# Patient Record
Sex: Male | Born: 1960 | Race: Black or African American | Hispanic: No | Marital: Single | Smoking: Current every day smoker
Health system: Southern US, Community
[De-identification: ages and names within clinical notes are randomized; demographics above are authoritative.]

## PROBLEM LIST (undated history)

## (undated) DIAGNOSIS — M1990 Unspecified osteoarthritis, unspecified site: Secondary | ICD-10-CM

## (undated) DIAGNOSIS — Z8673 Personal history of transient ischemic attack (TIA), and cerebral infarction without residual deficits: Secondary | ICD-10-CM

## (undated) DIAGNOSIS — G8929 Other chronic pain: Secondary | ICD-10-CM

## (undated) DIAGNOSIS — C61 Malignant neoplasm of prostate: Secondary | ICD-10-CM

## (undated) DIAGNOSIS — M4802 Spinal stenosis, cervical region: Secondary | ICD-10-CM

## (undated) DIAGNOSIS — Z91148 Patient's other noncompliance with medication regimen for other reason: Secondary | ICD-10-CM

## (undated) DIAGNOSIS — Z9114 Patient's other noncompliance with medication regimen: Secondary | ICD-10-CM

## (undated) DIAGNOSIS — R350 Frequency of micturition: Secondary | ICD-10-CM

## (undated) DIAGNOSIS — I639 Cerebral infarction, unspecified: Secondary | ICD-10-CM

## (undated) DIAGNOSIS — I10 Essential (primary) hypertension: Secondary | ICD-10-CM

## (undated) DIAGNOSIS — K029 Dental caries, unspecified: Secondary | ICD-10-CM

## (undated) DIAGNOSIS — R51 Headache: Secondary | ICD-10-CM

## (undated) DIAGNOSIS — R519 Headache, unspecified: Secondary | ICD-10-CM

## (undated) HISTORY — PX: PROSTATE BIOPSY: SHX241

## (undated) HISTORY — DX: Cerebral infarction, unspecified: I63.9

## (undated) HISTORY — PX: APPENDECTOMY: SHX54

## (undated) HISTORY — PX: ABDOMINAL EXPLORATION SURGERY: SHX538

---

## 1997-05-20 HISTORY — PX: OTHER SURGICAL HISTORY: SHX169

## 2000-11-06 ENCOUNTER — Emergency Department (HOSPITAL_COMMUNITY): Admission: EM | Admit: 2000-11-06 | Discharge: 2000-11-06 | Payer: Self-pay | Admitting: Emergency Medicine

## 2004-02-06 ENCOUNTER — Emergency Department (HOSPITAL_COMMUNITY): Admission: EM | Admit: 2004-02-06 | Discharge: 2004-02-06 | Payer: Self-pay | Admitting: *Deleted

## 2004-09-20 ENCOUNTER — Emergency Department (HOSPITAL_COMMUNITY): Admission: EM | Admit: 2004-09-20 | Discharge: 2004-09-20 | Payer: Self-pay | Admitting: Emergency Medicine

## 2005-01-13 ENCOUNTER — Emergency Department (HOSPITAL_COMMUNITY): Admission: EM | Admit: 2005-01-13 | Discharge: 2005-01-13 | Payer: Self-pay | Admitting: *Deleted

## 2005-01-17 ENCOUNTER — Emergency Department (HOSPITAL_COMMUNITY): Admission: EM | Admit: 2005-01-17 | Discharge: 2005-01-17 | Payer: Self-pay | Admitting: Emergency Medicine

## 2005-04-16 ENCOUNTER — Emergency Department (HOSPITAL_COMMUNITY): Admission: EM | Admit: 2005-04-16 | Discharge: 2005-04-16 | Payer: Self-pay | Admitting: Emergency Medicine

## 2005-06-11 ENCOUNTER — Emergency Department (HOSPITAL_COMMUNITY): Admission: EM | Admit: 2005-06-11 | Discharge: 2005-06-11 | Payer: Self-pay | Admitting: Family Medicine

## 2005-08-04 ENCOUNTER — Emergency Department (HOSPITAL_COMMUNITY): Admission: EM | Admit: 2005-08-04 | Discharge: 2005-08-04 | Payer: Self-pay | Admitting: Emergency Medicine

## 2009-12-05 ENCOUNTER — Emergency Department (HOSPITAL_COMMUNITY): Admission: EM | Admit: 2009-12-05 | Discharge: 2009-12-05 | Payer: Self-pay | Admitting: Emergency Medicine

## 2012-11-15 ENCOUNTER — Encounter (HOSPITAL_COMMUNITY): Payer: Self-pay | Admitting: Emergency Medicine

## 2012-11-15 ENCOUNTER — Emergency Department (HOSPITAL_COMMUNITY)
Admission: EM | Admit: 2012-11-15 | Discharge: 2012-11-15 | Disposition: A | Discharge status: Home / Self Care | Payer: Self-pay | Attending: Emergency Medicine | Admitting: Emergency Medicine

## 2012-11-15 DIAGNOSIS — L299 Pruritus, unspecified: Secondary | ICD-10-CM

## 2012-11-15 DIAGNOSIS — K921 Melena: Secondary | ICD-10-CM | POA: Insufficient documentation

## 2012-11-15 DIAGNOSIS — K625 Hemorrhage of anus and rectum: Secondary | ICD-10-CM | POA: Insufficient documentation

## 2012-11-15 DIAGNOSIS — M79609 Pain in unspecified limb: Secondary | ICD-10-CM | POA: Insufficient documentation

## 2012-11-15 DIAGNOSIS — F17200 Nicotine dependence, unspecified, uncomplicated: Secondary | ICD-10-CM | POA: Insufficient documentation

## 2012-11-15 LAB — OCCULT BLOOD X 1 CARD TO LAB, STOOL: Fecal Occult Bld: NEGATIVE

## 2012-11-15 MED ORDER — HYDROXYZINE HCL 50 MG PO TABS: 25.0000 mg | ORAL_TABLET | Freq: Four times a day (QID) | ORAL | Status: DC | PRN

## 2012-11-15 MED ORDER — HYDROXYZINE HCL 25 MG PO TABS
50.0000 mg | ORAL_TABLET | Freq: Once | ORAL | Status: AC
  Administered 2012-11-15: 50 mg via ORAL
  Filled 2012-11-15: qty 2

## 2012-11-15 NOTE — ED Notes (Addendum)
Patient states that he stepped on a nail a few days ago. Patient also states that he had blood in his stool x1 this am.

## 2012-11-15 NOTE — ED Provider Notes (Signed)
History    CSN: [id number] Arrival date & time 11/15/12  1820  First MD Initiated Contact with Patient 11/15/12 1953     Chief Complaint  Patient presents with  . Pruritis    entire body  . Foot Pain    stepped on nail with left foot  . Rectal Bleeding   (Consider location/radiation/quality/duration/timing/severity/associated sxs/prior Treatment) HPI Comments: Patient presents complaining of generalized body itching.  He, states he has had a whole body rash for the last 2, weeks, been taking Benadryl, without relief of his itching.  He does work in Aeronautical [employer].  Intermittently.  Painting intermittently.  He also states he stepped on a nail, with his left foot, about a week ago, and he pulled the tick out from under his left arm, he doesn't complain of any, myalgias, headache, dizziness, nausea, chest pain, shortness of breath.  Patient is a 52 y.o. male presenting with lower extremity pain and hematochezia. The history is provided by the patient.  Foot Pain This is a new problem. The current episode started 1 to 4 weeks ago. The problem occurs constantly. The problem has been unchanged. Associated symptoms include a rash. Pertinent negatives include no arthralgias, chest pain, coughing, fever, myalgias, nausea, sore throat or swollen glands. Nothing aggravates the symptoms. Treatments tried: Benadryl. The treatment provided no relief.  Rectal Bleeding Associated symptoms: no dizziness and no fever    History reviewed. No pertinent past medical history. History reviewed. No pertinent past surgical history. No family history on file. History  Substance Use Topics  . Smoking status: Current Every Day Smoker  . Smokeless tobacco: Not on file  . Alcohol Use: Yes    Review of Systems  Constitutional: Negative for fever.  HENT: Negative for sore throat, facial swelling and trouble swallowing.   Respiratory: Negative for cough.   Cardiovascular: Negative for chest pain and leg  swelling.  Gastrointestinal: Positive for hematochezia. Negative for nausea.  Musculoskeletal: Negative for myalgias and arthralgias.  Skin: Positive for rash. Negative for wound.  Neurological: Negative for dizziness.    Allergies  Review of patient's allergies indicates no known allergies.  Home Medications   Current Outpatient Rx  Name  Route  Sig  Dispense  Refill  . diphenhydrAMINE (BENADRYL) 25 mg capsule   Oral   Take 25 mg by mouth at bedtime as needed for sleep.         . hydrOXYzine (ATARAX/VISTARIL) 50 MG tablet   Oral   Take 0.5 tablets (25 mg total) by mouth every 6 (six) hours as needed for itching.   30 tablet   0    BP 158/83  Pulse 96  Temp(Src) 98.6 F (37 C) (Oral)  Resp 20  SpO2 95% Physical Exam  Vitals reviewed. Constitutional: He is oriented to person, place, and time. He appears well-developed and well-nourished.  HENT:  Head: Normocephalic and atraumatic.  Mouth/Throat: Oropharynx is clear and moist.  Eyes: Pupils are equal, round, and reactive to light.  Neck: Neck supple.  Cardiovascular: Normal rate and regular rhythm.   Pulmonary/Chest: Effort normal and breath sounds normal.  Abdominal: Soft. Bowel sounds are normal. He exhibits no distension. There is no tenderness.  Genitourinary: Rectum normal.  Soft stool within the rectal vault.  No obvious blood  Musculoskeletal: Normal range of motion.  There is no evidence of a puncture wound in the bottom at the left or right foot  Neurological: He is oriented to person, place, and time.  Skin: Skin  is warm and dry.  No discernible rash.  The patient has multiple superficial skin irritation from scratching    ED Course  Procedures (including critical care time) Labs Reviewed  OCCULT BLOOD X 1 CARD TO LAB, STOOL   No results found. 1. Itching with irritation     MDM  Will give Atarax for the puritis guaiac test is negative  Arman Filter, NP 11/15/12 2142

## 2012-11-15 NOTE — ED Provider Notes (Signed)
Medical screening examination/treatment/procedure(s) were performed by non-physician practitioner and as supervising physician I was immediately available for consultation/collaboration.   Neeka Urista, MD 11/15/12 2339 

## 2012-11-15 NOTE — ED Notes (Addendum)
Pt reports generalized body itching x 1 -2 weeks. Pt reports stepped on a nail with left foot about 1 week ago. Today pt noticed blood in stool about 1 hour ago. Pt sprayed a persons house for termites about 2 weeks ago. Pt not sure of last tetanus injection. A month ago pt pulled tick from under left arm.

## 2013-03-01 ENCOUNTER — Emergency Department (INDEPENDENT_AMBULATORY_CARE_PROVIDER_SITE_OTHER): Payer: Self-pay

## 2013-03-01 ENCOUNTER — Encounter (HOSPITAL_COMMUNITY): Payer: Self-pay | Admitting: Emergency Medicine

## 2013-03-01 ENCOUNTER — Emergency Department (INDEPENDENT_AMBULATORY_CARE_PROVIDER_SITE_OTHER)
Admission: EM | Admit: 2013-03-01 | Discharge: 2013-03-01 | Disposition: A | Discharge status: Nursing Home (Includes ICF) | Payer: Self-pay | Source: Home / Self Care

## 2013-03-01 ENCOUNTER — Emergency Department (HOSPITAL_COMMUNITY)
Admission: EM | Admit: 2013-03-01 | Discharge: 2013-03-01 | Disposition: A | Discharge status: Home / Self Care | Payer: Self-pay | Attending: Emergency Medicine | Admitting: Emergency Medicine

## 2013-03-01 DIAGNOSIS — R42 Dizziness and giddiness: Secondary | ICD-10-CM | POA: Insufficient documentation

## 2013-03-01 DIAGNOSIS — R0789 Other chest pain: Secondary | ICD-10-CM | POA: Insufficient documentation

## 2013-03-01 DIAGNOSIS — R55 Syncope and collapse: Secondary | ICD-10-CM

## 2013-03-01 DIAGNOSIS — R5381 Other malaise: Secondary | ICD-10-CM | POA: Insufficient documentation

## 2013-03-01 DIAGNOSIS — R079 Chest pain, unspecified: Secondary | ICD-10-CM

## 2013-03-01 DIAGNOSIS — F17200 Nicotine dependence, unspecified, uncomplicated: Secondary | ICD-10-CM | POA: Insufficient documentation

## 2013-03-01 DIAGNOSIS — I498 Other specified cardiac arrhythmias: Secondary | ICD-10-CM | POA: Insufficient documentation

## 2013-03-01 DIAGNOSIS — R51 Headache: Secondary | ICD-10-CM | POA: Insufficient documentation

## 2013-03-01 DIAGNOSIS — R519 Headache, unspecified: Secondary | ICD-10-CM

## 2013-03-01 DIAGNOSIS — Z79899 Other long term (current) drug therapy: Secondary | ICD-10-CM | POA: Insufficient documentation

## 2013-03-01 DIAGNOSIS — I10 Essential (primary) hypertension: Secondary | ICD-10-CM | POA: Insufficient documentation

## 2013-03-01 HISTORY — DX: Essential (primary) hypertension: I10

## 2013-03-01 LAB — MAGNESIUM: Magnesium: 2.2 mg/dL (ref 1.5–2.5)

## 2013-03-01 LAB — POCT I-STAT TROPONIN I: Troponin i, poc: 0 ng/mL (ref 0.00–0.08)

## 2013-03-01 LAB — POCT I-STAT, CHEM 8
BUN: 9 mg/dL (ref 6–23)
Chloride: 102 mEq/L (ref 96–112)
Creatinine, Ser: 1.3 mg/dL (ref 0.50–1.35)
HCT: 50 % (ref 39.0–52.0)
Hemoglobin: 17 g/dL (ref 13.0–17.0)
TCO2: 28 mmol/L (ref 0–100)

## 2013-03-01 LAB — BASIC METABOLIC PANEL
BUN: 9 mg/dL (ref 6–23)
CO2: 30 mEq/L (ref 19–32)
Calcium: 9 mg/dL (ref 8.4–10.5)
Chloride: 99 mEq/L (ref 96–112)
Creatinine, Ser: 1.1 mg/dL (ref 0.50–1.35)
GFR calc Af Amer: 86 mL/min — ABNORMAL LOW (ref >90)
GFR calc non Af Amer: 74 mL/min — ABNORMAL LOW (ref >90)
Glucose, Bld: 109 mg/dL — ABNORMAL HIGH (ref 70–99)
Potassium: 3.8 mEq/L (ref 3.5–5.1)
Sodium: 137 mEq/L (ref 135–145)

## 2013-03-01 LAB — CBC
HCT: 44.4 % (ref 39.0–52.0)
Hemoglobin: 15.9 g/dL (ref 13.0–17.0)
MCH: 33.1 pg (ref 26.0–34.0)
MCHC: 35.8 g/dL (ref 30.0–36.0)
MCV: 92.5 fL (ref 78.0–100.0)
Platelets: 231 10*3/uL (ref 150–400)
RBC: 4.8 MIL/uL (ref 4.22–5.81)
RDW: 13.7 % (ref 11.5–15.5)
WBC: 5.5 10*3/uL (ref 4.0–10.5)

## 2013-03-01 MED ORDER — SODIUM CHLORIDE 0.9 % IV SOLN
Freq: Once | INTRAVENOUS | Status: AC
  Administered 2013-03-01: 11:00:00 via INTRAVENOUS

## 2013-03-01 MED ORDER — SODIUM CHLORIDE 0.9 % IV BOLUS (SEPSIS)
1000.0000 mL | Freq: Once | INTRAVENOUS | Status: AC
  Administered 2013-03-01: 1000 mL via INTRAVENOUS

## 2013-03-01 MED ORDER — DIPHENHYDRAMINE HCL 50 MG/ML IJ SOLN
12.5000 mg | Freq: Once | INTRAMUSCULAR | Status: AC
  Administered 2013-03-01: 12.5 mg via INTRAVENOUS
  Filled 2013-03-01: qty 1

## 2013-03-01 MED ORDER — KETOROLAC TROMETHAMINE 15 MG/ML IJ SOLN
15.0000 mg | Freq: Once | INTRAMUSCULAR | Status: AC
  Administered 2013-03-01: 15 mg via INTRAVENOUS
  Filled 2013-03-01: qty 1

## 2013-03-01 MED ORDER — METOCLOPRAMIDE HCL 5 MG/ML IJ SOLN
10.0000 mg | Freq: Once | INTRAMUSCULAR | Status: AC
  Administered 2013-03-01: 10 mg via INTRAVENOUS
  Filled 2013-03-01: qty 2

## 2013-03-01 NOTE — ED Notes (Signed)
Pt from [facility] for eval of HA, dizziness, bradycardia. Only change is HCTZ started 2 weeks ago. 12 lead unremarkable. 2L O2.  Onset of CP now 4/10.

## 2013-03-01 NOTE — ED Notes (Signed)
Pt triaged and assessed by Provider.  Provider in room before nurse.

## 2013-03-01 NOTE — ED Notes (Signed)
[facility] called , will send truck ASAP

## 2013-03-01 NOTE — ED Provider Notes (Signed)
CSN: [id number]     Arrival date & time 03/01/13  0827 History   None    Chief Complaint  Patient presents with  . Headache   (Consider location/radiation/quality/duration/timing/severity/associated sxs/prior Treatment) HPI Comments: 52 year old male states this morning he awoke and his eyes beginning of father he developed sudden acute headache associated with dizziness and a feeling of impending near syncope. He was started on some new blood pressure medicines 2 weeks ago and he has had similar but less and tense episodes in the morning upon awakening. The medicine he was started on by his PCP was hydrochlorothiazide 25 mg daily. During today's incident he had a 30 to 62nd episode of left anterior chest tightness without radiation. No diaphoresis. He recovered from his symptoms within 5 minutes he states he feels a little weak now.   Past Medical History  Diagnosis Date  . Hypertension    History reviewed. No pertinent past surgical history. History reviewed. No pertinent family history. History  Substance Use Topics  . Smoking status: Current Every Day Smoker -- 0.50 packs/day    Types: Cigarettes  . Smokeless tobacco: Not on file  . Alcohol Use: Yes    Review of Systems  Constitutional: Positive for activity change. Negative for fever and diaphoresis.  Eyes: Positive for visual disturbance.  Respiratory: Positive for chest tightness. Negative for cough, shortness of breath and wheezing.   Cardiovascular: Positive for chest pain. Negative for palpitations and leg swelling.  Gastrointestinal: Negative.   Genitourinary: Negative.   Musculoskeletal:       Chronic left arm pain due to old injury and subsequent surgery.  Skin: Negative.   Neurological: Positive for weakness, light-headedness and headaches. Negative for seizures, syncope and speech difficulty.    Allergies  Review of patient's allergies indicates no known allergies.  Home Medications   Current Outpatient Rx   Name  Route  Sig  Dispense  Refill  . diphenhydrAMINE (BENADRYL) 25 mg capsule   Oral   Take 25 mg by mouth at bedtime as needed for sleep.         . hydrOXYzine (ATARAX/VISTARIL) 50 MG tablet   Oral   Take 0.5 tablets (25 mg total) by mouth every 6 (six) hours as needed for itching.   30 tablet   0    BP 134/85  Pulse 56  Temp(Src) 98.5 F (36.9 C) (Oral)  SpO2 100% Physical Exam  Nursing note and vitals reviewed. Constitutional: He is oriented to person, place, and time. He appears well-developed and well-nourished. No distress.  HENT:  Mouth/Throat: Oropharynx is clear and moist. No oropharyngeal exudate.  Eyes: Conjunctivae and EOM are normal.  Neck: Normal range of motion. Neck supple.  Cardiovascular: Normal rate, regular rhythm and normal heart sounds.   Pulmonary/Chest: Effort normal. He has wheezes.  Scattered wheezes and rhonchi. Patient states he is not having shortness of breath and is in no respiratory distress. He is a smoker not suspect he is at baseline.  Musculoskeletal: He exhibits no edema and no tenderness.  Lymphadenopathy:    He has no cervical adenopathy.  Neurological: He is alert and oriented to person, place, and time. He exhibits normal muscle tone.  Skin: Skin is warm and dry.  Psychiatric: He has a normal mood and affect.    ED Course  Procedures (including critical care time) Labs Review Labs Reviewed  POCT I-STAT, CHEM 8 - Abnormal; Notable for the following:    Glucose, Bld 105 (*)    All  other components within normal limits   Imaging Review Dg Chest 2 View  03/01/2013   CLINICAL DATA:  Chest pain, hypertension, smoker.  EXAM: CHEST  2 VIEW  COMPARISON:  06/11/2005  FINDINGS: The heart size and mediastinal contours are within normal limits. Both lungs are clear. The visualized skeletal structures are unremarkable.  IMPRESSION: No active cardiopulmonary disease.   Electronically Signed   By: Charlett Nose M.D.   On: 03/01/2013 09:17      EKG: Sinus bradycardia at 50 beats per minute. ST elevation in V2, V3, and V5 and V6 most likely due to a strain pattern. MDM   1. Near syncope   2. Chest pain   3. Dizziness      Patient is being transferred to the emergency department for frequent episodes of near syncope where his eyes father of her, develops a headache, dizziness, diaphoresis and chest pain particularly in the morning. He is not found to be orthostatic and is electrolytes are within normal limits. EKG without acute ischemic findings. Etiology of this syndrome is unknown at this time.  Hayden Rasmussen, NP 03/01/13 1026

## 2013-03-01 NOTE — ED Provider Notes (Signed)
Medical screening examination/treatment/procedure(s) were performed by non-physician practitioner and as supervising physician I was immediately available for consultation/collaboration.  Leslee Home, M.D.  Reuben Likes, MD 03/01/13 [phone]

## 2013-03-07 NOTE — ED Provider Notes (Signed)
CSN: [id number]     Arrival date & time 03/01/13  1104 History   First MD Initiated Contact with Patient 03/01/13 1137     Chief Complaint  Patient presents with  . Headache  . Dizziness  . Bradycardia   (Consider location/radiation/quality/duration/timing/severity/associated sxs/prior Treatment) HPI  52yM transfer from urgent care for further evaluation. Pt woke up this morning with a headache. He is currently living in house he is renovating. He initially thought it was from sleeping on hard floor. He went to stand up he began to feel dizziness as if he may pass out and was having some CP. Deep ache in center of chest which last less than a minute then subsided. Did not radiate. No repeat episode. No sob, palpitations or diaphoresis. Since then though he has felt generally tired. Was in usual state of health yesterday. No fever, chills or cough. No n/v. Hx of HTN recently started on HCTZ.   Past Medical History  Diagnosis Date  . Hypertension    No past surgical history on file. No family history on file. History  Substance Use Topics  . Smoking status: Current Every Day Smoker -- 0.50 packs/day    Types: Cigarettes  . Smokeless tobacco: Not on file  . Alcohol Use: Yes    Review of Systems  All systems reviewed and negative, other than as noted in HPI.   Allergies  Review of patient's allergies indicates no known allergies.  Home Medications   Current Outpatient Rx  Name  Route  Sig  Dispense  Refill  . hydrochlorothiazide (HYDRODIURIL) 25 MG tablet   Oral   Take 25 mg by mouth daily.          BP 145/92  Pulse 49  Temp(Src) 97.9 F (36.6 C) (Oral)  Resp 19  SpO2 99% Physical Exam  Nursing note and vitals reviewed. Constitutional: He appears well-developed and well-nourished. No distress.  Laying in bed. NAD.   HENT:  Head: Normocephalic and atraumatic.  Eyes: Conjunctivae are normal. Right eye exhibits no discharge. Left eye exhibits no discharge.  Neck:  Neck supple.  Cardiovascular: Regular rhythm and normal heart sounds.  Exam reveals no gallop and no friction rub.   No murmur heard. bradycardic  Pulmonary/Chest: Effort normal and breath sounds normal. No respiratory distress. He exhibits no tenderness.  Abdominal: Soft. He exhibits no distension. There is no tenderness.  Musculoskeletal: He exhibits no edema and no tenderness.  Lower extremities symmetric as compared to each other. No calf tenderness. Negative Homan's. No palpable cords.   Neurological: He is alert.  Skin: Skin is warm and dry. He is not diaphoretic.  Psychiatric: He has a normal mood and affect. His behavior is normal. Thought content normal.    ED Course  Procedures (including critical care time) Labs Review Labs Reviewed  BASIC METABOLIC PANEL - Abnormal; Notable for the following:    Glucose, Bld 109 (*)    GFR calc non Af Amer 74 (*)    GFR calc Af Amer 86 (*)    All other components within normal limits  CBC  MAGNESIUM  POCT I-STAT TROPONIN I   Imaging Review No results found.  EKG Interpretation   None      EKG:  Rhythm: sinus bradycardia Vent. rate 50 BPM PR interval 136 ms QRS duration 106 ms QT/QTc 440/401 ms P-R-T axes 73 75 62 ST segments: ns st changes   MDM   1. Headache   2. Dizziness  52yM with HA. Suspect primary HA. Consider emergent secondary causes such as bleed, infectious or mass but doubt. There is no history of trauma. Pt has a nonfocal neurological exam. Afebrile and neck supple. No use of blood thinning medication. Consider ocular etiology such as acute angle closure glaucoma but doubt. Pt denies acute change in visual acuity and eye exam unremarkable. Doubt temporal arteritis. No temporal tenderness and temporal artery pulsations palpable. Doubt CO poisoning. No contacts with similar symptoms. Doubt venous thrombosis. Doubt carotid or vertebral arteries dissection. Symptoms improved with meds. Chest pain atypical for  ACS. Laboratory workup was unremarkable. EKG shows bradycardia  without overt ischemic changes. Patient is not sure of what is to call her previous. He is feeling better prior to discharge. He was ambulated were all prior to discharge without any new complaints. I feel that can be safely discharged, but strict return precautions discussed. Outpt fu.     Raeford Razor, MD 03/07/13 [phone]

## 2013-04-30 ENCOUNTER — Encounter (HOSPITAL_COMMUNITY): Payer: Self-pay | Admitting: Emergency Medicine

## 2013-04-30 ENCOUNTER — Emergency Department (HOSPITAL_COMMUNITY): Payer: Self-pay

## 2013-04-30 ENCOUNTER — Emergency Department (HOSPITAL_COMMUNITY)
Admission: EM | Admit: 2013-04-30 | Discharge: 2013-04-30 | Disposition: A | Discharge status: Home / Self Care | Payer: Self-pay | Attending: Emergency Medicine | Admitting: Emergency Medicine

## 2013-04-30 DIAGNOSIS — H81399 Other peripheral vertigo, unspecified ear: Secondary | ICD-10-CM | POA: Insufficient documentation

## 2013-04-30 DIAGNOSIS — Z8739 Personal history of other diseases of the musculoskeletal system and connective tissue: Secondary | ICD-10-CM | POA: Insufficient documentation

## 2013-04-30 DIAGNOSIS — I10 Essential (primary) hypertension: Secondary | ICD-10-CM | POA: Insufficient documentation

## 2013-04-30 DIAGNOSIS — F17200 Nicotine dependence, unspecified, uncomplicated: Secondary | ICD-10-CM | POA: Insufficient documentation

## 2013-04-30 DIAGNOSIS — Z9119 Patient's noncompliance with other medical treatment and regimen: Secondary | ICD-10-CM | POA: Insufficient documentation

## 2013-04-30 DIAGNOSIS — Z79899 Other long term (current) drug therapy: Secondary | ICD-10-CM | POA: Insufficient documentation

## 2013-04-30 DIAGNOSIS — R51 Headache: Secondary | ICD-10-CM | POA: Insufficient documentation

## 2013-04-30 DIAGNOSIS — Z91199 Patient's noncompliance with other medical treatment and regimen due to unspecified reason: Secondary | ICD-10-CM | POA: Insufficient documentation

## 2013-04-30 HISTORY — DX: Unspecified osteoarthritis, unspecified site: M19.90

## 2013-04-30 HISTORY — DX: Patient's other noncompliance with medication regimen for other reason: Z91.148

## 2013-04-30 HISTORY — DX: Patient's other noncompliance with medication regimen: Z91.14

## 2013-04-30 LAB — URINALYSIS W MICROSCOPIC + REFLEX CULTURE
Glucose, UA: NEGATIVE mg/dL
Ketones, ur: NEGATIVE mg/dL
Leukocytes, UA: NEGATIVE
Nitrite: NEGATIVE
Specific Gravity, Urine: 1.018 (ref 1.005–1.030)
pH: 7.5 (ref 5.0–8.0)

## 2013-04-30 LAB — CBC WITH DIFFERENTIAL/PLATELET
Basophils Relative: 1 % (ref 0–1)
Eosinophils Relative: 1 % (ref 0–5)
HCT: 43.9 % (ref 39.0–52.0)
Lymphocytes Relative: 43 % (ref 12–46)
Lymphs Abs: 1.9 10*3/uL (ref 0.7–4.0)
MCV: 93.4 fL (ref 78.0–100.0)
Neutro Abs: 2.2 10*3/uL (ref 1.7–7.7)
Platelets: 233 10*3/uL (ref 150–400)
RBC: 4.7 MIL/uL (ref 4.22–5.81)
RDW: 14.9 % (ref 11.5–15.5)
WBC: 4.5 10*3/uL (ref 4.0–10.5)

## 2013-04-30 LAB — BASIC METABOLIC PANEL
BUN: 11 mg/dL (ref 6–23)
CO2: 25 mEq/L (ref 19–32)
Chloride: 107 mEq/L (ref 96–112)
Creatinine, Ser: 1.04 mg/dL (ref 0.50–1.35)
GFR calc Af Amer: >90 mL/min (ref >90)
GFR calc non Af Amer: 80 mL/min — ABNORMAL LOW (ref >90)
Sodium: 141 mEq/L (ref 135–145)

## 2013-04-30 LAB — TROPONIN I: Troponin I: <0.3 ng/mL (ref <0.30)

## 2013-04-30 MED ORDER — MECLIZINE HCL 25 MG PO TABS: 25.0000 mg | ORAL_TABLET | Freq: Three times a day (TID) | ORAL | Status: DC | PRN

## 2013-04-30 MED ORDER — DIAZEPAM 5 MG PO TABS
5.0000 mg | ORAL_TABLET | Freq: Once | ORAL | Status: AC
  Administered 2013-04-30: 5 mg via ORAL
  Filled 2013-04-30: qty 1

## 2013-04-30 MED ORDER — HYDROCHLOROTHIAZIDE 25 MG PO TABS: 25.0000 mg | ORAL_TABLET | Freq: Every day | ORAL | Status: DC

## 2013-04-30 MED ORDER — MECLIZINE HCL 25 MG PO TABS
50.0000 mg | ORAL_TABLET | Freq: Once | ORAL | Status: AC
  Administered 2013-04-30: 50 mg via ORAL
  Filled 2013-04-30: qty 2

## 2013-04-30 NOTE — ED Notes (Signed)
Pt made aware of need of urine specimen; urinal placed at pt's convienence

## 2013-04-30 NOTE — ED Notes (Signed)
Patient states he got up this am with c/o dizziness and headache , states he was seen one month ago for same and told his b/p was up.

## 2013-04-30 NOTE — ED Notes (Signed)
Pt returned from CT an PBT at bedside.

## 2013-04-30 NOTE — ED Notes (Signed)
Pt undressed, in gown, on continuous pulse oximetry and blood pressure cuff 

## 2013-04-30 NOTE — ED Provider Notes (Signed)
CSN: [id number]     Arrival date & time 04/30/13  1478 History   First MD Initiated Contact with Patient 04/30/13 [phone]     Chief Complaint  Patient presents with  . Headache  . Dizziness    HPI Pt was seen at 0850. Per pt, c/o sudden onset and persistence of constant "dizziness" that began this morning when he woke up. Describes the dizziness as "everything is spinning." Symptoms worsen with moving his head side to side, improve with laying still.  Pt also states he has not been taking his HTN meds in the past 1 month because he "doesn't like the way they make me feel." Cannot further describe what he means. Pt states he was evaluated in the ED 2 months ago for similar symptoms, and was told to take his BP meds. Denies visual changes, no focal motor weakness, no tingling/numbness in extremities, no ataxia, no slurred speech, no facial droop, no CP/palpitations, no SOB/cough, no abd pain, no N/V/D, no fevers, no rash.     Past Medical History  Diagnosis Date  . Hypertension   . Arthritis   . Headache   . Noncompliance with medication regimen    Past Surgical History  Procedure Laterality Date  . Arm surgery Left     History  Substance Use Topics  . Smoking status: Current Every Day Smoker -- 0.50 packs/day    Types: Cigarettes  . Smokeless tobacco: Not on file  . Alcohol Use: Yes    Review of Systems ROS: Statement: All systems negative except as marked or noted in the HPI; Constitutional: Negative for fever and chills. ; ; Eyes: Negative for eye pain, redness and discharge. ; ; ENMT: Negative for ear pain, hoarseness, nasal congestion, sinus pressure and sore throat. ; ; Cardiovascular: Negative for chest pain, palpitations, diaphoresis, dyspnea and peripheral edema. ; ; Respiratory: Negative for cough, wheezing and stridor. ; ; Gastrointestinal: Negative for nausea, vomiting, diarrhea, abdominal pain, blood in stool, hematemesis, jaundice and rectal bleeding. . ; ; Genitourinary:  Negative for dysuria, flank pain and hematuria. ; ; Musculoskeletal: Negative for back pain and neck pain. Negative for swelling and trauma.; ; Skin: Negative for pruritus, rash, abrasions, blisters, bruising and skin lesion.; ; Neuro: +"dizziness." Negative for headache, lightheadedness and neck stiffness. Negative for weakness, altered level of consciousness , altered mental status, extremity weakness, paresthesias, involuntary movement, seizure and syncope.       Allergies  Review of patient's allergies indicates no known allergies.  Home Medications   Current Outpatient Rx  Name  Route  Sig  Dispense  Refill  . hydrochlorothiazide (HYDRODIURIL) 25 MG tablet   Oral   Take 25 mg by mouth daily.          BP 165/88  Pulse 55  Temp(Src) 97.4 F (36.3 C) (Oral)  Resp 17  SpO2 98% Physical Exam 0855: Physical examination:  Nursing notes reviewed; Vital signs and O2 SAT reviewed;  Constitutional: Well developed, Well nourished, Well hydrated, In no acute distress; Head:  Normocephalic, atraumatic; Eyes: EOMI, PERRL, No scleral icterus; ENMT: TM's clear bilat. +edemetous nasal turbinates bilat with clear rhinorrhea. Mouth and pharynx normal, Mucous membranes moist; Neck: Supple, Full range of motion, No lymphadenopathy; Cardiovascular: Regular rate and rhythm, No murmur, rub, or gallop; Respiratory: Breath sounds clear & equal bilaterally, No rales, rhonchi, wheezes.  Speaking full sentences with ease, Normal respiratory effort/excursion; Chest: Nontender, Movement normal; Abdomen: Soft, Nontender, Nondistended, Normal bowel sounds; Genitourinary: No CVA tenderness; Extremities:  Pulses normal, No tenderness, No edema, No calf edema or asymmetry.; Neuro: AA&Ox3, Major CN grossly intact.Speech clear.  No facial droop.  +right horizontal end gaze fatigable nystagmus which reproduces pt's symptoms. Grips equal. Strength 5/5 equal bilat UE's and LE's.  DTR 2/4 equal bilat UE's and LE's.  No gross  sensory deficits.  Normal cerebellar testing bilat UE's (finger-nose) and LE's (heel-shin)..; Skin: Color normal, Warm, Dry.   ED Course  Procedures   EKG Interpretation    Date/Time:  Friday April 30 2013 09:43:31 EST Ventricular Rate:  46 PR Interval:  150 QRS Duration: 94 QT Interval:  442 QTC Calculation: 387 R Axis:   64 Text Interpretation:  Sinus bradycardia Consider left ventricular hypertrophy ST elev, probable normal early repol pattern Since previous tracing 03/01/2013, No significant change was found Confirmed by [facility]  MD, Nicholos Johns [id number]) on 04/30/2013 10:13:52 AM            MDM  MDM Reviewed: previous chart, nursing note and vitals Reviewed previous: labs and ECG Interpretation: labs, ECG, CT scan and x-ray     Results for orders placed during the hospital encounter of 04/30/13  BASIC METABOLIC PANEL      Result Value Range   Sodium 141  135 - 145 mEq/L   Potassium 4.2  3.5 - 5.1 mEq/L   Chloride 107  96 - 112 mEq/L   CO2 25  19 - 32 mEq/L   Glucose, Bld 126 (*) 70 - 99 mg/dL   BUN 11  6 - 23 mg/dL   Creatinine, Ser 9.60  0.50 - 1.35 mg/dL   Calcium 8.6  8.4 - 45.4 mg/dL   GFR calc non Af Amer 80 (*) >90 mL/min   GFR calc Af Amer >90  >90 mL/min  CBC WITH DIFFERENTIAL      Result Value Range   WBC 4.5  4.0 - 10.5 K/uL   RBC 4.70  4.22 - 5.81 MIL/uL   Hemoglobin 15.4  13.0 - 17.0 g/dL   HCT 09.8  11.9 - 14.7 %   MCV 93.4  78.0 - 100.0 fL   MCH 32.8  26.0 - 34.0 pg   MCHC 35.1  30.0 - 36.0 g/dL   RDW 82.9  56.2 - 13.0 %   Platelets 233  150 - 400 K/uL   Neutrophils Relative % 48  43 - 77 %   Neutro Abs 2.2  1.7 - 7.7 K/uL   Lymphocytes Relative 43  12 - 46 %   Lymphs Abs 1.9  0.7 - 4.0 K/uL   Monocytes Relative 7  3 - 12 %   Monocytes Absolute 0.3  0.1 - 1.0 K/uL   Eosinophils Relative 1  0 - 5 %   Eosinophils Absolute 0.1  0.0 - 0.7 K/uL   Basophils Relative 1  0 - 1 %   Basophils Absolute 0.0  0.0 - 0.1 K/uL  TROPONIN I       Result Value Range   Troponin I <0.30  <0.30 ng/mL   Dg Chest 2 View 04/30/2013   CLINICAL DATA:  Chest pain  EXAM: CHEST  2 VIEW  COMPARISON:  03/01/2013  FINDINGS: The heart size and mediastinal contours are within normal limits. Both lungs are clear. The visualized skeletal structures are unremarkable.  IMPRESSION: No active cardiopulmonary disease.   Electronically Signed   By: Salome Holmes M.D.   On: 04/30/2013 09:39   Ct Head Wo Contrast 04/30/2013   CLINICAL DATA:  Dizziness  and headache.  , history of hypertension.  EXAM: CT HEAD WITHOUT CONTRAST  TECHNIQUE: Contiguous axial images were obtained from the base of the skull through the vertex without intravenous contrast.  COMPARISON:  None.  FINDINGS: The ventricles are normal in size and position. There is no intracranial hemorrhage nor intracranial mass effect. There is no evidence of an acute or old ischemic event. There are no abnormal intracranial calcifications. The cerebellum and brainstem are normal in appearance.  At bone window settings the observed portions of the paranasal sinuses and mastoid air cells are clear. There is no acute skull fracture.  IMPRESSION: There is no evidence of an acute intracranial hemorrhage nor of an acute ischemic event. No other acute intracranial abnormality is demonstrated either.   Electronically Signed   By: David  [place]   On: 04/30/2013 09:27    1140:  Pt not orthostatic. Workup reassuring. Neuro exam continues intact. Antivert given without effect. States he continues to feel "dizzy" when attempts to walk with ED staff. Will medicate with valium and order MRI brain r/o CVA.   Mr Brain Wo Contrast 04/30/2013   CLINICAL DATA:  Headache and dizziness  EXAM: MRI HEAD WITHOUT CONTRAST  TECHNIQUE: Multiplanar, multiecho pulse sequences of the brain and surrounding structures were obtained without intravenous contrast.  COMPARISON:  CT 04/30/2013  FINDINGS: Ventricle size is normal. Craniocervical  junction is normal. Pituitary is normal in size.  Negative for acute infarct. No significant chronic ischemic change. Negative for demyelinating disease, cerebral white matter is normal. Brainstem and cerebellum are normal.  Negative for hemorrhage or fluid collection.  No mass or edema.  Paranasal sinuses are clear.  Chronic appearing fracture left medial orbit.  IMPRESSION: Normal MRI of the brain.  Chronic left medial orbital fracture.   Electronically Signed   By: Marlan Palau M.D.   On: 04/30/2013 14:33    1500:  Pt states he feels better after meds and wants to go home now. Pt has now ambulated multiple times around the ED with steady, upright gait, easy resps, NAD. Pt strongly encouraged to take his BP meds. Dx and testing d/w pt.  Questions answered.  Verb understanding, agreeable to d/c home with outpt f/u.   Laray Anger, DO 05/03/13 [phone]

## 2013-04-30 NOTE — ED Notes (Signed)
Pt ambulated down the hall and to the restroom with no difficulty.

## 2013-04-30 NOTE — ED Notes (Signed)
Pt states that when he takes his normal blood pressure pill he feels nauseated. Pt is neurologically intact.

## 2013-04-30 NOTE — ED Notes (Signed)
Pt ambulated from bed to doorway and then began to feel dizzy, pt ambulated back to bed.

## 2013-04-30 NOTE — ED Notes (Signed)
States he used to take b/p medication however it was making him sick went to see his MD was told to take 1/2 pill daily however  Doesn't take it because it makes him sick. No medication in 2 weeks.

## 2013-04-30 NOTE — ED Notes (Signed)
Discharge and follow up instructions reviewed with pt. Pt verbalized understanding.  

## 2013-06-21 ENCOUNTER — Other Ambulatory Visit: Payer: Self-pay | Admitting: Internal Medicine

## 2013-06-21 ENCOUNTER — Ambulatory Visit: Payer: No Typology Code available for payment source | Attending: Internal Medicine

## 2013-06-21 ENCOUNTER — Ambulatory Visit (HOSPITAL_BASED_OUTPATIENT_CLINIC_OR_DEPARTMENT_OTHER): Payer: No Typology Code available for payment source | Admitting: Internal Medicine

## 2013-06-21 ENCOUNTER — Encounter: Payer: Self-pay | Admitting: Internal Medicine

## 2013-06-21 VITALS — BP 171/111 | HR 58 | Temp 97.8°F | Resp 14 | Ht 70.0 in | Wt 224.2 lb

## 2013-06-21 DIAGNOSIS — M25519 Pain in unspecified shoulder: Secondary | ICD-10-CM | POA: Insufficient documentation

## 2013-06-21 DIAGNOSIS — I10 Essential (primary) hypertension: Secondary | ICD-10-CM

## 2013-06-21 LAB — POCT GLYCOSYLATED HEMOGLOBIN (HGB A1C): HEMOGLOBIN A1C: 5.6

## 2013-06-21 MED ORDER — LISINOPRIL 20 MG PO TABS: 20.0000 mg | ORAL_TABLET | Freq: Every day | ORAL | Status: DC

## 2013-06-21 MED ORDER — MELOXICAM 15 MG PO TABS: 15.0000 mg | ORAL_TABLET | Freq: Every day | ORAL | Status: DC

## 2013-06-21 MED ORDER — CYCLOBENZAPRINE HCL 10 MG PO TABS: 10.0000 mg | ORAL_TABLET | Freq: Three times a day (TID) | ORAL | Status: DC | PRN

## 2013-06-21 MED ORDER — HYDROCHLOROTHIAZIDE 25 MG PO TABS: 25.0000 mg | ORAL_TABLET | Freq: Every day | ORAL | Status: DC

## 2013-06-21 NOTE — Progress Notes (Signed)
Pt is here to establish care. Complains of pain in Lt arm, worsens with movement x5 years. No nausea, no vomiting, chronic headaches.

## 2013-06-21 NOTE — Progress Notes (Signed)
Patient ID: Frank Fischer, male   DOB: 03-May-1961, 53 y.o.   MRN: [id number]   CC:  HPI: 53 year old male with a history of hypertension on hydrochlorothiazide, uncontrolled and multiple ED visits for dizziness and headache, had an MRI of the brain on 12/12 that was negative. The patient also complains of left shoulder and left arm pain. He has a history of laceration to the left forearm. Blood pressures in the 258 systolic today, denies chest pain shortness of breath, admits to using marijuana History, smokes a pack a day, drinks alcohol occasionally, smokes marijuana Family history, patient states that his mother has been cured of stage IV lung cancer   No Known Allergies Past Medical History  Diagnosis Date  . Hypertension   . Arthritis   . Headache   . Noncompliance with medication regimen    Current Outpatient Prescriptions on File Prior to Visit  Medication Sig Dispense Refill  . hydrochlorothiazide (HYDRODIURIL) 25 MG tablet Take 25 mg by mouth daily.      . meclizine (ANTIVERT) 25 MG tablet Take 1 tablet (25 mg total) by mouth 3 (three) times daily as needed for dizziness.  15 tablet  0   No current facility-administered medications on file prior to visit.   History reviewed. No pertinent family history. History   Social History  . Marital Status: Single    Spouse Name: N/A    Number of Children: N/A  . Years of Education: N/A   Occupational History  . Not on file.   Social History Main Topics  . Smoking status: Current Every Day Smoker -- 0.50 packs/day    Types: Cigarettes  . Smokeless tobacco: Not on file  . Alcohol Use: Yes  . Drug Use: No  . Sexual Activity: Yes   Other Topics Concern  . Not on file   Social History Narrative  . No narrative on file    Review of Systems  Constitutional: Negative for fever, chills, diaphoresis, activity change, appetite change and fatigue.  HENT: Negative for ear pain, nosebleeds, congestion, facial swelling,  rhinorrhea, neck pain, neck stiffness and ear discharge.   Eyes: Negative for pain, discharge, redness, itching and visual disturbance.  Respiratory: Negative for cough, choking, chest tightness, shortness of breath, wheezing and stridor.   Cardiovascular: Negative for chest pain, palpitations and leg swelling.  Gastrointestinal: Negative for abdominal distention.  Genitourinary: Negative for dysuria, urgency, frequency, hematuria, flank pain, decreased urine volume, difficulty urinating and dyspareunia.  Musculoskeletal: Negative for back pain, joint swelling, arthralgias and gait problem.  Neurological: Negative for dizziness, tremors, seizures, syncope, facial asymmetry, speech difficulty, weakness, light-headedness, numbness and headaches.  Hematological: Negative for adenopathy. Does not bruise/bleed easily.  Psychiatric/Behavioral: Negative for hallucinations, behavioral problems, confusion, dysphoric mood, decreased concentration and agitation.    Objective:   Filed Vitals:   06/21/13 1526  BP: 171/111  Pulse: 58  Temp: 97.8 F (36.6 C)  Resp: 14    Physical Exam  Constitutional: Appears well-developed and well-nourished. No distress.  HENT: Normocephalic. External right and left ear normal. Oropharynx is clear and moist.  Eyes: Conjunctivae and EOM are normal. PERRLA, no scleral icterus.  Neck: Normal ROM. Neck supple. No JVD. No tracheal deviation. No thyromegaly.  CVS: RRR, S1/S2 +, no murmurs, no gallops, no carotid bruit.  Pulmonary: Effort and breath sounds normal, no stridor, rhonchi, wheezes, rales.  Abdominal: Soft. BS +,  no distension, tenderness, rebound or guarding.  Musculoskeletal: Normal range of motion. No edema and no  tenderness.  Lymphadenopathy: No lymphadenopathy noted, cervical, inguinal. Neuro: Alert. Normal reflexes, muscle tone coordination. No cranial nerve deficit. Skin: Skin is warm and dry. No rash noted. Not diaphoretic. No erythema. No pallor.   Psychiatric: Normal mood and affect. Behavior, judgment, thought content normal.   Lab Results  Component Value Date   WBC 4.5 04/30/2013   HGB 15.4 04/30/2013   HCT 43.9 04/30/2013   MCV 93.4 04/30/2013   PLT 233 04/30/2013   Lab Results  Component Value Date   CREATININE 1.04 04/30/2013   BUN 11 04/30/2013   NA 141 04/30/2013   K 4.2 04/30/2013   CL 107 04/30/2013   CO2 25 04/30/2013    No results found for this basename: HGBA1C   Lipid Panel  No results found for this basename: chol, trig, hdl, cholhdl, vldl, ldlcalc       Assessment and plan:   There are no active problems to display for this patient.     Hypertension Uncontrolled Add lisinopril to HCTZ Renal panel  Left shoulder pain Obtain MRI of the left shoulder the neck Sports medicine referral Pain clinic referral Flexeril and meloxicam for pain   Establish care Obtain baseline labs Follow up in 3 months Gastroenterology referral for a colonoscopy     The patient was given clear instructions to go to ER or return to medical center if symptoms don't improve, worsen or new problems develop. The patient verbalized understanding. The patient was told to call to get any lab results if not heard anything in the next week.

## 2013-06-22 ENCOUNTER — Telehealth: Payer: Self-pay | Admitting: *Deleted

## 2013-06-22 LAB — DRUG SCREEN, URINE, NO CONFIRMATION
Amphetamine Screen, Ur: NEGATIVE
Barbiturate Quant, Ur: NEGATIVE
Benzodiazepines.: NEGATIVE
CREATININE, U: 197.4 mg/dL
Cocaine Metabolites: NEGATIVE
METHADONE: NEGATIVE
Marijuana Metabolite: POSITIVE — AB
Opiate Screen, Urine: NEGATIVE
PHENCYCLIDINE (PCP): NEGATIVE
PROPOXYPHENE: NEGATIVE

## 2013-06-22 LAB — CBC WITH DIFFERENTIAL/PLATELET
Basophils Absolute: 0 10*3/uL (ref 0.0–0.1)
Basophils Relative: 0 % (ref 0–1)
Eosinophils Absolute: 0.1 10*3/uL (ref 0.0–0.7)
Eosinophils Relative: 1 % (ref 0–5)
HEMATOCRIT: 44 % (ref 39.0–52.0)
HEMOGLOBIN: 15.2 g/dL (ref 13.0–17.0)
LYMPHS ABS: 2.9 10*3/uL (ref 0.7–4.0)
LYMPHS PCT: 55 % — AB (ref 12–46)
MCH: 31.9 pg (ref 26.0–34.0)
MCHC: 34.5 g/dL (ref 30.0–36.0)
MCV: 92.2 fL (ref 78.0–100.0)
MONO ABS: 0.4 10*3/uL (ref 0.1–1.0)
MONOS PCT: 7 % (ref 3–12)
NEUTROS ABS: 2 10*3/uL (ref 1.7–7.7)
Neutrophils Relative %: 37 % — ABNORMAL LOW (ref 43–77)
Platelets: 276 10*3/uL (ref 150–400)
RBC: 4.77 MIL/uL (ref 4.22–5.81)
RDW: 15 % (ref 11.5–15.5)
WBC: 5.3 10*3/uL (ref 4.0–10.5)

## 2013-06-22 LAB — COMPLETE METABOLIC PANEL WITH GFR
ALBUMIN: 4.9 g/dL (ref 3.5–5.2)
ALT: 18 U/L (ref 0–53)
AST: 15 U/L (ref 0–37)
Alkaline Phosphatase: 77 U/L (ref 39–117)
BUN: 13 mg/dL (ref 6–23)
CO2: 27 mEq/L (ref 19–32)
Calcium: 9.5 mg/dL (ref 8.4–10.5)
Chloride: 106 mEq/L (ref 96–112)
Creat: 0.97 mg/dL (ref 0.50–1.35)
GFR, EST NON AFRICAN AMERICAN: 88 mL/min
GFR, Est African American: >89 mL/min
GLUCOSE: 91 mg/dL (ref 70–99)
POTASSIUM: 4.5 meq/L (ref 3.5–5.3)
Sodium: 140 mEq/L (ref 135–145)
Total Bilirubin: 0.3 mg/dL (ref 0.2–1.2)
Total Protein: 7.1 g/dL (ref 6.0–8.3)

## 2013-06-22 LAB — TSH: TSH: 7.309 u[IU]/mL — ABNORMAL HIGH (ref 0.350–4.500)

## 2013-06-22 NOTE — Telephone Encounter (Signed)
Tried contacting pt. Voicemail is not set up. Unable to leave a voicemail for pt.

## 2013-06-22 NOTE — Telephone Encounter (Signed)
Message copied by Avrie Kedzierski, [place] R on Tue Jun 22, 2013 10:00 AM ------      Message from: Allyson Sabal MD, Ascenc[facility] Dike      Created: Tue Jun 22, 2013  9:10 AM       Notify patient that labs are normal except TSH which is mildly elevated, recommend free T4, free T3 labs, please schedule patient for these labs ASAP ------

## 2013-06-22 NOTE — Telephone Encounter (Signed)
Notify patient that labs are normal except TSH which is mildly elevated, recommend free T4, free T3 labs, please schedule patient for these labs ASAP. Made an appointment for pt to get labs repeated 06/23/13 at 3pm.

## 2013-06-23 ENCOUNTER — Other Ambulatory Visit: Payer: No Typology Code available for payment source

## 2013-06-23 LAB — T4, FREE: Free T4: 0.94 ng/dL (ref 0.80–1.80)

## 2013-06-23 LAB — T3, FREE: T3, Free: 2.7 pg/mL (ref 2.3–4.2)

## 2013-06-28 ENCOUNTER — Telehealth: Payer: Self-pay | Admitting: *Deleted

## 2013-06-28 NOTE — Telephone Encounter (Signed)
Message copied by Kylinn Shropshire, [place] R on Mon Jun 28, 2013 10:27 AM ------      Message from: Allyson Sabal MD, Ascencion Dike      Created: Fri Jun 25, 2013 10:37 AM       Notify patient of the TSH was mildly elevated but the free T4 and T3 is normal. No further recommendations at this time ------

## 2013-07-02 ENCOUNTER — Ambulatory Visit (HOSPITAL_COMMUNITY)
Admission: RE | Admit: 2013-07-02 | Discharge: 2013-07-02 | Disposition: A | Discharge status: Home / Self Care | Payer: No Typology Code available for payment source | Source: Ambulatory Visit | Attending: Internal Medicine | Admitting: Internal Medicine

## 2013-07-02 DIAGNOSIS — I10 Essential (primary) hypertension: Secondary | ICD-10-CM

## 2013-07-02 DIAGNOSIS — M5020 Other cervical disc displacement, unspecified cervical region: Secondary | ICD-10-CM | POA: Insufficient documentation

## 2013-07-02 DIAGNOSIS — IMO0002 Reserved for concepts with insufficient information to code with codable children: Secondary | ICD-10-CM | POA: Insufficient documentation

## 2013-07-02 DIAGNOSIS — M79609 Pain in unspecified limb: Secondary | ICD-10-CM | POA: Insufficient documentation

## 2013-07-02 DIAGNOSIS — M25519 Pain in unspecified shoulder: Secondary | ICD-10-CM | POA: Insufficient documentation

## 2013-07-02 DIAGNOSIS — M4802 Spinal stenosis, cervical region: Secondary | ICD-10-CM | POA: Insufficient documentation

## 2013-07-02 DIAGNOSIS — M19019 Primary osteoarthritis, unspecified shoulder: Secondary | ICD-10-CM | POA: Insufficient documentation

## 2013-07-02 DIAGNOSIS — M47812 Spondylosis without myelopathy or radiculopathy, cervical region: Secondary | ICD-10-CM | POA: Insufficient documentation

## 2013-07-02 DIAGNOSIS — M5030 Other cervical disc degeneration, unspecified cervical region: Secondary | ICD-10-CM | POA: Insufficient documentation

## 2013-07-02 DIAGNOSIS — M542 Cervicalgia: Secondary | ICD-10-CM | POA: Insufficient documentation

## 2013-07-02 DIAGNOSIS — R209 Unspecified disturbances of skin sensation: Secondary | ICD-10-CM | POA: Insufficient documentation

## 2013-07-05 ENCOUNTER — Other Ambulatory Visit: Payer: No Typology Code available for payment source

## 2013-07-07 ENCOUNTER — Ambulatory Visit (INDEPENDENT_AMBULATORY_CARE_PROVIDER_SITE_OTHER): Payer: No Typology Code available for payment source | Admitting: Emergency Medicine

## 2013-07-07 ENCOUNTER — Encounter: Payer: Self-pay | Admitting: Emergency Medicine

## 2013-07-07 VITALS — BP 162/93 | Ht 70.0 in | Wt 224.0 lb

## 2013-07-07 DIAGNOSIS — M719 Bursopathy, unspecified: Secondary | ICD-10-CM

## 2013-07-07 DIAGNOSIS — M67919 Unspecified disorder of synovium and tendon, unspecified shoulder: Secondary | ICD-10-CM

## 2013-07-07 DIAGNOSIS — M4802 Spinal stenosis, cervical region: Secondary | ICD-10-CM | POA: Insufficient documentation

## 2013-07-07 DIAGNOSIS — M7582 Other shoulder lesions, left shoulder: Secondary | ICD-10-CM | POA: Insufficient documentation

## 2013-07-07 MED ORDER — METHYLPREDNISOLONE ACETATE 40 MG/ML IJ SUSP
40.0000 mg | Freq: Once | INTRAMUSCULAR | Status: AC
  Administered 2013-07-07: 40 mg via INTRA_ARTICULAR

## 2013-07-07 NOTE — Addendum Note (Signed)
Addended by: Arnette Schaumann C on: 07/07/2013 03:02 PM   Modules accepted: Orders

## 2013-07-07 NOTE — Assessment & Plan Note (Signed)
I suspect some of his upper extremity pain secondary to spinal stenosis. I feel that he should discuss with his primary care doctor referral to a neurosurgeon for further evaluation.

## 2013-07-07 NOTE — Assessment & Plan Note (Signed)
Corticosteroid injection was given in the office today. In addition he was given a therapy and home exercise program. We'll see him back as needed in 4-6 weeks.

## 2013-07-07 NOTE — Progress Notes (Signed)
Patient ID: Frank Fischer, male   DOB: Apr 18, 1961, 53 y.o.   MRN: [id number] 53 year old male comes in with a complaint of left shoulder pain. Pain radiates down his arm. He also has neck pain. Occasionally has pain in both arms secondary to his neck pain presumably. He has had an MRI of his neck as well as the shoulder. Was referred to see [place] from internal medicine for evaluation of left shoulder pain. He denies any specific injury. He was recently started on Flexeril and Mobic. Mild relief. Pain in her shoulders worse with range of motion.  Pertinent past medical history:  Hypertension, negative for diabetes  Social history: Positive cigarette smoker, positive alcohol  Family history: Hypertension  Examination: BP 162/93  Ht 5\' 10"  (1.778 m)  Wt 224 lb (101.606 kg)  BMI 32.14 kg/m2 Well-developed well-nourished 53 year old [place] American male awake alert oriented no acute distress  Neck: Negative spurling's Full neck range of motion Grip strength and sensation normal in bilateral hands Strength good C4 to T1 distribution No sensory change to C4 to T1 Reflexes normal   Left Shoulder: Inspection reveals no abnormalities, atrophy or asymmetry. Palpation is normal with no tenderness over AC joint or bicipital groove. ROM is limited abduction to 110 degress. Rotator cuff strength limited secondary to pain. Positive Neer and Hawkin's tests, empty can. Speeds and Yergason's tests normal. No labral pathology noted with negative Obrien's, negative clunk and good stability. Normal scapular function observed. Positive painful arc. No apprehension sign  Procedure:  Injection of left subacromial space Consent obtained and verified. Time-out conducted. Noted no overlying erythema, induration, or other signs of local infection. Skin prepped in a sterile fashion. Topical analgesic spray: Ethyl chloride. Completed without difficulty. Meds: 1:3 depomedrol:xylocaine Pain immediately  improved suggesting accurate placement of the medication. Advised to call if fevers/chills, erythema, induration, drainage, or persistent bleeding.  Results of the MRIs were reviewed in the office today. MRI of the shoulders consistent with Rotator cuff tendinitis.  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

## 2013-07-08 ENCOUNTER — Ambulatory Visit: Payer: No Typology Code available for payment source | Attending: Internal Medicine | Admitting: Internal Medicine

## 2013-07-08 ENCOUNTER — Encounter: Payer: Self-pay | Admitting: Internal Medicine

## 2013-07-08 VITALS — BP 145/99 | HR 60 | Temp 98.7°F | Resp 16 | Ht 68.0 in | Wt 222.0 lb

## 2013-07-08 DIAGNOSIS — M5010 Cervical disc disorder with radiculopathy, unspecified cervical region: Secondary | ICD-10-CM

## 2013-07-08 DIAGNOSIS — M5412 Radiculopathy, cervical region: Secondary | ICD-10-CM | POA: Insufficient documentation

## 2013-07-08 DIAGNOSIS — K029 Dental caries, unspecified: Secondary | ICD-10-CM | POA: Insufficient documentation

## 2013-07-08 DIAGNOSIS — M25519 Pain in unspecified shoulder: Secondary | ICD-10-CM | POA: Insufficient documentation

## 2013-07-08 DIAGNOSIS — F17200 Nicotine dependence, unspecified, uncomplicated: Secondary | ICD-10-CM | POA: Insufficient documentation

## 2013-07-08 DIAGNOSIS — I10 Essential (primary) hypertension: Secondary | ICD-10-CM | POA: Insufficient documentation

## 2013-07-08 MED ORDER — LISINOPRIL 40 MG PO TABS: 40.0000 mg | ORAL_TABLET | Freq: Every day | ORAL | Status: DC

## 2013-07-08 MED ORDER — ACETAMINOPHEN-CODEINE #3 300-30 MG PO TABS: 1.0000 | ORAL_TABLET | ORAL | Status: DC | PRN

## 2013-07-08 NOTE — Progress Notes (Signed)
Pt is here today following up on his HTN and chronic left arm pain. Pt reports having frequent headaches.

## 2013-07-08 NOTE — Progress Notes (Signed)
Patient ID: Frank Fischer, male   DOB: Jul 17, 1960, 53 y.o.   MRN: [id number]   Frank Fischer, is a 53 y.o. male  WFU:[id number]  [id number]  DOB - 1960-11-16  Chief Complaint  Patient presents with  . Follow-up        Subjective:   Frank Fischer is a 53 y.o. male here today for a follow up visit. He comes in with a complaint of left shoulder pain. Pain radiates down his arm. He also has neck pain. Occasionally has pain in both arms secondary to his neck pain presumably. He has had an MRI of his neck as well as the shoulder, cervical spine showed some spinal stenosis. He was recently started on Flexeril and Mobic with Mild relief. Pain in his shoulders worse with range of motion. No new complaints, the pain today is the usual pain he has, he thinks he may need something stronger for pain than MOBIC. He smokes half a pack of cigarettes per day and drinks alcohol occasionally. He was seen by the sports medicine recently and was recommended to her for referral to neurosurgery. Patient has No headache, No chest pain, No abdominal pain - No Nausea, No new weakness tingling or numbness, No Cough - SOB.  Problem  Essential Hypertension, Benign  Pain in Joint, Shoulder Region  Cervical Disc Disorder With Radiculopathy of Cervical Region  Dental Caries    ALLERGIES: No Known Allergies  PAST MEDICAL HISTORY: Past Medical History  Diagnosis Date  . Hypertension   . Arthritis   . Headache   . Noncompliance with medication regimen     MEDICATIONS AT HOME: Prior to Admission medications   Medication Sig Start Date End Date Taking? Authorizing Provider  cyclobenzaprine (FLEXERIL) 10 MG tablet Take 1 tablet (10 mg total) by mouth 3 (three) times daily as needed for muscle spasms. 06/21/13  Yes Reyne Dumas, MD  hydrochlorothiazide (HYDRODIURIL) 25 MG tablet Take 1 tablet (25 mg total) by mouth daily. 06/21/13  Yes Reyne Dumas, MD  lisinopril (PRINIVIL,ZESTRIL) 40 MG tablet Take 1 tablet (40  mg total) by mouth daily. 07/08/13  Yes Angelica Chessman, MD  meclizine (ANTIVERT) 25 MG tablet Take 1 tablet (25 mg total) by mouth 3 (three) times daily as needed for dizziness. 04/30/13  Yes Alfonzo Feller, DO  meloxicam (MOBIC) 15 MG tablet Take 1 tablet (15 mg total) by mouth daily. 06/21/13  Yes Reyne Dumas, MD  acetaminophen-codeine (TYLENOL #3) 300-30 MG per tablet Take 1 tablet by mouth every 4 (four) hours as needed. 07/08/13   Angelica Chessman, MD     Objective:   Filed Vitals:   07/08/13 1145 07/08/13 1149  BP: 162/98 145/99  Pulse: 60   Temp: 98.7 F (37.1 C)   TempSrc: Oral   Resp: 16   Height: 5\' 8"  (1.727 m)   Weight: 222 lb (100.699 kg)   SpO2: 96%     Exam General appearance : Awake, alert, not in any distress. Speech Clear. Not toxic looking HEENT: Atraumatic and Normocephalic, pupils equally reactive to light and accomodation Neck: supple, no JVD. No cervical lymphadenopathy.  Chest:Good air entry bilaterally, no added sounds  CVS: S1 S2 regular, no murmurs.  Abdomen: Bowel sounds present, Non tender and not distended with no gaurding, rigidity or rebound. Extremities: Reduced left shoulder range of motion especially abduction. B/L Lower Ext shows no edema, both legs are warm to touch Neurology: Awake alert, and oriented X 3, CN II-XII intact, Non focal Skin:No Rash  Wounds:N/A  Data Review  Assessment & Plan   1. Essential hypertension, benign  - lisinopril (PRINIVIL,ZESTRIL) 40 MG tablet; Take 1 tablet (40 mg total) by mouth daily.  Dispense: 90 tablet; Refill: 3  2. Pain in joint, shoulder region Most likely from tendinitis - acetaminophen-codeine (TYLENOL #3) 300-30 MG per tablet; Take 1 tablet by mouth every 4 (four) hours as needed.  Dispense: 60 tablet; Refill: 0  3. Cervical disc disorder with radiculopathy of cervical region  - Ambulatory referral to Neurosurgery  4. Dental caries  - Ambulatory referral to Dentistry  Patient was  extensively counseled on nutrition and exercise Patient was extensively counseled on smoking cessation  Return in about 3 months (around 10/05/2013), or if symptoms worsen or fail to improve.  The patient was given clear instructions to go to ER or return to medical center if symptoms don't improve, worsen or new problems develop. The patient verbalized understanding. The patient was told to call to get lab results if they haven't heard anything in the next week.    Angelica Chessman, MD, [place], [place], [place] [facility] [place], [place]   07/08/2013, 12:31 PM

## 2013-07-08 NOTE — Patient Instructions (Signed)

## 2013-08-18 ENCOUNTER — Ambulatory Visit (INDEPENDENT_AMBULATORY_CARE_PROVIDER_SITE_OTHER): Payer: No Typology Code available for payment source | Admitting: Emergency Medicine

## 2013-08-18 ENCOUNTER — Telehealth: Payer: Self-pay | Admitting: Internal Medicine

## 2013-08-18 ENCOUNTER — Encounter: Payer: Self-pay | Admitting: Emergency Medicine

## 2013-08-18 VITALS — BP 144/95 | Ht 68.0 in | Wt 222.0 lb

## 2013-08-18 DIAGNOSIS — M5412 Radiculopathy, cervical region: Secondary | ICD-10-CM

## 2013-08-18 DIAGNOSIS — M67919 Unspecified disorder of synovium and tendon, unspecified shoulder: Secondary | ICD-10-CM

## 2013-08-18 DIAGNOSIS — M4802 Spinal stenosis, cervical region: Secondary | ICD-10-CM

## 2013-08-18 DIAGNOSIS — M6790 Unspecified disorder of synovium and tendon, unspecified site: Secondary | ICD-10-CM

## 2013-08-18 DIAGNOSIS — M7582 Other shoulder lesions, left shoulder: Secondary | ICD-10-CM

## 2013-08-18 DIAGNOSIS — M5010 Cervical disc disorder with radiculopathy, unspecified cervical region: Secondary | ICD-10-CM

## 2013-08-18 DIAGNOSIS — M719 Bursopathy, unspecified: Secondary | ICD-10-CM

## 2013-08-18 DIAGNOSIS — M67912 Unspecified disorder of synovium and tendon, left shoulder: Secondary | ICD-10-CM

## 2013-08-18 MED ORDER — PREDNISONE 10 MG PO TABS: ORAL_TABLET | ORAL | Status: DC

## 2013-08-18 NOTE — Assessment & Plan Note (Addendum)
Patient was unable to see a neurosurgeon secondary to financial reasons.  Started on a prednisone Dosepak today. Also given a physical therapy prescription.

## 2013-08-18 NOTE — Telephone Encounter (Signed)
PT. Came in to see doctor, pt. was informed we don't have any appt. available for today. He asked if he could get a refill  (tylenol #3) for his headaches..Pt. also stated he was unable to go to the Neurosurgeon because he cannot afford the $200 copay.

## 2013-08-18 NOTE — Progress Notes (Signed)
   Subjective:    Patient ID: Frank Fischer, male    DOB: [birth year], 53 y.o.   MRN: [id number]  HPI 53 year old male presents for followup of left arm pain secondary to cervical spinal stenosis as well as left shoulder pain secondary to rotator cuff tendinitis both documented by MRI. Today he complains of pain in both regions continue. Corticosteroid injection in the left shoulder does last visit seem to help a little bit but did not incomplete relief of symptoms. He continues to have radicular pain going down into his forearm. He saw his PCP and obtain a referral to a neurosurgeon however, was unable to afford the co-pay the time of the visit and therefore was unable to see the surgeon.  Interval past medical family and social history reviewed and unchanged from prior visit   Review of Systems As per history of present illness otherwise all systems negative    Objective:   Physical Exam  well-developed well-nourished 53 year old [place] male awake alert and oriented no acute distress  Left Shoulder: Inspection reveals no abnormalities, atrophy or asymmetry. Palpation is normal with no tenderness over AC joint or bicipital groove. ROM is slightly limited abduction and forward flexion of the left shoulder Rotator cuff strength normal throughout. Positive signs of impingement with positive Hawkins and Neer Speeds and Yergason's tests normal. No labral pathology noted with negative Obrien's, negative clunk and good stability. Normal scapular function observed. Positive painful ARC No apprehension sign  Cervical spine with no tenderness to palpation.  Positive paraspinal cervical muscle tenderness.      Assessment & Plan:  Patient with continued radicular symptoms secondary to cervical spinal stenosis as well as continued shoulder pain which I suspect some is referred from the spine and some is a component of the rotator cuff tendinopathy. I started him on a prednisone Dosepak  today he notes that this will help temporize/alleviate his symptoms somewhat in addition is given a prescription for physical therapy to help addition to his home exercise routine as well.

## 2013-08-18 NOTE — Assessment & Plan Note (Signed)
Continue home exercise program. Formal physical therapy visit ordered. Continue taking meloxicam. It is too early for repeat injection at this time.

## 2013-08-31 ENCOUNTER — Ambulatory Visit: Payer: No Typology Code available for payment source | Attending: Emergency Medicine | Admitting: Physical Therapy

## 2013-08-31 DIAGNOSIS — M542 Cervicalgia: Secondary | ICD-10-CM | POA: Insufficient documentation

## 2013-08-31 DIAGNOSIS — M25519 Pain in unspecified shoulder: Secondary | ICD-10-CM | POA: Insufficient documentation

## 2013-08-31 DIAGNOSIS — IMO0001 Reserved for inherently not codable concepts without codable children: Secondary | ICD-10-CM | POA: Insufficient documentation

## 2013-09-07 ENCOUNTER — Encounter: Payer: No Typology Code available for payment source | Admitting: Physical Therapy

## 2013-09-08 ENCOUNTER — Ambulatory Visit: Payer: No Typology Code available for payment source | Admitting: Physical Therapy

## 2013-09-09 ENCOUNTER — Encounter: Payer: No Typology Code available for payment source | Admitting: Physical Therapy

## 2013-09-13 ENCOUNTER — Ambulatory Visit: Payer: No Typology Code available for payment source | Admitting: Physical Therapy

## 2013-09-14 ENCOUNTER — Telehealth: Payer: Self-pay | Admitting: *Deleted

## 2013-09-14 NOTE — Telephone Encounter (Signed)
I spoke to the pt and I told him that I would have to call him back this afternoon so I could speak to the doctor about his request.

## 2013-09-15 ENCOUNTER — Ambulatory Visit: Payer: No Typology Code available for payment source | Admitting: Physical Therapy

## 2013-09-15 ENCOUNTER — Encounter: Payer: No Typology Code available for payment source | Admitting: Physical Therapy

## 2013-10-15 ENCOUNTER — Telehealth: Payer: Self-pay | Admitting: Emergency Medicine

## 2013-10-19 ENCOUNTER — Ambulatory Visit: Payer: No Typology Code available for payment source | Attending: Internal Medicine | Admitting: Internal Medicine

## 2013-10-19 ENCOUNTER — Encounter: Payer: Self-pay | Admitting: Internal Medicine

## 2013-10-19 VITALS — BP 150/90 | HR 76 | Temp 98.0°F | Resp 17 | Wt 218.8 lb

## 2013-10-19 DIAGNOSIS — F17200 Nicotine dependence, unspecified, uncomplicated: Secondary | ICD-10-CM

## 2013-10-19 DIAGNOSIS — H579 Unspecified disorder of eye and adnexa: Secondary | ICD-10-CM | POA: Insufficient documentation

## 2013-10-19 DIAGNOSIS — R51 Headache: Secondary | ICD-10-CM

## 2013-10-19 DIAGNOSIS — R519 Headache, unspecified: Secondary | ICD-10-CM | POA: Insufficient documentation

## 2013-10-19 DIAGNOSIS — I10 Essential (primary) hypertension: Secondary | ICD-10-CM

## 2013-10-19 DIAGNOSIS — R030 Elevated blood-pressure reading, without diagnosis of hypertension: Secondary | ICD-10-CM

## 2013-10-19 DIAGNOSIS — L299 Pruritus, unspecified: Secondary | ICD-10-CM

## 2013-10-19 DIAGNOSIS — IMO0001 Reserved for inherently not codable concepts without codable children: Secondary | ICD-10-CM

## 2013-10-19 MED ORDER — CLONIDINE HCL 0.1 MG PO TABS
0.1000 mg | ORAL_TABLET | Freq: Once | ORAL | Status: AC
  Administered 2013-10-19: 0.1 mg via ORAL

## 2013-10-19 MED ORDER — BUTALBITAL-APAP-CAFFEINE 50-325-40 MG PO TABS: 1.0000 | ORAL_TABLET | Freq: Four times a day (QID) | ORAL | Status: DC | PRN

## 2013-10-19 MED ORDER — FEXOFENADINE HCL 180 MG PO TABS: 180.0000 mg | ORAL_TABLET | Freq: Every day | ORAL | Status: DC

## 2013-10-19 MED ORDER — LISINOPRIL 40 MG PO TABS: 40.0000 mg | ORAL_TABLET | Freq: Every day | ORAL | Status: DC

## 2013-10-19 MED ORDER — HYDROCHLOROTHIAZIDE 25 MG PO TABS: 25.0000 mg | ORAL_TABLET | Freq: Every day | ORAL | Status: DC

## 2013-10-19 NOTE — Progress Notes (Signed)
MRN: [id number] Name: Frank Fischer  Sex: male Age: 53 y.o. DOB: 30-Jan-1961  Allergies: Review of patient's allergies indicates no known allergies.  Chief Complaint  Patient presents with  . Follow-up    HPI: Patient is 53 y.o. male who has history of comes today for followup her as per patient he has been taking lisinopril only, EMR reviewed he was prescribed hydrochlorothiazide as well which patient has not been taking his blood pressure was elevated and was given clonidine, patient also has chronic headache had a CAT scan done in December which was negative, patient denies any numbness weakness, patient also reported to have allergy symptoms sometimes itchy eyes as well denies any rash fever chills. Patient also smokes cigarettes, I have advised patient to quit smoking, he's not ready yet. Past Medical History  Diagnosis Date  . Hypertension   . Arthritis   . Headache   . Noncompliance with medication regimen     Past Surgical History  Procedure Laterality Date  . Arm surgery Left       Medication List       This list is accurate as of: 10/19/13 12:53 PM.  Always use your most recent med list.               acetaminophen-codeine 300-30 MG per tablet  Commonly known as:  TYLENOL #3  Take 1 tablet by mouth every 4 (four) hours as needed.     butalbital-acetaminophen-caffeine 50-325-40 MG per tablet  Commonly known as:  ESGIC  Take 1 tablet by mouth every 6 (six) hours as needed for headache.     cyclobenzaprine 10 MG tablet  Commonly known as:  FLEXERIL  Take 1 tablet (10 mg total) by mouth 3 (three) times daily as needed for muscle spasms.     fexofenadine 180 MG tablet  Commonly known as:  ALLEGRA ALLERGY  Take 1 tablet (180 mg total) by mouth daily.     hydrochlorothiazide 25 MG tablet  Commonly known as:  HYDRODIURIL  Take 1 tablet (25 mg total) by mouth daily.     lisinopril 40 MG tablet  Commonly known as:  PRINIVIL,ZESTRIL  Take 1 tablet (40 mg  total) by mouth daily.     meclizine 25 MG tablet  Commonly known as:  ANTIVERT  Take 1 tablet (25 mg total) by mouth 3 (three) times daily as needed for dizziness.     meloxicam 15 MG tablet  Commonly known as:  MOBIC  Take 1 tablet (15 mg total) by mouth daily.     predniSONE 10 MG tablet  Commonly known as:  DELTASONE  Take as directed        Meds ordered this encounter  Medications  . cloNIDine (CATAPRES) tablet 0.1 mg    Sig:   . fexofenadine (ALLEGRA ALLERGY) 180 MG tablet    Sig: Take 1 tablet (180 mg total) by mouth daily.    Dispense:  30 tablet    Refill:  3  . butalbital-acetaminophen-caffeine (ESGIC) 50-325-40 MG per tablet    Sig: Take 1 tablet by mouth every 6 (six) hours as needed for headache.    Dispense:  30 tablet    Refill:  0  . hydrochlorothiazide (HYDRODIURIL) 25 MG tablet    Sig: Take 1 tablet (25 mg total) by mouth daily.    Dispense:  30 tablet    Refill:  6  . lisinopril (PRINIVIL,ZESTRIL) 40 MG tablet    Sig: Take 1 tablet (40  mg total) by mouth daily.    Dispense:  90 tablet    Refill:  3     There is no immunization history on file for this patient.  History reviewed. No pertinent family history.  History  Substance Use Topics  . Smoking status: Current Every Day Smoker -- 0.50 packs/day for 40 years    Types: Cigarettes  . Smokeless tobacco: Not on file  . Alcohol Use: Yes    Review of Systems   As noted in HPI  Filed Vitals:   10/19/13 1246  BP: 150/90  Pulse:   Temp:   Resp:     Physical Exam  Physical Exam  Constitutional: No distress.  Eyes: EOM are normal. Pupils are equal, round, and reactive to light.  Cardiovascular: Normal rate and regular rhythm.   Pulmonary/Chest: Breath sounds normal. No respiratory distress. He has no wheezes. He has no rales.  Musculoskeletal: He exhibits no edema.    CBC    Component Value Date/Time   WBC 5.3 06/21/2013 1559   RBC 4.77 06/21/2013 1559   HGB 15.2 06/21/2013 1559    HCT 44.0 06/21/2013 1559   PLT 276 06/21/2013 1559   MCV 92.2 06/21/2013 1559   LYMPHSABS 2.9 06/21/2013 1559   MONOABS 0.4 06/21/2013 1559   EOSABS 0.1 06/21/2013 1559   BASOSABS 0.0 06/21/2013 1559    CMP     Component Value Date/Time   NA 140 06/21/2013 1559   K 4.5 06/21/2013 1559   CL 106 06/21/2013 1559   CO2 27 06/21/2013 1559   GLUCOSE 91 06/21/2013 1559   BUN 13 06/21/2013 1559   CREATININE 0.97 06/21/2013 1559   CREATININE 1.04 04/30/2013 0938   CALCIUM 9.5 06/21/2013 1559   PROT 7.1 06/21/2013 1559   ALBUMIN 4.9 06/21/2013 1559   AST 15 06/21/2013 1559   ALT 18 06/21/2013 1559   ALKPHOS 77 06/21/2013 1559   BILITOT 0.3 06/21/2013 1559   GFRNONAA 88 06/21/2013 1559   GFRNONAA 80* 04/30/2013 0938   GFRAA >89 06/21/2013 1559   GFRAA >90 04/30/2013 0938    No results found for this basename: chol,  tri,  ldl    No components found with this basename: hga1c    Lab Results  Component Value Date/Time   AST 15 06/21/2013  3:59 PM    Assessment and Plan  Elevated blood pressure - Plan: cloNIDine (CATAPRES) tablet 0.1 mg  Essential hypertension, benign - Plan: I have advised patient for DASH diet continue with her lisinopril, resume back on hydrochlorthiazide hydrochlorothiazide (HYDRODIURIL) 25 MG tablet, lisinopril (PRINIVIL,ZESTRIL) 40 MG tablet  Itching - Plan: Trial of fexofenadine (ALLEGRA ALLERGY) 180 MG tablet  Headache(784.0) - Plan: butalbital-acetaminophen-caffeine (ESGIC) 50-325-40 MG per tablet when necessary  Smoker Have advised patient to quit smoking.     we'll do fasting blood work on the next visit.   Return in about 3 months (around 01/19/2014) for hypertension, BP check in 2 weeks/Nurse Visit.  Lorayne Marek, MD

## 2013-10-19 NOTE — Progress Notes (Signed)
Patient here for follow up Complains of chronic headache that he can not get rid of Itching throughout his whole body Cramping to his hands as well

## 2013-10-19 NOTE — Patient Instructions (Signed)
DASH Diet  The DASH diet stands for "Dietary Approaches to Stop Hypertension." It is a healthy eating plan that has been shown to reduce high blood pressure (hypertension) in as little as 14 days, while also possibly providing other significant health benefits. These other health benefits include reducing the risk of breast cancer after menopause and reducing the risk of type 2 diabetes, heart disease, colon cancer, and stroke. Health benefits also include weight loss and slowing kidney failure in patients with chronic kidney disease.   DIET GUIDELINES  · Limit salt (sodium). Your diet should contain less than 1500 mg of sodium daily.  · Limit refined or processed carbohydrates. Your diet should include mostly whole grains. Desserts and added sugars should be used sparingly.  · Include small amounts of heart-healthy fats. These types of fats include nuts, oils, and tub margarine. Limit saturated and trans fats. These fats have been shown to be harmful in the body.  CHOOSING FOODS   The following food groups are based on a 2000 calorie diet. See your Registered Dietitian for individual calorie needs.  Grains and Grain Products (6 to 8 servings daily)  · Eat More Often: Whole-wheat bread, brown rice, whole-grain or wheat pasta, quinoa, popcorn without added fat or salt (air popped).  · Eat Less Often: White bread, white pasta, white rice, cornbread.  Vegetables (4 to 5 servings daily)  · Eat More Often: Fresh, frozen, and canned vegetables. Vegetables may be raw, steamed, roasted, or grilled with a minimal amount of fat.  · Eat Less Often/Avoid: Creamed or fried vegetables. Vegetables in a cheese sauce.  Fruit (4 to 5 servings daily)  · Eat More Often: All fresh, canned (in natural juice), or frozen fruits. Dried fruits without added sugar. One hundred percent fruit juice (½ cup [237 mL] daily).  · Eat Less Often: Dried fruits with added sugar. Canned fruit in light or heavy syrup.  Lean Meats, Fish, and Poultry (2  servings or less daily. One serving is 3 to 4 oz [85-114 g]).  · Eat More Often: Ninety percent or leaner ground beef, tenderloin, sirloin. Round cuts of beef, chicken breast, turkey breast. All fish. Grill, bake, or broil your meat. Nothing should be fried.  · Eat Less Often/Avoid: Fatty cuts of meat, turkey, or chicken leg, thigh, or wing. Fried cuts of meat or fish.  Dairy (2 to 3 servings)  · Eat More Often: Low-fat or fat-free milk, low-fat plain or light yogurt, reduced-fat or part-skim cheese.  · Eat Less Often/Avoid: Milk (whole, 2%). Whole milk yogurt. Full-fat cheeses.  Nuts, Seeds, and Legumes (4 to 5 servings per week)  · Eat More Often: All without added salt.  · Eat Less Often/Avoid: Salted nuts and seeds, canned beans with added salt.  Fats and Sweets (limited)  · Eat More Often: Vegetable oils, tub margarines without trans fats, sugar-free gelatin. Mayonnaise and salad dressings.  · Eat Less Often/Avoid: Coconut oils, palm oils, butter, stick margarine, cream, half and half, cookies, candy, pie.  FOR MORE INFORMATION  The Dash Diet Eating Plan: www.dashdiet.org  Document Released: 04/25/2011 Document Revised: 07/29/2011 Document Reviewed: 04/25/2011  ExitCare® Patient Information ©2014 [employer].

## 2013-11-17 DEATH — deceased

## 2014-01-19 ENCOUNTER — Ambulatory Visit: Payer: Self-pay | Admitting: Internal Medicine

## 2015-07-20 ENCOUNTER — Encounter (HOSPITAL_COMMUNITY): Payer: Self-pay

## 2015-07-20 ENCOUNTER — Emergency Department (HOSPITAL_COMMUNITY)
Admission: EM | Admit: 2015-07-20 | Discharge: 2015-07-20 | Disposition: A | Discharge status: Home / Self Care | Payer: Medicaid Other | Attending: Emergency Medicine | Admitting: Emergency Medicine

## 2015-07-20 DIAGNOSIS — F17210 Nicotine dependence, cigarettes, uncomplicated: Secondary | ICD-10-CM | POA: Insufficient documentation

## 2015-07-20 DIAGNOSIS — R51 Headache: Secondary | ICD-10-CM | POA: Diagnosis present

## 2015-07-20 DIAGNOSIS — I10 Essential (primary) hypertension: Secondary | ICD-10-CM | POA: Diagnosis not present

## 2015-07-20 NOTE — ED Notes (Signed)
Patient stated he was going to [facility] to be seen

## 2015-07-20 NOTE — ED Notes (Addendum)
Patient here with complaint of general headache that is worse than normal, reports headache daily and has not had BP meds today but often doesn't take because he states makes worse. Alert and oriented, no associated symptoms

## 2016-07-30 ENCOUNTER — Encounter (HOSPITAL_COMMUNITY): Payer: Self-pay | Admitting: Emergency Medicine

## 2016-07-30 ENCOUNTER — Emergency Department (HOSPITAL_COMMUNITY)
Admission: EM | Admit: 2016-07-30 | Discharge: 2016-07-30 | Disposition: A | Discharge status: Home / Self Care | Payer: Medicaid Other | Attending: Emergency Medicine | Admitting: Emergency Medicine

## 2016-07-30 ENCOUNTER — Emergency Department (HOSPITAL_COMMUNITY): Payer: Medicaid Other

## 2016-07-30 DIAGNOSIS — I10 Essential (primary) hypertension: Secondary | ICD-10-CM | POA: Insufficient documentation

## 2016-07-30 DIAGNOSIS — R51 Headache: Secondary | ICD-10-CM | POA: Insufficient documentation

## 2016-07-30 DIAGNOSIS — F17210 Nicotine dependence, cigarettes, uncomplicated: Secondary | ICD-10-CM | POA: Diagnosis not present

## 2016-07-30 DIAGNOSIS — R519 Headache, unspecified: Secondary | ICD-10-CM

## 2016-07-30 MED ORDER — PROMETHAZINE HCL 25 MG/ML IJ SOLN
25.0000 mg | Freq: Once | INTRAMUSCULAR | Status: AC
  Administered 2016-07-30: 25 mg via INTRAMUSCULAR
  Filled 2016-07-30: qty 1

## 2016-07-30 MED ORDER — KETOROLAC TROMETHAMINE 60 MG/2ML IM SOLN
60.0000 mg | Freq: Once | INTRAMUSCULAR | Status: AC
  Administered 2016-07-30: 60 mg via INTRAMUSCULAR
  Filled 2016-07-30: qty 2

## 2016-07-30 NOTE — ED Provider Notes (Signed)
[facility] DEPT Provider Note   CSN: [id number] Arrival date & time: 07/30/16  1733     History   Chief Complaint Chief Complaint  Patient presents with  . Headache    HPI BRENNEN CAMPER is a 56 y.o. male.  Patient is a 56 year old male with history of hypertension. He presents for evaluation of headache. This apparently been ongoing for the past 5 years. He reports not having a headache free day during this period of time. He denies any injury or trauma. Denies any visual disturbances, nausea, fever, or stiff neck.   The history is provided by the patient.  Headache   This is a chronic problem. Episode onset: 5 years ago. The problem occurs constantly. The problem has not changed since onset.The headache is associated with nothing. The pain is located in the left unilateral region. The quality of the pain is described as throbbing. The pain is severe. The pain does not radiate. He has tried nothing for the symptoms. The treatment provided no relief.    Past Medical History:  Diagnosis Date  . Arthritis   . Headache   . Hypertension   . Noncompliance with medication regimen     Patient Active Problem List   Diagnosis Date Noted  . Headache(784.0) 10/19/2013  . Itching 10/19/2013  . Smoker 10/19/2013  . Essential hypertension, benign 07/08/2013  . Pain in joint, shoulder region 07/08/2013  . Cervical disc disorder with radiculopathy of cervical region 07/08/2013  . Dental caries 07/08/2013  . Spinal stenosis in cervical region 07/07/2013  . Tendinitis of left rotator cuff 07/07/2013    Past Surgical History:  Procedure Laterality Date  . arm surgery Left        Home Medications    Prior to Admission medications   Medication Sig Start Date End Date Taking? Authorizing Provider  acetaminophen-codeine (TYLENOL #3) 300-30 MG per tablet Take 1 tablet by mouth every 4 (four) hours as needed. 07/08/13   Tresa Garter, MD  butalbital-acetaminophen-caffeine  (ESGIC) 50-325-40 MG per tablet Take 1 tablet by mouth every 6 (six) hours as needed for headache. 10/19/13   Lorayne Marek, MD  cyclobenzaprine (FLEXERIL) 10 MG tablet Take 1 tablet (10 mg total) by mouth 3 (three) times daily as needed for muscle spasms. 06/21/13   Reyne Dumas, MD  fexofenadine (ALLEGRA ALLERGY) 180 MG tablet Take 1 tablet (180 mg total) by mouth daily. 10/19/13   Lorayne Marek, MD  hydrochlorothiazide (HYDRODIURIL) 25 MG tablet Take 1 tablet (25 mg total) by mouth daily. 10/19/13   Lorayne Marek, MD  lisinopril (PRINIVIL,ZESTRIL) 40 MG tablet Take 1 tablet (40 mg total) by mouth daily. 10/19/13   Lorayne Marek, MD  meclizine (ANTIVERT) 25 MG tablet Take 1 tablet (25 mg total) by mouth 3 (three) times daily as needed for dizziness. 04/30/13   Francine Graven, DO  meloxicam (MOBIC) 15 MG tablet Take 1 tablet (15 mg total) by mouth daily. 06/21/13   Reyne Dumas, MD  predniSONE (DELTASONE) 10 MG tablet Take as directed 08/18/13   Thurman Coyer, DO    Family History No family history on file.  Social History Social History  Substance Use Topics  . Smoking status: Current Every Day Smoker    Packs/day: 0.50    Years: 40.00    Types: Cigarettes  . Smokeless tobacco: Not on file  . Alcohol use Yes     Allergies   Patient has no known allergies.   Review of Systems Review of  Systems  Neurological: Positive for headaches.  All other systems reviewed and are negative.    Physical Exam Updated Vital Signs BP 170/93   Pulse 87   Temp 97.8 F (36.6 C) (Oral)   Resp 16   SpO2 98%   Physical Exam  Constitutional: He is oriented to person, place, and time. He appears well-developed and well-nourished. No distress.  HENT:  Head: Normocephalic and atraumatic.  Mouth/Throat: Oropharynx is clear and moist.  Eyes: EOM are normal. Pupils are equal, round, and reactive to light.  Neck: Normal range of motion. Neck supple.  Cardiovascular: Normal rate and regular rhythm.  Exam  reveals no friction rub.   No murmur heard. Pulmonary/Chest: Effort normal and breath sounds normal. No respiratory distress. He has no wheezes. He has no rales.  Abdominal: Soft. Bowel sounds are normal. He exhibits no distension. There is no tenderness.  Musculoskeletal: Normal range of motion. He exhibits no edema.  Neurological: He is alert and oriented to person, place, and time. No cranial nerve deficit. He exhibits normal muscle tone. Coordination normal.  Skin: Skin is warm and dry. He is not diaphoretic.  Nursing note and vitals reviewed.    ED Treatments / Results  Labs (all labs ordered are listed, but only abnormal results are displayed) Labs Reviewed - No data to display  EKG  EKG Interpretation None       Radiology No results found.  Procedures Procedures (including critical care time)  Medications Ordered in ED Medications  ketorolac (TORADOL) injection 60 mg (not administered)  promethazine (PHENERGAN) injection 25 mg (not administered)     Initial Impression / Assessment and Plan / ED Course  I have reviewed the triage vital signs and the nursing notes.  Pertinent labs & imaging results that were available during my care of the patient were reviewed by me and considered in my medical decision making (see chart for details).  Patient presents with complaints of headache for the past five years. He is neurologically intact and head ct is negative. Nothing here appears acute. He will be discharged with Tylenol/Motrin, and follow-up information for neurology.  Final Clinical Impressions(s) / ED Diagnoses   Final diagnoses:  None    New Prescriptions New Prescriptions   No medications on file     Veryl Speak, MD 07/30/16 2254

## 2016-07-30 NOTE — ED Triage Notes (Signed)
Pt reports chronic headache for years due a pinched nerve in his neck, pt reports he just could not take the pain anymore. Denies any associated symptoms, alert/orientedx4, resp e/u, speech clear, ambulatory to triage.

## 2016-07-30 NOTE — Discharge Instructions (Signed)
Tylenol 1000 mg rotated with ibuprofen 600 mg every 4 hours as needed for pain.  Follow-up with neurology if your headache is not improving.

## 2016-10-17 ENCOUNTER — Ambulatory Visit (INDEPENDENT_AMBULATORY_CARE_PROVIDER_SITE_OTHER): Payer: Medicaid Other | Admitting: Neurology

## 2016-10-17 ENCOUNTER — Encounter (INDEPENDENT_AMBULATORY_CARE_PROVIDER_SITE_OTHER): Payer: Self-pay

## 2016-10-17 ENCOUNTER — Encounter: Payer: Self-pay | Admitting: Neurology

## 2016-10-17 VITALS — BP 133/78 | HR 74 | Ht 70.0 in | Wt 194.4 lb

## 2016-10-17 DIAGNOSIS — R51 Headache with orthostatic component, not elsewhere classified: Secondary | ICD-10-CM

## 2016-10-17 DIAGNOSIS — H539 Unspecified visual disturbance: Secondary | ICD-10-CM | POA: Diagnosis not present

## 2016-10-17 DIAGNOSIS — H0520 Unspecified exophthalmos: Secondary | ICD-10-CM | POA: Diagnosis not present

## 2016-10-17 DIAGNOSIS — G44221 Chronic tension-type headache, intractable: Secondary | ICD-10-CM | POA: Diagnosis not present

## 2016-10-17 DIAGNOSIS — R519 Headache, unspecified: Secondary | ICD-10-CM

## 2016-10-17 NOTE — Progress Notes (Signed)
[facility]    Provider:  Dr Jaynee Eagles Referring Provider: Rogers Blocker, MD Primary Care Physician:  Rogers Blocker, MD  CC:  Headache  HPI:  Frank Fischer is a 56 y.o. male here as a referral from Dr. Marlou Sa for neck pain with headaches. Past medical history of erectile dysfunction, hypertension, left arm numbness, neck pain. He was started on Topamax but stopped it after 2 weeks on his own because it didn't help. He is non-compliant with his medications. He drinks daily. He has had chronic daily headache for 4-5 years. No inciting events or head trauma. Some days its in the front, some days in the back, mostly its on the left side of the head. Daily continuous. Some days it is throbbing. Some days it is more than others. Constant ringing in the ears. No light sensitivity, no nausea, no smell sensitivity. No aura. On average is 8/10 in pain. No daily tylenol or ibuprofen. Nothing helps. He "smokes a lot of marijuana" and drinks daily at least 2 beers. He also smokes. No snoring. Some days he is very tired. No vision changes. He can't afford to see an optometrist but he can buy daily beer and daily marijuana. He has not taken any medications because he hates taking meds and is concerned about side effects.   Tried: Neurontin, Topiramate, flexeril, lisinopril.   Reviewed notes, labs and imaging from outside physicians, which showed:    CT 07/30/2016 showed No acute intracranial abnormalities including mass lesion or mass effect, hydrocephalus, extra-axial fluid collection, midline shift, hemorrhage, or acute infarction, large ischemic events (personally reviewed images)   Review primary care notes. Patient complains of headaches and ringing in his ears. He has not been admitted to the hospital. The pain headway 2 hours a did not see to be seen.  To prevent or relieve headaches, try the following: Cool Compress. Lie down and place a cool compress on your head.  Avoid headache  triggers. If certain foods or odors seem to have triggered your migraines in the past, avoid them. A headache diary might help you identify triggers.  Include physical activity in your daily routine. Try a daily walk or other moderate aerobic exercise.  Manage stress. Find healthy ways to cope with the stressors, such as delegating tasks on your to-do list.  Practice relaxation techniques. Try deep breathing, yoga, massage and visualization.  Eat regularly. Eating regularly scheduled meals and maintaining a healthy diet might help prevent headaches. Also, drink plenty of fluids.  Follow a regular sleep schedule. Sleep deprivation might contribute to headaches Consider biofeedback. With this mind-body technique, you learn to control certain bodily functions - such as muscle tension, heart rate and blood pressure - to prevent headaches or reduce headache pain.    Proceed to emergency room if you experience new or worsening symptoms or symptoms do not resolve, if you have new neurologic symptoms or if headache is severe, or for any concerning symptom.   Provided education and documentation from American headache Society toolbox including articles on: chronic migraine medication overuse headache, chronic migraines, prevention of migraines, behavioral and other nonpharmacologic treatments for headache.   Review of Systems: Patient complains of symptoms per HPI as well as the following symptoms: no CP, no SOB. Pertinent negatives and positives per HPI. All others negative.   Social History   Social History  . Marital status: Single    Spouse name: N/A  . Number of children: 2  . Years of education:  12   Occupational History  . N/A    Social History Main Topics  . Smoking status: Current Every Day Smoker    Packs/day: 0.50    Years: 40.00    Types: Cigarettes  . Smokeless tobacco: Never Used  . Alcohol use 4.2 - 8.4 oz/week    7 - 14 Cans of beer per week     Comment: 1-2 beers every  evening  . Drug use: Yes    Types: Marijuana  . Sexual activity: Yes   Other Topics Concern  . Not on file   Social History Narrative   Lives at home alone   Right-handed   Caffeine: tea throughout the day    Family History  Problem Relation Age of Onset  . Cancer Mother   . Diabetes Paternal Grandfather     Past Medical History:  Diagnosis Date  . Arthritis   . Headache   . Hypertension   . Noncompliance with medication regimen     Past Surgical History:  Procedure Laterality Date  . ABDOMINAL EXPLORATION SURGERY    . arm surgery Left     Current Outpatient Prescriptions  Medication Sig Dispense Refill  . hydrochlorothiazide (HYDRODIURIL) 25 MG tablet Take 1 tablet (25 mg total) by mouth daily. 30 tablet 6  . lisinopril (PRINIVIL,ZESTRIL) 40 MG tablet Take 1 tablet (40 mg total) by mouth daily. 90 tablet 3  . PROVENTIL HFA 108 (90 Base) MCG/ACT inhaler INHALE 1 TO 2 PUFFS Q 6 H PRN  1  . acetaminophen-codeine (TYLENOL #3) 300-30 MG per tablet Take 1 tablet by mouth every 4 (four) hours as needed. (Patient not taking: Reported on 10/17/2016) 60 tablet 0  . butalbital-acetaminophen-caffeine (ESGIC) 50-325-40 MG per tablet Take 1 tablet by mouth every 6 (six) hours as needed for headache. (Patient not taking: Reported on 10/17/2016) 30 tablet 0  . clonazePAM (KLONOPIN) 1 MG tablet TK 1 T PO  TID AS DIRECTED  5  . cyclobenzaprine (FLEXERIL) 10 MG tablet Take 1 tablet (10 mg total) by mouth 3 (three) times daily as needed for muscle spasms. (Patient not taking: Reported on 10/17/2016) 30 tablet 0  . meloxicam (MOBIC) 15 MG tablet Take 1 tablet (15 mg total) by mouth daily. (Patient not taking: Reported on 10/17/2016) 30 tablet 3  . oxyCODONE-acetaminophen (PERCOCET/ROXICET) 5-325 MG tablet TK 1 T PO TID  0  . topiramate (TOPAMAX) 100 MG tablet TK 1 T PO BID  2  . traMADol (ULTRAM) 50 MG tablet TK 1 T PO  Q 6 H PRN  0   No current facility-administered medications for this  visit.     Allergies as of 10/17/2016  . (No Known Allergies)    Vitals: BP 133/78   Pulse 74   Ht 5\' 10"  (1.778 m)   Wt 194 lb 6.4 oz (88.2 kg)   BMI 27.89 kg/m  Last Weight:  Wt Readings from Last 1 Encounters:  10/17/16 194 lb 6.4 oz (88.2 kg)   Last Height:   Ht Readings from Last 1 Encounters:  10/17/16 5\' 10"  (1.778 m)   Physical exam: Exam: Gen: NAD, conversant, well nourised, obese, well groomed                     CV: RRR, no MRG. No Carotid Bruits. No peripheral edema, warm, nontender Eyes: Conjunctivae clear without exudates or hemorrhage  Neuro: Detailed Neurologic Exam  Speech:    Speech is normal; fluent and spontaneous with  normal comprehension.  Cognition:    The patient is oriented to person, place, and time;     recent and remote memory intact;     language fluent;     normal attention, concentration,     fund of knowledge Cranial Nerves:    The pupils are equal, round, and reactive to light. Attempted fundoscopic exam could not visualize due to small pupils.. Visual fields are full to finger confrontation. Extraocular movements are intact. Trigeminal sensation is intact and the muscles of mastication are normal. Right > left proptosis The palate elevates in the midline. Hearing intact. Voice is normal. Shoulder shrug is normal. The tongue has normal motion without fasciculations.   Coordination:    Normal finger to nose and heel to shin. Normal rapid alternating movements.   Gait:    Heel-toe and tandem gait are normal.   Motor Observation:    No asymmetry, no atrophy, and no involuntary movements noted. Tone:    Normal muscle tone.    Posture:    Posture is normal. normal erect    Strength:    Strength is V/V in the upper and lower limbs.      Sensation: intact to LT     Reflex Exam:  DTR's:    1+ uppers. Trace patellars. Absent AJs Toes:    The toes are equiv bilaterally.   Clonus:    Clonus is absent.     Assessment/Plan:    56 y.o. male here as a referral from Dr. Marlou Sa for He has had chronic daily tension-type headache for 4-5 years. Past medical history of erectile dysfunction, hypertension, left arm numbness, neck pain, medication non-compliance, polysubstance abuse. He was started on Topamax but stopped it after 2 weeks on his own because he says it didn't help and he does not like taking pills. He is non-compliant with his medications. He drinks beers daily. He smokes cigarrettes daily. He smoke marijuana daily.  No migrainous features. Chronic daily tension-type headache likely a big contribution from lifestyle of daily alcohol, marijuana and tobacco, no exercise, poor sleep. Will order MRI brain due to new onset headache after the age of 49 to look for lesions or masses but patient needs lifestyle management. He is non compliant with medications and does not want anymore medications. He will be followed up in 8 weeks with carolyn Hassell Done who can disccss lifestyle changes and non-pharmacological treatment of headaches. Also suggest discussing signs or symptoms of sleep apnea to see if patient is a candidate for sleep study. Recommend an eye exam but patient declines due to can't afford it because Medicaid will not pay for an eye exam. Discussed smoking cessation. Needs follow up with pcp to discuss an alcohol cessation plan.   To prevent or relieve headaches, try the following: Cool Compress. Lie down and place a cool compress on your head.  Avoid headache triggers. If certain foods or odors seem to have triggered your migraines in the past, avoid them. A headache diary might help you identify triggers.  Include physical activity in your daily routine. Try a daily walk or other moderate aerobic exercise.  Manage stress. Find healthy ways to cope with the stressors, such as delegating tasks on your to-do list.  Practice relaxation techniques. Try deep breathing, yoga, massage and visualization.  Eat regularly. Eating  regularly scheduled meals and maintaining a healthy diet might help prevent headaches. Also, drink plenty of fluids.  Follow a regular sleep schedule. Sleep deprivation might contribute to headaches Consider  biofeedback. With this mind-body technique, you learn to control certain bodily functions - such as muscle tension, heart rate and blood pressure - to prevent headaches or reduce headache pain.    Proceed to emergency room if you experience new or worsening symptoms or symptoms do not resolve, if you have new neurologic symptoms or if headache is severe, or for any concerning symptom.   Provided education and documentation from American headache Society toolbox including articles on: chronic migraine medication overuse headache, chronic migraines, prevention of migraines, behavioral and other nonpharmacologic treatments for headache.   Cc: Rogers Blocker, MD  Orders Placed This Encounter  Procedures  . MR BRAIN W WO CONTRAST  . Comprehensive metabolic panel  . CBC     Sarina Ill, MD  [facility] [address] [place], [place] [address]  Phone [phone] Fax [phone]

## 2016-10-17 NOTE — Patient Instructions (Signed)
Remember to drink plenty of fluid, eat healthy meals and do not skip any meals. Try to eat protein with a every meal and eat a healthy snack such as fruit or nuts in between meals. Try to keep a regular sleep-wake schedule and try to exercise daily, particularly in the form of walking, 20-30 minutes a day, if you can.   As far as diagnostic testing: MRI brain and labs  I would like to see you back in 8 weeks, sooner if we need to. Please call us with any interim questions, concerns, problems, updates or refill requests.   Our phone number is [phone]. We also have an after hours call service for urgent matters and there is a physician on-call for urgent questions. For any emergencies you know to call 911 or go to the nearest emergency room  To prevent or relieve headaches, try the following: Cool Compress. Lie down and place a cool compress on your head.  Avoid headache triggers. If certain foods or odors seem to have triggered your migraines in the past, avoid them. A headache diary might help you identify triggers.  Include physical activity in your daily routine. Try a daily walk or other moderate aerobic exercise.  Manage stress. Find healthy ways to cope with the stressors, such as delegating tasks on your to-do list.  Practice relaxation techniques. Try deep breathing, yoga, massage and visualization.  Eat regularly. Eating regularly scheduled meals and maintaining a healthy diet might help prevent headaches. Also, drink plenty of fluids.  Follow a regular sleep schedule. Sleep deprivation might contribute to headaches Consider biofeedback. With this mind-body technique, you learn to control certain bodily functions - such as muscle tension, heart rate and blood pressure - to prevent headaches or reduce headache pain.    Proceed to emergency room if you experience new or worsening symptoms or symptoms do not resolve, if you have new neurologic symptoms or if headache is severe, or for  any concerning symptom.   Provided education and documentation from American headache Society toolbox including articles on: chronic migraine medication overuse headache, chronic migraines, prevention of migraines, behavioral and other nonpharmacologic treatments for headache.   Tension Headache A tension headache is pain, pressure, or aching that is felt over the front and sides of your head. These headaches can last from 30 minutes to several days. Follow these instructions at home: Managing pain  Take over-the-counter and prescription medicines only as told by your doctor.  Lie down in a dark, quiet room when you have a headache.  If directed, apply ice to your head and neck area: ? Put ice in a plastic bag. ? Place a towel between your skin and the bag. ? Leave the ice on for 20 minutes, 2-3 times per day.  Use a heating pad or a hot shower to apply heat to your head and neck area as told by your doctor. Eating and drinking  Eat meals on a regular schedule.  Do not drink a lot of alcohol.  Do not use a lot of caffeine, or stop using caffeine. General instructions  Keep all follow-up visits as told by your doctor. This is important.  Keep a journal to find out if certain things bring on headaches. For example, write down: ? What you eat and drink. ? How much sleep you get. ? Any change to your diet or medicines.  Try getting a massage, or doing other things that help you to relax.  Lessen stress.  Sit up  straight. Do not tighten (tense) your muscles.  Do not use tobacco products. This includes cigarettes, chewing tobacco, or e-cigarettes. If you need help quitting, ask your doctor.  Exercise regularly as told by your doctor.  Get enough sleep. This may mean 7-9 hours of sleep. Contact a doctor if:  Your symptoms are not helped by medicine.  You have a headache that feels different from your usual headache.  You feel sick to your stomach (nauseous) or you throw  up (vomit).  You have a fever. Get help right away if:  Your headache becomes very bad.  You keep throwing up.  You have a stiff neck.  You have trouble seeing.  You have trouble speaking.  You have pain in your eye or ear.  Your muscles are weak or you lose muscle control.  You lose your balance or you have trouble walking.  You feel like you will pass out (faint) or you pass out.  You have confusion. This information is not intended to replace advice given to you by your health care provider. Make sure you discuss any questions you have with your health care provider. Document Released: 07/31/2009 Document Revised: 01/04/2016 Document Reviewed: 08/29/2014 Elsevier Interactive Patient Education  [employer].

## 2016-10-18 ENCOUNTER — Telehealth: Payer: Self-pay | Admitting: *Deleted

## 2016-10-18 LAB — CBC
HEMOGLOBIN: 15.7 g/dL (ref 13.0–17.7)
Hematocrit: 46.9 % (ref 37.5–51.0)
MCH: 31.8 pg (ref 26.6–33.0)
MCHC: 33.5 g/dL (ref 31.5–35.7)
MCV: 95 fL (ref 79–97)
Platelets: 241 10*3/uL (ref 150–379)
RBC: 4.93 x10E6/uL (ref 4.14–5.80)
RDW: 15.6 % — ABNORMAL HIGH (ref 12.3–15.4)
WBC: 7.6 10*3/uL (ref 3.4–10.8)

## 2016-10-18 LAB — COMPREHENSIVE METABOLIC PANEL
ALT: 16 IU/L (ref 0–44)
AST: 16 IU/L (ref 0–40)
Albumin/Globulin Ratio: 1.9 (ref 1.2–2.2)
Albumin: 4.4 g/dL (ref 3.5–5.5)
Alkaline Phosphatase: 107 IU/L (ref 39–117)
BUN/Creatinine Ratio: 11 (ref 9–20)
BUN: 14 mg/dL (ref 6–24)
Bilirubin Total: 0.2 mg/dL (ref 0.0–1.2)
CALCIUM: 9.7 mg/dL (ref 8.7–10.2)
CO2: 24 mmol/L (ref 18–29)
CREATININE: 1.31 mg/dL — AB (ref 0.76–1.27)
Chloride: 103 mmol/L (ref 96–106)
GFR calc Af Amer: 69 mL/min/{1.73_m2} (ref >59)
GFR, EST NON AFRICAN AMERICAN: 60 mL/min/{1.73_m2} (ref >59)
GLOBULIN, TOTAL: 2.3 g/dL (ref 1.5–4.5)
Glucose: 79 mg/dL (ref 65–99)
Potassium: 4.9 mmol/L (ref 3.5–5.2)
SODIUM: 144 mmol/L (ref 134–144)
Total Protein: 6.7 g/dL (ref 6.0–8.5)

## 2016-10-18 NOTE — Telephone Encounter (Signed)
-----   Message from Melvenia Beam, MD sent at 10/18/2016  8:16 AM EDT ----- Creatinine is elevated, he needs to follow up with pcp and recheck in a few months. thanks

## 2016-10-18 NOTE — Telephone Encounter (Signed)
Called and spoke with pt about lab results per AA,MD note. Pt verbalized understanding and will f/u with PCP about elevated creatinine.

## 2016-10-20 ENCOUNTER — Encounter: Payer: Self-pay | Admitting: Neurology

## 2016-10-20 DIAGNOSIS — G44229 Chronic tension-type headache, not intractable: Secondary | ICD-10-CM | POA: Insufficient documentation

## 2016-10-31 ENCOUNTER — Ambulatory Visit
Admission: RE | Admit: 2016-10-31 | Discharge: 2016-10-31 | Disposition: A | Discharge status: Home / Self Care | Payer: Medicaid Other | Source: Ambulatory Visit | Attending: Neurology | Admitting: Neurology

## 2016-10-31 DIAGNOSIS — H539 Unspecified visual disturbance: Secondary | ICD-10-CM

## 2016-10-31 DIAGNOSIS — R51 Headache with orthostatic component, not elsewhere classified: Secondary | ICD-10-CM

## 2016-10-31 DIAGNOSIS — H0520 Unspecified exophthalmos: Secondary | ICD-10-CM

## 2016-10-31 DIAGNOSIS — R519 Headache, unspecified: Secondary | ICD-10-CM

## 2016-10-31 MED ORDER — GADOBENATE DIMEGLUMINE 529 MG/ML IV SOLN
15.0000 mL | Freq: Once | INTRAVENOUS | Status: AC | PRN
  Administered 2016-10-31: 15 mL via INTRAVENOUS

## 2016-12-12 ENCOUNTER — Ambulatory Visit: Payer: Medicaid Other | Admitting: Nurse Practitioner

## 2016-12-12 NOTE — Progress Notes (Deleted)
[facility]  PATIENT: Frank Fischer DOB: Sep 25, 1960   REASON FOR VISIT: *** HISTORY FROM:    HISTORY OF PRESENT ILLNESS:Frank Fischer is a 56 y.o. male here as a referral from Dr. Marlou Sa for neck pain with headaches. Past medical history of erectile dysfunction, hypertension, left arm numbness, neck pain. He was started on Topamax but stopped it after 2 weeks on his own because it didn't help. He is non-compliant with his medications. He drinks daily. He has had chronic daily headache for 4-5 years. No inciting events or head trauma. Some days its in the front, some days in the back, mostly its on the left side of the head. Daily continuous. Some days it is throbbing. Some days it is more than others. Constant ringing in the ears. No light sensitivity, no nausea, no smell sensitivity. No aura. On average is 8/10 in pain. No daily tylenol or ibuprofen. Nothing helps. He "smokes a lot of marijuana" and drinks daily at least 2 beers. He also smokes. No snoring. Some days he is very tired. No vision changes. He can't afford to see an optometrist but he can buy daily beer and daily marijuana. He has not taken any medications because he hates taking meds and is concerned about side effects.   Tried: Neurontin, Topiramate, flexeril, lisinopril.   ***  REVIEW OF SYSTEMS: Full 14 system review of systems performed and notable only for those listed, all others are neg:  Constitutional: neg  Cardiovascular: neg Ear/Nose/Throat: neg  Skin: neg Eyes: neg Respiratory: neg Gastroitestinal: neg  Hematology/Lymphatic: neg  Endocrine: neg Musculoskeletal:neg Allergy/Immunology: neg Neurological: neg Psychiatric: neg Sleep : neg   ALLERGIES: No Known Allergies  HOME MEDICATIONS: Outpatient Medications Prior to Visit  Medication Sig Dispense Refill  . acetaminophen-codeine (TYLENOL #3) 300-30 MG per tablet Take 1 tablet by mouth every 4 (four) hours as needed. (Patient  not taking: Reported on 10/17/2016) 60 tablet 0  . butalbital-acetaminophen-caffeine (ESGIC) 50-325-40 MG per tablet Take 1 tablet by mouth every 6 (six) hours as needed for headache. (Patient not taking: Reported on 10/17/2016) 30 tablet 0  . clonazePAM (KLONOPIN) 1 MG tablet TK 1 T PO  TID AS DIRECTED  5  . cyclobenzaprine (FLEXERIL) 10 MG tablet Take 1 tablet (10 mg total) by mouth 3 (three) times daily as needed for muscle spasms. (Patient not taking: Reported on 10/17/2016) 30 tablet 0  . hydrochlorothiazide (HYDRODIURIL) 25 MG tablet Take 1 tablet (25 mg total) by mouth daily. 30 tablet 6  . lisinopril (PRINIVIL,ZESTRIL) 40 MG tablet Take 1 tablet (40 mg total) by mouth daily. 90 tablet 3  . meloxicam (MOBIC) 15 MG tablet Take 1 tablet (15 mg total) by mouth daily. (Patient not taking: Reported on 10/17/2016) 30 tablet 3  . oxyCODONE-acetaminophen (PERCOCET/ROXICET) 5-325 MG tablet TK 1 T PO TID  0  . PROVENTIL HFA 108 (90 Base) MCG/ACT inhaler INHALE 1 TO 2 PUFFS Q 6 H PRN  1  . topiramate (TOPAMAX) 100 MG tablet TK 1 T PO BID  2  . traMADol (ULTRAM) 50 MG tablet TK 1 T PO  Q 6 H PRN  0   No facility-administered medications prior to visit.     PAST MEDICAL HISTORY: Past Medical History:  Diagnosis Date  . Arthritis   . Headache   . Hypertension   . Noncompliance with medication regimen     PAST SURGICAL HISTORY: Past Surgical History:  Procedure Laterality Date  . ABDOMINAL EXPLORATION  SURGERY    . arm surgery Left     FAMILY HISTORY: Family History  Problem Relation Age of Onset  . Cancer Mother   . Diabetes Paternal Grandfather     SOCIAL HISTORY: Social History   Social History  . Marital status: Single    Spouse name: N/A  . Number of children: 2  . Years of education: 12   Occupational History  . N/A    Social History Main Topics  . Smoking status: Current Every Day Smoker    Packs/day: 0.50    Years: 40.00    Types: Cigarettes  . Smokeless tobacco:  Never Used  . Alcohol use 4.2 - 8.4 oz/week    7 - 14 Cans of beer per week     Comment: 1-2 beers every evening  . Drug use: Yes    Types: Marijuana  . Sexual activity: Yes   Other Topics Concern  . Not on file   Social History Narrative   Lives at home alone   Right-handed   Caffeine: tea throughout the day     PHYSICAL EXAM  There were no vitals filed for this visit. There is no height or weight on file to calculate BMI.  Generalized: Well developed, in no acute distress  Head: normocephalic and atraumatic,. Oropharynx benign  Neck: Supple, no carotid bruits  Cardiac: Regular rate rhythm, no murmur  Musculoskeletal: No deformity   Neurological examination   Mentation: Alert oriented to time, place, history taking. Attention span and concentration appropriate. Recent and remote memory intact.  Follows all commands speech and language fluent.   Cranial nerve II-XII: Fundoscopic exam reveals sharp disc margins.Pupils were equal round reactive to light extraocular movements were full, visual field were full on confrontational test. Facial sensation and strength were normal. hearing was intact to finger rubbing bilaterally. Uvula tongue midline. head turning and shoulder shrug were normal and symmetric.Tongue protrusion into cheek strength was normal. Motor: normal bulk and tone, full strength in the BUE, BLE, fine finger movements normal, no pronator drift. No focal weakness Sensory: normal and symmetric to light touch, pinprick, and  Vibration, proprioception  Coordination: finger-nose-finger, heel-to-shin bilaterally, no dysmetria Reflexes: Brachioradialis 2/2, biceps 2/2, triceps 2/2, patellar 2/2, Achilles 2/2, plantar responses were flexor bilaterally. Gait and Station: Rising up from seated position without assistance, normal stance,  moderate stride, good arm swing, smooth turning, able to perform tiptoe, and heel walking without difficulty. Tandem gait is  steady  DIAGNOSTIC DATA (LABS, IMAGING, TESTING) - I reviewed patient records, labs, notes, testing and imaging myself where available.  Lab Results  Component Value Date   WBC 7.6 10/17/2016   HGB 15.7 10/17/2016   HCT 46.9 10/17/2016   MCV 95 10/17/2016   PLT 241 10/17/2016      Component Value Date/Time   NA 144 10/17/2016 1618   K 4.9 10/17/2016 1618   CL 103 10/17/2016 1618   CO2 24 10/17/2016 1618   GLUCOSE 79 10/17/2016 1618   GLUCOSE 91 06/21/2013 1559   BUN 14 10/17/2016 1618   CREATININE 1.31 (H) 10/17/2016 1618   CREATININE 0.97 06/21/2013 1559   CALCIUM 9.7 10/17/2016 1618   PROT 6.7 10/17/2016 1618   ALBUMIN 4.4 10/17/2016 1618   AST 16 10/17/2016 1618   ALT 16 10/17/2016 1618   ALKPHOS 107 10/17/2016 1618   BILITOT 0.2 10/17/2016 1618   GFRNONAA 60 10/17/2016 1618   GFRNONAA 88 06/21/2013 1559   GFRAA 69 10/17/2016 1618  GFRAA >89 06/21/2013 1559   No results found for: CHOL, HDL, LDLCALC, LDLDIRECT, TRIG, CHOLHDL Lab Results  Component Value Date   HGBA1C 5.6 06/21/2013   No results found for: [id number] Lab Results  Component Value Date   TSH 7.309 (H) 06/21/2013    ***  ASSESSMENT AND PLAN  56 y.o. year old male  has a past medical history of Arthritis; Headache; Hypertension; and Noncompliance with medication regimen. here with *** 56 y.o. male here as a referral from Dr. Marlou Sa for He has had chronic daily tension-type headache for 4-5 years. Past medical history of erectile dysfunction, hypertension, left arm numbness, neck pain, medication non-compliance, polysubstance abuse. He was started on Topamax but stopped it after 2 weeks on his own because he says it didn't help and he does not like taking pills. He is non-compliant with his medications. He drinks beers daily. He smokes cigarrettes daily. He smoke marijuana daily.  No migrainous features. Chronic daily tension-type headache likely a big contribution from lifestyle of daily alcohol,  marijuana and tobacco, no exercise, poor sleep. Will order MRI brain due to new onset headache after the age of 73 to look for lesions or masses but patient needs lifestyle management. He is non compliant with medications and does not want anymore medications. He will be followed up in 8 weeks with carolyn Hassell Done who can disccss lifestyle changes and non-pharmacological treatment of headaches. Also suggest discussing signs or symptoms of sleep apnea to see if patient is a candidate for sleep study. Recommend an eye exam but patient declines due to can't afford it because Medicaid will not pay for an eye exam. Discussed smoking cessation. Needs follow up with pcp to discuss an alcohol cessation plan.     Dennie Bible, [facility], [facility], APRN  [facility] [address] [place], [place] [address] [phone]

## 2016-12-17 NOTE — Progress Notes (Signed)
[facility]  PATIENT: Frank Fischer DOB: 11-Mar-1961   REASON FOR VISIT: Follow-up for headaches  HISTORY FROM: Patient    HISTORY OF PRESENT ILLNESS:UPDATE 12/18/2016 Frank Fischer, 56 year old male returns for follow-up with history of neck pain and headaches. He has been noncompliant with his medications he has a history of chronic headaches for at least 5 years he has never had any head trauma. Sometimes his headache is in the front of the head and sometimes in the back. He has a history of vertigo. Patient is  a poor historian. He also complains of shoulder pain. Imaging done of his shoulder in 2015 showed moderate degenerative disease. MRI of the brain done 11/01/2016 shows 2 small chronic lacunar infarctions that were not present on MRI dated 04/30/2013 there are no acute findings. Patient does not know his father's medical history. His mother died of cancer. His aunt recently  had a stroke. Blood pressure in the office today 130/80 and patient does say he takes his Lisinopril. Recent CBC and comprehensive metabolic profile with elevated creatinine level. Patient was told he needed to follow-up with his primary care regarding this .he continues to smoke a pack of cigarettes a day past 3-4 beers and smokes marijuana on a daily basis. He does not have a normal sleep-wake cycle. He returns for reevaluation   HISTORY 10/17/16 Frank Fischer is a 56 y.o. male here as a referral from Dr. Marlou Sa for neck pain with headaches. Past medical history of erectile dysfunction, hypertension, left arm numbness, neck pain. He was started on Topamax but stopped it after 2 weeks on his own because it didn't help. He is non-compliant with his medications. He drinks daily. He has had chronic daily headache for 4-5 years. No inciting events or head trauma. Some days its in the front, some days in the back, mostly its on the left side of the head. Daily continuous. Some days it is throbbing. Some  days it is more than others. Constant ringing in the ears. No light sensitivity, no nausea, no smell sensitivity. No aura. On average is 8/10 in pain. No daily tylenol or ibuprofen. Nothing helps. He "smokes a lot of marijuana" and drinks daily at least 2 beers. He also smokes. No snoring. Some days he is very tired. No vision changes. He can't afford to see an optometrist but he can buy daily beer and daily marijuana. He has not taken any medications because he hates taking meds and is concerned about side effects.     REVIEW OF SYSTEMS: Full 14 system review of systems performed and notable only for those listed, all others are neg:  Constitutional: neg  Cardiovascular: neg Ear/Nose/Throat: neg  Skin: neg Eyes: neg Respiratory: neg Gastroitestinal: neg  Hematology/Lymphatic: neg  Endocrine: neg Musculoskeletal:neg Allergy/Immunology: neg Neurological: neg Psychiatric: neg Sleep : neg   ALLERGIES: No Known Allergies  HOME MEDICATIONS: Outpatient Medications Prior to Visit  Medication Sig Dispense Refill  . lisinopril (PRINIVIL,ZESTRIL) 40 MG tablet Take 1 tablet (40 mg total) by mouth daily. 90 tablet 3  . butalbital-acetaminophen-caffeine (ESGIC) 50-325-40 MG per tablet Take 1 tablet by mouth every 6 (six) hours as needed for headache. (Patient not taking: Reported on 10/17/2016) 30 tablet 0  . clonazePAM (KLONOPIN) 1 MG tablet TK 1 T PO  TID AS DIRECTED  5  . cyclobenzaprine (FLEXERIL) 10 MG tablet Take 1 tablet (10 mg total) by mouth 3 (three) times daily as needed for muscle spasms. (Patient not taking:  Reported on 10/17/2016) 30 tablet 0  . hydrochlorothiazide (HYDRODIURIL) 25 MG tablet Take 1 tablet (25 mg total) by mouth daily. (Patient not taking: Reported on 12/18/2016) 30 tablet 6  . meloxicam (MOBIC) 15 MG tablet Take 1 tablet (15 mg total) by mouth daily. (Patient not taking: Reported on 10/17/2016) 30 tablet 3  . oxyCODONE-acetaminophen (PERCOCET/ROXICET) 5-325 MG tablet TK  1 T PO TID  0  . PROVENTIL HFA 108 (90 Base) MCG/ACT inhaler INHALE 1 TO 2 PUFFS Q 6 H PRN  1  . topiramate (TOPAMAX) 100 MG tablet TK 1 T PO BID  2  . traMADol (ULTRAM) 50 MG tablet TK 1 T PO  Q 6 H PRN  0  . acetaminophen-codeine (TYLENOL #3) 300-30 MG per tablet Take 1 tablet by mouth every 4 (four) hours as needed. (Patient not taking: Reported on 10/17/2016) 60 tablet 0   No facility-administered medications prior to visit.     PAST MEDICAL HISTORY: Past Medical History:  Diagnosis Date  . Arthritis   . Headache   . Hypertension   . Noncompliance with medication regimen     PAST SURGICAL HISTORY: Past Surgical History:  Procedure Laterality Date  . ABDOMINAL EXPLORATION SURGERY    . arm surgery Left     FAMILY HISTORY: Family History  Problem Relation Age of Onset  . Cancer Mother   . Diabetes Paternal Grandfather     SOCIAL HISTORY: Social History   Social History  . Marital status: Single    Spouse name: N/A  . Number of children: 2  . Years of education: 12   Occupational History  . N/A    Social History Main Topics  . Smoking status: Current Every Day Smoker    Packs/day: 0.50    Years: 40.00    Types: Cigarettes  . Smokeless tobacco: Never Used  . Alcohol use 4.2 - 8.4 oz/week    7 - 14 Cans of beer per week     Comment: 1-2 beers every evening  . Drug use: Yes    Types: Marijuana  . Sexual activity: Yes   Other Topics Concern  . Not on file   Social History Narrative   Lives at home alone   Right-handed   Caffeine: tea throughout the day     PHYSICAL EXAM  Vitals:   12/18/16 1301  BP: 130/80  Pulse: 64  Weight: 195 lb 3.2 oz (88.5 kg)   Body mass index is 28.01 kg/m.  Generalized: Well developed, in no acute distress  Head: normocephalic and atraumatic,. Oropharynx benign  Neck: Supple, no carotid bruits  Cardiac: Regular rate rhythm, no murmur  Musculoskeletal: No deformity   Neurological examination   Mentation: Alert  oriented to time, place, history taking. Attention span and concentration appropriate. Recent and remote memory intact.  Follows all commands speech and language fluent.   Cranial nerve II-XII: Pupils were equal round reactive to light extraocular movements were full, visual field were full on confrontational test. Facial sensation and strength were normal. hearing was intact to finger rubbing bilaterally. Uvula tongue midline. head turning and shoulder shrug were normal and symmetric.Tongue protrusion into cheek strength was normal. Motor: normal bulk and tone, full strength in the BUE, BLE, fine finger movements normal, no pronator drift. No focal weakness Sensory: normal and symmetric to light touch, pinprick, and  Vibration, In the upper and lower extremities Coordination: finger-nose-finger, heel-to-shin bilaterally, no dysmetria, no tremor Reflexes: 1+ in the upper extremities and trace  patellar absent ankle jerks plantar responses were flexor bilaterally. Gait and Station: Rising up from seated position without assistance, normal stance,  moderate stride, good arm swing, smooth turning, able to perform tiptoe, and heel walking without difficulty. Tandem gait is steady. Romberg negative   DIAGNOSTIC DATA (LABS, IMAGING, TESTING) - I reviewed patient records, labs, notes, testing and imaging myself where available.  Lab Results  Component Value Date   WBC 7.6 10/17/2016   HGB 15.7 10/17/2016   HCT 46.9 10/17/2016   MCV 95 10/17/2016   PLT 241 10/17/2016      Component Value Date/Time   NA 144 10/17/2016 1618   K 4.9 10/17/2016 1618   CL 103 10/17/2016 1618   CO2 24 10/17/2016 1618   GLUCOSE 79 10/17/2016 1618   GLUCOSE 91 06/21/2013 1559   BUN 14 10/17/2016 1618   CREATININE 1.31 (H) 10/17/2016 1618   CREATININE 0.97 06/21/2013 1559   CALCIUM 9.7 10/17/2016 1618   PROT 6.7 10/17/2016 1618   ALBUMIN 4.4 10/17/2016 1618   AST 16 10/17/2016 1618   ALT 16 10/17/2016 1618    ALKPHOS 107 10/17/2016 1618   BILITOT 0.2 10/17/2016 1618   GFRNONAA 60 10/17/2016 1618   GFRNONAA 88 06/21/2013 1559   GFRAA 69 10/17/2016 1618   GFRAA >89 06/21/2013 1559   No results found for: CHOL, HDL, LDLCALC, LDLDIRECT, TRIG, CHOLHDL Lab Results  Component Value Date   HGBA1C 5.6 06/21/2013   No results found for: VITAMINB12 Lab Results  Component Value Date   TSH 7.309 (H) 06/21/2013      ASSESSMENT AND PLAN 56 y.o. male here for  chronic daily tension-type headache for 4-5 years. Past medical history of erectile dysfunction, hypertension, left arm numbness, neck pain, medication non-compliance, polysubstance abuse. He was started on Topamax but stopped it after 2 weeks on his own because he says it didn't help and he does not like taking pills. He is non-compliant with his medications. He drinks beers daily. He smokes cigarrettes daily. He smoke marijuana daily.He is not interested in lifestyle change.No migrainous features with chronic headaches therefore likely a combination of alcohol or tobacco no exercise and poor sleep habits. MRI of the brain done 11/01/2016 shows 2 small chronic lacunar infarctions that were not present on MRI dated 04/30/2013 there are no acute findings. CMP shows elevated creatinine     Nonpharmacologic approaches to headache management  To prevent or relieve headaches, try the following:  Cool Compress. Lie down and place a cool compress on your head.   Avoid headache triggers. If certain foods or odors seem to have triggered your migraines in the past, avoid them. A headache diary might help you identify triggers.   Include physical activity in your daily routine. Try a daily walk or other moderate aerobic exercise.   Manage stress. Find healthy ways to cope with the stressors, such as delegating tasks on your to-do list.   Practice relaxation techniques. Try deep breathing, yoga, massage and visualization.   Eat regularly. Eating  regularly scheduled meals and maintaining a healthy diet might help prevent headaches. Also, drink plenty of fluids.   Follow a regular sleep schedule. Sleep deprivation might contribute to headaches  Consider biofeedback. With this mind-body technique, you learn to control certain bodily functions - such as muscle tension, heart rate and blood pressure - to prevent headaches or reduce headache pain.   Patient was given a list of foods that commonly cause  headaches   patient was  made aware of his MRI results that he should follow up with his primary care for management of risk factors of stroke suggest a baby aspirin daily Follow-up when necessary only as I have no other suggestions for his chronic headaches if he is not willing to at least try medications Dennie Bible, [facility], [facility], APRN  [facility] [address] [place], [place] [address] [phone]

## 2016-12-18 ENCOUNTER — Encounter (INDEPENDENT_AMBULATORY_CARE_PROVIDER_SITE_OTHER): Payer: Self-pay

## 2016-12-18 ENCOUNTER — Encounter: Payer: Self-pay | Admitting: Nurse Practitioner

## 2016-12-18 ENCOUNTER — Ambulatory Visit (INDEPENDENT_AMBULATORY_CARE_PROVIDER_SITE_OTHER): Payer: Medicaid Other | Admitting: Nurse Practitioner

## 2016-12-18 VITALS — BP 130/80 | HR 64 | Wt 195.2 lb

## 2016-12-18 DIAGNOSIS — G44221 Chronic tension-type headache, intractable: Secondary | ICD-10-CM

## 2016-12-18 DIAGNOSIS — Z9114 Patient's other noncompliance with medication regimen: Secondary | ICD-10-CM | POA: Diagnosis not present

## 2016-12-18 DIAGNOSIS — M4802 Spinal stenosis, cervical region: Secondary | ICD-10-CM

## 2016-12-18 NOTE — Patient Instructions (Signed)
To prevent or relieve headaches, try the following:  Cool Compress. Lie down and place a cool compress on your head.   Avoid headache triggers. If certain foods or odors seem to have triggered your migraines in the past, avoid them. A headache diary might help you identify triggers.   Include physical activity in your daily routine. Try a daily walk or other moderate aerobic exercise.   Manage stress. Find healthy ways to cope with the stressors, such as delegating tasks on your to-do list.   Practice relaxation techniques. Try deep breathing, yoga, massage and visualization.   Eat regularly. Eating regularly scheduled meals and maintaining a healthy diet might help prevent headaches. Also, drink plenty of fluids.   Follow a regular sleep schedule. Sleep deprivation might contribute to headaches  Consider biofeedback. With this mind-body technique, you learn to control certain bodily functions - such as muscle tension, heart rate and blood pressure - to prevent headaches or reduce headache pain.   Given list of foods that cause headaches F/U prn

## 2017-01-25 NOTE — Progress Notes (Signed)
Personally  participated in, made any corrections needed, and agree with history, physical, neuro exam,assessment and plan as stated.     Maricella Filyaw, MD [facility]     

## 2017-04-21 ENCOUNTER — Ambulatory Visit: Payer: Medicaid Other | Admitting: Nurse Practitioner

## 2017-05-16 ENCOUNTER — Emergency Department (HOSPITAL_COMMUNITY)
Admission: EM | Admit: 2017-05-16 | Discharge: 2017-05-16 | Disposition: A | Discharge status: Home / Self Care | Payer: Medicaid Other | Attending: Emergency Medicine | Admitting: Emergency Medicine

## 2017-05-16 ENCOUNTER — Encounter (HOSPITAL_COMMUNITY): Payer: Self-pay | Admitting: Emergency Medicine

## 2017-05-16 ENCOUNTER — Other Ambulatory Visit: Payer: Self-pay

## 2017-05-16 DIAGNOSIS — F17210 Nicotine dependence, cigarettes, uncomplicated: Secondary | ICD-10-CM | POA: Diagnosis not present

## 2017-05-16 DIAGNOSIS — M79632 Pain in left forearm: Secondary | ICD-10-CM | POA: Insufficient documentation

## 2017-05-16 DIAGNOSIS — S0990XA Unspecified injury of head, initial encounter: Secondary | ICD-10-CM

## 2017-05-16 DIAGNOSIS — I10 Essential (primary) hypertension: Secondary | ICD-10-CM | POA: Insufficient documentation

## 2017-05-16 DIAGNOSIS — Y990 Civilian activity done for income or pay: Secondary | ICD-10-CM | POA: Insufficient documentation

## 2017-05-16 DIAGNOSIS — W208XXA Other cause of strike by thrown, projected or falling object, initial encounter: Secondary | ICD-10-CM | POA: Insufficient documentation

## 2017-05-16 DIAGNOSIS — Z79899 Other long term (current) drug therapy: Secondary | ICD-10-CM | POA: Diagnosis not present

## 2017-05-16 DIAGNOSIS — M79602 Pain in left arm: Secondary | ICD-10-CM | POA: Diagnosis present

## 2017-05-16 DIAGNOSIS — Y9259 Other trade areas as the place of occurrence of the external cause: Secondary | ICD-10-CM | POA: Insufficient documentation

## 2017-05-16 DIAGNOSIS — G44229 Chronic tension-type headache, not intractable: Secondary | ICD-10-CM | POA: Insufficient documentation

## 2017-05-16 DIAGNOSIS — Y9389 Activity, other specified: Secondary | ICD-10-CM | POA: Diagnosis not present

## 2017-05-16 LAB — I-STAT TROPONIN, ED: Troponin i, poc: 0 ng/mL (ref 0.00–0.08)

## 2017-05-16 MED ORDER — KETOROLAC TROMETHAMINE 30 MG/ML IJ SOLN
30.0000 mg | Freq: Once | INTRAMUSCULAR | Status: AC
  Administered 2017-05-16: 30 mg via INTRAVENOUS
  Filled 2017-05-16: qty 1

## 2017-05-16 MED ORDER — SODIUM CHLORIDE 0.9 % IV BOLUS (SEPSIS)
1000.0000 mL | Freq: Once | INTRAVENOUS | Status: AC
  Administered 2017-05-16: 1000 mL via INTRAVENOUS

## 2017-05-16 MED ORDER — PROCHLORPERAZINE EDISYLATE 5 MG/ML IJ SOLN
10.0000 mg | Freq: Once | INTRAMUSCULAR | Status: AC
  Administered 2017-05-16: 10 mg via INTRAVENOUS
  Filled 2017-05-16: qty 2

## 2017-05-16 MED ORDER — DIPHENHYDRAMINE HCL 50 MG/ML IJ SOLN
25.0000 mg | Freq: Once | INTRAMUSCULAR | Status: AC
Start: 2017-05-16 — End: 2017-05-16
  Administered 2017-05-16: 25 mg via INTRAVENOUS
  Filled 2017-05-16: qty 1

## 2017-05-16 NOTE — ED Provider Notes (Signed)
[facility] DEPT Provider Note   CSN: [id number] Arrival date & time: 05/16/17  0606     History   Chief Complaint Chief Complaint  Patient presents with  . Arm Pain  . Head Injury    HPI Frank Fischer is a 56 y.o. male with history of hypertension, arthritis, chronic tension type headache, cervical radiculopathy, and joint pain as well as history  of noncompliance with medication regimen presents today with chief complaint acute onset, constant left arm pain. Pain in the left arm radiates from the elbow to the fingertips.  States this feels like similar pain he had in the past.  Endorses intermittent numbness and tingling of his fingertips which is been ongoing for several months but denies at this time.  Denies chest pain but states "occasionally my heart skips a beat".  States this has been occurring for several years and he with his primary care physician which has been negative.  Denies shortness of breath.  Blood pressure is elevated today, but patient states he has missed several doses of his hypertension medicine for the past several days.  He states that yesterday while at work a 2 x 4 piece of wood fell on his head from a short distance.  He notes a "bump" to the top of his head.  He denies loss of consciousness, nausea, vomiting, altered mental status, vision changes, or amnesia to th he denies any pain to the region.  He states he has a chronice  headache which is generalized and radiates to the neck intermittently.  He states this headache is unchanged from its usual quality.  Has not tried anything for either his arm pain or his headache.  He is not on blood thinners.   The history is provided by the patient.    Past Medical History:  Diagnosis Date  . Arthritis   . Headache   . Hypertension   . Noncompliance with medication regimen     Patient Active Problem List   Diagnosis Date Noted  . Noncompliance with medication regimen  12/18/2016  . Chronic tension type headache 10/20/2016  . Headache(784.0) 10/19/2013  . Itching 10/19/2013  . Smoker 10/19/2013  . Essential hypertension, benign 07/08/2013  . Pain in joint, shoulder region 07/08/2013  . Cervical disc disorder with radiculopathy of cervical region 07/08/2013  . Dental caries 07/08/2013  . Spinal stenosis in cervical region 07/07/2013  . Tendinitis of left rotator cuff 07/07/2013    Past Surgical History:  Procedure Laterality Date  . ABDOMINAL EXPLORATION SURGERY    . arm surgery Left        Home Medications    Prior to Admission medications   Medication Sig Start Date End Date Taking? Authorizing Provider  lisinopril (PRINIVIL,ZESTRIL) 40 MG tablet Take 1 tablet (40 mg total) by mouth daily. 10/19/13  Yes Advani, Vernon Prey, MD  PROVENTIL HFA 108 (90 Base) MCG/ACT inhaler INHALE 1 TO 2 PUFFS Q 6 H PRN 08/22/16  Yes [provider]  tiZANidine (ZANAFLEX) 4 MG tablet Take 4 mg by mouth every 8 (eight) hours as needed for pain. 03/25/17  Yes [provider]  hydrochlorothiazide (HYDRODIURIL) 25 MG tablet Take 1 tablet (25 mg total) by mouth daily. Patient not taking: Reported on 12/18/2016 10/19/13   Lorayne Marek, MD    Family History Family History  Problem Relation Age of Onset  . Cancer Mother   . Diabetes Paternal Grandfather     Social History Social History  Tobacco Use  . Smoking status: Current Every Day Smoker    Packs/day: 0.50    Years: 40.00    Pack years: 20.00    Types: Cigarettes  . Smokeless tobacco: Never Used  Substance Use Topics  . Alcohol use: Yes    Alcohol/week: 4.2 - 8.4 oz    Types: 7 - 14 Cans of beer per week    Comment: 1-2 beers every evening  . Drug use: Yes    Types: Marijuana     Allergies   Patient has no known allergies.   Review of Systems Review of Systems  Constitutional: Negative for chills and fever.  Eyes: Negative for visual disturbance.  Respiratory: Negative for  shortness of breath.   Cardiovascular: Negative for chest pain.  Gastrointestinal: Negative for abdominal pain, nausea and vomiting.  Neurological: Positive for headaches (chronic, unchanged).     Physical Exam Updated Vital Signs BP (!) 151/94   Pulse 82   Temp 98 F (36.7 C) (Oral)   Resp 13   Ht 5\' 10"  (1.778 m)   Wt 89.8 kg (198 lb)   SpO2 94%   BMI 28.41 kg/m   Physical Exam  Constitutional: He is oriented to person, place, and time. He appears well-developed and well-nourished. No distress.  HENT:  Head: Normocephalic and atraumatic.  Right Ear: External ear normal.  Left Ear: External ear normal.  No Battle's signs, no raccoon's eyes, no rhinorrhea. No hemotympanum. 2cm area of soft tissue swelling to the crown of the head with no ecchymosis or underlying crepitus or deformity. Mild tenderness to palpation. Mild tenderness to palpation of the posterior aspect of the head which patient states is chronic and unchanged with his chronic headaches.   Eyes: Conjunctivae and EOM are normal. Pupils are equal, round, and reactive to light. Right eye exhibits no discharge. Left eye exhibits no discharge.  No pain with EOMs  Neck: Normal range of motion. Neck supple. No JVD present. No tracheal deviation present.  No midline spine tenderness, bilateral trapezius muscle tenderness, pain worsens with range of motion of the cervical spine  Cardiovascular: Normal rate, regular rhythm, normal heart sounds and intact distal pulses.  2+ radial and DP/PT pulses bl, negative Homan's bl, no lower extremity edema  Pulmonary/Chest: Effort normal and breath sounds normal. No stridor. He has no wheezes. He has no rales. He exhibits no tenderness.  Abdominal: Soft. Bowel sounds are normal. He exhibits no distension. There is no tenderness.  Musculoskeletal: Normal range of motion. He exhibits no edema or tenderness.  No deformity, crepitus, swelling, or tenderness noted to the extremities. No  midline spine TTP, bilateral trapezius muscle spasm but otherwise no paraspinal muscle tenderness, no deformity, crepitus, or step-off noted.  5/5 strength of BUE and BLE major muscle groups.   Neurological: He is alert and oriented to person, place, and time. A sensory deficit is present. No cranial nerve deficit. He exhibits normal muscle tone. Coordination normal.  Slightly altered sensation of the left forearm extending to the  first 3 digits. This is further elicited with tapping of the olecranon process.  Fluent speech, no facial droop, cranial nerves II through XII tested and intact.  Otherwise sensation is intact to soft touch of extremities.  No pronator drift, negative Romberg.  Normal gait, patient able to Heel Walk and Toe Walk without difficulty.  Normal finger to nose of the bilateral upper extremities  Skin: Skin is warm and dry. No erythema.  Psychiatric: He has a  normal mood and affect. His behavior is normal.  Nursing note and vitals reviewed.    ED Treatments / Results  Labs (all labs ordered are listed, but only abnormal results are displayed) Labs Reviewed  I-STAT TROPONIN, ED    EKG  EKG Interpretation None       Radiology No results found.  Procedures Procedures (including critical care time)  Medications Ordered in ED Medications  sodium chloride 0.9 % bolus 1,000 mL (0 mLs Intravenous Stopped 05/16/17 0843)  ketorolac (TORADOL) 30 MG/ML injection 30 mg (30 mg Intravenous Given 05/16/17 0742)  prochlorperazine (COMPAZINE) injection 10 mg (10 mg Intravenous Given 05/16/17 0740)  diphenhydrAMINE (BENADRYL) injection 25 mg (25 mg Intravenous Given 05/16/17 0739)     Initial Impression / Assessment and Plan / ED Course  I have reviewed the triage vital signs and the nursing notes.  Pertinent labs & imaging results that were available during my care of the patient were reviewed by me and considered in my medical decision making (see chart for details).       Patient presents with chronic headache and left arm pain for 3 days.  Afebrile, somewhat hypertensive with improvement while in the ED.  States he has not been taking his blood pressure medication as prescribed.  Pain in the arm manifests more so has numbness in a radicular path.  Patient has known radiculopathy due to cervical spine stenosis.  He states this feels like his usual arm flares.  His fiance requested chest workup given the left arm pain.  Patient has no chest pain.  EKG shows no evidence of ST segment abnormality or arrhythmia.  Troponin is negative.  I doubt ACS or MI contributing to his symptoms.  He states his headache is chronic and unchanged from his usual headache.  He did sustain an injury to the crown of the head from a piece of wood that fell on his head from a short distance approximately 12 inches.  No loss of consciousness or amnesia.  No focal neurological deficits on examination.  He is nontoxic in appearance.  He has an area of soft tissue swelling to the top of the head which is mildly tender to palpation but given the low risk mechanism I doubt skull fracture, ICH, SAH, or other acute intracranial abnormality.  Discussed this with patient and fianc, and they feel it is reasonable to forego CT scan at this time which I agree is reasonable.  Patient was given a migraine cocktail and on reevaluation he states his pain in his head and arm is 0/10 in severity.  He states he would like to go home.  He will follow-up with his primary care physician for reevaluation of his headaches and arm pain.  Discussed indications for return to the ED.  Patient and patient's fianc verbalized understanding of and agreement with plan and patient is stable for discharge home at this time. Discussed case with Dr. Rex Kras who agrees with assessment and plan at this time.   Final Clinical Impressions(s) / ED Diagnoses   Final diagnoses:  Injury of head, initial encounter  Chronic tension-type  headache, not intractable  Left forearm pain    ED Discharge Orders    None       Debroah Baller 05/16/17 [place], Wenda Overland, MD 05/19/17 [phone]

## 2017-05-16 NOTE — Discharge Instructions (Signed)
Continue taking your home medications as prescribed.  Take your blood pressure medications daily as prescribed.  Follow-up with your primary care physician for reevaluation of your symptoms.  Return to the ED immediately if any concerning signs or vision changes, slurred speech, altered mental status, or losing consciousness.

## 2017-05-16 NOTE — ED Triage Notes (Signed)
Pt reports getting hit in head with a 2X4 at appx 1100 on 05/15/17. Pt also reporting pain in left arm from elbow down for the last 3 days. Pt denies any LOC from head injury.

## 2017-08-08 ENCOUNTER — Other Ambulatory Visit (HOSPITAL_COMMUNITY): Payer: Self-pay | Admitting: Urology

## 2017-08-08 ENCOUNTER — Encounter: Payer: Self-pay | Admitting: Radiation Oncology

## 2017-08-08 DIAGNOSIS — C61 Malignant neoplasm of prostate: Secondary | ICD-10-CM

## 2017-09-01 ENCOUNTER — Encounter (HOSPITAL_COMMUNITY): Payer: Self-pay

## 2017-09-01 ENCOUNTER — Encounter (HOSPITAL_COMMUNITY): Payer: Medicaid Other

## 2017-09-03 ENCOUNTER — Encounter: Payer: Self-pay | Admitting: Radiation Oncology

## 2017-09-05 ENCOUNTER — Ambulatory Visit (HOSPITAL_COMMUNITY)
Admission: RE | Admit: 2017-09-05 | Discharge: 2017-09-05 | Disposition: A | Discharge status: Home / Self Care | Payer: Medicaid Other | Source: Ambulatory Visit | Attending: Urology | Admitting: Urology

## 2017-09-05 DIAGNOSIS — C61 Malignant neoplasm of prostate: Secondary | ICD-10-CM | POA: Diagnosis not present

## 2017-09-05 MED ORDER — TECHNETIUM TC 99M MEDRONATE IV KIT
20.7000 | PACK | Freq: Once | INTRAVENOUS | Status: AC
  Administered 2017-09-05: 20.7 via INTRAVENOUS

## 2017-09-08 ENCOUNTER — Ambulatory Visit
Admission: RE | Admit: 2017-09-08 | Discharge: 2017-09-08 | Disposition: A | Discharge status: Home / Self Care | Payer: Medicaid Other | Source: Ambulatory Visit | Attending: Radiation Oncology | Admitting: Radiation Oncology

## 2017-09-09 ENCOUNTER — Encounter: Payer: Self-pay | Admitting: Radiation Oncology

## 2017-10-02 ENCOUNTER — Encounter: Payer: Self-pay | Admitting: Radiation Oncology

## 2017-10-02 ENCOUNTER — Ambulatory Visit
Admission: RE | Admit: 2017-10-02 | Discharge: 2017-10-02 | Disposition: A | Discharge status: Home / Self Care | Payer: Medicaid Other | Source: Ambulatory Visit | Attending: Radiation Oncology | Admitting: Radiation Oncology

## 2017-10-02 ENCOUNTER — Other Ambulatory Visit: Payer: Self-pay

## 2017-10-02 ENCOUNTER — Encounter: Payer: Self-pay | Admitting: Medical Oncology

## 2017-10-02 VITALS — BP 156/96 | HR 84 | Temp 98.2°F | Resp 18 | Wt 208.8 lb

## 2017-10-02 DIAGNOSIS — C61 Malignant neoplasm of prostate: Secondary | ICD-10-CM | POA: Insufficient documentation

## 2017-10-02 DIAGNOSIS — Z79899 Other long term (current) drug therapy: Secondary | ICD-10-CM | POA: Insufficient documentation

## 2017-10-02 DIAGNOSIS — M129 Arthropathy, unspecified: Secondary | ICD-10-CM | POA: Diagnosis not present

## 2017-10-02 DIAGNOSIS — Z9119 Patient's noncompliance with other medical treatment and regimen: Secondary | ICD-10-CM | POA: Insufficient documentation

## 2017-10-02 DIAGNOSIS — I10 Essential (primary) hypertension: Secondary | ICD-10-CM | POA: Insufficient documentation

## 2017-10-02 DIAGNOSIS — F17210 Nicotine dependence, cigarettes, uncomplicated: Secondary | ICD-10-CM | POA: Diagnosis not present

## 2017-10-02 HISTORY — DX: Malignant neoplasm of prostate: C61

## 2017-10-02 NOTE — Progress Notes (Signed)
Introduced myself to Frank Fischer and his wife as the prostate nurse navigator and my role. He is interested in brachytherapy as treatment for his prostate cancer. I gave them my business card and asked them to call me with questions or concerns. They voiced understanding.

## 2017-10-02 NOTE — Progress Notes (Signed)
See progress note under physician encounter. 

## 2017-10-02 NOTE — Progress Notes (Signed)
[facility] Location of Tumor / Histology: Prostate adenocarcinoma  If Prostate Cancer, Gleason Score is (4 + 3) and PSA is (13.4)  Frank Fischer was told six months ago by Dr. Owens Shark his PSA was elevated. Patient transferred his care to [name]. [facility] told him his PSA was elevated and referred him to Dr. [name]. Patient reports he put off seeing Dr. [name] for sometime.  Biopsies revealed:    Past/Anticipated interventions by urology, if any:  07/21/2017: TRANSRECTAL ULTRASOUND OF THE PROSTATE 07/21/2017: PROSTATE BIOPSY  Past/Anticipated interventions by medical oncology, if any: no  Weight changes, if any: no  Bowel/Bladder complaints, if any: IPSS 7. Denies dysuria or hematuria. Reports occasional leakage.   Nausea/Vomiting, if any: no  Pain issues, if any:  Chronic aching in his arms and neck  SAFETY ISSUES:  Prior radiation? no  Pacemaker/ICD? no  Possible current pregnancy? no  Is the patient on methotrexate? no  Current Complaints / other details:  57 year old male with one son and one daughter. Accompanied by significant other. Resides in [place]. Patient does not work.

## 2017-10-02 NOTE — Progress Notes (Signed)
Radiation Oncology         [phone]) [phone] ________________________________  Initial Outpatient Consultation  Name: Frank Fischer MRN: [id number]  Date: 10/02/2017  DOB: 08-14-1960  DX:AJOINOM, No Pcp Per  Davis Gourd*   REFERRING PHYSICIAN: Davis Gourd*  DIAGNOSIS: 57 y.o. male with Stage T1c adenocarcinoma of the prostate with Gleason Score of 4+3, and PSA of 13.40    ICD-10-CM   1. Malignant neoplasm of prostate ([place]) C61     HISTORY OF PRESENT ILLNESS: Frank Fischer is a 57 y.o. male with a diagnosis of prostate cancer. He was noted to have an elevated PSA of 9.9 in 10/2016 by his primary care physician, Dr. Vista Lawman and was recommended to be evaluated with a urologist at that time.  The patient admits that he postponed the referral until 05/2017 for personal reasons.  He was evaluated in urology by Dr. [name] on 06/12/17, where a digital rectal examination was performed at that time revealing symmetrical prostate lobes without discrete nodularity.  A PSA was repeated at that time and returned further elevated at 13.40.  The patient proceeded to transrectal ultrasound with 12 biopsies of the prostate on 07/21/17.  The prostate volume measured 49.7 cc.  Out of 12 core biopsies, 4 were positive.  The maximum Gleason score was 4+3, and this was seen in the left apex lateral.  Additionally, there was Gleason 3+4 disease in the left base and right apex as well as Gleason 3+3 disease in the right base lateral.  He underwent disease staging imaging with a CT of the abdomen and pelvis and bone scan.  CT A/P on 09/03/17 did not reveal any pathologically enlarged lymph nodes or evidence of metastatic disease in the abdomen or pelvis.  Likewise, the bone scan performed on 09/05/2017 was negative for any evidence of osseous metastatic disease.  The patient reviewed the biopsy results with his urologist and he has kindly been referred today for discussion of potential radiation  treatment options. He is accompanied by his significant other/fiance.   PREVIOUS RADIATION THERAPY: No  PAST MEDICAL HISTORY:  Past Medical History:  Diagnosis Date  . Arthritis   . Headache   . Hypertension   . Noncompliance with medication regimen   . Prostate cancer ([place])       PAST SURGICAL HISTORY: Past Surgical History:  Procedure Laterality Date  . ABDOMINAL EXPLORATION SURGERY    . arm surgery Left   . PROSTATE BIOPSY  07/21/2017  . TRANSRECTAL ULTRASOUND  07/21/2017   of the prostate    FAMILY HISTORY:  Family History  Problem Relation Age of Onset  . Cancer Mother   . Diabetes Paternal Grandfather     SOCIAL HISTORY:  Social History   Socioeconomic History  . Marital status: Single    Spouse name: Not on file  . Number of children: 2  . Years of education: 63  . Highest education level: Not on file  Occupational History  . Occupation: N/A  Social Needs  . Financial resource strain: Not on file  . Food insecurity:    Worry: Not on file    Inability: Not on file  . Transportation needs:    Medical: Not on file    Non-medical: Not on file  Tobacco Use  . Smoking status: Current Every Day Smoker    Packs/day: 0.50    Years: 40.00    Pack years: 20.00    Types: Cigarettes  . Smokeless tobacco: Never Used  Substance and Sexual Activity  . Alcohol use: Yes    Alcohol/week: 4.2 - 8.4 oz    Types: 7 - 14 Cans of beer per week    Comment: 1-2 beers every evening  . Drug use: Yes    Types: Marijuana  . Sexual activity: Yes  Lifestyle  . Physical activity:    Days per week: Not on file    Minutes per session: Not on file  . Stress: Not on file  Relationships  . Social connections:    Talks on phone: Not on file    Gets together: Not on file    Attends religious service: Not on file    Active member of club or organization: Not on file    Attends meetings of clubs or organizations: Not on file    Relationship status: Not on file  . Intimate  partner violence:    Fear of current or ex partner: Not on file    Emotionally abused: Not on file    Physically abused: Not on file    Forced sexual activity: Not on file  Other Topics Concern  . Not on file  Social History Narrative   Lives at home alone   Right-handed   Caffeine: tea throughout the day    ALLERGIES: Patient has no known allergies.  MEDICATIONS:  Current Outpatient Medications  Medication Sig Dispense Refill  . lisinopril (PRINIVIL,ZESTRIL) 40 MG tablet Take 1 tablet (40 mg total) by mouth daily. 90 tablet 3  . meloxicam (MOBIC) 15 MG tablet TK 1 T PO QD  2  . PROVENTIL HFA 108 (90 Base) MCG/ACT inhaler INHALE 1 TO 2 PUFFS Q 6 H PRN  1   No current facility-administered medications for this encounter.     REVIEW OF SYSTEMS:  On review of systems, the patient reports that he is doing well overall. He denies any chest pain, shortness of breath, cough, fevers, chills, night sweats, or unintended weight changes. He denies any bowel disturbances, and denies abdominal pain, nausea or vomiting. He reports chronic aching in his neck and bilateral upper extremities. His IPSS was 7, indicating mild urinary symptoms of frequency, nocturia x4, and occasional leakage. He denies any dysuria or hematuria. He is able to complete sexual activity with most attempts. A complete review of systems is obtained and is otherwise negative.    PHYSICAL EXAM:  Wt Readings from Last 3 Encounters:  10/02/17 208 lb 12.8 oz (94.7 kg)  05/16/17 198 lb (89.8 kg)  12/18/16 195 lb 3.2 oz (88.5 kg)   Temp Readings from Last 3 Encounters:  10/02/17 98.2 F (36.8 C) (Oral)  05/16/17 98 F (36.7 C) (Oral)  07/30/16 97.8 F (36.6 C) (Oral)   BP Readings from Last 3 Encounters:  10/02/17 (!) 156/96  05/16/17 (!) 157/91  12/18/16 130/80   Pulse Readings from Last 3 Encounters:  10/02/17 84  05/16/17 82  12/18/16 64   Pain Assessment Pain Score: 0-No pain/10  In general this is a well  appearing African-American male in no acute distress. He is alert and oriented x4 and appropriate throughout the examination. HEENT reveals that the patient is normocephalic, atraumatic. EOMs are intact. PERRLA. Skin is intact without any evidence of gross lesions. Cardiovascular exam reveals a regular rate and rhythm, no clicks rubs or murmurs are auscultated. Chest reveals wheezing bilaterally. Lymphatic assessment is performed and does not reveal any adenopathy in the cervical, supraclavicular, axillary, or inguinal chains. There is 3 cm mass in  the right anterior cervical region, non-tender, no erythema, no fluctuance. Patient reports this has been present for years and has been evaluated by his PCP. Patient reports being told it is of no concern.  Abdomen has active bowel sounds in all quadrants and is intact. The abdomen is soft, non tender, non distended. Lower extremities are negative for pretibial pitting edema, deep calf tenderness, cyanosis or clubbing.  KPS = 100  100 - Normal; no complaints; no evidence of disease. 90   - Able to carry on normal activity; minor signs or symptoms of disease. 80   - Normal activity with effort; some signs or symptoms of disease. 76   - Cares for self; unable to carry on normal activity or to do active work. 60   - Requires occasional assistance, but is able to care for most of his personal needs. 50   - Requires considerable assistance and frequent medical care. 8   - Disabled; requires special care and assistance. 83   - Severely disabled; hospital admission is indicated although death not imminent. 57   - Very sick; hospital admission necessary; active supportive treatment necessary. 10   - Moribund; fatal processes progressing rapidly. 0     - Dead  Karnofsky DA, Abelmann [place], Craver LS and [place] [facility] [phone]) The use of the nitrogen mustards in the palliative treatment of carcinoma: with particular reference to bronchogenic carcinoma Cancer 1  634-56  LABORATORY DATA:  Lab Results  Component Value Date   WBC 7.6 10/17/2016   HGB 15.7 10/17/2016   HCT 46.9 10/17/2016   MCV 95 10/17/2016   PLT 241 10/17/2016   Lab Results  Component Value Date   NA 144 10/17/2016   K 4.9 10/17/2016   CL 103 10/17/2016   CO2 24 10/17/2016   Lab Results  Component Value Date   ALT 16 10/17/2016   AST 16 10/17/2016   ALKPHOS 107 10/17/2016   BILITOT 0.2 10/17/2016     RADIOGRAPHY: Nm Bone Scan Whole Body  Result Date: 09/05/2017 CLINICAL DATA:  History of prostate cancer.  PSA 13.4 on 06/12/2017. EXAM: NUCLEAR MEDICINE WHOLE BODY BONE SCAN TECHNIQUE: Whole body anterior and posterior images were obtained approximately 3 hours after intravenous injection of radiopharmaceutical. RADIOPHARMACEUTICALS:  20.7 mCi Technetium-68mMDP IV COMPARISON:  CT of the abdomen and pelvis on 09/03/2017 FINDINGS: No focal areas of increased osseous remodeling. Expected renal activity and bladder activity are noted. IMPRESSION: No evidence for osseous metastatic disease. Electronically Signed   By: ENolon NationsM.D.   On: 09/05/2017 16:28      IMPRESSION/PLAN: 1. 57y.o. gentleman with Stage T1c adenocarcinoma of the prostate with Gleason Score of 4+3, and PSA of 13.40. We discussed the patient's workup and outlined the nature of prostate cancer in this setting. The patient's T stage, Gleason's score, and PSA put him into the unfavorable intermediate risk group. Accordingly, he is eligible for a variety of potential treatment options including brachytherapy or external beam radiation.  We discussed and outlined the risks, benefits, short and long-term effects associated with radiotherapy and compared and contrasted these with prostatectomy. We also discussed the role of SpaceOAR in reducing the rectal toxicity associated with radiotherapy.   At the conclusion of our conversation, the patient is interested in moving forward with brachytherapy and use of  SpaceOAR to reduce rectal toxicity from radiotherapy.  We will share our discussion with Dr. WLovena Neighboursand move forward with [facility]  near future.  The patient met briefly with Romie Jumper in our office who will be working closely with him to coordinate OR scheduling and pre and post procedure appointments.  We will contact the pharmaceutical rep to ensure that [place] is available at the time of procedure.  He will have a prostate MRI following his post-seed CT SIM to confirm appropriate distribution of the [place].  We spent more than 50% of today's time face to face with the patient in counseling and/or coordination of care.    Nicholos Johns, PA-C    Tyler Pita, MD  [facility] Direct Dial: [phone]  Fax: [phone] [email or web address]  Skype  LinkedIn    Page Me   This document serves as a record of services personally performed by Tyler Pita, MD and Freeman Caldron, PA-C. It was created on their behalf by Rae Lips, a trained medical scribe. The creation of this record is based on the scribe's personal observations and the providers' statements to them. This document has been checked and approved by the attending providers.

## 2017-10-06 ENCOUNTER — Telehealth: Payer: Self-pay | Admitting: *Deleted

## 2017-10-06 NOTE — Telephone Encounter (Signed)
CALLED PATIENT TO INFORM OF PRE-SEED PLANNING CT FOR 10-22-17, SPOKE WITH PATIENT AND HE IS AWARE OF THIS APPT.

## 2017-10-21 ENCOUNTER — Telehealth: Payer: Self-pay | Admitting: *Deleted

## 2017-10-21 NOTE — Telephone Encounter (Signed)
CALLED PATIENT TO REMIND OF PRE-SEED APPTS. FOR 10-22-17, LVM FOR A RETURN CALL

## 2017-10-22 ENCOUNTER — Ambulatory Visit
Admission: RE | Admit: 2017-10-22 | Discharge: 2017-10-22 | Disposition: A | Discharge status: Home / Self Care | Payer: Medicaid Other | Source: Ambulatory Visit | Attending: Radiation Oncology | Admitting: Radiation Oncology

## 2017-10-22 DIAGNOSIS — C61 Malignant neoplasm of prostate: Secondary | ICD-10-CM

## 2017-10-22 NOTE — Progress Notes (Signed)
  Radiation Oncology         (336) [phone] ________________________________  Name: JAKIE DEBOW MRN: [id number]  Date: 10/22/2017  DOB: 30-Mar-1961  SIMULATION AND TREATMENT PLANNING NOTE PUBIC ARCH STUDY  VZ:CHYIFOY, No Pcp Per  Davis Gourd*  DIAGNOSIS: 57 y.o. male with Stage T1c adenocarcinoma of the prostate with Gleason Score of 4+3, and PSA of 13.40     ICD-10-CM   1. Malignant neoplasm of prostate ([place]) C61     COMPLEX SIMULATION:  The patient presented today for evaluation for possible prostate seed implant. He was brought to the radiation planning suite and placed supine on the CT couch. A 3-dimensional image study set was obtained in upload to the planning computer. There, on each axial slice, I contoured the prostate gland. Then, using three-dimensional radiation planning tools I reconstructed the prostate in view of the structures from the transperineal needle pathway to assess for possible pubic arch interference. In doing so, I did not appreciate any pubic arch interference. Also, the patient's prostate volume was estimated based on the drawn structure. The volume was 50.3 cc.  Given the pubic arch appearance and prostate volume, patient remains a good candidate to proceed with prostate seed implant. Today, he freely provided informed written consent to proceed.    PLAN: The patient will undergo prostate seed implant 145 Gy   ________________________________  Sheral Apley. Tammi Klippel, M.D.

## 2017-10-29 ENCOUNTER — Telehealth: Payer: Self-pay | Admitting: *Deleted

## 2017-10-29 ENCOUNTER — Other Ambulatory Visit: Payer: Self-pay | Admitting: Urology

## 2017-10-29 NOTE — Telephone Encounter (Signed)
Called patient to inform of appt. For chest x-ray and EKG on 11-04-17 @ 10 am @ [facility] Admitting, spoke with patient and he is aware of this appt.

## 2017-10-29 NOTE — Telephone Encounter (Signed)
Called patient to inform of implant date, spoke with patient and he is aware of this implant

## 2017-11-04 ENCOUNTER — Inpatient Hospital Stay (HOSPITAL_COMMUNITY)
Admission: RE | Admit: 2017-11-04 | Discharge: 2017-11-04 | Disposition: A | Discharge status: Home / Self Care | Payer: Medicaid Other | Source: Ambulatory Visit

## 2017-11-04 NOTE — Progress Notes (Signed)
Left 2 messages for Frank Fischer about his appointment he missed 11/04/2017 at [facility].

## 2017-11-05 ENCOUNTER — Telehealth: Payer: Self-pay | Admitting: *Deleted

## 2017-11-05 NOTE — Telephone Encounter (Signed)
CALLED PATIENT TO ASK ABOUT GETTING HIS CHEST X-RAY AND EKG, PT. SAID THAT HE COULD GET THAT CHEST X-RAY AND EKG ON 11-06-17 @ 9 AM, PATIENT AGREED TO COME IN ON 11-06-17 @ 9 AM

## 2017-11-06 ENCOUNTER — Ambulatory Visit (HOSPITAL_COMMUNITY)
Admission: RE | Admit: 2017-11-06 | Discharge: 2017-11-06 | Disposition: A | Discharge status: Home / Self Care | Payer: Medicaid Other | Source: Ambulatory Visit | Attending: Urology | Admitting: Urology

## 2017-11-06 ENCOUNTER — Encounter (HOSPITAL_COMMUNITY)
Admission: RE | Admit: 2017-11-06 | Discharge: 2017-11-06 | Disposition: A | Discharge status: Home / Self Care | Payer: Medicaid Other | Source: Ambulatory Visit | Attending: Urology | Admitting: Urology

## 2017-11-06 ENCOUNTER — Other Ambulatory Visit: Payer: Self-pay | Admitting: Urology

## 2017-11-06 DIAGNOSIS — Z01810 Encounter for preprocedural cardiovascular examination: Secondary | ICD-10-CM | POA: Insufficient documentation

## 2017-11-06 DIAGNOSIS — Z01818 Encounter for other preprocedural examination: Secondary | ICD-10-CM | POA: Insufficient documentation

## 2017-11-06 DIAGNOSIS — C61 Malignant neoplasm of prostate: Secondary | ICD-10-CM

## 2017-11-21 ENCOUNTER — Telehealth: Payer: Self-pay | Admitting: *Deleted

## 2017-11-21 NOTE — Telephone Encounter (Signed)
Called patient to inform of lab appt. For 11-26-17 - arrival time - 12:45 pm @ [facility] Admitting, lvm for a return call

## 2017-11-25 ENCOUNTER — Other Ambulatory Visit: Payer: Self-pay | Admitting: Urology

## 2017-11-26 ENCOUNTER — Encounter (HOSPITAL_COMMUNITY)
Admission: RE | Admit: 2017-11-26 | Discharge: 2017-11-26 | Disposition: A | Discharge status: Home / Self Care | Payer: Medicaid Other | Source: Ambulatory Visit | Attending: Urology | Admitting: Urology

## 2017-11-26 DIAGNOSIS — Z01818 Encounter for other preprocedural examination: Secondary | ICD-10-CM | POA: Diagnosis present

## 2017-11-26 LAB — CBC
HEMATOCRIT: 44.9 % (ref 39.0–52.0)
Hemoglobin: 15.4 g/dL (ref 13.0–17.0)
MCH: 32.7 pg (ref 26.0–34.0)
MCHC: 34.3 g/dL (ref 30.0–36.0)
MCV: 95.3 fL (ref 78.0–100.0)
Platelets: 254 10*3/uL (ref 150–400)
RBC: 4.71 MIL/uL (ref 4.22–5.81)
RDW: 14.3 % (ref 11.5–15.5)
WBC: 6 10*3/uL (ref 4.0–10.5)

## 2017-11-26 LAB — COMPREHENSIVE METABOLIC PANEL
ALK PHOS: 76 U/L (ref 38–126)
ALT: 24 U/L (ref 0–44)
ANION GAP: 8 (ref 5–15)
AST: 25 U/L (ref 15–41)
Albumin: 3.9 g/dL (ref 3.5–5.0)
BUN: 14 mg/dL (ref 6–20)
CALCIUM: 9.1 mg/dL (ref 8.9–10.3)
CO2: 25 mmol/L (ref 22–32)
Chloride: 109 mmol/L (ref 98–111)
Creatinine, Ser: 1.37 mg/dL — ABNORMAL HIGH (ref 0.61–1.24)
GFR calc non Af Amer: 55 mL/min — ABNORMAL LOW (ref >60)
Glucose, Bld: 139 mg/dL — ABNORMAL HIGH (ref 70–99)
POTASSIUM: 3.9 mmol/L (ref 3.5–5.1)
SODIUM: 142 mmol/L (ref 135–145)
Total Bilirubin: 0.8 mg/dL (ref 0.3–1.2)
Total Protein: 7 g/dL (ref 6.5–8.1)

## 2017-11-26 LAB — APTT: aPTT: 25 seconds (ref 24–36)

## 2017-11-26 LAB — PROTIME-INR
INR: 1.03
Prothrombin Time: 13.4 seconds (ref 11.4–15.2)

## 2017-11-28 ENCOUNTER — Encounter (HOSPITAL_BASED_OUTPATIENT_CLINIC_OR_DEPARTMENT_OTHER): Payer: Self-pay | Admitting: *Deleted

## 2017-11-28 ENCOUNTER — Other Ambulatory Visit: Payer: Self-pay

## 2017-11-28 NOTE — Progress Notes (Signed)
Spoke w/ pt via phone for pre-op interview.  Npo after mn.  Arrive at [facility].  Current lab results , ekg, and cxr in chart and epic.  Will do fleet enema am dos.

## 2017-12-02 ENCOUNTER — Telehealth: Payer: Self-pay | Admitting: *Deleted

## 2017-12-02 NOTE — Telephone Encounter (Signed)
CALLED PATIENT TO REMIND OF PROCEDURE FOR 12-03-17, SPOKE WITH PATIENT AND HE IS AWARE OF THIS PROCEDURE.

## 2017-12-03 ENCOUNTER — Ambulatory Visit (HOSPITAL_COMMUNITY): Payer: Medicaid Other

## 2017-12-03 ENCOUNTER — Encounter (HOSPITAL_BASED_OUTPATIENT_CLINIC_OR_DEPARTMENT_OTHER): Payer: Self-pay | Admitting: *Deleted

## 2017-12-03 ENCOUNTER — Encounter (HOSPITAL_BASED_OUTPATIENT_CLINIC_OR_DEPARTMENT_OTHER)
Admission: RE | Disposition: A | Discharge status: Home / Self Care | Payer: Self-pay | Source: Ambulatory Visit | Attending: Urology

## 2017-12-03 ENCOUNTER — Ambulatory Visit (HOSPITAL_BASED_OUTPATIENT_CLINIC_OR_DEPARTMENT_OTHER): Payer: Medicaid Other | Admitting: Anesthesiology

## 2017-12-03 ENCOUNTER — Ambulatory Visit (HOSPITAL_BASED_OUTPATIENT_CLINIC_OR_DEPARTMENT_OTHER)
Admission: RE | Admit: 2017-12-03 | Discharge: 2017-12-03 | Disposition: A | Discharge status: Home / Self Care | Payer: Medicaid Other | Source: Ambulatory Visit | Attending: Urology | Admitting: Urology

## 2017-12-03 DIAGNOSIS — Z79899 Other long term (current) drug therapy: Secondary | ICD-10-CM | POA: Diagnosis not present

## 2017-12-03 DIAGNOSIS — I10 Essential (primary) hypertension: Secondary | ICD-10-CM | POA: Diagnosis not present

## 2017-12-03 DIAGNOSIS — C61 Malignant neoplasm of prostate: Secondary | ICD-10-CM | POA: Diagnosis present

## 2017-12-03 DIAGNOSIS — M1990 Unspecified osteoarthritis, unspecified site: Secondary | ICD-10-CM | POA: Insufficient documentation

## 2017-12-03 DIAGNOSIS — F17210 Nicotine dependence, cigarettes, uncomplicated: Secondary | ICD-10-CM | POA: Diagnosis not present

## 2017-12-03 DIAGNOSIS — Z01818 Encounter for other preprocedural examination: Secondary | ICD-10-CM

## 2017-12-03 HISTORY — DX: Other chronic pain: G89.29

## 2017-12-03 HISTORY — DX: Frequency of micturition: R35.0

## 2017-12-03 HISTORY — DX: Headache, unspecified: R51.9

## 2017-12-03 HISTORY — DX: Spinal stenosis, cervical region: M48.02

## 2017-12-03 HISTORY — DX: Headache: R51

## 2017-12-03 HISTORY — PX: SPACE OAR INSTILLATION: SHX6769

## 2017-12-03 HISTORY — DX: Personal history of transient ischemic attack (TIA), and cerebral infarction without residual deficits: Z86.73

## 2017-12-03 HISTORY — PX: RADIOACTIVE SEED IMPLANT: SHX5150

## 2017-12-03 SURGERY — INSERTION, RADIATION SOURCE, PROSTATE
Anesthesia: General

## 2017-12-03 MED ORDER — PROPOFOL 10 MG/ML IV BOLUS
INTRAVENOUS | Status: AC
  Filled 2017-12-03: qty 40

## 2017-12-03 MED ORDER — ONDANSETRON HCL 4 MG/2ML IJ SOLN
INTRAMUSCULAR | Status: DC | PRN
  Administered 2017-12-03: 4 mg via INTRAVENOUS

## 2017-12-03 MED ORDER — FENTANYL CITRATE (PF) 100 MCG/2ML IJ SOLN
INTRAMUSCULAR | Status: DC | PRN
  Administered 2017-12-03 (×2): 25 ug via INTRAVENOUS

## 2017-12-03 MED ORDER — SODIUM CHLORIDE 0.9 % IJ SOLN
INTRAMUSCULAR | Status: DC | PRN
  Administered 2017-12-03: 10 mL

## 2017-12-03 MED ORDER — ONDANSETRON HCL 4 MG/2ML IJ SOLN
4.0000 mg | Freq: Once | INTRAMUSCULAR | Status: DC | PRN
  Filled 2017-12-03: qty 2

## 2017-12-03 MED ORDER — CIPROFLOXACIN IN D5W 400 MG/200ML IV SOLN
INTRAVENOUS | Status: AC
  Filled 2017-12-03: qty 200

## 2017-12-03 MED ORDER — DEXAMETHASONE SODIUM PHOSPHATE 10 MG/ML IJ SOLN
INTRAMUSCULAR | Status: DC | PRN
  Administered 2017-12-03: 10 mg via INTRAVENOUS

## 2017-12-03 MED ORDER — CIPROFLOXACIN IN D5W 400 MG/200ML IV SOLN
400.0000 mg | INTRAVENOUS | Status: AC
  Administered 2017-12-03: 400 mg via INTRAVENOUS
  Filled 2017-12-03: qty 200

## 2017-12-03 MED ORDER — IOHEXOL 300 MG/ML  SOLN
INTRAMUSCULAR | Status: DC | PRN
  Administered 2017-12-03: 7 mL

## 2017-12-03 MED ORDER — MIDAZOLAM HCL 2 MG/2ML IJ SOLN
INTRAMUSCULAR | Status: DC | PRN
  Administered 2017-12-03: 2 mg via INTRAVENOUS

## 2017-12-03 MED ORDER — MIDAZOLAM HCL 2 MG/2ML IJ SOLN
INTRAMUSCULAR | Status: AC
  Filled 2017-12-03: qty 2

## 2017-12-03 MED ORDER — PROPOFOL 10 MG/ML IV BOLUS
INTRAVENOUS | Status: DC | PRN
  Administered 2017-12-03: 50 mg via INTRAVENOUS
  Administered 2017-12-03: 150 mg via INTRAVENOUS

## 2017-12-03 MED ORDER — ONDANSETRON HCL 4 MG/2ML IJ SOLN
INTRAMUSCULAR | Status: AC
  Filled 2017-12-03: qty 2

## 2017-12-03 MED ORDER — MEPERIDINE HCL 25 MG/ML IJ SOLN
6.2500 mg | INTRAMUSCULAR | Status: DC | PRN
  Filled 2017-12-03: qty 1

## 2017-12-03 MED ORDER — TRAMADOL HCL 50 MG PO TABS: 50.0000 mg | ORAL_TABLET | Freq: Four times a day (QID) | ORAL | 0 refills | Status: AC | PRN

## 2017-12-03 MED ORDER — FENTANYL CITRATE (PF) 100 MCG/2ML IJ SOLN
INTRAMUSCULAR | Status: AC
  Filled 2017-12-03: qty 2

## 2017-12-03 MED ORDER — KETOROLAC TROMETHAMINE 30 MG/ML IJ SOLN
INTRAMUSCULAR | Status: AC
  Filled 2017-12-03: qty 1

## 2017-12-03 MED ORDER — DEXAMETHASONE SODIUM PHOSPHATE 10 MG/ML IJ SOLN
INTRAMUSCULAR | Status: AC
  Filled 2017-12-03: qty 1

## 2017-12-03 MED ORDER — HYDROMORPHONE HCL 1 MG/ML IJ SOLN
0.2500 mg | INTRAMUSCULAR | Status: DC | PRN
  Filled 2017-12-03: qty 0.5

## 2017-12-03 MED ORDER — PHENAZOPYRIDINE HCL 200 MG PO TABS: 200.0000 mg | ORAL_TABLET | Freq: Three times a day (TID) | ORAL | 0 refills | Status: DC | PRN

## 2017-12-03 MED ORDER — LIDOCAINE 2% (20 MG/ML) 5 ML SYRINGE
INTRAMUSCULAR | Status: DC | PRN
  Administered 2017-12-03: 60 mg via INTRAVENOUS

## 2017-12-03 MED ORDER — LIDOCAINE 2% (20 MG/ML) 5 ML SYRINGE
INTRAMUSCULAR | Status: AC
  Filled 2017-12-03: qty 5

## 2017-12-03 MED ORDER — KETOROLAC TROMETHAMINE 30 MG/ML IJ SOLN
INTRAMUSCULAR | Status: DC | PRN
  Administered 2017-12-03: 30 mg via INTRAVENOUS

## 2017-12-03 MED ORDER — STERILE WATER FOR IRRIGATION IR SOLN
Status: DC | PRN
  Administered 2017-12-03: 1000 mL

## 2017-12-03 MED ORDER — FLEET ENEMA 7-19 GM/118ML RE ENEM
1.0000 | ENEMA | Freq: Once | RECTAL | Status: DC
  Filled 2017-12-03: qty 1

## 2017-12-03 MED ORDER — LACTATED RINGERS IV SOLN
INTRAVENOUS | Status: DC
  Administered 2017-12-03: 07:00:00 via INTRAVENOUS
  Filled 2017-12-03: qty 1000

## 2017-12-03 SURGICAL SUPPLY — 38 items
BAG URINE DRAINAGE (UROLOGICAL SUPPLIES) ×3 IMPLANT
BLADE CLIPPER SURG (BLADE) ×3 IMPLANT
CATH COUDE FOLEY 2W 5CC 16FR (CATHETERS) ×3 IMPLANT
CATH FOLEY 2WAY SLVR  5CC 16FR (CATHETERS) ×2
CATH FOLEY 2WAY SLVR 5CC 16FR (CATHETERS) ×1 IMPLANT
CATH ROBINSON RED A/P 16FR (CATHETERS) IMPLANT
CATH ROBINSON RED A/P 20FR (CATHETERS) ×3 IMPLANT
CLOTH BEACON ORANGE TIMEOUT ST (SAFETY) ×3 IMPLANT
CONT SPECI 4OZ STER CLIK (MISCELLANEOUS) ×6 IMPLANT
COVER BACK TABLE 60X90IN (DRAPES) ×3 IMPLANT
COVER MAYO STAND STRL (DRAPES) ×3 IMPLANT
DRSG TEGADERM 4X4.75 (GAUZE/BANDAGES/DRESSINGS) ×3 IMPLANT
DRSG TEGADERM 8X12 (GAUZE/BANDAGES/DRESSINGS) ×6 IMPLANT
GAUZE SPONGE 4X4 12PLY STRL (GAUZE/BANDAGES/DRESSINGS) ×3 IMPLANT
GLOVE BIO SURGEON STRL SZ 6 (GLOVE) IMPLANT
GLOVE BIO SURGEON STRL SZ7 (GLOVE) IMPLANT
GLOVE BIO SURGEON STRL SZ7.5 (GLOVE) ×3 IMPLANT
GLOVE BIO SURGEON STRL SZ8 (GLOVE) IMPLANT
GLOVE BIOGEL PI IND STRL 6 (GLOVE) IMPLANT
GLOVE BIOGEL PI IND STRL 8 (GLOVE) IMPLANT
GLOVE BIOGEL PI INDICATOR 6 (GLOVE)
GLOVE BIOGEL PI INDICATOR 8 (GLOVE)
GLOVE ECLIPSE 8.0 STRL XLNG CF (GLOVE) ×3 IMPLANT
GLOVE INDICATOR 7.0 STRL GRN (GLOVE) IMPLANT
GOWN STRL REUS W/TWL LRG LVL3 (GOWN DISPOSABLE) ×3 IMPLANT
GOWN STRL REUS W/TWL XL LVL3 (GOWN DISPOSABLE) ×3 IMPLANT
HOLDER FOLEY CATH W/STRAP (MISCELLANEOUS) IMPLANT
I SEED AGX100 ×3 IMPLANT
IMPL SPACEOAR SYSTEM 10ML (MISCELLANEOUS) ×1 IMPLANT
IMPLANT SPACEOAR SYSTEM 10ML (MISCELLANEOUS) ×3
IV SOD CHL 0.9% 1000ML (IV SOLUTION) ×3 IMPLANT
KIT TURNOVER CYSTO (KITS) ×3 IMPLANT
MARKER SKIN DUAL TIP RULER LAB (MISCELLANEOUS) ×3 IMPLANT
PACK CYSTO (CUSTOM PROCEDURE TRAY) ×3 IMPLANT
SURGILUBE 2OZ TUBE FLIPTOP (MISCELLANEOUS) ×3 IMPLANT
SYR 10ML LL (SYRINGE) ×3 IMPLANT
UNDERPAD 30X30 (UNDERPADS AND DIAPERS) ×6 IMPLANT
WATER STERILE IRR 500ML POUR (IV SOLUTION) ×3 IMPLANT

## 2017-12-03 NOTE — Discharge Instructions (Signed)

## 2017-12-03 NOTE — Transfer of Care (Signed)
Immediate Anesthesia Transfer of Care Note  Patient: Frank Fischer  Procedure(s) Performed: RADIOACTIVE SEED IMPLANT/BRACHYTHERAPY IMPLANT (N/A ) SPACE OAR INSTILLATION (N/A )  Patient Location: PACU  Anesthesia Type:General  Level of Consciousness: awake, alert , oriented and patient cooperative  Airway & Oxygen Therapy: Patient Spontanous Breathing and Patient connected to nasal cannula oxygen  Post-op Assessment: Report given to RN and Post -op Vital signs reviewed and stable  Post vital signs: Reviewed and stable  Last Vitals:  Vitals Value Taken Time  BP 134/87 12/03/2017 10:15 AM  Temp    Pulse 78 12/03/2017 10:17 AM  Resp 14 12/03/2017 10:17 AM  SpO2 96 % 12/03/2017 10:17 AM  Vitals shown include unvalidated device data.  Last Pain:  Vitals:   12/03/17 0631  TempSrc: Oral         Complications: No apparent anesthesia complications

## 2017-12-03 NOTE — Anesthesia Postprocedure Evaluation (Signed)
Anesthesia Post Note  Patient: Frank Fischer  Procedure(s) Performed: RADIOACTIVE SEED IMPLANT/BRACHYTHERAPY IMPLANT (N/A ) SPACE OAR INSTILLATION (N/A )     Patient location during evaluation: PACU Anesthesia Type: General Level of consciousness: awake and alert Pain management: pain level controlled Vital Signs Assessment: post-procedure vital signs reviewed and stable Respiratory status: spontaneous breathing, nonlabored ventilation, respiratory function stable and patient connected to nasal cannula oxygen Cardiovascular status: blood pressure returned to baseline and stable Postop Assessment: no apparent nausea or vomiting Anesthetic complications: no    Last Vitals:  Vitals:   12/03/17 1100 12/03/17 1135  BP: (!) 155/89 (!) 156/89  Pulse: 61 (!) 55  Resp: (!) 21 16  Temp:  36.7 C  SpO2: 99% 96%    Last Pain:  Vitals:   12/03/17 1130  TempSrc:   PainSc: 0-No pain                 Frank Fischer DAVID

## 2017-12-03 NOTE — Anesthesia Preprocedure Evaluation (Addendum)
Anesthesia Evaluation  Patient identified by MRN, date of birth, ID band Patient awake    Reviewed: Allergy & Precautions, NPO status , Patient's Chart, lab work & pertinent test results  Airway Mallampati: I  TM Distance: >3 FB Neck ROM: Full    Dental  (+) Poor Dentition, Dental Advisory Given,    Pulmonary Current Smoker,    Pulmonary exam normal        Cardiovascular hypertension, Pt. on medications Normal cardiovascular exam     Neuro/Psych    GI/Hepatic negative GI ROS, Neg liver ROS,   Endo/Other  negative endocrine ROS  Renal/GU    Prostate cancer    Musculoskeletal  (+) Arthritis , Osteoarthritis,    Abdominal   Peds  Hematology negative hematology ROS (+)   Anesthesia Other Findings   Reproductive/Obstetrics                          Anesthesia Physical Anesthesia Plan  ASA: III  Anesthesia Plan: General   Post-op Pain Management:    Induction: Intravenous  PONV Risk Score and Plan: 1 and Ondansetron and Treatment may vary due to age or medical condition  Airway Management Planned: Oral ETT  Additional Equipment:   Intra-op Plan:   Post-operative Plan: Extubation in OR  Informed Consent: I have reviewed the patients History and Physical, chart, labs and discussed the procedure including the risks, benefits and alternatives for the proposed anesthesia with the patient or authorized representative who has indicated his/her understanding and acceptance.     Plan Discussed with: CRNA and Surgeon  Anesthesia Plan Comments:        Anesthesia Quick Evaluation

## 2017-12-03 NOTE — Anesthesia Procedure Notes (Addendum)
Procedure Name: LMA Insertion Date/Time: 12/03/2017 8:45 AM Performed by: Wanita Chamberlain, CRNA Pre-anesthesia Checklist: Patient identified, Timeout performed, Emergency Drugs available, Suction available and Patient being monitored Patient Re-evaluated:Patient Re-evaluated prior to induction Oxygen Delivery Method: Circle system utilized Preoxygenation: Pre-oxygenation with 100% oxygen Induction Type: IV induction Ventilation: Mask ventilation without difficulty LMA: LMA inserted LMA Size: 5.0 Number of attempts: 1 Placement Confirmation: breath sounds checked- equal and bilateral,  CO2 detector and positive ETCO2 Tube secured with: Tape Dental Injury: Teeth and Oropharynx as per pre-operative assessment

## 2017-12-03 NOTE — Progress Notes (Signed)
  Radiation Oncology         [phone] ________________________________  Name: Frank Fischer MRN: [id number]  Date: 12/03/2017  DOB: 26-Feb-1961       Prostate Seed Implant  DIAGNOSIS: 57 y.o. male with Stage T1c adenocarcinoma of the prostate with Gleason Score of 4+3, and PSA of 13.40    ICD-10-CM   1. Pre-op testing Z01.818 DG Chest 2 View    DG Chest 2 View    PROCEDURE: Insertion of radioactive I-125 seeds into the prostate gland.  RADIATION DOSE: 145 Gy, definitive therapy.  TECHNIQUE: YOSMAR RYKER was brought to the operating room with the urologist. He was placed in the dorsolithotomy position. He was catheterized and a rectal tube was inserted. The perineum was shaved, prepped and draped. The ultrasound probe was then introduced into the rectum to see the prostate gland.  TREATMENT DEVICE: A needle grid was attached to the ultrasound probe stand and anchor needles were placed.  3D PLANNING: The prostate was imaged in 3D using a sagittal sweep of the prostate probe. These images were transferred to the planning computer. There, the prostate, urethra and rectum were defined on each axial reconstructed image. Then, the software created an optimized 3D plan and a few seed positions were adjusted. The quality of the plan was reviewed using [facility] information for the target and the following two organs at risk:  Urethra and Rectum.  Then the accepted plan was printed and handed off to the radiation therapist.  Under my supervision, the custom loading of the seeds and spacers was carried out and loaded into sealed vicryl sleeves.  These pre-loaded needles were then placed into the needle holder.Marland Kitchen  PROSTATE VOLUME STUDY:  Using transrectal ultrasound the volume of the prostate was verified to be 54.1 cc.  SPECIAL TREATMENT PROCEDURE/SUPERVISION AND HANDLING: The pre-loaded needles were then delivered under sagittal guidance. A total of 23 needles were used to deposit 81 seeds in the  prostate gland. The individual seed activity was 0.495 mCi.  SpaceOAR:  Yes  COMPLEX SIMULATION: At the end of the procedure, an anterior radiograph of the pelvis was obtained to document seed positioning and count. Cystoscopy was performed to check the urethra and bladder.  MICRODOSIMETRY: At the end of the procedure, the patient was emitting 0.123 mR/hr at 1 meter. Accordingly, he was considered safe for hospital discharge.  PLAN: The patient will return to the radiation oncology clinic for post implant CT dosimetry in three weeks.   ________________________________  Sheral Apley Tammi Klippel, M.D.

## 2017-12-03 NOTE — Op Note (Signed)
PATIENT:  Frank Fischer  PRE-OPERATIVE DIAGNOSIS:  Adenocarcinoma of the prostate  POST-OPERATIVE DIAGNOSIS:  Same  PROCEDURE:  1. I-125 radioactive seed implantation 2. Cystoscopy  3. Placement of SpaceOAR  SURGEON:  Ellison Hughs, MD  Radiation oncologist: Tyler Pita, MD  ANESTHESIA:  General  EBL:  Minimal  DRAINS: None  INDICATION: Frank Fischer  Description of procedure: After informed consent the patient was brought to the major OR, placed on the table and administered general anesthesia. He was then moved to the modified lithotomy position with his perineum perpendicular to the floor. His perineum and genitalia were then sterilely prepped. An official timeout was then performed. A 16 French Foley catheter was then placed in the bladder and filled with dilute contrast, a rectal tube was placed in the rectum and the transrectal ultrasound probe was placed in the rectum and affixed to the stand. He was then sterilely draped.  Real time ultrasonography was used along with the seed planning software. This was used to develop the seed plan including the number of needles as well as number of seeds required for complete and adequate coverage. Real-time ultrasonography was then used along with the previously developed plan and the Nucletron device to implant a total of 81 seeds using 21 needles. This proceeded without difficulty or complication.   I then proceeded with placement of SpaceOAR by introducing a needle with the bevel angled inferiorly approximately 2 cm superior to the anus. This was angled downward and under direct ultrasound was placed within the space between the prostatic capsule and rectum. This was confirmed with a small amount of sterile saline injected and this was performed under direct ultrasound. I then attached the SpaceOAR to the needle and injected this in the space between the prostate and rectum with good placement noted.  A Foley catheter was then  removed as well as the transrectal ultrasound probe and rectal probe. Flexible cystoscopy was then performed using the 17 French flexible scope which revealed a normal urethra throughout its length down to the sphincter which appeared intact. The prostatic urethra revealed bilobar hypertrophy but no evidence of obstruction, seeds, spacers or lesions. The bladder was then entered and fully and systematically inspected. The ureteral orifices were noted to be of normal configuration and position. The mucosa revealed no evidence of tumors. There were also no stones identified within the bladder. I noted no seeds or spacers on the floor of the bladder and retroflexion of the scope revealed no seeds protruding from the base of the prostate.  The cystoscope was then removed and the patient was awakened and taken to recovery room in stable and satisfactory condition. He tolerated procedure well and there were no intraoperative complications.

## 2017-12-03 NOTE — H&P (Signed)
Urology Preoperative H&P   Chief Complaint: Prostate cancer  History of Present Illness: Frank Fischer is a 57 y.o. male s/p PNBx on 07/21/17. His pathology returned multi-focal Gleason 4+3 adenocarcinoma of the prostate.   From a urinary standpoint, he reports a good FOS and feels like he is emptying his bladder well. He has occasional urgency/frequency, but is not bothered by it. Nocturia x 1. Denies interval UTIs, dysuria or hematuria.   After meeting with Dr. Tammi Klippel, he has elected to proceed with brachytherapy for the treatment of his prostate cancer.   Last PSA-13.4 (05/2017)   BONE SCAN (09/03/17)--negative for osseous mets   CT ABD/PEL (09/03/17)  IMPRESSION:  No evidence of metastatic disease or other acute findings.   Small right inguinal hernia containing only fat.      Past Medical History:  Diagnosis Date  . Arthritis   . Cervical stenosis of spine   . Chronic headaches   . Frequency of urination   . History of cerebral infarction 11-28-2017  per pt never had symptoms   per MRI imaging 10-31-2016 2 small chronic lacunar infartions in the right frontal periventricular white matter , and chronic left medial orbital fracture (as seen 2014 MRI)  . Hypertension   . Noncompliance with medication regimen   . Prostate cancer [facility]) urologist-  dr Kaileb Monsanto/  oncologist-  dr Tammi Klippel   dx 07-21-2017--- Stage T1c, Gleason 4+3,  PSA 13.40,  vol 49.7cc--  scheduled for radioactive seed implants 12-03-2017    Past Surgical History:  Procedure Laterality Date  . ABDOMINAL EXPLORATION SURGERY  age 23   per pt had Small Bowel Resection and Appendectomy  . ARM SURGERY Left 1999   REPAIR INJURY-- "ARM CUT ABOUT OFF"  . PROSTATE BIOPSY  07/21/2017  dr Rollen Selders office    Allergies: No Known Allergies  Family History  Problem Relation Age of Onset  . Cancer Mother   . Diabetes Paternal Grandfather     Social History:  reports that he has been smoking cigarettes.  He has a 20.00  pack-year smoking history. He has never used smokeless tobacco. He reports that he drinks about 12.6 oz of alcohol per week. He reports that he has current or past drug history. Drug: Marijuana.  ROS: A complete review of systems was performed.  All systems are negative except for pertinent findings as noted.  Physical Exam:  Vital signs in last 24 hours: Temp:  [98.2 F (36.8 C)] 98.2 F (36.8 C) (07/17 0631) Pulse Rate:  [82] 82 (07/17 0631) Resp:  [16] 16 (07/17 0631) BP: (165)/(97) 165/97 (07/17 0631) SpO2:  [100 %] 100 % (07/17 0631) Weight:  [93.6 kg (206 lb 6.4 oz)] 93.6 kg (206 lb 6.4 oz) (07/17 0631) Constitutional:  Alert and oriented, No acute distress Cardiovascular: Regular rate and rhythm, No JVD Respiratory: Normal respiratory effort, Lungs clear bilaterally GI: Abdomen is soft, nontender, nondistended, no abdominal masses GU: No CVA tenderness Lymphatic: No lymphadenopathy Neurologic: Grossly intact, no focal deficits Psychiatric: Normal mood and affect  Laboratory Data:  No results for input(s): WBC, HGB, HCT, PLT in the last 72 hours.  No results for input(s): NA, K, CL, GLUCOSE, BUN, CALCIUM, CREATININE in the last 72 hours.  Invalid input(s): CO3   No results found for this or any previous visit (from the past 24 hour(s)). No results found for this or any previous visit (from the past 240 hour(s)).  Renal Function: Recent Labs    11/26/17 1421  CREATININE  1.37*   Estimated Creatinine Clearance: 67.5 mL/min (A) (by C-G formula based on SCr of 1.37 mg/dL (H)).  Radiologic Imaging: No results found.  I independently reviewed the above imaging studies.  Assessment and Plan Frank WACH is a 57 y.o. male with Gleason 4+3 prostate cancer  -The risks, benefits and alternatives of brachii therapy seed placement with cystoscopy and SpaceOAR was discussed with the patient.  Risks include, but are not limited to, cancer recurrence, bleeding, urinary tract  infection, blood in the stool/urine/ejaculate for several months after the procedure, MI, CVA, DVT, PE and high risk of general anesthesia.  He voices understanding and wishes to proceed.   Ellison Hughs, MD 12/03/2017, 8:03 AM  [facility] Pager: [phone]

## 2017-12-04 ENCOUNTER — Encounter (HOSPITAL_BASED_OUTPATIENT_CLINIC_OR_DEPARTMENT_OTHER): Payer: Self-pay | Admitting: Urology

## 2017-12-11 ENCOUNTER — Telehealth: Payer: Self-pay | Admitting: *Deleted

## 2017-12-11 NOTE — Telephone Encounter (Signed)
CALLED PATIENT TO REMIND OF POST SEED APPTS. FOR 12-12-17, SPOKE WITH PATIENT AND HE IS AWARE OF THESE APPTS.

## 2017-12-12 ENCOUNTER — Ambulatory Visit
Admission: RE | Admit: 2017-12-12 | Discharge: 2017-12-12 | Disposition: A | Discharge status: Home / Self Care | Payer: Medicaid Other | Source: Ambulatory Visit | Attending: Radiation Oncology | Admitting: Radiation Oncology

## 2017-12-12 ENCOUNTER — Encounter: Payer: Self-pay | Admitting: Radiation Oncology

## 2017-12-12 ENCOUNTER — Other Ambulatory Visit: Payer: Self-pay

## 2017-12-12 ENCOUNTER — Encounter: Payer: Self-pay | Admitting: Medical Oncology

## 2017-12-12 ENCOUNTER — Telehealth: Payer: Self-pay | Admitting: Radiation Oncology

## 2017-12-12 VITALS — BP 134/90 | HR 61 | Temp 98.3°F | Resp 17 | Wt 207.4 lb

## 2017-12-12 DIAGNOSIS — R350 Frequency of micturition: Secondary | ICD-10-CM | POA: Insufficient documentation

## 2017-12-12 DIAGNOSIS — C61 Malignant neoplasm of prostate: Secondary | ICD-10-CM | POA: Insufficient documentation

## 2017-12-12 DIAGNOSIS — Z923 Personal history of irradiation: Secondary | ICD-10-CM | POA: Diagnosis not present

## 2017-12-12 DIAGNOSIS — R3911 Hesitancy of micturition: Secondary | ICD-10-CM | POA: Insufficient documentation

## 2017-12-12 DIAGNOSIS — Y842 Radiological procedure and radiotherapy as the cause of abnormal reaction of the patient, or of later complication, without mention of misadventure at the time of the procedure: Secondary | ICD-10-CM | POA: Diagnosis not present

## 2017-12-12 NOTE — Progress Notes (Addendum)
Weight and vitals stable. Denies pain. Pre seed IPSS 7. Post seed IPSS 13. Reports dysuria. Denies hematuria. Reports episodes of incontinence this week. Denies diarrhea. Scheduled to follow up with Dr. [name] in two weeks. Patient reports he has not filled pyridium yet. Patient had Spaceoar placed but insurance would not approve MRI to confirm placement.  BP 134/90   Pulse 61   Temp 98.3 F (36.8 C) (Oral)   Resp 17   Wt 207 lb 6.4 oz (94.1 kg)   SpO2 96%   BMI 29.76 kg/m  Wt Readings from Last 3 Encounters:  12/12/17 207 lb 6.4 oz (94.1 kg)  12/03/17 206 lb 6.4 oz (93.6 kg)  10/02/17 208 lb 12.8 oz (94.7 kg)

## 2017-12-12 NOTE — Telephone Encounter (Signed)
Patient's significant other, [name], called requesting another Tramadol script. She reports her husband lost the script. Provided her with Dr. Jackson Latino numbers and encouraged her to call him for a new script since he is the original prescriber. She verbalized understanding and expressed appreciation for the call back.

## 2017-12-12 NOTE — Progress Notes (Signed)
Frank Fischer states he is doing well post brachytherapy. He reports dysuria but states it continues to improve. We discussed how his urinary symptoms will continue to improve. He will follow up with Dr. [name] 01/15/18 for PSA.

## 2017-12-12 NOTE — Progress Notes (Signed)
Radiation Oncology         [phone] ________________________________  Name: AADI BORDNER MRN: [id number]  Date: 12/12/2017  DOB: 12/14/1960  Post-Seed Follow-Up Visit Note  CC: Patient, No Pcp Per  Davis Gourd*  Diagnosis:   57 y.o. male with Stage T1c adenocarcinoma of the prostate with Gleason Score of 4+3, and PSA of 13.40    ICD-10-CM   1. Malignant neoplasm of prostate ([place]) C61     Interval Since Last Radiation:  1 week, 2 days  12/03/17:  Insertion of radioactive I-125 seeds into the prostate gland; 145 Gy, definitive therapy with placement of SpaceOAR gel.  Narrative:  The patient returns today for routine follow-up.  He is complaining of increased urinary frequency and urinary hesitation symptoms. He filled out a questionnaire regarding urinary function today providing and overall IPSS score of 13 characterizing his symptoms as moderate.  His pre-implant score was 7. He reports dysuria at the beginning of his stream, weak stream, frequency, urgency and straining to void but feels that he is able to empty his bladder well on voiding.  He has nocturia x2 which is about back to his baseline.  He denies gross hematuria.  He has had a couple of episodes of urge incontinence when trying to postpone urination but otherwise has good urinary and bowel control.  He denies abdominal pain, suprapubic discomfort/distention, constipation or diarrhea.  He reports a healthy appetite and is maintaining his weight.  He has mild associated fatigue but overall is pleased with his progress to date.  ALLERGIES:  has No Known Allergies.  Meds: Current Outpatient Medications  Medication Sig Dispense Refill  . lisinopril (PRINIVIL,ZESTRIL) 40 MG tablet Take 1 tablet (40 mg total) by mouth daily. (Patient taking differently: Take 40 mg by mouth every morning. ) 90 tablet 3  . meloxicam (MOBIC) 15 MG tablet TK 1 T PO QD-- takes as needed  2  . PROVENTIL HFA 108 (90 Base) MCG/ACT inhaler  INHALE 1 TO 2 PUFFS Q 6 H PRN  1  . phenazopyridine (PYRIDIUM) 200 MG tablet Take 1 tablet (200 mg total) by mouth 3 (three) times daily as needed (for pain with urination). (Patient not taking: Reported on 12/12/2017) 30 tablet 0   No current facility-administered medications for this encounter.     Physical Findings: In general this is a well appearing African-American male in no acute distress.  He's alert and oriented x4 and appropriate throughout the examination. Cardiopulmonary assessment is negative for acute distress and he exhibits normal effort.   Lab Findings: Lab Results  Component Value Date   WBC 6.0 11/26/2017   HGB 15.4 11/26/2017   HCT 44.9 11/26/2017   MCV 95.3 11/26/2017   PLT 254 11/26/2017    Radiographic Findings:  Patient underwent CT imaging in our clinic for post implant dosimetry. The CT was reviewed by Dr. Tammi Klippel and appears to demonstrate an adequate distribution of radioactive seeds throughout the prostate gland. There are no seeds in or near the rectum.  Unfortunately, his insurance would not authorize an MRI of the prostate for verification of SpaceOAR placement but we suspect the final radiation plan and dosimetry will show appropriate coverage of the prostate gland.   Impression/Plan: 57 y.o. male with Stage T1c adenocarcinoma of the prostate with Gleason Score of 4+3, and PSA of 13.40.  The patient is recovering from the effects of radiation. His urinary symptoms should gradually improve over the next 4-6 months. We talked about this  today. He is encouraged by his improvement already and is otherwise pleased with his outcome. We also talked about long-term follow-up for prostate cancer following seed implant. He understands that ongoing PSA determinations and digital rectal exams will help perform surveillance to rule out disease recurrence. He has a follow up appointment scheduled with [facility] on 01/15/18. He understands what to expect with his PSA measures.  Patient was also educated today about some of the long-term effects from radiation including a small risk for rectal bleeding and possibly erectile dysfunction. We talked about some of the general management approaches to these potential complications. However, I did encourage the patient to contact our office or return at any point if he has questions or concerns related to his previous radiation and prostate cancer.    Nicholos Johns, PA-C

## 2017-12-12 NOTE — Progress Notes (Signed)
  Radiation Oncology         [phone] ________________________________  Name: Frank Fischer MRN: [id number]  Date: 12/12/2017  DOB: 08/26/1960  COMPLEX SIMULATION NOTE  NARRATIVE:  The patient was brought to the [facility] today following prostate seed implantation approximately one month ago.  Identity was confirmed.  All relevant records and images related to the planned course of therapy were reviewed.  Then, the patient was set-up supine.  CT images were obtained.  The CT images were loaded into the planning software.  Then the prostate and rectum were contoured.  Treatment planning then occurred.  The implanted iodine 125 seeds were identified by the physics staff for projection of radiation distribution.  I have requested : 3D Simulation  I have requested a DVH of the following structures: Prostate and rectum.    ________________________________  Sheral Apley. Tammi Klippel, M.D.

## 2017-12-16 ENCOUNTER — Telehealth: Payer: Self-pay | Admitting: *Deleted

## 2017-12-16 NOTE — Telephone Encounter (Signed)
CALLED PATIENT TO INFORM OF MRI FOR 12-23-17 - ARRIVAL TIME - 8:30 AM @ [facility], NO RESTRICTIONS TO TEST,  SPOKE WITH PATIENT AND HE IS AWARE OF THIS TEST

## 2017-12-20 ENCOUNTER — Ambulatory Visit (HOSPITAL_COMMUNITY): Payer: Medicaid Other

## 2017-12-23 ENCOUNTER — Ambulatory Visit (HOSPITAL_COMMUNITY): Admission: RE | Admit: 2017-12-23 | Payer: Medicaid Other | Source: Ambulatory Visit

## 2017-12-29 ENCOUNTER — Ambulatory Visit (HOSPITAL_COMMUNITY)
Admission: RE | Admit: 2017-12-29 | Discharge: 2017-12-29 | Disposition: A | Discharge status: Home / Self Care | Payer: Medicaid Other | Source: Ambulatory Visit | Attending: Urology | Admitting: Urology

## 2017-12-29 DIAGNOSIS — C61 Malignant neoplasm of prostate: Secondary | ICD-10-CM | POA: Insufficient documentation

## 2018-01-14 ENCOUNTER — Ambulatory Visit
Admission: RE | Admit: 2018-01-14 | Discharge: 2018-01-14 | Disposition: A | Discharge status: Home / Self Care | Payer: Medicaid Other | Source: Ambulatory Visit | Attending: Radiation Oncology | Admitting: Radiation Oncology

## 2018-01-14 DIAGNOSIS — C61 Malignant neoplasm of prostate: Secondary | ICD-10-CM | POA: Diagnosis not present

## 2018-01-19 ENCOUNTER — Encounter: Payer: Self-pay | Admitting: Radiation Oncology

## 2018-01-19 NOTE — Progress Notes (Signed)
  Radiation Oncology         [phone] ________________________________  Name: ELENA COTHERN MRN: [id number]  Date: 01/14/18  DOB: 12-22-1960  3D Planning Note   Prostate Brachytherapy Post-Implant Dosimetry  Diagnosis: 57 y.o. male with Stage T1c adenocarcinoma of the prostate with Gleason Score of 4+3, and PSA of 13.40  Narrative: On a previous date, SAVON BORDONARO returned following prostate seed implantation for post implant planning. He underwent CT scan complex simulation to delineate the three-dimensional structures of the pelvis and demonstrate the radiation distribution.  Since that time, the seed localization, and complex isodose planning with dose volume histograms have now been completed.  Results:   Prostate Coverage - The dose of radiation delivered to the 90% or more of the prostate gland (D90) was 101.08% of the prescription dose. This exceeds our goal of greater than 90%. Rectal Sparing - The volume of rectal tissue receiving the prescription dose or higher was 0.0 cc. This falls under our thresholds tolerance of 1.0 cc.  Impression: The prostate seed implant appears to show adequate target coverage and appropriate rectal sparing.  Plan:  The patient will continue to follow with urology for ongoing PSA determinations. I would anticipate a high likelihood for local tumor control with minimal risk for rectal morbidity.  ________________________________  Sheral Apley Tammi Klippel, M.D.

## 2018-09-18 ENCOUNTER — Encounter (HOSPITAL_COMMUNITY): Payer: Self-pay | Admitting: *Deleted

## 2018-09-18 ENCOUNTER — Other Ambulatory Visit: Payer: Self-pay

## 2018-09-18 NOTE — Progress Notes (Signed)
Pt denies SOB, chest pain, and being under the care of a cardiologist. Pt denies having a stress test, echo and cardiac cath. Pt denies having recent labs. Pt made aware to stop taking Aspirin (unless otherwise advised by surgeon), vitamins, fish oil and herbal medications. Do not take any NSAIDs ie: Ibuprofen, Advil, Naproxen (Aleve), Motrin, BC and Goody Powder.  Pt denies that he and members of his family tested positive for COVID-19.  Coronavirus Screening  Pt denies that he and members of his family experienced the following symptoms:  Cough yes/no: No Fever (>100.53F)  yes/no: No Runny nose yes/no: No Sore throat yes/no: No Difficulty breathing/shortness of breath  yes/no: No  Have you or a family member traveled in the last 14 days and where? yes/no: No  Pt was reminded that hospital visitation restrictions are in effect and the importance of the restrictions.  Pt verbalized understanding of all pre-op instructions.

## 2018-09-20 NOTE — Anesthesia Preprocedure Evaluation (Addendum)
Anesthesia Evaluation  Patient identified by MRN, date of birth, ID band Patient awake    Reviewed: Allergy & Precautions, NPO status , Patient's Chart, lab work & pertinent test results  History of Anesthesia Complications Negative for: history of anesthetic complications  Airway Mallampati: II  TM Distance: >3 FB Neck ROM: Full    Dental  (+) Poor Dentition, Missing, Chipped, Loose, Dental Advisory Given   Pulmonary Current Smoker,    breath sounds clear to auscultation       Cardiovascular hypertension, Pt. on medications (-) angina Rhythm:Regular Rate:Normal     Neuro/Psych CVA, No Residual Symptoms    GI/Hepatic negative GI ROS, (+)     substance abuse  alcohol use and marijuana use,   Endo/Other  obese  Renal/GU negative Renal ROS   Prostate cancer    Musculoskeletal   Abdominal (+) + obese,   Peds  Hematology negative hematology ROS (+)   Anesthesia Other Findings   Reproductive/Obstetrics                            Anesthesia Physical Anesthesia Plan  ASA: III  Anesthesia Plan: General   Post-op Pain Management:    Induction: Intravenous  PONV Risk Score and Plan: 1 and Ondansetron and Dexamethasone  Airway Management Planned: Nasal ETT  Additional Equipment:   Intra-op Plan:   Post-operative Plan: Extubation in OR  Informed Consent: I have reviewed the patients History and Physical, chart, labs and discussed the procedure including the risks, benefits and alternatives for the proposed anesthesia with the patient or authorized representative who has indicated his/her understanding and acceptance.     Dental advisory given  Plan Discussed with: CRNA and Surgeon  Anesthesia Plan Comments:        Anesthesia Quick Evaluation

## 2018-09-21 ENCOUNTER — Ambulatory Visit (HOSPITAL_COMMUNITY)
Admission: RE | Admit: 2018-09-21 | Discharge: 2018-09-21 | Disposition: A | Discharge status: Home / Self Care | Payer: Medicaid Other | Attending: Oral Surgery | Admitting: Oral Surgery

## 2018-09-21 ENCOUNTER — Ambulatory Visit (HOSPITAL_COMMUNITY): Payer: Medicaid Other | Admitting: Anesthesiology

## 2018-09-21 ENCOUNTER — Encounter (HOSPITAL_COMMUNITY): Payer: Self-pay

## 2018-09-21 ENCOUNTER — Other Ambulatory Visit: Payer: Self-pay

## 2018-09-21 ENCOUNTER — Encounter (HOSPITAL_COMMUNITY)
Admission: RE | Disposition: A | Discharge status: Home / Self Care | Payer: Self-pay | Source: Home / Self Care | Attending: Oral Surgery

## 2018-09-21 DIAGNOSIS — Z8546 Personal history of malignant neoplasm of prostate: Secondary | ICD-10-CM | POA: Diagnosis not present

## 2018-09-21 DIAGNOSIS — K047 Periapical abscess without sinus: Secondary | ICD-10-CM | POA: Insufficient documentation

## 2018-09-21 DIAGNOSIS — I10 Essential (primary) hypertension: Secondary | ICD-10-CM | POA: Insufficient documentation

## 2018-09-21 DIAGNOSIS — K029 Dental caries, unspecified: Secondary | ICD-10-CM | POA: Insufficient documentation

## 2018-09-21 DIAGNOSIS — K0889 Other specified disorders of teeth and supporting structures: Secondary | ICD-10-CM | POA: Insufficient documentation

## 2018-09-21 DIAGNOSIS — Z6831 Body mass index (BMI) 31.0-31.9, adult: Secondary | ICD-10-CM | POA: Diagnosis not present

## 2018-09-21 DIAGNOSIS — M1990 Unspecified osteoarthritis, unspecified site: Secondary | ICD-10-CM | POA: Diagnosis not present

## 2018-09-21 DIAGNOSIS — F17200 Nicotine dependence, unspecified, uncomplicated: Secondary | ICD-10-CM | POA: Diagnosis not present

## 2018-09-21 DIAGNOSIS — Z9114 Patient's other noncompliance with medication regimen: Secondary | ICD-10-CM | POA: Diagnosis not present

## 2018-09-21 DIAGNOSIS — K0530 Chronic periodontitis, unspecified: Secondary | ICD-10-CM | POA: Insufficient documentation

## 2018-09-21 DIAGNOSIS — E669 Obesity, unspecified: Secondary | ICD-10-CM | POA: Diagnosis not present

## 2018-09-21 DIAGNOSIS — Z8673 Personal history of transient ischemic attack (TIA), and cerebral infarction without residual deficits: Secondary | ICD-10-CM | POA: Diagnosis not present

## 2018-09-21 HISTORY — DX: Dental caries, unspecified: K02.9

## 2018-09-21 HISTORY — PX: TOOTH EXTRACTION: SHX859

## 2018-09-21 LAB — CBC WITH DIFFERENTIAL/PLATELET
Abs Immature Granulocytes: 0.03 10*3/uL (ref 0.00–0.07)
Basophils Absolute: 0 10*3/uL (ref 0.0–0.1)
Basophils Relative: 1 %
Eosinophils Absolute: 0.1 10*3/uL (ref 0.0–0.5)
Eosinophils Relative: 1 %
HCT: 43.6 % (ref 39.0–52.0)
Hemoglobin: 14.6 g/dL (ref 13.0–17.0)
Immature Granulocytes: 1 %
Lymphocytes Relative: 38 %
Lymphs Abs: 2.1 10*3/uL (ref 0.7–4.0)
MCH: 32.8 pg (ref 26.0–34.0)
MCHC: 33.5 g/dL (ref 30.0–36.0)
MCV: 98 fL (ref 80.0–100.0)
Monocytes Absolute: 0.6 10*3/uL (ref 0.1–1.0)
Monocytes Relative: 10 %
Neutro Abs: 2.9 10*3/uL (ref 1.7–7.7)
Neutrophils Relative %: 49 %
Platelets: 273 10*3/uL (ref 150–400)
RBC: 4.45 MIL/uL (ref 4.22–5.81)
RDW: 15 % (ref 11.5–15.5)
WBC: 5.6 10*3/uL (ref 4.0–10.5)
nRBC: 0 % (ref 0.0–0.2)

## 2018-09-21 LAB — BASIC METABOLIC PANEL
Anion gap: 8 (ref 5–15)
BUN: 14 mg/dL (ref 6–20)
CO2: 23 mmol/L (ref 22–32)
Calcium: 8.8 mg/dL — ABNORMAL LOW (ref 8.9–10.3)
Chloride: 109 mmol/L (ref 98–111)
Creatinine, Ser: 1.17 mg/dL (ref 0.61–1.24)
GFR calc Af Amer: >60 mL/min (ref >60)
GFR calc non Af Amer: >60 mL/min (ref >60)
Glucose, Bld: 109 mg/dL — ABNORMAL HIGH (ref 70–99)
Potassium: 3.9 mmol/L (ref 3.5–5.1)
Sodium: 140 mmol/L (ref 135–145)

## 2018-09-21 SURGERY — DENTAL RESTORATION/EXTRACTIONS
Anesthesia: General | Site: Mouth

## 2018-09-21 MED ORDER — MIDAZOLAM HCL 2 MG/2ML IJ SOLN
INTRAMUSCULAR | Status: AC
  Filled 2018-09-21: qty 2

## 2018-09-21 MED ORDER — OXYCODONE-ACETAMINOPHEN 5-325 MG PO TABS: 1.0000 | ORAL_TABLET | Freq: Four times a day (QID) | ORAL | 0 refills | Status: DC | PRN

## 2018-09-21 MED ORDER — LABETALOL HCL 5 MG/ML IV SOLN
5.0000 mg | INTRAVENOUS | Status: AC | PRN
  Administered 2018-09-21 (×4): 5 mg via INTRAVENOUS

## 2018-09-21 MED ORDER — ONDANSETRON HCL 4 MG/2ML IJ SOLN
INTRAMUSCULAR | Status: AC
  Filled 2018-09-21: qty 2

## 2018-09-21 MED ORDER — ONDANSETRON HCL 4 MG/2ML IJ SOLN
INTRAMUSCULAR | Status: DC | PRN
  Administered 2018-09-21: 4 mg via INTRAVENOUS

## 2018-09-21 MED ORDER — SUGAMMADEX SODIUM 200 MG/2ML IV SOLN
INTRAVENOUS | Status: DC | PRN
  Administered 2018-09-21: 200 mg via INTRAVENOUS

## 2018-09-21 MED ORDER — FENTANYL CITRATE (PF) 100 MCG/2ML IJ SOLN
25.0000 ug | INTRAMUSCULAR | Status: DC | PRN
  Administered 2018-09-21: 25 ug via INTRAVENOUS

## 2018-09-21 MED ORDER — LACTATED RINGERS IV SOLN
INTRAVENOUS | Status: DC | PRN
  Administered 2018-09-21: 07:00:00 via INTRAVENOUS

## 2018-09-21 MED ORDER — LIDOCAINE 2% (20 MG/ML) 5 ML SYRINGE: INTRAMUSCULAR | Status: DC | PRN

## 2018-09-21 MED ORDER — LIDOCAINE-EPINEPHRINE 2 %-1:100000 IJ SOLN
INTRAMUSCULAR | Status: AC
  Filled 2018-09-21: qty 1

## 2018-09-21 MED ORDER — SODIUM CHLORIDE 0.9 % IV SOLN: INTRAVENOUS | Status: DC | PRN

## 2018-09-21 MED ORDER — SUCCINYLCHOLINE CHLORIDE 20 MG/ML IJ SOLN
INTRAMUSCULAR | Status: DC | PRN
  Administered 2018-09-21: 120 mg via INTRAVENOUS

## 2018-09-21 MED ORDER — OXYMETAZOLINE HCL 0.05 % NA SOLN
NASAL | Status: AC
  Filled 2018-09-21: qty 30

## 2018-09-21 MED ORDER — 0.9 % SODIUM CHLORIDE (POUR BTL) OPTIME
TOPICAL | Status: DC | PRN
  Administered 2018-09-21: 1000 mL

## 2018-09-21 MED ORDER — LIDOCAINE 2% (20 MG/ML) 5 ML SYRINGE
INTRAMUSCULAR | Status: AC
  Filled 2018-09-21: qty 5

## 2018-09-21 MED ORDER — LIDOCAINE-EPINEPHRINE 2 %-1:100000 IJ SOLN
INTRAMUSCULAR | Status: DC | PRN
  Administered 2018-09-21: 20 mL via INTRADERMAL

## 2018-09-21 MED ORDER — CEFAZOLIN SODIUM-DEXTROSE 2-4 GM/100ML-% IV SOLN
2.0000 g | INTRAVENOUS | Status: AC
  Administered 2018-09-21: 08:00:00 2 g via INTRAVENOUS
  Filled 2018-09-21: qty 100

## 2018-09-21 MED ORDER — LABETALOL HCL 5 MG/ML IV SOLN
INTRAVENOUS | Status: AC
  Filled 2018-09-21: qty 4

## 2018-09-21 MED ORDER — ROCURONIUM BROMIDE 50 MG/5ML IV SOSY
PREFILLED_SYRINGE | INTRAVENOUS | Status: DC | PRN
  Administered 2018-09-21: 40 mg via INTRAVENOUS

## 2018-09-21 MED ORDER — ROCURONIUM BROMIDE 10 MG/ML (PF) SYRINGE
PREFILLED_SYRINGE | INTRAVENOUS | Status: AC
  Filled 2018-09-21: qty 10

## 2018-09-21 MED ORDER — DEXAMETHASONE SODIUM PHOSPHATE 10 MG/ML IJ SOLN
INTRAMUSCULAR | Status: AC
  Filled 2018-09-21: qty 1

## 2018-09-21 MED ORDER — MIDAZOLAM HCL 5 MG/5ML IJ SOLN
INTRAMUSCULAR | Status: DC | PRN
  Administered 2018-09-21: 2 mg via INTRAVENOUS

## 2018-09-21 MED ORDER — DEXAMETHASONE SODIUM PHOSPHATE 10 MG/ML IJ SOLN
INTRAMUSCULAR | Status: DC | PRN
  Administered 2018-09-21: 10 mg via INTRAVENOUS

## 2018-09-21 MED ORDER — PROMETHAZINE HCL 25 MG/ML IJ SOLN: 6.2500 mg | INTRAMUSCULAR | Status: DC | PRN

## 2018-09-21 MED ORDER — PROPOFOL 10 MG/ML IV BOLUS
INTRAVENOUS | Status: AC
  Filled 2018-09-21: qty 40

## 2018-09-21 MED ORDER — MEPERIDINE HCL 25 MG/ML IJ SOLN: 6.2500 mg | INTRAMUSCULAR | Status: DC | PRN

## 2018-09-21 MED ORDER — FENTANYL CITRATE (PF) 100 MCG/2ML IJ SOLN
INTRAMUSCULAR | Status: AC
  Filled 2018-09-21: qty 2

## 2018-09-21 MED ORDER — SODIUM CHLORIDE 0.9 % IR SOLN
Status: DC | PRN
  Administered 2018-09-21: 1000 mL

## 2018-09-21 MED ORDER — SUCCINYLCHOLINE CHLORIDE 200 MG/10ML IV SOSY
PREFILLED_SYRINGE | INTRAVENOUS | Status: AC
  Filled 2018-09-21: qty 10

## 2018-09-21 MED ORDER — PROPOFOL 10 MG/ML IV BOLUS
INTRAVENOUS | Status: DC | PRN
  Administered 2018-09-21: 100 mg via INTRAVENOUS
  Administered 2018-09-21: 200 mg via INTRAVENOUS
  Administered 2018-09-21 (×2): 50 mg via INTRAVENOUS

## 2018-09-21 MED ORDER — FENTANYL CITRATE (PF) 250 MCG/5ML IJ SOLN
INTRAMUSCULAR | Status: AC
  Filled 2018-09-21: qty 5

## 2018-09-21 MED ORDER — MIDAZOLAM HCL 2 MG/2ML IJ SOLN: 0.5000 mg | Freq: Once | INTRAMUSCULAR | Status: DC | PRN

## 2018-09-21 MED ORDER — LIDOCAINE 2% (20 MG/ML) 5 ML SYRINGE
INTRAMUSCULAR | Status: DC | PRN
  Administered 2018-09-21: 75 mg via INTRAVENOUS

## 2018-09-21 MED ORDER — FENTANYL CITRATE (PF) 100 MCG/2ML IJ SOLN
INTRAMUSCULAR | Status: DC | PRN
  Administered 2018-09-21 (×2): 50 ug via INTRAVENOUS
  Administered 2018-09-21: 100 ug via INTRAVENOUS

## 2018-09-21 MED ORDER — AMOXICILLIN 500 MG PO CAPS: 500.0000 mg | ORAL_CAPSULE | Freq: Three times a day (TID) | ORAL | 0 refills | Status: DC

## 2018-09-21 SURGICAL SUPPLY — 39 items
BLADE SURG 15 STRL LF DISP TIS (BLADE) ×1 IMPLANT
BLADE SURG 15 STRL SS (BLADE) ×2
BUR CROSS CUT FISSURE 1.6 (BURR) ×2 IMPLANT
BUR CROSS CUT FISSURE 1.6MM (BURR) ×1
BUR EGG ELITE 4.0 (BURR) ×2 IMPLANT
BUR EGG ELITE 4.0MM (BURR) ×1
CANISTER SUCT 3000ML PPV (MISCELLANEOUS) ×3 IMPLANT
COVER SURGICAL LIGHT HANDLE (MISCELLANEOUS) ×3 IMPLANT
COVER WAND RF STERILE (DRAPES) IMPLANT
DECANTER SPIKE VIAL GLASS SM (MISCELLANEOUS) IMPLANT
DRAPE U-SHAPE 76X120 STRL (DRAPES) ×3 IMPLANT
GAUZE PACKING FOLDED 2  STR (GAUZE/BANDAGES/DRESSINGS) ×2
GAUZE PACKING FOLDED 2 STR (GAUZE/BANDAGES/DRESSINGS) ×1 IMPLANT
GLOVE BIO SURGEON STRL SZ 6.5 (GLOVE) IMPLANT
GLOVE BIO SURGEON STRL SZ7 (GLOVE) IMPLANT
GLOVE BIO SURGEON STRL SZ7.5 (GLOVE) ×3 IMPLANT
GLOVE BIO SURGEONS STRL SZ 6.5 (GLOVE)
GLOVE BIOGEL PI IND STRL 6.5 (GLOVE) IMPLANT
GLOVE BIOGEL PI IND STRL 7.0 (GLOVE) IMPLANT
GLOVE BIOGEL PI INDICATOR 6.5 (GLOVE)
GLOVE BIOGEL PI INDICATOR 7.0 (GLOVE)
GOWN STRL REUS W/ TWL LRG LVL3 (GOWN DISPOSABLE) ×1 IMPLANT
GOWN STRL REUS W/ TWL XL LVL3 (GOWN DISPOSABLE) ×1 IMPLANT
GOWN STRL REUS W/TWL LRG LVL3 (GOWN DISPOSABLE) ×2
GOWN STRL REUS W/TWL XL LVL3 (GOWN DISPOSABLE) ×2
IV NS 1000ML (IV SOLUTION) ×2
IV NS 1000ML BAXH (IV SOLUTION) ×1 IMPLANT
KIT BASIN OR (CUSTOM PROCEDURE TRAY) ×3 IMPLANT
KIT TURNOVER KIT B (KITS) ×3 IMPLANT
NEEDLE 22X1 1/2 (OR ONLY) (NEEDLE) ×6 IMPLANT
NS IRRIG 1000ML POUR BTL (IV SOLUTION) ×3 IMPLANT
PAD ARMBOARD 7.5X6 YLW CONV (MISCELLANEOUS) ×3 IMPLANT
SLEEVE IRRIGATION ELITE 7 (MISCELLANEOUS) ×3 IMPLANT
SPONGE SURGIFOAM ABS GEL 12-7 (HEMOSTASIS) IMPLANT
SUT CHROMIC 3 0 PS 2 (SUTURE) ×3 IMPLANT
SYR CONTROL 10ML LL (SYRINGE) ×3 IMPLANT
TRAY ENT MC OR (CUSTOM PROCEDURE TRAY) ×3 IMPLANT
TUBING IRRIGATION (MISCELLANEOUS) ×3 IMPLANT
YANKAUER SUCT BULB TIP NO VENT (SUCTIONS) ×3 IMPLANT

## 2018-09-21 NOTE — Anesthesia Procedure Notes (Addendum)
Procedure Name: Intubation Date/Time: 09/21/2018 7:47 AM Performed by: Scheryl Darter, CRNA Pre-anesthesia Checklist: Patient identified, Emergency Drugs available, Suction available and Patient being monitored Patient Re-evaluated:Patient Re-evaluated prior to induction Oxygen Delivery Method: Circle System Utilized Preoxygenation: Pre-oxygenation with 100% oxygen Induction Type: IV induction Ventilation: Mask ventilation without difficulty Laryngoscope Size: Marsch and 2 Grade View: Grade I Nasal Tubes: Nasal Rae, Nasal prep performed, Right and Magill forceps - small, utilized Tube size: 8.0 mm Number of attempts: 3 Placement Confirmation: ETT inserted through vocal cords under direct vision,  positive ETCO2 and breath sounds checked- equal and bilateral Tube secured with: Tape Dental Injury: Teeth and Oropharynx as per pre-operative assessment  Comments: NTT placed after prep with nose spray/dilated with NPA 7.5 and /8.0 NPA. 7.5 placed with grade 1 view/cuff leak noted/exchanged over blue tube exchanger to #8.0NTT with ease/Dr Singer/good fit/no leak

## 2018-09-21 NOTE — Anesthesia Postprocedure Evaluation (Signed)
Anesthesia Post Note  Patient: Frank Fischer  Procedure(s) Performed: DENTAL EXTRACTIONS WITH ALVEOLIPLASTY (N/A Mouth)     Patient location during evaluation: PACU Anesthesia Type: General Level of consciousness: awake and alert, patient cooperative and oriented Pain management: pain level controlled Vital Signs Assessment: post-procedure vital signs reviewed and stable Respiratory status: spontaneous breathing, nonlabored ventilation and respiratory function stable Cardiovascular status: blood pressure returned to baseline and stable Postop Assessment: no apparent nausea or vomiting Anesthetic complications: no    Last Vitals:  Vitals:   09/21/18 0920 09/21/18 0923  BP: (!) 175/103 (!) 171/93  Pulse:  70  Resp:  19  Temp:    SpO2:  95%    Last Pain:  Vitals:   09/21/18 0923  TempSrc:   PainSc: Asleep                 Mccabe Gloria,E. Kikuye Korenek

## 2018-09-21 NOTE — Op Note (Signed)
09/21/2018  8:31 AM  PATIENT:  Frank Fischer  58 y.o. male  PRE-OPERATIVE DIAGNOSIS:  MULTIPLE DENTAL ABSCESSES, Non-restorable teeth # 1, 6, 11, 16, 21, 22, 23, 24, 25, 26, 27, 28, 32  POST-OPERATIVE DIAGNOSIS:  SAME  PROCEDURE:  Procedure(s): DENTAL EXTRACTIONS TEETH #  teeth # 1, 6, 11, 16, 21, 22, 23, 24, 25, 26, 27, 28, 32 ; ALVEOLOPLASTY RIGHT AND LEFT MANDIBLE  SURGEON:  Surgeon(s): Diona Browner, DDS  ANESTHESIA:   local and general  EBL:  minimal  DRAINS: none   SPECIMEN:  No Specimen  COUNTS:  YES  PLAN OF CARE: Discharge to home after PACU  PATIENT DISPOSITION:  PACU - hemodynamically stable.   PROCEDURE DETAILS: Dictation # [id number]  Gae Bon, DMD 09/21/2018 8:31 AM

## 2018-09-21 NOTE — Addendum Note (Signed)
Addendum  created 09/21/18 1440 by Scheryl Darter, CRNA   Intraprocedure Event edited

## 2018-09-21 NOTE — Progress Notes (Signed)
Dr Glennon Mac aware of bp 171/100 - no further orders, can d/c and pt to take lisinopril on arrival home

## 2018-09-21 NOTE — Transfer of Care (Signed)
Immediate Anesthesia Transfer of Care Note  Patient: Frank Fischer  Procedure(s) Performed: DENTAL EXTRACTIONS WITH ALVEOLIPLASTY (N/A Mouth)  Patient Location: PACU  Anesthesia Type:General  Level of Consciousness: awake, alert , oriented and sedated  Airway & Oxygen Therapy: Patient Spontanous Breathing and Patient connected to nasal cannula oxygen  Post-op Assessment: Report given to RN, Post -op Vital signs reviewed and stable and Patient moving all extremities  Post vital signs: Reviewed and stable  Last Vitals:  Vitals Value Taken Time  BP 209/112 09/21/2018  8:43 AM  Temp    Pulse 75 09/21/2018  8:46 AM  Resp 15 09/21/2018  8:46 AM  SpO2 97 % 09/21/2018  8:46 AM  Vitals shown include unvalidated device data.  Last Pain:  Vitals:   09/21/18 0602  TempSrc:   PainSc: 0-No pain      Patients Stated Pain Goal: 1 (93/79/02 4097)  Complications: No apparent anesthesia complications

## 2018-09-21 NOTE — H&P (Signed)
HISTORY AND PHYSICAL  Frank Fischer is a 58 y.o. male patient with CC: dental pain  No diagnosis found.  Past Medical History:  Diagnosis Date  . Arthritis   . Cervical stenosis of spine   . Chronic headaches   . Dental caries    periodontal disease  . Frequency of urination   . History of cerebral infarction 11-28-2017  per pt never had symptoms   per MRI imaging 10-31-2016 2 small chronic lacunar infartions in the right frontal periventricular white matter , and chronic left medial orbital fracture (as seen 2014 MRI)  . Hypertension   . Noncompliance with medication regimen   . Prostate cancer [facility]) urologist-  dr winter/  oncologist-  dr Tammi Klippel   dx 07-21-2017--- Stage T1c, Gleason 4+3,  PSA 13.40,  vol 49.7cc--  scheduled for radioactive seed implants 12-03-2017    Current Facility-Administered Medications  Medication Dose Route Frequency Provider Last Rate Last Dose  . 0.9 %  sodium chloride infusion    Continuous PRN Diona Browner, DDS   1,000 mL at 09/21/18 0713  . ceFAZolin (ANCEF) IVPB 2g/100 mL premix  2 g Intravenous On Call to Spencer, Florence, DDS       No Known Allergies Active Problems:   * No active hospital problems. *  Vitals: Blood pressure (!) 175/103, pulse 64, temperature 98.2 F (36.8 C), temperature source Oral, resp. rate 18, height 5\' 10"  (1.778 m), weight 98.4 kg, SpO2 100 %. Lab results: Results for orders placed or performed during the hospital encounter of 09/21/18 (from the past 24 hour(s))  CBC WITH DIFFERENTIAL     Status: None   Collection Time: 09/21/18  5:48 AM  Result Value Ref Range   WBC 5.6 4.0 - 10.5 K/uL   RBC 4.45 4.22 - 5.81 MIL/uL   Hemoglobin 14.6 13.0 - 17.0 g/dL   HCT 43.6 39.0 - 52.0 %   MCV 98.0 80.0 - 100.0 fL   MCH 32.8 26.0 - 34.0 pg   MCHC 33.5 30.0 - 36.0 g/dL   RDW 15.0 11.5 - 15.5 %   Platelets 273 150 - 400 K/uL   nRBC 0.0 0.0 - 0.2 %   Neutrophils Relative % 49 %   Neutro Abs 2.9 1.7 - 7.7 K/uL   Lymphocytes Relative 38 %   Lymphs Abs 2.1 0.7 - 4.0 K/uL   Monocytes Relative 10 %   Monocytes Absolute 0.6 0.1 - 1.0 K/uL   Eosinophils Relative 1 %   Eosinophils Absolute 0.1 0.0 - 0.5 K/uL   Basophils Relative 1 %   Basophils Absolute 0.0 0.0 - 0.1 K/uL   Immature Granulocytes 1 %   Abs Immature Granulocytes 0.03 0.00 - 0.07 K/uL  Basic metabolic panel     Status: Abnormal   Collection Time: 09/21/18  5:48 AM  Result Value Ref Range   Sodium 140 135 - 145 mmol/L   Potassium 3.9 3.5 - 5.1 mmol/L   Chloride 109 98 - 111 mmol/L   CO2 23 22 - 32 mmol/L   Glucose, Bld 109 (H) 70 - 99 mg/dL   BUN 14 6 - 20 mg/dL   Creatinine, Ser 1.17 0.61 - 1.24 mg/dL   Calcium 8.8 (L) 8.9 - 10.3 mg/dL   GFR calc non Af Amer >60 >60 mL/min   GFR calc Af Amer >60 >60 mL/min   Anion gap 8 5 - 15   Radiology Results: No results found. General appearance: alert, cooperative, no distress and  moderately obese Head: Normocephalic, without obvious abnormality, atraumatic Eyes: negative Nose: Nares normal. Septum midline. Mucosa normal. No drainage or sinus tenderness. Throat: mjultiple carious teeth, no purulence, edema, trismus. Pharynx clear Neck: no adenopathy, supple, symmetrical, trachea midline and thyroid not enlarged, symmetric, no tenderness/mass/nodules Resp: clear to auscultation bilaterally Cardio: regular rate and rhythm, S1, S2 normal, no murmur, click, rub or gallop  Assessment: Multiple nonrestorable teeth secondary to dental caries, periodontitis.  Plan: Full mouth dental extractions with alveoloplasty. GA. DAy surgery.    Diona Browner 09/21/2018

## 2018-09-21 NOTE — Op Note (Signed)
NAMEKHAYDEN, HERZBERG MEDICAL RECORD PZ:[id number] ACCOUNT [id number] DATE OF BIRTH:January 12, 1961 FACILITY: [facility] LOCATION: [facility] PH[facility]ICIAN:Cendy Oconnor M. Kayda Allers, DDS  OPERATIVE REPORT  DATE OF PROCEDURE:  09/21/2018  PREOPERATIVE DIAGNOSIS:  Multiple dental abscesses nonrestorable teeth numbers 1, 6, 11, 16, 21, 22, 23, 24, 25, 26, 27, 28, 32.  POSTOPERATIVE DIAGNOSIS:  Multiple dental abscesses nonrestorable teeth numbers 1, 6, 11, 16, 21, 22, 23, 24, 25, 26, 27, 28, 32.  PROCEDURE:  Extraction of teeth numbers 1, 6, 11, 16, 21, 22, 23, 24, 25, 26, 27, 28, 32 alveoplasty right and left mandible.  SURGEON:  Diona Browner, DDS  ANESTHESIA:  General nasal intubation, Tobias Alexander attending.  INDICATIONS:  The patient is a 58 year old male with past medical history for cerebral infarction, hypertension, noncompliance with home medications, prostate cancer.  He was seen in the office for complaint of severe dental pain.  He was examined and  found to have all teeth to be nonrestorable secondary to dental abscess and caries, it was recommended that he undergo the procedure under general anesthesia because of his health concerns and because of the difficulty of the surgery.  DESCRIPTION OF PROCEDURE:  The patient was placed on the table in supine position.  General anesthesia was administered intravenously and a nasal endotracheal tube was placed and secured.  The eyes were protected and the patient was draped for surgery.   A timeout was performed.  The posterior pharynx was suctioned and a throat pack was placed, 2% lidocaine 1:100,000 epinephrine was infiltrated in an inferior alveolar block on the right and left side and in buccal and palatal infiltration around the  maxillary teeth and in the anterior mandible buccal region.  A total of 20 mL was utilized.  A bite block was placed on the right side of the mouth and a sweetheart retractor was used to retract the tongue.  A #15 blade was used to make an  incision  beginning at tooth 21 and was carried in the gingival sulcus all the way across the midline to tooth 28.  This incision was done in the buccal and lingual sulcus.  The periosteum was reflected from these teeth and then the teeth were elevated with a 301  elevator and removed from the mouth with Ash forceps.  All teeth were removed in simple fashion.  The sockets were curetted.  The incisions were extended approximately 1 cm.  The periosteum was reflected to expose the alveolar bone, which was irregular  in contour owing to the periodontal disease which was present.  Then, an egg-shaped bur followed by a bone file was used to perform an alveoplasty and then the area was irrigated and closed with 3-0 chromic.  In the maxilla, #15 blade was used to make an  incision around teeth 11 and 16.  The periosteum was reflected from around these teeth and then the teeth were elevated with a 301 elevator and removed from the mouth with the dental forceps.  The sockets were curetted and irrigated.  There was no need  for suture.  The bite block was repositioned to the other side of the mouth and teeth 1 and 6 were removed using the dental forceps.  The sockets were curetted, irrigated, and no closure was needed.  The oral cavity was irrigated and suctioned and the  throat pack was removed.  The patient was left in care of anesthesia for plans for extubation and transport to recovery room, discharged home through day surgery.  ESTIMATED BLOOD LOSS:  Minimal.  COMPLICATIONS:  None.  SPECIMENS:  None.  TN/NUANCE  D:09/21/2018 T:09/21/2018 JOB:[id number]

## 2018-09-22 ENCOUNTER — Encounter (HOSPITAL_COMMUNITY): Payer: Self-pay | Admitting: Oral Surgery

## 2018-10-28 ENCOUNTER — Ambulatory Visit (HOSPITAL_COMMUNITY)
Admission: EM | Admit: 2018-10-28 | Discharge: 2018-10-28 | Disposition: A | Discharge status: Home / Self Care | Payer: Medicaid Other | Attending: Family Medicine | Admitting: Family Medicine

## 2018-10-28 ENCOUNTER — Encounter (HOSPITAL_COMMUNITY): Payer: Self-pay | Admitting: Emergency Medicine

## 2018-10-28 ENCOUNTER — Other Ambulatory Visit: Payer: Self-pay

## 2018-10-28 DIAGNOSIS — S161XXA Strain of muscle, fascia and tendon at neck level, initial encounter: Secondary | ICD-10-CM

## 2018-10-28 DIAGNOSIS — I10 Essential (primary) hypertension: Secondary | ICD-10-CM

## 2018-10-28 DIAGNOSIS — M79645 Pain in left finger(s): Secondary | ICD-10-CM | POA: Diagnosis not present

## 2018-10-28 MED ORDER — METHOCARBAMOL 500 MG PO TABS: 500.0000 mg | ORAL_TABLET | Freq: Two times a day (BID) | ORAL | 0 refills | Status: DC

## 2018-10-28 MED ORDER — ACETAMINOPHEN 500 MG PO TABS: 500.0000 mg | ORAL_TABLET | Freq: Four times a day (QID) | ORAL | 0 refills | Status: DC | PRN

## 2018-10-28 NOTE — ED Triage Notes (Signed)
Left thumb pain since Sunday.  Thumb is swollen.  ? Insect bite to pad of thumb.  Patient took benadryl yesterday  Neck pain started yesterday.

## 2018-10-28 NOTE — Discharge Instructions (Addendum)
Take the medication as prescribed For now we will just watch the finger.  You can put some ice on it to see if this helps.  If it becomes more swollen, painful or red then you need to follow-up. Follow up as needed for continued or worsening symptoms

## 2018-10-28 NOTE — ED Provider Notes (Signed)
[facility]    CSN: [id number] Arrival date & time: 10/28/18  1048     History   Chief Complaint Chief Complaint  Patient presents with   Neck Pain    HPI Frank Fischer is a 58 y.o. male.   Pt is a 58 year old male that presents with multiple complaints. He has been having left thumb pain  X 4 days. The thumb is slightly swollen. No terrible painful. Not sure if something bit him. No injury. No redness or drainage. No fevers.  Pt also have neck pain since yesterday. He woke up with this pain. Symptoms have been constant. Trouble rotating the neck. No injury. He has not taken anything for the pain. No numbness, tingling, weakness.   ROS per HPI      Past Medical History:  Diagnosis Date   Arthritis    Cervical stenosis of spine    Chronic headaches    Dental caries    periodontal disease   Frequency of urination    History of cerebral infarction 11-28-2017  per pt never had symptoms   per MRI imaging 10-31-2016 2 small chronic lacunar infartions in the right frontal periventricular white matter , and chronic left medial orbital fracture (as seen 2014 MRI)   Hypertension    Noncompliance with medication regimen    Prostate cancer [facility]) urologist-  dr winter/  oncologist-  dr Tammi Klippel   dx 07-21-2017--- Stage T1c, Gleason 4+3,  PSA 13.40,  vol 49.7cc--  scheduled for radioactive seed implants 12-03-2017    Patient Active Problem List   Diagnosis Date Noted   Malignant neoplasm of prostate ([place]) 10/02/2017   Noncompliance with medication regimen 12/18/2016   Chronic tension type headache 10/20/2016   Headache(784.0) 10/19/2013   Itching 10/19/2013   Smoker 10/19/2013   Essential hypertension, benign 07/08/2013   Pain in joint, shoulder region 07/08/2013   Cervical disc disorder with radiculopathy of cervical region 07/08/2013   Dental caries 07/08/2013   Spinal stenosis in cervical region 07/07/2013   Tendinitis of left rotator  cuff 07/07/2013    Past Surgical History:  Procedure Laterality Date   ABDOMINAL EXPLORATION SURGERY  age 89   per pt had Small Bowel Resection and Appendectomy   APPENDECTOMY     ARM SURGERY Left 1999   REPAIR INJURY-- "ARM CUT ABOUT OFF"   PROSTATE BIOPSY  07/21/2017  dr winter office   RADIOACTIVE SEED IMPLANT N/A 12/03/2017   Procedure: RADIOACTIVE SEED IMPLANT/BRACHYTHERAPY IMPLANT;  Surgeon: Ceasar Mons, MD;  Location: [facility];  Service: Urology;  Laterality: N/A;  ONLY NEEDS 120 MIN   SPACE OAR INSTILLATION N/A 12/03/2017   Procedure: SPACE OAR INSTILLATION;  Surgeon: Ceasar Mons, MD;  Location: [facility];  Service: Urology;  Laterality: N/A;   TOOTH EXTRACTION N/A 09/21/2018   Procedure: DENTAL EXTRACTIONS WITH ALVEOLIPLASTY;  Surgeon: Diona Browner, DDS;  Location: [place];  Service: Oral Surgery;  Laterality: N/A;       Home Medications    Prior to Admission medications   Medication Sig Start Date End Date Taking? Authorizing Provider  lisinopril (PRINIVIL,ZESTRIL) 40 MG tablet Take 1 tablet (40 mg total) by mouth daily. Patient taking differently: Take 40 mg by mouth every morning.  10/19/13  Yes Advani, Vernon Prey, MD  acetaminophen (TYLENOL) 500 MG tablet Take 1 tablet (500 mg total) by mouth every 6 (six) hours as needed. 10/28/18   Orvan July, NP  ibuprofen (ADVIL) 800  MG tablet Take 800 mg by mouth every 6 (six) hours as needed (pain).    [provider]  methocarbamol (ROBAXIN) 500 MG tablet Take 1 tablet (500 mg total) by mouth 2 (two) times daily. 10/28/18   Orvan July, NP    Family History Family History  Problem Relation Age of Onset   Cancer Mother    Diabetes Paternal Grandfather     Social History Social History   Tobacco Use   Smoking status: Current Every Day Smoker    Packs/day: 0.50    Years: 40.00    Pack years: 20.00    Types: Cigarettes   Smokeless tobacco: Never  Used  Substance Use Topics   Alcohol use: Yes    Alcohol/week: 21.0 standard drinks    Types: 21 Cans of beer per week    Comment: 1-2 12oz  beers every evening   Drug use: Yes    Types: Marijuana    Comment:  per pt uses once week     Allergies   Penicillins   Review of Systems Review of Systems   Physical Exam Triage Vital Signs ED Triage Vitals  Enc Vitals Group     BP 10/28/18 1108 (!) 134/92     Pulse Rate 10/28/18 1108 85     Resp 10/28/18 1108 18     Temp 10/28/18 1108 97.8 F (36.6 C)     Temp Source 10/28/18 1108 Oral     SpO2 10/28/18 1108 95 %     Weight --      Height --      Head Circumference --      Peak Flow --      Pain Score 10/28/18 1106 8     Pain Loc --      Pain Edu? --      Excl. in [place]? --    No data found.  Updated Vital Signs BP (!) 134/92 (BP Location: Left Arm)    Pulse 85    Temp 97.8 F (36.6 C) (Oral)    Resp 18    SpO2 95%   Visual Acuity Right Eye Distance:   Left Eye Distance:   Bilateral Distance:    Right Eye Near:   Left Eye Near:    Bilateral Near:     Physical Exam Vitals signs and nursing note reviewed.  Constitutional:      General: He is not in acute distress.    Appearance: Normal appearance. He is not ill-appearing, toxic-appearing or diaphoretic.  HENT:     Head: Normocephalic and atraumatic.     Nose: Nose normal.  Eyes:     Conjunctiva/sclera: Conjunctivae normal.  Neck:     Musculoskeletal: Neck supple. Pain with movement and muscular tenderness present.     Comments: Tenderness to cervical paravertebral musculature.  No bony tenderness.  Limited rotation of the neck.  Good flexion and extension.  Pulmonary:     Effort: Pulmonary effort is normal.  Musculoskeletal:     Cervical back: He exhibits tenderness. He exhibits no bony tenderness, no swelling, no edema, no deformity, no laceration, no pain and no spasm.     Comments: Left thumb slightly swollen.  Nontender to palpation of the thumb.  Able  to flex and extend the thumb.  No redness, bruising or deformity.  No obvious bite marks or drainage.  Lymphadenopathy:     Cervical: No cervical adenopathy.  Neurological:     Mental Status: He is alert.  UC Treatments / Results  Labs (all labs ordered are listed, but only abnormal results are displayed) Labs Reviewed - No data to display  EKG None  Radiology No results found.  Procedures Procedures (including critical care time)  Medications Ordered in UC Medications - No data to display  Initial Impression / Assessment and Plan / UC Course  I have reviewed the triage vital signs and the nursing notes.  Pertinent labs & imaging results that were available during my care of the patient were reviewed by me and considered in my medical decision making (see chart for details).     Cervical strain- treating with tylenol and robaxin Gentle stretching, heat, massage.   Thumb swelling- monitor and try ice to thumb Follow up as needed for continued or worsening symptoms   Final Clinical Impressions(s) / UC Diagnoses   Final diagnoses:  Acute strain of neck muscle, initial encounter  Finger pain, left     Discharge Instructions     Take the medication as prescribed For now we will just watch the finger.  You can put some ice on it to see if this helps.  If it becomes more swollen, painful or red then you need to follow-up. Follow up as needed for continued or worsening symptoms     ED Prescriptions    Medication Sig Dispense Auth. Provider   acetaminophen (TYLENOL) 500 MG tablet Take 1 tablet (500 mg total) by mouth every 6 (six) hours as needed. 30 tablet Polly Barner A, NP   methocarbamol (ROBAXIN) 500 MG tablet Take 1 tablet (500 mg total) by mouth 2 (two) times daily. 20 tablet Loura Halt A, NP     Controlled Substance Prescriptions [place] Controlled Substance Registry consulted? Not Applicable   Orvan July, NP 10/28/18 1157

## 2018-10-29 ENCOUNTER — Emergency Department (HOSPITAL_COMMUNITY)
Admission: EM | Admit: 2018-10-29 | Discharge: 2018-10-29 | Disposition: A | Discharge status: Home / Self Care | Payer: Medicaid Other | Attending: Emergency Medicine | Admitting: Emergency Medicine

## 2018-10-29 ENCOUNTER — Emergency Department (HOSPITAL_COMMUNITY): Payer: Medicaid Other

## 2018-10-29 ENCOUNTER — Encounter (HOSPITAL_COMMUNITY): Payer: Self-pay | Admitting: Emergency Medicine

## 2018-10-29 ENCOUNTER — Other Ambulatory Visit: Payer: Self-pay

## 2018-10-29 DIAGNOSIS — M62838 Other muscle spasm: Secondary | ICD-10-CM | POA: Diagnosis not present

## 2018-10-29 DIAGNOSIS — F17210 Nicotine dependence, cigarettes, uncomplicated: Secondary | ICD-10-CM | POA: Diagnosis not present

## 2018-10-29 DIAGNOSIS — S161XXA Strain of muscle, fascia and tendon at neck level, initial encounter: Secondary | ICD-10-CM | POA: Diagnosis not present

## 2018-10-29 DIAGNOSIS — Y999 Unspecified external cause status: Secondary | ICD-10-CM | POA: Insufficient documentation

## 2018-10-29 DIAGNOSIS — X58XXXA Exposure to other specified factors, initial encounter: Secondary | ICD-10-CM | POA: Diagnosis not present

## 2018-10-29 DIAGNOSIS — I10 Essential (primary) hypertension: Secondary | ICD-10-CM | POA: Insufficient documentation

## 2018-10-29 DIAGNOSIS — Z79899 Other long term (current) drug therapy: Secondary | ICD-10-CM | POA: Diagnosis not present

## 2018-10-29 DIAGNOSIS — Y939 Activity, unspecified: Secondary | ICD-10-CM | POA: Diagnosis not present

## 2018-10-29 DIAGNOSIS — Y929 Unspecified place or not applicable: Secondary | ICD-10-CM | POA: Diagnosis not present

## 2018-10-29 DIAGNOSIS — M542 Cervicalgia: Secondary | ICD-10-CM | POA: Diagnosis present

## 2018-10-29 MED ORDER — TRAMADOL HCL 50 MG PO TABS: 50.0000 mg | ORAL_TABLET | Freq: Four times a day (QID) | ORAL | 0 refills | Status: DC | PRN

## 2018-10-29 MED ORDER — KETOROLAC TROMETHAMINE 60 MG/2ML IM SOLN
60.0000 mg | Freq: Once | INTRAMUSCULAR | Status: AC
  Administered 2018-10-29: 60 mg via INTRAMUSCULAR
  Filled 2018-10-29: qty 2

## 2018-10-29 MED ORDER — PREDNISONE 50 MG PO TABS: 50.0000 mg | ORAL_TABLET | Freq: Every day | ORAL | 0 refills | Status: DC

## 2018-10-29 NOTE — ED Provider Notes (Signed)
[place] DEPT Provider Note   CSN: [id number] Arrival date & time: 10/29/18  7989     History   Chief Complaint Chief Complaint  Patient presents with  . Neck Pain    HPI Frank Fischer is a 58 y.o. male.     HPI Patient presents to the emergency department with bilateral neck pain that started 2 days ago.  The patient states he was seen for this yesterday.  Patient states he thinks something bit him on the left as well.  Patient states that he did not take any medications prior to arrival for his symptoms.  Patient states that movement and palpation makes the pain worse.  The patient denies chest pain, shortness of breath, headache,blurred vision,fever, cough, weakness, numbness, dizziness, anorexia, edema, abdominal pain, nausea, vomiting, diarrhea, rash, back pain, dysuria, hematemesis, bloody stool, near syncope, or syncope. Past Medical History:  Diagnosis Date  . Arthritis   . Cervical stenosis of spine   . Chronic headaches   . Dental caries    periodontal disease  . Frequency of urination   . History of cerebral infarction 11-28-2017  per pt never had symptoms   per MRI imaging 10-31-2016 2 small chronic lacunar infartions in the right frontal periventricular white matter , and chronic left medial orbital fracture (as seen 2014 MRI)  . Hypertension   . Noncompliance with medication regimen   . Prostate cancer [facility]) urologist-  dr winter/  oncologist-  dr Tammi Klippel   dx 07-21-2017--- Stage T1c, Gleason 4+3,  PSA 13.40,  vol 49.7cc--  scheduled for radioactive seed implants 12-03-2017    Patient Active Problem List   Diagnosis Date Noted  . Malignant neoplasm of prostate ([place]) 10/02/2017  . Noncompliance with medication regimen 12/18/2016  . Chronic tension type headache 10/20/2016  . Headache(784.0) 10/19/2013  . Itching 10/19/2013  . Smoker 10/19/2013  . Essential hypertension, benign 07/08/2013  . Pain in joint, shoulder region  07/08/2013  . Cervical disc disorder with radiculopathy of cervical region 07/08/2013  . Dental caries 07/08/2013  . Spinal stenosis in cervical region 07/07/2013  . Tendinitis of left rotator cuff 07/07/2013    Past Surgical History:  Procedure Laterality Date  . ABDOMINAL EXPLORATION SURGERY  age 25   per pt had Small Bowel Resection and Appendectomy  . APPENDECTOMY    . ARM SURGERY Left 1999   REPAIR INJURY-- "ARM CUT ABOUT OFF"  . PROSTATE BIOPSY  07/21/2017  dr winter office  . RADIOACTIVE SEED IMPLANT N/A 12/03/2017   Procedure: RADIOACTIVE SEED IMPLANT/BRACHYTHERAPY IMPLANT;  Surgeon: Ceasar Mons, MD;  Location: [facility];  Service: Urology;  Laterality: N/A;  ONLY NEEDS 120 MIN  . SPACE OAR INSTILLATION N/A 12/03/2017   Procedure: SPACE OAR INSTILLATION;  Surgeon: Ceasar Mons, MD;  Location[facility];  Service: Urology;  Laterality: N/A;  . TOOTH EXTRACTION N/A 09/21/2018   Procedure: DENTAL EXTRACTIONS WITH ALVEOLIPLASTY;  Surgeon: Diona Browner, DDS;  Location: [place];  Service: Oral Surgery;  Laterality: N/A;        Home Medications    Prior to Admission medications   Medication Sig Start Date End Date Taking? Authorizing Provider  albuterol (VENTOLIN HFA) 108 (90 Base) MCG/ACT inhaler Inhale 2 puffs into the lungs 4 (four) times daily. 10/22/18  Yes [provider]  lisinopril (PRINIVIL,ZESTRIL) 40 MG tablet Take 1 tablet (40 mg total) by mouth daily. Patient taking differently: Take 40 mg by mouth every  morning.  10/19/13  Yes Advani, Vernon Prey, MD  methocarbamol (ROBAXIN) 500 MG tablet Take 1 tablet (500 mg total) by mouth 2 (two) times daily. 10/28/18  Yes Bast, Traci A, NP  acetaminophen (TYLENOL) 500 MG tablet Take 1 tablet (500 mg total) by mouth every 6 (six) hours as needed. Patient not taking: Reported on 10/29/2018 10/28/18   Orvan July, NP    Family History Family History  Problem Relation Age  of Onset  . Cancer Mother   . Diabetes Paternal Grandfather     Social History Social History   Tobacco Use  . Smoking status: Current Every Day Smoker    Packs/day: 0.50    Years: 40.00    Pack years: 20.00    Types: Cigarettes  . Smokeless tobacco: Never Used  Substance Use Topics  . Alcohol use: Yes    Alcohol/week: 21.0 standard drinks    Types: 21 Cans of beer per week    Comment: 1-2 12oz  beers every evening  . Drug use: Yes    Types: Marijuana    Comment:  per pt uses once week     Allergies   Penicillins   Review of Systems Review of Systems All other systems negative except as documented in the HPI. All pertinent positives and negatives as reviewed in the HPI. Physical Exam Updated Vital Signs BP (!) 162/95   Pulse (!) 57   Temp 99 F (37.2 C) (Oral)   Resp 18   Ht 5\' 10"  (1.778 m)   Wt 96.2 kg   SpO2 96%   BMI 30.42 kg/m   Physical Exam Vitals signs and nursing note reviewed.  Constitutional:      General: He is not in acute distress.    Appearance: He is well-developed.  HENT:     Head: Normocephalic and atraumatic.  Eyes:     Pupils: Pupils are equal, round, and reactive to light.  Neck:     Musculoskeletal: Neck supple. Normal range of motion. Pain with movement and muscular tenderness present. No neck rigidity, crepitus, injury, torticollis or spinous process tenderness.  Cardiovascular:     Rate and Rhythm: Normal rate and regular rhythm.     Heart sounds: Normal heart sounds. No murmur. No friction rub. No gallop.   Pulmonary:     Effort: Pulmonary effort is normal. No respiratory distress.     Breath sounds: Normal breath sounds. No wheezing.  Abdominal:     General: Bowel sounds are normal. There is no distension.     Palpations: Abdomen is soft.     Tenderness: There is no abdominal tenderness.  Musculoskeletal:     Cervical back: He exhibits decreased range of motion, tenderness, pain and spasm. He exhibits no bony tenderness,  no swelling, no edema, no deformity, no laceration and normal pulse.  Skin:    General: Skin is warm and dry.     Capillary Refill: Capillary refill takes less than 2 seconds.     Findings: No erythema or rash.  Neurological:     Mental Status: He is alert and oriented to person, place, and time.     Motor: No abnormal muscle tone.     Coordination: Coordination normal.  Psychiatric:        Behavior: Behavior normal.      ED Treatments / Results  Labs (all labs ordered are listed, but only abnormal results are displayed) Labs Reviewed - No data to display  EKG    Radiology Ct  Cervical Spine Wo Contrast  Result Date: 10/29/2018 CLINICAL DATA:  58 year old male complaining of pain in the neck since waking up. EXAM: CT CERVICAL SPINE WITHOUT CONTRAST TECHNIQUE: Multidetector CT imaging of the cervical spine was performed without intravenous contrast. Multiplanar CT image reconstructions were also generated. COMPARISON:  No priors. FINDINGS: Alignment: Mild reversal of cervical lordosis centered at the level of C5, presumably positional. Alignment is otherwise anatomic. Skull base and vertebrae: No acute fracture. No primary bone lesion or focal pathologic process. Soft tissues and spinal canal: No prevertebral fluid or swelling. No visible canal hematoma. Disc levels: Multilevel degenerative disc disease, most severe at C5-C6 and C6-C7. Mild multilevel facet arthropathy. Upper chest: Negative. Other: None. IMPRESSION: 1. No acute abnormality of the cervical spine. 2. Mild multilevel degenerative disc disease and cervical spondylosis, as above. Electronically Signed   By: Vinnie Langton M.D.   On: 10/29/2018 10:15    Procedures Procedures (including critical care time)  Medications Ordered in ED Medications  ketorolac (TORADOL) injection 60 mg (60 mg Intramuscular Given 10/29/18 1011)     Initial Impression / Assessment and Plan / ED Course  I have reviewed the triage vital signs  and the nursing notes.  Pertinent labs & imaging results that were available during my care of the patient were reviewed by me and considered in my medical decision making (see chart for details).        Patient be treated for spasm and strain of the neck.  The patient does not have any nuchal rigidity and does have full range of motion but pain with range of motion.  Patient will be advised to return here as needed.  Told to use ice and heat on his neck.  Patient has normal strength and range of motion in his extremities.  He has normal sensation as well.  Final Clinical Impressions(s) / ED Diagnoses   Final diagnoses:  None    ED Discharge Orders    None       Dalia Heading, PA-C 10/29/18 1109    Drenda Freeze, MD 10/29/18 1504

## 2018-10-29 NOTE — Discharge Instructions (Addendum)
Return here as needed.  Use ice and heat on your neck.  Your CT scan did not show any significant abnormalities at this time.

## 2018-10-29 NOTE — ED Triage Notes (Signed)
Patient complaining of waking up with neck pain. Patient states he did not hurt himself.

## 2018-11-11 ENCOUNTER — Other Ambulatory Visit: Payer: Self-pay

## 2018-11-11 ENCOUNTER — Encounter (HOSPITAL_COMMUNITY): Payer: Self-pay

## 2018-11-11 ENCOUNTER — Ambulatory Visit (HOSPITAL_COMMUNITY)
Admission: EM | Admit: 2018-11-11 | Discharge: 2018-11-11 | Disposition: A | Discharge status: Home / Self Care | Payer: Medicaid Other | Attending: Family Medicine | Admitting: Family Medicine

## 2018-11-11 DIAGNOSIS — M79645 Pain in left finger(s): Secondary | ICD-10-CM

## 2018-11-11 MED ORDER — PREDNISONE 10 MG (21) PO TBPK: ORAL_TABLET | Freq: Every day | ORAL | 0 refills | Status: DC

## 2018-11-11 NOTE — ED Triage Notes (Signed)
Pt states he has swelling in his left hand thumb.  Pt states he has been here for this before.

## 2018-11-12 NOTE — ED Provider Notes (Signed)
[name]   [id number] 11/11/18 Arrival Time: 6546  ASSESSMENT & PLAN:  1. Thumb pain, left    Likely overuse/strain. No trauma. No indication for imaging at this time.  Meds ordered this encounter  Medications  . predniSONE (STERAPRED UNI-PAK 21 TAB) 10 MG (21) TBPK tablet    Sig: Take by mouth daily. Take as directed.    Dispense:  21 tablet    Refill:  0   OTC ibuprofen with food.  Follow-up Information    [place].   Specialty: Urgent Care Why: As needed. Contact information: [place] [phone]         Reviewed expectations re: course of current medical issues. Questions answered. Outlined signs and symptoms indicating need for more acute intervention. Patient verbalized understanding. After Visit Summary given.  SUBJECTIVE: History from: patient. Frank Fischer is a 58 y.o. male who reports fairly persistent mild to moderate pain of his left thumb/hand; described as aching without radiation. Onset: gradual, over the past couple of weeks. Injury/trama: no. Symptoms have progressed to a point and plateaued since beginning. Aggravating factors: movement/gripping. Alleviating factors: rest. Associated symptoms: none reported. Extremity sensation changes or weakness: none. Self treatment: tried OTCs without relief of pain; 'maybe help a little' History of similar: yes.  Past Surgical History:  Procedure Laterality Date  . ABDOMINAL EXPLORATION SURGERY  age 57   per pt had Small Bowel Resection and Appendectomy  . APPENDECTOMY    . ARM SURGERY Left 1999   REPAIR INJURY-- "ARM CUT ABOUT OFF"  . PROSTATE BIOPSY  07/21/2017  dr winter office  . RADIOACTIVE SEED IMPLANT N/A 12/03/2017   Procedure: RADIOACTIVE SEED IMPLANT/BRACHYTHERAPY IMPLANT;  Surgeon: Ceasar Mons, MD;  Location: [facility];  Service: Urology;  Laterality: N/A;  ONLY  NEEDS 120 MIN  . SPACE OAR INSTILLATION N/A 12/03/2017   Procedure: SPACE OAR INSTILLATION;  Surgeon: Ceasar Mons, MD;  [facility];  Service: Urology;  Laterality: N/A;  . TOOTH EXTRACTION N/A 09/21/2018   Procedure: DENTAL EXTRACTIONS WITH ALVEOLIPLASTY;  Surgeon: Diona Browner, DDS;  Location: [place];  Service: Oral Surgery;  Laterality: N/A;     ROS: As per HPI. All other systems negative.    OBJECTIVE:  Vitals:   11/11/18 1439 11/11/18 1441  BP:  (!) 145/90  Pulse:  88  Resp:  18  Temp:  98.4 F (36.9 C)  SpO2:  98%  Weight: 96.2 kg     General appearance: alert; no distress HEENT: [place]; AT Neck: supple with FROM Resp: unlabored respirations Extremities: . LUE: warm and well perfused; fairly well localized mild to moderate tenderness over left proximal thumb and thenar musculature extending to wrist; without gross deformities; with no swelling; with no bruising; ROM: normal with reported discomfort CV: brisk extremity capillary refill of LUE; 2+ radial pulse of LUE. Skin: warm and dry; no visible rashes Neurologic: gait normal; normal reflexes of RUE and LUE; normal sensation of RUE and LUE; normal strength of RUE and LUE Psychological: alert and cooperative; normal mood and affect   Allergies  Allergen Reactions  . Penicillins Itching    Did it involve swelling of the face/tongue/throat, SOB, or low BP? No Did it involve sudden or severe rash/hives, skin peeling, or any reaction on the inside of your mouth or nose? Yes Did you need to seek medical attention at a hospital or doctor's office?  No When did it last happen?09/2018 If all above answers are "NO", may proceed with cephalosporin use.    Past Medical History:  Diagnosis Date  . Arthritis   . Cervical stenosis of spine   . Chronic headaches   . Dental caries    periodontal disease  . Frequency of urination   . History of cerebral infarction 11-28-2017  per pt never  had symptoms   per MRI imaging 10-31-2016 2 small chronic lacunar infartions in the right frontal periventricular white matter , and chronic left medial orbital fracture (as seen 2014 MRI)  . Hypertension   . Noncompliance with medication regimen   . Prostate cancer [facility]) urologist-  dr winter/  oncologist-  dr Tammi Klippel   dx 07-21-2017--- Stage T1c, Gleason 4+3,  PSA 13.40,  vol 49.7cc--  scheduled for radioactive seed implants 12-03-2017   Social History   Socioeconomic History  . Marital status: Single    Spouse name: Not on file  . Number of children: 2  . Years of education: 38  . Highest education level: Not on file  Occupational History  . Occupation: N/A  Social Needs  . Financial resource strain: Not on file  . Food insecurity    Worry: Not on file    Inability: Not on file  . Transportation needs    Medical: Not on file    Non-medical: Not on file  Tobacco Use  . Smoking status: Current Every Day Smoker    Packs/day: 0.50    Years: 40.00    Pack years: 20.00    Types: Cigarettes  . Smokeless tobacco: Never Used  Substance and Sexual Activity  . Alcohol use: Yes    Alcohol/week: 21.0 standard drinks    Types: 21 Cans of beer per week    Comment: 1-2 12oz  beers every evening  . Drug use: Yes    Types: Marijuana    Comment:  per pt uses once week  . Sexual activity: Yes  Lifestyle  . Physical activity    Days per week: Not on file    Minutes per session: Not on file  . Stress: Not on file  Relationships  . Social Herbalist on phone: Not on file    Gets together: Not on file    Attends religious service: Not on file    Active member of club or organization: Not on file    Attends meetings of clubs or organizations: Not on file    Relationship status: Not on file  Other Topics Concern  . Not on file  Social History Narrative   Lives at home alone   Right-handed   Caffeine: tea throughout the day   Family History  Problem Relation Age of  Onset  . Cancer Mother   . Diabetes Paternal Grandfather    Past Surgical History:  Procedure Laterality Date  . ABDOMINAL EXPLORATION SURGERY  age 84   per pt had Small Bowel Resection and Appendectomy  . APPENDECTOMY    . ARM SURGERY Left 1999   REPAIR INJURY-- "ARM CUT ABOUT OFF"  . PROSTATE BIOPSY  07/21/2017  dr winter office  . RADIOACTIVE SEED IMPLANT N/A 12/03/2017   Procedure: RADIOACTIVE SEED IMPLANT/BRACHYTHERAPY IMPLANT;  Surgeon: Ceasar Mons, MD;  Location: [facility];  Service: Urology;  Laterality: N/A;  ONLY NEEDS 120 MIN  . SPACE OAR INSTILLATION N/A 12/03/2017   Procedure: SPACE OAR INSTILLATION;  Surgeon: Ceasar Mons, MD;  Location:  [place];  Service: Urology;  Laterality: N/A;  . TOOTH EXTRACTION N/A 09/21/2018   Procedure: DENTAL EXTRACTIONS WITH ALVEOLIPLASTY;  Surgeon: Diona Browner, DDS;  Location: [place];  Service: Oral Surgery;  Laterality: N/AVanessa Kick, MD 11/12/18 1110

## 2018-12-02 ENCOUNTER — Other Ambulatory Visit: Payer: Self-pay

## 2018-12-02 ENCOUNTER — Ambulatory Visit (INDEPENDENT_AMBULATORY_CARE_PROVIDER_SITE_OTHER): Payer: Medicaid Other | Admitting: Family Medicine

## 2018-12-02 ENCOUNTER — Ambulatory Visit: Payer: Self-pay

## 2018-12-02 ENCOUNTER — Encounter: Payer: Self-pay | Admitting: Family Medicine

## 2018-12-02 DIAGNOSIS — M79645 Pain in left finger(s): Secondary | ICD-10-CM

## 2018-12-02 NOTE — Progress Notes (Signed)
Office Visit Note   Patient: Frank Fischer           Date of Birth: 22-Sep-1960           MRN: [id number] Visit Date: 12/02/2018 Requested by: No referring provider defined for this encounter. PCP: Patient, No Pcp Per  Subjective: Chief Complaint  Patient presents with  . Left Thumb - Pain    Pain and swelling x 6 weeks, NKI. Went to urgent care x 2. Was given prednisone pack - no better with this. Right-hand dominant.     HPI: He is a right-hand-dominant male who is left thumb pain.  Symptoms started little over a month ago, no injury.  He woke up with pain and stiffness.  He went to the urgent care twice, was given prednisone once which did not help.  Denies fevers or chills.  No previous problems with his stone, no history of gout.              ROS:  All other systems were reviewed and are negative.  Objective: Vital Signs: There were no vitals taken for this visit.  Physical Exam:  General:  Alert and oriented, in no acute distress. Pulm:  Breathing unlabored. Psy:  Normal mood, congruent affect. Skin: No rash on his skin. Left thumb: There is visible swelling in the IP joint.  Limited active range of motion because of pain, but flexor and extensor tendon functions are intact.  Imaging: X-rays left thumb: Very mild arthritis in the IP joint, no other bony abnormality.  Limited diagnostic ultrasound: He has a small joint effusion with diffuse hyperemia on power Doppler imaging.    Assessment & Plan: 1.  Left thumb synovitis, etiology uncertain. -Discussed options with patient and elected to attempt aspiration and injection.  Unable to aspirate any fluid but did inject some methylprednisolone.  He will call me in the next couple days if not improving, and we will order MRI scan.     Procedures: Ultrasound-guided left thumb aspiration injection: After sterile prep with Betadine, injected 1 cc 1% lidocaine without epinephrine into the radial side joint recess, then  attempted aspiration but no fluid was obtained, then injected 20 mg methylprednisolone.    PMFS History: Patient Active Problem List   Diagnosis Date Noted  . Malignant neoplasm of prostate ([place]) 10/02/2017  . Noncompliance with medication regimen 12/18/2016  . Chronic tension type headache 10/20/2016  . Headache(784.0) 10/19/2013  . Itching 10/19/2013  . Smoker 10/19/2013  . Essential hypertension, benign 07/08/2013  . Pain in joint, shoulder region 07/08/2013  . Cervical disc disorder with radiculopathy of cervical region 07/08/2013  . Dental caries 07/08/2013  . Spinal stenosis in cervical region 07/07/2013  . Tendinitis of left rotator cuff 07/07/2013   Past Medical History:  Diagnosis Date  . Arthritis   . Cervical stenosis of spine   . Chronic headaches   . Dental caries    periodontal disease  . Frequency of urination   . History of cerebral infarction 11-28-2017  per pt never had symptoms   per MRI imaging 10-31-2016 2 small chronic lacunar infartions in the right frontal periventricular white matter , and chronic left medial orbital fracture (as seen 2014 MRI)  . Hypertension   . Noncompliance with medication regimen   . Prostate cancer [facility]) urologist-  dr winter/  oncologist-  dr Tammi Klippel   dx 07-21-2017--- Stage T1c, Gleason 4+3,  PSA 13.40,  vol 49.7cc--  scheduled for radioactive seed implants 12-03-2017  Family History  Problem Relation Age of Onset  . Cancer Mother   . Diabetes Paternal Grandfather     Past Surgical History:  Procedure Laterality Date  . ABDOMINAL EXPLORATION SURGERY  age 19   per pt had Small Bowel Resection and Appendectomy  . APPENDECTOMY    . ARM SURGERY Left 1999   REPAIR INJURY-- "ARM CUT ABOUT OFF"  . PROSTATE BIOPSY  07/21/2017  dr winter office  . RADIOACTIVE SEED IMPLANT N/A 12/03/2017   Procedure: RADIOACTIVE SEED IMPLANT/BRACHYTHERAPY IMPLANT;  Surgeon: Ceasar Mons, MD;  Location: [facility];   Service: Urology;  Laterality: N/A;  ONLY NEEDS 120 MIN  . SPACE OAR INSTILLATION N/A 12/03/2017   Procedure: SPACE OAR INSTILLATION;  Surgeon: Ceasar Mons, MD;  Location: [facility];  Service: Urology;  Laterality: N/A;  . TOOTH EXTRACTION N/A 09/21/2018   Procedure: DENTAL EXTRACTIONS WITH ALVEOLIPLASTY;  Surgeon: Diona Browner, DDS;  Location: [place];  Service: Oral Surgery;  Laterality: N/A;   Social History   Occupational History  . Occupation: N/A  Tobacco Use  . Smoking status: Current Every Day Smoker    Packs/day: 0.50    Years: 40.00    Pack years: 20.00    Types: Cigarettes  . Smokeless tobacco: Never Used  Substance and Sexual Activity  . Alcohol use: Yes    Alcohol/week: 21.0 standard drinks    Types: 21 Cans of beer per week    Comment: 1-2 12oz  beers every evening  . Drug use: Yes    Types: Marijuana    Comment:  per pt uses once week  . Sexual activity: Yes

## 2018-12-08 ENCOUNTER — Ambulatory Visit: Payer: Medicaid Other | Admitting: Family Medicine

## 2018-12-08 ENCOUNTER — Ambulatory Visit: Payer: Medicaid Other | Admitting: Orthopedic Surgery

## 2019-02-25 ENCOUNTER — Emergency Department (HOSPITAL_COMMUNITY): Payer: Medicaid Other

## 2019-02-25 ENCOUNTER — Encounter (HOSPITAL_COMMUNITY): Payer: Self-pay | Admitting: Emergency Medicine

## 2019-02-25 ENCOUNTER — Other Ambulatory Visit: Payer: Self-pay

## 2019-02-25 ENCOUNTER — Inpatient Hospital Stay (HOSPITAL_COMMUNITY)
Admission: EM | Admit: 2019-02-25 | Discharge: 2019-02-26 | DRG: 064 | Disposition: A | Discharge status: Home / Self Care | Payer: Medicaid Other | Attending: Internal Medicine | Admitting: Internal Medicine

## 2019-02-25 DIAGNOSIS — Z20828 Contact with and (suspected) exposure to other viral communicable diseases: Secondary | ICD-10-CM | POA: Diagnosis present

## 2019-02-25 DIAGNOSIS — T39396A Underdosing of other nonsteroidal anti-inflammatory drugs [NSAID], initial encounter: Secondary | ICD-10-CM | POA: Diagnosis present

## 2019-02-25 DIAGNOSIS — Z8546 Personal history of malignant neoplasm of prostate: Secondary | ICD-10-CM

## 2019-02-25 DIAGNOSIS — R402252 Coma scale, best verbal response, oriented, at arrival to emergency department: Secondary | ICD-10-CM | POA: Diagnosis present

## 2019-02-25 DIAGNOSIS — Z880 Allergy status to penicillin: Secondary | ICD-10-CM

## 2019-02-25 DIAGNOSIS — Z7982 Long term (current) use of aspirin: Secondary | ICD-10-CM

## 2019-02-25 DIAGNOSIS — F17210 Nicotine dependence, cigarettes, uncomplicated: Secondary | ICD-10-CM | POA: Diagnosis present

## 2019-02-25 DIAGNOSIS — F1410 Cocaine abuse, uncomplicated: Secondary | ICD-10-CM | POA: Diagnosis present

## 2019-02-25 DIAGNOSIS — R29898 Other symptoms and signs involving the musculoskeletal system: Secondary | ICD-10-CM

## 2019-02-25 DIAGNOSIS — Y92009 Unspecified place in unspecified non-institutional (private) residence as the place of occurrence of the external cause: Secondary | ICD-10-CM

## 2019-02-25 DIAGNOSIS — G8324 Monoplegia of upper limb affecting left nondominant side: Secondary | ICD-10-CM | POA: Diagnosis present

## 2019-02-25 DIAGNOSIS — Z79899 Other long term (current) drug therapy: Secondary | ICD-10-CM

## 2019-02-25 DIAGNOSIS — I63231 Cerebral infarction due to unspecified occlusion or stenosis of right carotid arteries: Principal | ICD-10-CM | POA: Diagnosis present

## 2019-02-25 DIAGNOSIS — G936 Cerebral edema: Secondary | ICD-10-CM | POA: Diagnosis present

## 2019-02-25 DIAGNOSIS — R270 Ataxia, unspecified: Secondary | ICD-10-CM | POA: Diagnosis present

## 2019-02-25 DIAGNOSIS — E785 Hyperlipidemia, unspecified: Secondary | ICD-10-CM | POA: Diagnosis present

## 2019-02-25 DIAGNOSIS — G44229 Chronic tension-type headache, not intractable: Secondary | ICD-10-CM | POA: Diagnosis present

## 2019-02-25 DIAGNOSIS — I639 Cerebral infarction, unspecified: Secondary | ICD-10-CM | POA: Diagnosis present

## 2019-02-25 DIAGNOSIS — M1990 Unspecified osteoarthritis, unspecified site: Secondary | ICD-10-CM | POA: Diagnosis present

## 2019-02-25 DIAGNOSIS — Z9114 Patient's other noncompliance with medication regimen: Secondary | ICD-10-CM

## 2019-02-25 DIAGNOSIS — R402142 Coma scale, eyes open, spontaneous, at arrival to emergency department: Secondary | ICD-10-CM | POA: Diagnosis present

## 2019-02-25 DIAGNOSIS — C61 Malignant neoplasm of prostate: Secondary | ICD-10-CM | POA: Diagnosis present

## 2019-02-25 DIAGNOSIS — R402362 Coma scale, best motor response, obeys commands, at arrival to emergency department: Secondary | ICD-10-CM | POA: Diagnosis present

## 2019-02-25 DIAGNOSIS — I10 Essential (primary) hypertension: Secondary | ICD-10-CM | POA: Diagnosis present

## 2019-02-25 DIAGNOSIS — Z91128 Patient's intentional underdosing of medication regimen for other reason: Secondary | ICD-10-CM

## 2019-02-25 DIAGNOSIS — R29701 NIHSS score 1: Secondary | ICD-10-CM | POA: Diagnosis present

## 2019-02-25 DIAGNOSIS — F1010 Alcohol abuse, uncomplicated: Secondary | ICD-10-CM | POA: Diagnosis present

## 2019-02-25 DIAGNOSIS — F1910 Other psychoactive substance abuse, uncomplicated: Secondary | ICD-10-CM | POA: Diagnosis present

## 2019-02-25 DIAGNOSIS — Z8673 Personal history of transient ischemic attack (TIA), and cerebral infarction without residual deficits: Secondary | ICD-10-CM

## 2019-02-25 LAB — CBC
HCT: 44.8 % (ref 39.0–52.0)
Hemoglobin: 15.4 g/dL (ref 13.0–17.0)
MCH: 33.3 pg (ref 26.0–34.0)
MCHC: 34.4 g/dL (ref 30.0–36.0)
MCV: 96.8 fL (ref 80.0–100.0)
Platelets: 269 10*3/uL (ref 150–400)
RBC: 4.63 MIL/uL (ref 4.22–5.81)
RDW: 14.7 % (ref 11.5–15.5)
WBC: 6.3 10*3/uL (ref 4.0–10.5)
nRBC: 0 % (ref 0.0–0.2)

## 2019-02-25 LAB — I-STAT CHEM 8, ED
BUN: 7 mg/dL (ref 6–20)
Calcium, Ion: 1.15 mmol/L (ref 1.15–1.40)
Chloride: 107 mmol/L (ref 98–111)
Creatinine, Ser: 1.3 mg/dL — ABNORMAL HIGH (ref 0.61–1.24)
Glucose, Bld: 88 mg/dL (ref 70–99)
HCT: 47 % (ref 39.0–52.0)
Hemoglobin: 16 g/dL (ref 13.0–17.0)
Potassium: 3.8 mmol/L (ref 3.5–5.1)
Sodium: 141 mmol/L (ref 135–145)
TCO2: 22 mmol/L (ref 22–32)

## 2019-02-25 LAB — DIFFERENTIAL
Abs Immature Granulocytes: 0.02 10*3/uL (ref 0.00–0.07)
Basophils Absolute: 0 10*3/uL (ref 0.0–0.1)
Basophils Relative: 1 %
Eosinophils Absolute: 0 10*3/uL (ref 0.0–0.5)
Eosinophils Relative: 0 %
Immature Granulocytes: 0 %
Lymphocytes Relative: 27 %
Lymphs Abs: 1.7 10*3/uL (ref 0.7–4.0)
Monocytes Absolute: 0.5 10*3/uL (ref 0.1–1.0)
Monocytes Relative: 8 %
Neutro Abs: 4 10*3/uL (ref 1.7–7.7)
Neutrophils Relative %: 64 %

## 2019-02-25 LAB — COMPREHENSIVE METABOLIC PANEL
ALT: 28 U/L (ref 0–44)
AST: 34 U/L (ref 15–41)
Albumin: 4.3 g/dL (ref 3.5–5.0)
Alkaline Phosphatase: 88 U/L (ref 38–126)
Anion gap: 12 (ref 5–15)
BUN: 7 mg/dL (ref 6–20)
CO2: 21 mmol/L — ABNORMAL LOW (ref 22–32)
Calcium: 9.7 mg/dL (ref 8.9–10.3)
Chloride: 105 mmol/L (ref 98–111)
Creatinine, Ser: 1.34 mg/dL — ABNORMAL HIGH (ref 0.61–1.24)
GFR calc Af Amer: >60 mL/min (ref >60)
GFR calc non Af Amer: 58 mL/min — ABNORMAL LOW (ref >60)
Glucose, Bld: 94 mg/dL (ref 70–99)
Potassium: 3.8 mmol/L (ref 3.5–5.1)
Sodium: 138 mmol/L (ref 135–145)
Total Bilirubin: 0.6 mg/dL (ref 0.3–1.2)
Total Protein: 7 g/dL (ref 6.5–8.1)

## 2019-02-25 LAB — PROTIME-INR
INR: 1 (ref 0.8–1.2)
Prothrombin Time: 13.2 seconds (ref 11.4–15.2)

## 2019-02-25 LAB — APTT: aPTT: 25 seconds (ref 24–36)

## 2019-02-25 LAB — CBG MONITORING, ED: Glucose-Capillary: 117 mg/dL — ABNORMAL HIGH (ref 70–99)

## 2019-02-25 MED ORDER — SODIUM CHLORIDE 0.9% FLUSH: 3.0000 mL | Freq: Once | INTRAVENOUS | Status: DC

## 2019-02-25 NOTE — ED Triage Notes (Signed)
Pt c/o headache x 3 weeks and left hand numbness and weakness that started this morning. No facial droop or drift noted, A%O x 4, ambulatory without difficulty.

## 2019-02-26 ENCOUNTER — Encounter (HOSPITAL_COMMUNITY): Payer: Self-pay | Admitting: Internal Medicine

## 2019-02-26 ENCOUNTER — Inpatient Hospital Stay (HOSPITAL_COMMUNITY): Payer: Medicaid Other

## 2019-02-26 ENCOUNTER — Emergency Department (HOSPITAL_COMMUNITY): Payer: Medicaid Other

## 2019-02-26 DIAGNOSIS — G44229 Chronic tension-type headache, not intractable: Secondary | ICD-10-CM | POA: Diagnosis present

## 2019-02-26 DIAGNOSIS — G8324 Monoplegia of upper limb affecting left nondominant side: Secondary | ICD-10-CM | POA: Diagnosis present

## 2019-02-26 DIAGNOSIS — M1990 Unspecified osteoarthritis, unspecified site: Secondary | ICD-10-CM | POA: Diagnosis present

## 2019-02-26 DIAGNOSIS — F1910 Other psychoactive substance abuse, uncomplicated: Secondary | ICD-10-CM | POA: Diagnosis present

## 2019-02-26 DIAGNOSIS — I6389 Other cerebral infarction: Secondary | ICD-10-CM

## 2019-02-26 DIAGNOSIS — G936 Cerebral edema: Secondary | ICD-10-CM | POA: Diagnosis present

## 2019-02-26 DIAGNOSIS — Z20828 Contact with and (suspected) exposure to other viral communicable diseases: Secondary | ICD-10-CM | POA: Diagnosis present

## 2019-02-26 DIAGNOSIS — F17210 Nicotine dependence, cigarettes, uncomplicated: Secondary | ICD-10-CM | POA: Diagnosis present

## 2019-02-26 DIAGNOSIS — E785 Hyperlipidemia, unspecified: Secondary | ICD-10-CM | POA: Diagnosis present

## 2019-02-26 DIAGNOSIS — Y92009 Unspecified place in unspecified non-institutional (private) residence as the place of occurrence of the external cause: Secondary | ICD-10-CM | POA: Diagnosis not present

## 2019-02-26 DIAGNOSIS — R29701 NIHSS score 1: Secondary | ICD-10-CM | POA: Diagnosis present

## 2019-02-26 DIAGNOSIS — Z7982 Long term (current) use of aspirin: Secondary | ICD-10-CM | POA: Diagnosis not present

## 2019-02-26 DIAGNOSIS — Z880 Allergy status to penicillin: Secondary | ICD-10-CM | POA: Diagnosis not present

## 2019-02-26 DIAGNOSIS — R402252 Coma scale, best verbal response, oriented, at arrival to emergency department: Secondary | ICD-10-CM | POA: Diagnosis present

## 2019-02-26 DIAGNOSIS — Z9114 Patient's other noncompliance with medication regimen: Secondary | ICD-10-CM | POA: Diagnosis not present

## 2019-02-26 DIAGNOSIS — T39396A Underdosing of other nonsteroidal anti-inflammatory drugs [NSAID], initial encounter: Secondary | ICD-10-CM | POA: Diagnosis present

## 2019-02-26 DIAGNOSIS — R402362 Coma scale, best motor response, obeys commands, at arrival to emergency department: Secondary | ICD-10-CM | POA: Diagnosis present

## 2019-02-26 DIAGNOSIS — Z91128 Patient's intentional underdosing of medication regimen for other reason: Secondary | ICD-10-CM | POA: Diagnosis not present

## 2019-02-26 DIAGNOSIS — I639 Cerebral infarction, unspecified: Secondary | ICD-10-CM | POA: Diagnosis present

## 2019-02-26 DIAGNOSIS — Z8673 Personal history of transient ischemic attack (TIA), and cerebral infarction without residual deficits: Secondary | ICD-10-CM | POA: Diagnosis not present

## 2019-02-26 DIAGNOSIS — Z79899 Other long term (current) drug therapy: Secondary | ICD-10-CM | POA: Diagnosis not present

## 2019-02-26 DIAGNOSIS — I63231 Cerebral infarction due to unspecified occlusion or stenosis of right carotid arteries: Secondary | ICD-10-CM | POA: Diagnosis not present

## 2019-02-26 DIAGNOSIS — F1010 Alcohol abuse, uncomplicated: Secondary | ICD-10-CM | POA: Diagnosis present

## 2019-02-26 DIAGNOSIS — F1410 Cocaine abuse, uncomplicated: Secondary | ICD-10-CM | POA: Diagnosis present

## 2019-02-26 DIAGNOSIS — R402142 Coma scale, eyes open, spontaneous, at arrival to emergency department: Secondary | ICD-10-CM | POA: Diagnosis present

## 2019-02-26 DIAGNOSIS — R270 Ataxia, unspecified: Secondary | ICD-10-CM | POA: Diagnosis present

## 2019-02-26 DIAGNOSIS — Z8546 Personal history of malignant neoplasm of prostate: Secondary | ICD-10-CM | POA: Diagnosis not present

## 2019-02-26 DIAGNOSIS — I10 Essential (primary) hypertension: Secondary | ICD-10-CM

## 2019-02-26 LAB — CBC
HCT: 42.9 % (ref 39.0–52.0)
Hemoglobin: 14.9 g/dL (ref 13.0–17.0)
MCH: 33.7 pg (ref 26.0–34.0)
MCHC: 34.7 g/dL (ref 30.0–36.0)
MCV: 97.1 fL (ref 80.0–100.0)
Platelets: 225 10*3/uL (ref 150–400)
RBC: 4.42 MIL/uL (ref 4.22–5.81)
RDW: 14.7 % (ref 11.5–15.5)
WBC: 4.7 10*3/uL (ref 4.0–10.5)
nRBC: 0 % (ref 0.0–0.2)

## 2019-02-26 LAB — LIPID PANEL
Cholesterol: 182 mg/dL (ref 0–200)
HDL: 32 mg/dL — ABNORMAL LOW (ref >40)
LDL Cholesterol: 128 mg/dL — ABNORMAL HIGH (ref 0–99)
Total CHOL/HDL Ratio: 5.7 RATIO
Triglycerides: 112 mg/dL (ref <150)
VLDL: 22 mg/dL (ref 0–40)

## 2019-02-26 LAB — COMPREHENSIVE METABOLIC PANEL
ALT: 27 U/L (ref 0–44)
AST: 26 U/L (ref 15–41)
Albumin: 3.7 g/dL (ref 3.5–5.0)
Alkaline Phosphatase: 73 U/L (ref 38–126)
Anion gap: 11 (ref 5–15)
BUN: 9 mg/dL (ref 6–20)
CO2: 24 mmol/L (ref 22–32)
Calcium: 8.9 mg/dL (ref 8.9–10.3)
Chloride: 105 mmol/L (ref 98–111)
Creatinine, Ser: 1.22 mg/dL (ref 0.61–1.24)
GFR calc Af Amer: >60 mL/min (ref >60)
GFR calc non Af Amer: >60 mL/min (ref >60)
Glucose, Bld: 146 mg/dL — ABNORMAL HIGH (ref 70–99)
Potassium: 3.7 mmol/L (ref 3.5–5.1)
Sodium: 140 mmol/L (ref 135–145)
Total Bilirubin: 0.8 mg/dL (ref 0.3–1.2)
Total Protein: 6.1 g/dL — ABNORMAL LOW (ref 6.5–8.1)

## 2019-02-26 LAB — HEMOGLOBIN A1C
Hgb A1c MFr Bld: 5.8 % — ABNORMAL HIGH (ref 4.8–5.6)
Mean Plasma Glucose: 119.76 mg/dL

## 2019-02-26 LAB — PHOSPHORUS: Phosphorus: 4.4 mg/dL (ref 2.5–4.6)

## 2019-02-26 LAB — URINALYSIS, ROUTINE W REFLEX MICROSCOPIC
Bacteria, UA: NONE SEEN
Bilirubin Urine: NEGATIVE
Glucose, UA: NEGATIVE mg/dL
Hgb urine dipstick: NEGATIVE
Ketones, ur: NEGATIVE mg/dL
Nitrite: NEGATIVE
Protein, ur: NEGATIVE mg/dL
Specific Gravity, Urine: 1.021 (ref 1.005–1.030)
pH: 5 (ref 5.0–8.0)

## 2019-02-26 LAB — ECHOCARDIOGRAM COMPLETE

## 2019-02-26 LAB — RAPID URINE DRUG SCREEN, HOSP PERFORMED
Amphetamines: NOT DETECTED
Barbiturates: NOT DETECTED
Benzodiazepines: NOT DETECTED
Cocaine: POSITIVE — AB
Opiates: NOT DETECTED
Tetrahydrocannabinol: POSITIVE — AB

## 2019-02-26 LAB — TSH: TSH: 3.671 u[IU]/mL (ref 0.350–4.500)

## 2019-02-26 LAB — MAGNESIUM: Magnesium: 2.1 mg/dL (ref 1.7–2.4)

## 2019-02-26 LAB — SARS CORONAVIRUS 2 (TAT 6-24 HRS): SARS Coronavirus 2: NEGATIVE

## 2019-02-26 LAB — HIV ANTIBODY (ROUTINE TESTING W REFLEX): HIV Screen 4th Generation wRfx: NONREACTIVE

## 2019-02-26 LAB — ETHANOL: Alcohol, Ethyl (B): <10 mg/dL (ref <10)

## 2019-02-26 MED ORDER — ACETAMINOPHEN 160 MG/5ML PO SOLN: 650.0000 mg | ORAL | Status: DC | PRN

## 2019-02-26 MED ORDER — IOHEXOL 350 MG/ML SOLN
75.0000 mL | Freq: Once | INTRAVENOUS | Status: AC | PRN
  Administered 2019-02-26: 75 mL via INTRAVENOUS

## 2019-02-26 MED ORDER — FOLIC ACID 1 MG PO TABS
1.0000 mg | ORAL_TABLET | Freq: Every day | ORAL | Status: DC
  Administered 2019-02-26: 1 mg via ORAL
  Filled 2019-02-26: qty 1

## 2019-02-26 MED ORDER — ASPIRIN EC 325 MG PO TBEC
325.0000 mg | DELAYED_RELEASE_TABLET | Freq: Every day | ORAL | Status: DC
  Administered 2019-02-26: 325 mg via ORAL
  Filled 2019-02-26: qty 1

## 2019-02-26 MED ORDER — ACETAMINOPHEN 650 MG RE SUPP: 650.0000 mg | RECTAL | Status: DC | PRN

## 2019-02-26 MED ORDER — LORAZEPAM 1 MG PO TABS: 1.0000 mg | ORAL_TABLET | ORAL | Status: DC | PRN

## 2019-02-26 MED ORDER — VITAMIN B-1 100 MG PO TABS
100.0000 mg | ORAL_TABLET | Freq: Every day | ORAL | Status: DC
  Administered 2019-02-26: 100 mg via ORAL
  Filled 2019-02-26: qty 1

## 2019-02-26 MED ORDER — LISINOPRIL 40 MG PO TABS: 40.0000 mg | ORAL_TABLET | Freq: Every morning | ORAL | Status: DC

## 2019-02-26 MED ORDER — ACETAMINOPHEN 325 MG PO TABS: 650.0000 mg | ORAL_TABLET | ORAL | Status: DC | PRN

## 2019-02-26 MED ORDER — ALBUTEROL SULFATE (2.5 MG/3ML) 0.083% IN NEBU: 2.5000 mg | INHALATION_SOLUTION | Freq: Four times a day (QID) | RESPIRATORY_TRACT | Status: DC | PRN

## 2019-02-26 MED ORDER — ENOXAPARIN SODIUM 40 MG/0.4ML ~~LOC~~ SOLN
40.0000 mg | Freq: Every day | SUBCUTANEOUS | Status: DC
  Filled 2019-02-26: qty 0.4

## 2019-02-26 MED ORDER — SODIUM CHLORIDE 0.9 % IV SOLN
INTRAVENOUS | Status: DC
  Administered 2019-02-26: 04:00:00 via INTRAVENOUS

## 2019-02-26 MED ORDER — ATORVASTATIN CALCIUM 80 MG PO TABS
80.0000 mg | ORAL_TABLET | Freq: Every day | ORAL | Status: DC
  Administered 2019-02-26: 80 mg via ORAL
  Filled 2019-02-26: qty 1

## 2019-02-26 MED ORDER — GADOBUTROL 1 MMOL/ML IV SOLN
9.5000 mL | Freq: Once | INTRAVENOUS | Status: AC | PRN
  Administered 2019-02-26: 9.5 mL via INTRAVENOUS

## 2019-02-26 MED ORDER — STROKE: EARLY STAGES OF RECOVERY BOOK
Freq: Once | Status: DC
  Filled 2019-02-26: qty 1

## 2019-02-26 MED ORDER — ADULT MULTIVITAMIN W/MINERALS CH
1.0000 | ORAL_TABLET | Freq: Every day | ORAL | Status: DC
  Administered 2019-02-26: 1 via ORAL
  Filled 2019-02-26: qty 1

## 2019-02-26 MED ORDER — ALBUTEROL SULFATE (2.5 MG/3ML) 0.083% IN NEBU: 2.5000 mg | INHALATION_SOLUTION | Freq: Four times a day (QID) | RESPIRATORY_TRACT | Status: DC

## 2019-02-26 MED ORDER — CLOPIDOGREL BISULFATE 75 MG PO TABS
75.0000 mg | ORAL_TABLET | Freq: Every day | ORAL | Status: DC
  Administered 2019-02-26: 75 mg via ORAL
  Filled 2019-02-26: qty 1

## 2019-02-26 MED ORDER — ATORVASTATIN CALCIUM 80 MG PO TABS: 80.0000 mg | ORAL_TABLET | Freq: Every day | ORAL | 0 refills | Status: DC

## 2019-02-26 MED ORDER — ASPIRIN 325 MG PO TABS: 325.0000 mg | ORAL_TABLET | Freq: Every day | ORAL | Status: DC

## 2019-02-26 MED ORDER — CLOPIDOGREL BISULFATE 75 MG PO TABS: 75.0000 mg | ORAL_TABLET | Freq: Every day | ORAL | 2 refills | Status: DC

## 2019-02-26 MED ORDER — ASPIRIN 325 MG PO TBEC: 325.0000 mg | DELAYED_RELEASE_TABLET | Freq: Every day | ORAL | 0 refills | Status: DC

## 2019-02-26 MED ORDER — LORAZEPAM 2 MG/ML IJ SOLN: 1.0000 mg | INTRAMUSCULAR | Status: DC | PRN

## 2019-02-26 MED ORDER — LISINOPRIL 20 MG PO TABS: 40.0000 mg | ORAL_TABLET | Freq: Every morning | ORAL | Status: DC

## 2019-02-26 MED ORDER — ASPIRIN 300 MG RE SUPP: 300.0000 mg | Freq: Every day | RECTAL | Status: DC

## 2019-02-26 MED ORDER — HYDRALAZINE HCL 20 MG/ML IJ SOLN: 10.0000 mg | INTRAMUSCULAR | Status: DC | PRN

## 2019-02-26 MED ORDER — THIAMINE HCL 100 MG/ML IJ SOLN: 100.0000 mg | Freq: Every day | INTRAMUSCULAR | Status: DC

## 2019-02-26 NOTE — Evaluation (Signed)
Occupational Therapy Evaluation Patient Details Name: Frank Fischer MRN: [id number] DOB: Jul 01, 1960 Today's Date: 02/26/2019    History of Present Illness Pt is a 58 y/o male admitted secondary to LUE weakness. 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 PMH includes HTN, polysubstance abuse, tobacco abuse, and prostate cancer.    Clinical Impression   .Patient evaluated by Occupational Therapy with no further acute OT needs identified. All education has been completed and the patient has no further questions. Pt demonstrates Lt visual field deficit.  Education provided re: safety, need for [employer], no driving, and need for follow up OT.  Recommend he needs someone walking with him in his left when outside and in the community  See below for any follow-up Occupational Therapy or equipment needs. OT is signing off. Thank you for this referral.      Follow Up Recommendations  Outpatient OT;Supervision - Intermittent    Equipment Recommendations  None recommended by OT    Recommendations for Other Services       Precautions / Restrictions Precautions Precautions: Fall Restrictions Weight Bearing Restrictions: No      Mobility Bed Mobility Overal bed mobility: Modified Independent Bed Mobility: Supine to Sit;Sit to Supine     Supine to sit: Supervision Sit to supine: Supervision   General bed mobility comments: Supervision for safety.   Transfers Overall transfer level: Needs assistance Equipment used: None Transfers: Sit to/from [facility] Sit to Stand: Supervision Stand pivot transfers: Supervision       General transfer comment: Supervision for safety.    Balance Overall balance assessment: Needs assistance Sitting-balance support: No upper extremity supported;Feet supported Sitting balance-Leahy Scale: Good     Standing balance support: No upper extremity  supported Standing balance-Leahy Scale: Fair                             ADL either performed or assessed with clinical judgement   ADL                                               Vision Baseline Vision/History: Wears glasses Wears Glasses: Reading only Patient Visual Report: Peripheral vision impairment Vision Assessment?: Yes Eye Alignment: Within Functional Limits Ocular Range of Motion: Within Functional Limits Tracking/Visual Pursuits: Able to track stimulus in all quads without difficulty Visual Fields: Left visual field deficit Additional Comments: Pt reports he misses items on his Lt      Perception Perception Perception Tested?: Yes   Praxis      Pertinent Vitals/Pain Pain Assessment: No/denies pain     Hand Dominance Right   Extremity/Trunk Assessment Upper Extremity Assessment Upper Extremity Assessment: LUE deficits/detail LUE Deficits / Details: strength grossly 4/5    Lower Extremity Assessment Lower Extremity Assessment: Overall WFL for tasks assessed   Cervical / Trunk Assessment Cervical / Trunk Assessment: Normal   Communication Communication Communication: No difficulties   Cognition Arousal/Alertness: Awake/alert Behavior During Therapy: WFL for tasks assessed/performed Overall Cognitive Status: Within Functional Limits for tasks assessed                                 General Comments: for basic tasks    General Comments  reviewed safety safety with visual field defitis - hand out provided.  Recommend no driving, OP OT, and follow up with opththalmologist for [facility] field test     Exercises     Shoulder Instructions      Home Living Family/patient expects to be discharged to:: Private residence Living Arrangements: Spouse/significant other Available Help at Discharge: Family;Available 24 hours/day Type of Home: Apartment Home Access: Stairs to enter [employer] of  Steps: flight Entrance Stairs-Rails: Right;Left;Can reach both Home Layout: One level     Bathroom Shower/Tub: Teacher, early years/pre: Standard     Home Equipment: None          Prior Functioning/Environment Level of Independence: Independent                 OT Problem List: Decreased strength;Impaired balance (sitting and/or standing);Impaired UE functional use;Impaired vision/perception      OT Treatment/Interventions:      OT Goals(Current goals can be found in the care plan section) Acute Rehab OT Goals Patient Stated Goal: to go home OT Goal Formulation: All assessment and education complete, DC therapy  OT Frequency:     Barriers to D/C:            Co-evaluation              AM-PAC OT "6 Clicks" Daily Activity     Outcome Measure Help from another person eating meals?: None Help from another person taking care of personal grooming?: None Help from another person toileting, which includes using toliet, bedpan, or urinal?: None Help from another person bathing (including washing, rinsing, drying)?: None Help from another person to put on and taking off regular upper body clothing?: None   6 Click Score: 20   End of Session Nurse Communication: Mobility status  Activity Tolerance: Patient tolerated treatment well Patient left: in bed;with call bell/phone within reach;with family/visitor present  OT Visit Diagnosis: Low vision, both eyes (H54.2)                Time: [id number] OT Time Calculation (min): 22 min Charges:  OT General Charges $OT Visit: 1 Visit OT Evaluation $OT Eval Moderate Complexity: 1 Mod  Lucille Passy, OTR/L Acute Rehabilitation Services Pager [phone] Office [phone]   Lucille Passy M 02/26/2019, 5:29 PM

## 2019-02-26 NOTE — Discharge Instructions (Signed)
NO DRIVING

## 2019-02-26 NOTE — ED Provider Notes (Signed)
[facility] EMERGENCY DEPARTMENT Provider Note   CSN: [id number] Arrival date & time: 02/25/19  1601     History   Chief Complaint Chief Complaint  Patient presents with   Headache   Weakness    HPI Frank Fischer is a 58 y.o. male with a hx of arthritis, hypertension, prostate cancer s/p radioactive seed implant, reported history of CVA with MRI findings of small chronic lacunar infarctions in 2018 presents to the Emergency Department complaining of gradual, persistent, waxing and waning headache onset approximately 3 weeks ago.  Patient reports some intermittent vision changes with this as well.  He states there are times he simply has difficulty seeing things but does not describe specific field cuts.  He reports taking several doses of aspirin over the last several weeks which did help his headache some.  He reports he is supposed to be taking aspirin daily but does not always take it.  He is not anticoagulated.  He does report compliance with his hypertension medications.  Patient reports that today he mowed the grass without difficulty and then around 3 PM and noticed that he was having difficulty utilizing his left arm.  He reports he was unable to grasp things with his left arm, but denies associated new vision changes, speech difficulty, right arm weakness, gait disturbance.  Patient reports he subsequently came to the hospital.  No specific aggravating or alleviating factors.  Patient reports he continues to have a mild headache.       The history is provided by the patient and medical records. No language interpreter was used.    Past Medical History:  Diagnosis Date   Arthritis    Cervical stenosis of spine    Chronic headaches    Dental caries    periodontal disease   Frequency of urination    History of cerebral infarction 11-28-2017  per pt never had symptoms   per MRI imaging 10-31-2016 2 small chronic lacunar infartions in the right frontal  periventricular white matter , and chronic left medial orbital fracture (as seen 2014 MRI)   Hypertension    Noncompliance with medication regimen    Prostate cancer [facility]) urologist-  dr winter/  oncologist-  dr Tammi Klippel   dx 07-21-2017--- Stage T1c, Gleason 4+3,  PSA 13.40,  vol 49.7cc--  scheduled for radioactive seed implants 12-03-2017    Patient Active Problem List   Diagnosis Date Noted   Malignant neoplasm of prostate ([place]) 10/02/2017   Noncompliance with medication regimen 12/18/2016   Chronic tension type headache 10/20/2016   Headache(784.0) 10/19/2013   Itching 10/19/2013   Smoker 10/19/2013   Essential hypertension, benign 07/08/2013   Pain in joint, shoulder region 07/08/2013   Cervical disc disorder with radiculopathy of cervical region 07/08/2013   Dental caries 07/08/2013   Spinal stenosis in cervical region 07/07/2013   Tendinitis of left rotator cuff 07/07/2013    Past Surgical History:  Procedure Laterality Date   ABDOMINAL EXPLORATION SURGERY  age 37   per pt had Small Bowel Resection and Appendectomy   APPENDECTOMY     ARM SURGERY Left 1999   REPAIR INJURY-- "ARM CUT ABOUT OFF"   PROSTATE BIOPSY  07/21/2017  dr winter office   RADIOACTIVE SEED IMPLANT N/A 12/03/2017   Procedure: RADIOACTIVE SEED IMPLANT/BRACHYTHERAPY IMPLANT;  Surgeon: Ceasar Mons, MD;  Location: [facility];  Service: Urology;  Laterality: N/A;  ONLY NEEDS 120 MIN   SPACE OAR INSTILLATION N/A 12/03/2017  Procedure: SPACE OAR INSTILLATION;  Surgeon: Ceasar Mons, MD;  Location: [facility];  Service: Urology;  Laterality: N/A;   TOOTH EXTRACTION N/A 09/21/2018   Procedure: DENTAL EXTRACTIONS WITH ALVEOLIPLASTY;  Surgeon: Diona Browner, DDS;  Location: [facility];  Service: Oral Surgery;  Laterality: N/A;        Home Medications    Prior to Admission medications   Medication Sig Start Date End Date Taking?  Authorizing Provider  acetaminophen (TYLENOL) 500 MG tablet Take 1 tablet (500 mg total) by mouth every 6 (six) hours as needed. Patient not taking: Reported on 10/29/2018 10/28/18   Loura Halt A, NP  albuterol (VENTOLIN HFA) 108 (90 Base) MCG/ACT inhaler Inhale 2 puffs into the lungs 4 (four) times daily. 10/22/18   [provider]  lisinopril (PRINIVIL,ZESTRIL) 40 MG tablet Take 1 tablet (40 mg total) by mouth daily. Patient taking differently: Take 40 mg by mouth every morning.  10/19/13   Lorayne Marek, MD  methocarbamol (ROBAXIN) 500 MG tablet Take 1 tablet (500 mg total) by mouth 2 (two) times daily. Patient not taking: Reported on 12/02/2018 10/28/18   Loura Halt A, NP  predniSONE (STERAPRED UNI-PAK 21 TAB) 10 MG (21) TBPK tablet Take by mouth daily. Take as directed. Patient not taking: Reported on 12/02/2018 11/11/18   Vanessa Kick, MD  traMADol (ULTRAM) 50 MG tablet Take 1 tablet (50 mg total) by mouth every 6 (six) hours as needed for severe pain. Patient not taking: Reported on 12/02/2018 10/29/18   Dalia Heading, PA-C    Family History Family History  Problem Relation Age of Onset   Cancer Mother    Diabetes Paternal Grandfather     Social History Social History   Tobacco Use   Smoking status: Current Every Day Smoker    Packs/day: 0.50    Years: 40.00    Pack years: 20.00    Types: Cigarettes   Smokeless tobacco: Never Used  Substance Use Topics   Alcohol use: Yes    Alcohol/week: 21.0 standard drinks    Types: 21 Cans of beer per week    Comment: 1-2 12oz  beers every evening   Drug use: Yes    Types: Marijuana    Comment:  per pt uses once week     Allergies   Penicillins   Review of Systems Review of Systems  Constitutional: Negative for appetite change, diaphoresis, fatigue, fever and unexpected weight change.  HENT: Negative for mouth sores.   Eyes: Positive for visual disturbance.  Respiratory: Negative for cough, chest tightness,  shortness of breath and wheezing.   Cardiovascular: Negative for chest pain.  Gastrointestinal: Negative for abdominal pain, constipation, diarrhea, nausea and vomiting.  Endocrine: Negative for polydipsia, polyphagia and polyuria.  Genitourinary: Negative for dysuria, frequency, hematuria and urgency.  Musculoskeletal: Negative for back pain and neck stiffness.  Skin: Negative for rash.  Allergic/Immunologic: Negative for immunocompromised state.  Neurological: Positive for weakness and headaches. Negative for syncope and light-headedness.  Hematological: Does not bruise/bleed easily.  Psychiatric/Behavioral: Negative for sleep disturbance. The patient is not nervous/anxious.      Physical Exam Updated Vital Signs BP (!) 144/87 (BP Location: Left Arm)    Pulse 84    Temp 98.4 F (36.9 C) (Oral)    Resp 17    SpO2 99%   Physical Exam Vitals signs and nursing note reviewed.  Constitutional:      General: He is not in acute distress.    Appearance: He  is well-developed. He is not diaphoretic.  HENT:     Head: Normocephalic and atraumatic.  Eyes:     General: No scleral icterus.    Extraocular Movements: Extraocular movements intact.     Conjunctiva/sclera: Conjunctivae normal.     Pupils: Pupils are equal, round, and reactive to light.     Comments: No horizontal, vertical or rotational nystagmus  Neck:     Musculoskeletal: Normal range of motion and neck supple.     Comments: Full active and passive ROM without pain No midline or paraspinal tenderness No nuchal rigidity or meningeal signs Cardiovascular:     Rate and Rhythm: Normal rate and regular rhythm.  Pulmonary:     Effort: Pulmonary effort is normal. No respiratory distress.     Breath sounds: Normal breath sounds. No wheezing or rales.  Abdominal:     General: Bowel sounds are normal.     Palpations: Abdomen is soft.     Tenderness: There is no abdominal tenderness. There is no guarding or rebound.    Musculoskeletal: Normal range of motion.  Lymphadenopathy:     Cervical: No cervical adenopathy.  Skin:    General: Skin is warm and dry.     Findings: No rash.  Neurological:     Mental Status: He is alert and oriented to person, place, and time.     Cranial Nerves: No cranial nerve deficit.     Motor: No abnormal muscle tone.     Coordination: Coordination normal.     Comments: Mental Status:  Alert, oriented, thought content appropriate. Speech fluent without evidence of aphasia. Able to follow 2 step commands without difficulty.  No pronator drift. Cranial Nerves:  II:  Peripheral visual fields grossly normal, pupils equal, round, reactive to light III,IV, VI: ptosis not present, extra-ocular motions intact bilaterally  V,VII: smile symmetric, facial light touch sensation equal VIII: hearing grossly normal bilaterally  IX,X: midline uvula rise  XI: bilateral shoulder shrug equal and strong XII: midline tongue extension  Motor:  5/5 in right upper and lower extremities including grip strength and dorsiflexion/plantar flexion 4/5 in left upper and lower extremities including grip strength and hip flexors.   Sensory: Light touch normal in all extremities.  Cerebellar: normal finger-to-nose with right upper extremity, abnormal finger-to-nose with past pointing of the left upper extremity Gait: gait testing deferred CV: distal pulses palpable throughout   Psychiatric:        Behavior: Behavior normal.        Thought Content: Thought content normal.        Judgment: Judgment normal.      ED Treatments / Results  Labs (all labs ordered are listed, but only abnormal results are displayed) Labs Reviewed  COMPREHENSIVE METABOLIC PANEL - Abnormal; Notable for the following components:      Result Value   CO2 21 (*)    Creatinine, Ser 1.34 (*)    GFR calc non Af Amer 58 (*)    All other components within normal limits  RAPID URINE DRUG SCREEN, HOSP PERFORMED - Abnormal;  Notable for the following components:   Cocaine POSITIVE (*)    Tetrahydrocannabinol POSITIVE (*)    All other components within normal limits  URINALYSIS, ROUTINE W REFLEX MICROSCOPIC - Abnormal; Notable for the following components:   Leukocytes,Ua MODERATE (*)    All other components within normal limits  I-STAT CHEM 8, ED - Abnormal; Notable for the following components:   Creatinine, Ser 1.30 (*)  All other components within normal limits  CBG MONITORING, ED - Abnormal; Notable for the following components:   Glucose-Capillary 117 (*)    All other components within normal limits  SARS CORONAVIRUS 2 (TAT 6-24 HRS)  PROTIME-INR  APTT  CBC  DIFFERENTIAL  ETHANOL    EKG EKG Interpretation  Date/Time:  Thursday February 25 2019 23:05:03 EDT Ventricular Rate:  51 PR Interval:    QRS Duration: 88 QT Interval:  443 QTC Calculation: 408 R Axis:   59 Text Interpretation:  Sinus rhythm Consider left atrial enlargement Consider left ventricular hypertrophy When compared with ECG of 11/06/2017, No significant change was found Confirmed by Delora Fuel ([id number]) on 02/26/2019 12:39:31 AM    Radiology Ct Head Wo Contrast  Result Date: 02/25/2019 CLINICAL DATA:  Headache for the past 3 weeks with left hand numbness and weakness that started this morning. EXAM: CT HEAD WITHOUT CONTRAST TECHNIQUE: Contiguous axial images were obtained from the base of the skull through the vertex without intravenous contrast. COMPARISON:  MR brain dated October 31, 2016. CT head dated July 30, 2016. FINDINGS: Brain: New areas of hypodensity in the right frontal, parietal, and occipital lobes with loss of the normal gray-white matter differentiation, consistent with acute infarcts. No evidence of hemorrhage, hydrocephalus, extra-axial collection or mass lesion/mass effect. Vascular: Atherosclerotic vascular calcification of the carotid siphons. No hyperdense vessel. Skull: Normal. Negative for fracture or focal  lesion. Sinuses/Orbits: No acute finding. Other: None. IMPRESSION: 1. Small acute infarcts involving the right frontal, parietal, and occipital lobes. Electronically Signed   By: Titus Dubin M.D.   On: 02/25/2019 18:42    Procedures Procedures (including critical care time)  Medications Ordered in ED Medications  sodium chloride flush (NS) 0.9 % injection 3 mL (3 mLs Intravenous Not Given 02/25/19 2324)     Initial Impression / Assessment and Plan / ED Course  I have reviewed the triage vital signs and the nursing notes.  Pertinent labs & imaging results that were available during my care of the patient were reviewed by me and considered in my medical decision making (see chart for details).  Clinical Course as of Feb 26 123  Fri Feb 26, 2019  0003 PCP: [facility] [address]   [HM]  [phone] Discussed with Dr. Cheral Marker who will evaluate.   [HM]  0022 baseline  Creatinine(!): 1.30 [HM]  0042 Discussed with Dr. Hal Hope who will admit   [HM]    Clinical Course User Index [HM] Knight Oelkers, Gwenlyn Perking        Patient presents with waxing and waning headaches, some ongoing vision changes and new left upper extremity weakness today at 3 PM.  Labs reassuring.  History of CKD at baseline today.  CT scan shows new, small acute infarcts involving the right frontal occipital and parietal lobes.  Patient will be evaluated by neurology and admitted for acute stroke.  MRI ordered.  Pt without hypertension here in the ED.    The patient was discussed with and seen by Dr. Roxanne Mins who agrees with the treatment plan.   Final Clinical Impressions(s) / ED Diagnoses   Final diagnoses:  Cerebrovascular accident (CVA), unspecified mechanism ([place])  Left arm weakness    ED Discharge Orders    None       Cayla Wiegand, Gwenlyn Perking [id number]    Delora Fuel, MD [id number] [phone]

## 2019-02-26 NOTE — Consult Note (Signed)
Referring Physician: Dr. Hal Hope    Chief Complaint: Left sided weakness  HPI: Frank Fischer is an 58 y.o. male presenting with a 3 week history of headache, followed by onset of left hand numbness with weakness on Thursday morning. CT head revealed small acute infarcts involving the right frontal, parietal, and occipital lobes. Of note, the patient is not taking an antiplatelet medication or blood thinner at home. Neurology has been consulted for stroke work up.   LSN: Thursday morning. Exact time of onset is unknown tPA Given: No: Completed strokes on CT head, presenting excessive risk for bleeding   Past Medical History:  Diagnosis Date  . Arthritis   . Cervical stenosis of spine   . Chronic headaches   . Dental caries    periodontal disease  . Frequency of urination   . History of cerebral infarction 11-28-2017  per pt never had symptoms   per MRI imaging 10-31-2016 2 small chronic lacunar infartions in the right frontal periventricular white matter , and chronic left medial orbital fracture (as seen 2014 MRI)  . Hypertension   . Noncompliance with medication regimen   . Prostate cancer [facility]) urologist-  dr winter/  oncologist-  dr Tammi Klippel   dx 07-21-2017--- Stage T1c, Gleason 4+3,  PSA 13.40,  vol 49.7cc--  scheduled for radioactive seed implants 12-03-2017    Past Surgical History:  Procedure Laterality Date  . ABDOMINAL EXPLORATION SURGERY  age 24   per pt had Small Bowel Resection and Appendectomy  . APPENDECTOMY    . ARM SURGERY Left 1999   REPAIR INJURY-- "ARM CUT ABOUT OFF"  . PROSTATE BIOPSY  07/21/2017  dr winter office  . RADIOACTIVE SEED IMPLANT N/A 12/03/2017   Procedure: RADIOACTIVE SEED IMPLANT/BRACHYTHERAPY IMPLANT;  Surgeon: Ceasar Mons, MD;  Location: [facility];  Service: Urology;  Laterality: N/A;  ONLY NEEDS 120 MIN  . SPACE OAR INSTILLATION N/A 12/03/2017   Procedure: SPACE OAR INSTILLATION;  Surgeon: Ceasar Mons, MD;  Location: [facility];  Service: Urology;  Laterality: N/A;  . TOOTH EXTRACTION N/A 09/21/2018   Procedure: DENTAL EXTRACTIONS WITH ALVEOLIPLASTY;  Surgeon: Diona Browner, DDS;  Location: [facility];  Service: Oral Surgery;  Laterality: N/A;    Family History  Problem Relation Age of Onset  . Cancer Mother   . Diabetes Paternal Grandfather    Social History:  reports that he has been smoking cigarettes. He has a 20.00 pack-year smoking history. He has never used smokeless tobacco. He reports current alcohol use of about 21.0 standard drinks of alcohol per week. He reports current drug use. Drug: Marijuana.  Allergies:  Allergies  Allergen Reactions  . Penicillins Itching    Did it involve swelling of the face/tongue/throat, SOB, or low BP? No Did it involve sudden or severe rash/hives, skin peeling, or any reaction on the inside of your mouth or nose? Yes Did you need to seek medical attention at a hospital or doctor's office? No When did it last happen?09/2018 If all above answers are "NO", may proceed with cephalosporin use.    Medications:  No current facility-administered medications on file prior to encounter.    Current Outpatient Medications on File Prior to Encounter  Medication Sig Dispense Refill  . acetaminophen (TYLENOL) 500 MG tablet Take 1 tablet (500 mg total) by mouth every 6 (six) hours as needed. (Patient not taking: Reported on 10/29/2018) 30 tablet 0  . albuterol (VENTOLIN HFA) 108 (90 Base) MCG/ACT inhaler  Inhale 2 puffs into the lungs 4 (four) times daily.    [place] Kitchen lisinopril (PRINIVIL,ZESTRIL) 40 MG tablet Take 1 tablet (40 mg total) by mouth daily. (Patient taking differently: Take 40 mg by mouth every morning. ) 90 tablet 3  . methocarbamol (ROBAXIN) 500 MG tablet Take 1 tablet (500 mg total) by mouth 2 (two) times daily. (Patient not taking: Reported on 12/02/2018) 20 tablet 0  . predniSONE (STERAPRED UNI-PAK 21 TAB) 10 MG (21) TBPK tablet  Take by mouth daily. Take as directed. (Patient not taking: Reported on 12/02/2018) 21 tablet 0  . traMADol (ULTRAM) 50 MG tablet Take 1 tablet (50 mg total) by mouth every 6 (six) hours as needed for severe pain. (Patient not taking: Reported on 12/02/2018) 20 tablet 0     ROS: As per HPI. Comprehensive ROS otherwise negative.   Physical Examination: Blood pressure (!) 144/87, pulse 84, temperature 98.4 F (36.9 C), temperature source Oral, resp. rate 17, SpO2 99 %.  HEENT: [place]/AT Lungs: Respirations unlabored. Ext: No edema  Neurologic Examination: Mental Status: Alert, fully oriented, thought content appropriate.  Speech fluent without evidence of aphasia.  Able to follow all commands without difficulty. Cranial Nerves: II:  Visual fields intact bilaterally. PERRL. III,IV, VI: No ptosis. EOMI with no nystagmus.   V,VII: No facial droop. Facial temp sensation equal bilaterally VIII: hearing intact to voice IX,X: Palate rises symmetrically XI: Slight lag on the left with shoulder shrug XII: midline tongue extension  Motor: RUE and RLE 5/5 LUE 4+/5 proximally and distally LLE 5/5 except for 4/5 ankle dorsiflexion [place] of LUE is noted when testing for pronator drift Sensory: Temp sensation decreased to LLE but normal to LUE and RUE/RLE. FT intact except for left hand and distal half of left forearm circumferentially.   Deep Tendon Reflexes:  1+ bilateral brachioradialis Low amplitude left patellar reflex with crossed adductor response (4+) Patient will not relax RLE for elicitation of reflex Cerebellar: No ataxia with right FNF. Past pointing is seen with left FNF (most consistent with optic ataxia).  Gait: Deferred  Results for orders placed or performed during the hospital encounter of 02/25/19 (from the past 48 hour(s))  Protime-INR     Status: None   Collection Time: 02/25/19  4:17 PM  Result Value Ref Range   Prothrombin Time 13.2 11.4 - 15.2 seconds   INR 1.0 0.8 -  1.2    Comment: (NOTE) INR goal varies based on device and disease states. Performed at [facility], [place] [address], [place], [place] [address]   APTT     Status: None   Collection Time: 02/25/19  4:17 PM  Result Value Ref Range   aPTT 25 24 - 36 seconds    Comment: Performed at [facility] [address] [place] [address]  CBC     Status: None   Collection Time: 02/25/19  4:17 PM  Result Value Ref Range   WBC 6.3 4.0 - 10.5 K/uL   RBC 4.63 4.22 - 5.81 MIL/uL   Hemoglobin 15.4 13.0 - 17.0 g/dL   HCT 44.8 39.0 - 52.0 %   MCV 96.8 80.0 - 100.0 fL   MCH 33.3 26.0 - 34.0 pg   MCHC 34.4 30.0 - 36.0 g/dL   RDW 14.7 11.5 - 15.5 %   Platelets 269 150 - 400 K/uL   nRBC 0.0 0.0 - 0.2 %    Comment: Performed at [facility], [place] [address],  [place], [place] [address]  Differential     Status: None   Collection Time: 02/25/19  4:17 PM  Result Value Ref Range   Neutrophils Relative % 64 %   Neutro Abs 4.0 1.7 - 7.7 K/uL   Lymphocytes Relative 27 %   Lymphs Abs 1.7 0.7 - 4.0 K/uL   Monocytes Relative 8 %   Monocytes Absolute 0.5 0.1 - 1.0 K/uL   Eosinophils Relative 0 %   Eosinophils Absolute 0.0 0.0 - 0.5 K/uL   Basophils Relative 1 %   Basophils Absolute 0.0 0.0 - 0.1 K/uL   Immature Granulocytes 0 %   Abs Immature Granulocytes 0.02 0.00 - 0.07 K/uL    Comment: Performed at [facility] [address], [place], [place] [address]  Comprehensive metabolic panel     Status: Abnormal   Collection Time: 02/25/19  4:17 PM  Result Value Ref Range   Sodium 138 135 - 145 mmol/L   Potassium 3.8 3.5 - 5.1 mmol/L   Chloride 105 98 - 111 mmol/L   CO2 21 (L) 22 - 32 mmol/L   Glucose, Bld 94 70 - 99 mg/dL   BUN 7 6 - 20 mg/dL   Creatinine, Ser 1.34 (H) 0.61 - 1.24 mg/dL   Calcium 9.7 8.9 - 10.3 mg/dL   Total Protein 7.0 6.5 - 8.1 g/dL   Albumin 4.3 3.5 - 5.0 g/dL   AST 34 15 - 41 U/L   ALT 28 0 - 44 U/L   Alkaline Phosphatase 88 38 - 126 U/L   Total  Bilirubin 0.6 0.3 - 1.2 mg/dL   GFR calc non Af Amer 58 (L) >60 mL/min   GFR calc Af Amer >60 >60 mL/min   Anion gap 12 5 - 15    Comment: Performed at [facility] [address] [place], [place] [address]  I-stat chem 8, ED     Status: Abnormal   Collection Time: 02/25/19  4:45 PM  Result Value Ref Range   Sodium 141 135 - 145 mmol/L   Potassium 3.8 3.5 - 5.1 mmol/L   Chloride 107 98 - 111 mmol/L   BUN 7 6 - 20 mg/dL   Creatinine, Ser 1.30 (H) 0.61 - 1.24 mg/dL   Glucose, Bld 88 70 - 99 mg/dL   Calcium, Ion 1.15 1.15 - 1.40 mmol/L   TCO2 22 22 - 32 mmol/L   Hemoglobin 16.0 13.0 - 17.0 g/dL   HCT 47.0 39.0 - 52.0 %  CBG monitoring, ED     Status: Abnormal   Collection Time: 02/25/19 11:56 PM  Result Value Ref Range   Glucose-Capillary 117 (H) 70 - 99 mg/dL   Ct Head Wo Contrast  Result Date: 02/25/2019 CLINICAL DATA:  Headache for the past 3 weeks with left hand numbness and weakness that started this morning. EXAM: CT HEAD WITHOUT CONTRAST TECHNIQUE: Contiguous axial images were obtained from the base of the skull through the vertex without intravenous contrast. COMPARISON:  MR brain dated October 31, 2016. CT head dated July 30, 2016. FINDINGS: Brain: New areas of hypodensity in the right frontal, parietal, and occipital lobes with loss of the normal gray-white matter differentiation, consistent with acute infarcts. No evidence of hemorrhage, hydrocephalus, extra-axial collection or mass lesion/mass effect. Vascular: Atherosclerotic vascular calcification of the carotid siphons. No hyperdense vessel. Skull: Normal. Negative for fracture or focal lesion. Sinuses/Orbits: No acute finding. Other: None. IMPRESSION: 1. Small acute infarcts involving the right frontal, parietal, and occipital  lobes. Electronically Signed   By: Titus Dubin M.D.   On: 02/25/2019 18:42    Assessment: 58 y.o. male presenting with a 3 week history of headache, followed by onset of left hand numbness  with weakness on Thursday morning.  1. CT head revealed small acute infarcts involving the right frontal, parietal, and occipital lobes.  2. Exam reveals mild left sided motor deficits in addition to sensory loss.  3. Of note, the patient is not taking an antiplatelet medication or blood thinner at home. 4. Stroke Risk Factors - HTN and history of stroke  Plan: 1. HgbA1c, fasting lipid panel 2. MRI, MRA of the brain without contrast 3. PT consult, OT consult, Speech consult 4. Echocardiogram 5. Carotid dopplers 6. Prophylactic therapy- Start ASA 81 mg po qd 7. Risk factor modification 8. Telemetry monitoring 9. Frequent neuro checks 10. BP management 11. Start atorvastatin 80 mg po qd. Obtain baseline CK level.    @Electronically  signed: Dr. Kerney Elbe  02/26/2019, 12:23 AM

## 2019-02-26 NOTE — TOC Transition Note (Signed)
Transition of Care [facility]) - CM/SW Discharge Note   Patient Details  Name: Frank Fischer MRN: [id number] Date of Birth: 09/06/1960  Transition of Care [facility]) CM/SW Contact:  Pollie Friar, RN Phone Number: 02/26/2019, 4:43 PM   Clinical Narrative:    Pt discharging home with outpatient therapy. Pt asking to attend [facility]. Orders in Epic and information on the AVS. Pt provided drug resources for outpatient and/or inpatient counseling.  Family to provide transportation home.     Final next level of care: OP Rehab Barriers to Discharge: No Barriers Identified   Patient Goals and CMS Choice   CMS Medicare.gov Compare Post Acute Care list provided to:: Patient Choice offered to / list presented to : Patient  Discharge Placement                       Discharge Plan and Services                                     Social Determinants of Health (SDOH) Interventions     Readmission Risk Interventions No flowsheet data found.

## 2019-02-26 NOTE — Progress Notes (Signed)
Patient admitted to OBS after midnight.   58 yo hx htn, polysubstance abuse, tobacco use, stroke admitted with left handed weakness. Mri brain reveals Multifocal acute ischemia within the right hemisphere, including along the precentral gyrus, postcentral gyrus, posterior right parietal lobe and right occipital lobe. Petechial hemorrhage at the right parieto-occipital infarct site. No mass effect. 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 Multifocal mild stenosis of the left cavernous and clinoid segments. Normal MRA of the neck. No proximal embolic source is identified. Admitted for stroke workup and evaluation by stroke team  A/P 1. Acute CVA - Await 2D echo results.  Patient on aspirin.  Allow for permissive hypertension. Lipid panel with HDL 32 and LDL 128, A1c 5.8,  physical therapy consult. 2. Polysubstance abuse including cocaine alcohol and tobacco -patient advised about quitting patient is on CIWA protocol. No s/sx withdrawal 3. Hypertension. Fair control. Home meds include lisinopril. PRN IV hydralazine for systolic blood pressure more than [id number] and diastolic more than 123456. 4. Chronic tension type of headaches. 5. Sinus bradycardia" intermittent. HR range 51-66.  check TSH     Santiago Glad m. Jamiesha Victoria, NP

## 2019-02-26 NOTE — ED Provider Notes (Signed)
58 year old male with history of hypertension and stroke comes in with headaches for the last week and left arm weakness.  Symptoms have been waxing and waning.  On exam, he has very mild weakness of the left arm, but significant ataxia with movement of the left arm.  CT shows several small acute infarcts in the right frontal, parietal, occipital lobes.  He will need to be admitted for stroke work-up with particular attention to find source of apparent embolic strokes.   Delora Fuel, MD [id number] [phone]

## 2019-02-26 NOTE — Progress Notes (Signed)
Patient being discharged home.  Patient to be transported by his wife.  IV removed with the catheter intact.  Discharge instructions and prescription information given to the patient who verbalized understanding. 

## 2019-02-26 NOTE — H&P (Signed)
History and Physical    Frank Fischer [id number] DOB: [birth year] DOA: 02/25/2019  PCP: Patient, No Pcp Per  Patient coming from: Home.  Chief Complaint: Left upper extremity weakness.  HPI: BURKE HAZZARD is a 58 y.o. male with history of hypertension, polysubstance abuse including cocaine alcohol and tobacco presents to the ER because of weakness of the left upper extremity.  Patient states over the last 3 weeks he has been having off-and-on headaches.  And around evening 3 PM he started having weakness of the left upper extremity.  Given the symptoms patient presented to the ER.  ED Course: In the ER patient on exam is 4 x 5 strength of the left upper extremity and 5 x 5 in the other extremities no facial asymmetry.  CT of the head shows right-sided small acute infarcts involving the right frontoparietal and occipital lobes.  This was further confirmed with MRI brain.  MRA brain shows high-grade stenosis of the right internal carotid.  MRA neck was normal.  EKG shows sinus bradycardia.  Urine drug screen is positive for cocaine and marijuana.  UA shows WBC 21-50 some moderate leukocyte Estrace but patient is asymptomatic for UTI.  Patient admitted for acute CVA.  Review of Systems: As per HPI, rest all negative.   Past Medical History:  Diagnosis Date   Arthritis    Cervical stenosis of spine    Chronic headaches    Dental caries    periodontal disease   Frequency of urination    History of cerebral infarction 11-28-2017  per pt never had symptoms   per MRI imaging 10-31-2016 2 small chronic lacunar infartions in the right frontal periventricular white matter , and chronic left medial orbital fracture (as seen 2014 MRI)   Hypertension    Noncompliance with medication regimen    Prostate cancer [facility]) urologist-  dr winter/  oncologist-  dr Tammi Klippel   dx 07-21-2017--- Stage T1c, Gleason 4+3,  PSA 13.40,  vol 49.7cc--  scheduled for radioactive seed implants 12-03-2017     Past Surgical History:  Procedure Laterality Date   ABDOMINAL EXPLORATION SURGERY  age 12   per pt had Small Bowel Resection and Appendectomy   APPENDECTOMY     ARM SURGERY Left 1999   REPAIR INJURY-- "ARM CUT ABOUT OFF"   PROSTATE BIOPSY  07/21/2017  dr winter office   RADIOACTIVE SEED IMPLANT N/A 12/03/2017   Procedure: RADIOACTIVE SEED IMPLANT/BRACHYTHERAPY IMPLANT;  Surgeon: Ceasar Mons, MD;  Location: [facility];  Service: Urology;  Laterality: N/A;  ONLY NEEDS 120 MIN   SPACE OAR INSTILLATION N/A 12/03/2017   Procedure: SPACE OAR INSTILLATION;  Surgeon: Ceasar Mons, MD;  Location: [facility];  Service: Urology;  Laterality: N/A;   TOOTH EXTRACTION N/A 09/21/2018   Procedure: DENTAL EXTRACTIONS WITH ALVEOLIPLASTY;  Surgeon: Diona Browner, DDS;  Location: [facility];  Service: Oral Surgery;  Laterality: N/A;     reports that he has been smoking cigarettes. He has a 20.00 pack-year smoking history. He has never used smokeless tobacco. He reports current alcohol use of about 21.0 standard drinks of alcohol per week. He reports current drug use. Drug: Marijuana.  Allergies  Allergen Reactions   Penicillins Itching    Did it involve swelling of the face/tongue/throat, SOB, or low BP? No Did it involve sudden or severe rash/hives, skin peeling, or any reaction on the inside of your mouth or nose? Yes Did you need to seek medical attention  at a hospital or doctor's office? No When did it last happen?09/2018 If all above answers are NO, may proceed with cephalosporin use.    Family History  Problem Relation Age of Onset   Cancer Mother    Diabetes Paternal Grandfather     Prior to Admission medications   Medication Sig Start Date End Date Taking? Authorizing Provider  acetaminophen (TYLENOL) 500 MG tablet Take 1 tablet (500 mg total) by mouth every 6 (six) hours as needed. Patient not taking: Reported on  10/29/2018 10/28/18   Loura Halt A, NP  albuterol (VENTOLIN HFA) 108 (90 Base) MCG/ACT inhaler Inhale 2 puffs into the lungs 4 (four) times daily. 10/22/18   [provider]  lisinopril (PRINIVIL,ZESTRIL) 40 MG tablet Take 1 tablet (40 mg total) by mouth daily. Patient taking differently: Take 40 mg by mouth every morning.  10/19/13   Lorayne Marek, MD  methocarbamol (ROBAXIN) 500 MG tablet Take 1 tablet (500 mg total) by mouth 2 (two) times daily. Patient not taking: Reported on 12/02/2018 10/28/18   Loura Halt A, NP  predniSONE (STERAPRED UNI-PAK 21 TAB) 10 MG (21) TBPK tablet Take by mouth daily. Take as directed. Patient not taking: Reported on 12/02/2018 11/11/18   Vanessa Kick, MD  traMADol (ULTRAM) 50 MG tablet Take 1 tablet (50 mg total) by mouth every 6 (six) hours as needed for severe pain. Patient not taking: Reported on 12/02/2018 10/29/18   Dalia Heading, PA-C    Physical Exam: Constitutional: Moderately built and nourished. Vitals:   02/25/19 1838 02/25/19 2315 02/26/19 0245 02/26/19 0300  BP: 127/74 (!) 144/87 (!) 141/60 130/90  Pulse: 64 84    Resp: 18 17 (!) 25 19  Temp: 98.4 F (36.9 C)     TempSrc: Oral     SpO2: 100% 99%     Eyes: Anicteric no pallor. ENMT: No discharge from the ears eyes nose or mouth. Neck: No mass felt.  No neck rigidity.  No JVD appreciated. Respiratory: No rhonchi or crepitations. Cardiovascular: S1-S2 heard. Abdomen: Soft nontender bowel sounds present. Musculoskeletal: No edema.  No joint effusion. Skin: No rash. Neurologic: Alert awake oriented to time place and person.  Left upper extremities 4 x 5 rest of the extremities are 5 x 5.  No facial asymmetry tongue is midline pupils are equal and reacting to light. Psychiatric: Appears normal per normal affect.   Labs on Admission: I have personally reviewed following labs and imaging studies  CBC: Recent Labs  Lab 02/25/19 1617 02/25/19 1645  WBC 6.3  --   NEUTROABS 4.0  --    HGB 15.4 16.0  HCT 44.8 47.0  MCV 96.8  --   PLT 269  --    Basic Metabolic Panel: Recent Labs  Lab 02/25/19 1617 02/25/19 1645  NA 138 141  K 3.8 3.8  CL 105 107  CO2 21*  --   GLUCOSE 94 88  BUN 7 7  CREATININE 1.34* 1.30*  CALCIUM 9.7  --    GFR: CrCl cannot be calculated (Unknown ideal weight.). Liver Function Tests: Recent Labs  Lab 02/25/19 1617  AST 34  ALT 28  ALKPHOS 88  BILITOT 0.6  PROT 7.0  ALBUMIN 4.3   No results for input(s): LIPASE, AMYLASE in the last 168 hours. No results for input(s): AMMONIA in the last 168 hours. Coagulation Profile: Recent Labs  Lab 02/25/19 1617  INR 1.0   Cardiac Enzymes: No results for input(s): CKTOTAL, CKMB, CKMBINDEX, TROPONINI in the  last 168 hours. BNP (last 3 results) No results for input(s): PROBNP in the last 8760 hours. HbA1C: No results for input(s): HGBA1C in the last 72 hours. CBG: Recent Labs  Lab 02/25/19 2356  GLUCAP 117*   Lipid Profile: No results for input(s): CHOL, HDL, LDLCALC, TRIG, CHOLHDL, LDLDIRECT in the last 72 hours. Thyroid Function Tests: No results for input(s): TSH, T4TOTAL, FREET4, T3FREE, THYROIDAB in the last 72 hours. Anemia Panel: No results for input(s): VITAMINB12, FOLATE, FERRITIN, TIBC, IRON, RETICCTPCT in the last 72 hours. Urine analysis:    Component Value Date/Time   COLORURINE YELLOW 02/26/2019 0028   APPEARANCEUR CLEAR 02/26/2019 0028   LABSPEC 1.021 02/26/2019 0028   PHURINE 5.0 02/26/2019 0028   GLUCOSEU NEGATIVE 02/26/2019 0028   HGBUR NEGATIVE 02/26/2019 0028   BILIRUBINUR NEGATIVE 02/26/2019 0028   KETONESUR NEGATIVE 02/26/2019 0028   PROTEINUR NEGATIVE 02/26/2019 0028   UROBILINOGEN 0.2 04/30/2013 1111   NITRITE NEGATIVE 02/26/2019 0028   LEUKOCYTESUR MODERATE (A) 02/26/2019 0028   Sepsis Labs: @LABRCNTIP (procalcitonin:4,lacticidven:4) )No results found for this or any previous visit (from the past 240 hour(s)).   Radiological Exams on  Admission: Ct Head Wo Contrast  Result Date: 02/25/2019 CLINICAL DATA:  Headache for the past 3 weeks with left hand numbness and weakness that started this morning. EXAM: CT HEAD WITHOUT CONTRAST TECHNIQUE: Contiguous axial images were obtained from the base of the skull through the vertex without intravenous contrast. COMPARISON:  MR brain dated October 31, 2016. CT head dated July 30, 2016. FINDINGS: Brain: New areas of hypodensity in the right frontal, parietal, and occipital lobes with loss of the normal gray-white matter differentiation, consistent with acute infarcts. No evidence of hemorrhage, hydrocephalus, extra-axial collection or mass lesion/mass effect. Vascular: Atherosclerotic vascular calcification of the carotid siphons. No hyperdense vessel. Skull: Normal. Negative for fracture or focal lesion. Sinuses/Orbits: No acute finding. Other: None. IMPRESSION: 1. Small acute infarcts involving the right frontal, parietal, and occipital lobes. Electronically Signed   By: Titus Dubin M.D.   On: 02/25/2019 18:42   Mr Angio Head Wo Contrast  Result Date: 02/26/2019 CLINICAL DATA:  Headache, hypertension and left arm weakness EXAM: MR HEAD WITHOUT CONTRAST MR CIRCLE OF WILLIS WITHOUT CONTRAST MRA OF THE NECK WITHOUT AND WITH CONTRAST TECHNIQUE: Multiplanar, multiecho pulse sequences of the brain, circle of willis and surrounding structures were obtained without intravenous contrast. Angiographic images of the neck were obtained using MRA technique without and with intravenous contrast. CONTRAST:  9.42mL GADAVIST GADOBUTROL 1 MMOL/ML IV SOLN COMPARISON:  None. FINDINGS: MR HEAD FINDINGS BRAIN: Multifocal acute ischemia within the right hemisphere, including along the precentral gyrus, postcentral gyrus, posterior right parietal lobe and right occipital lobe. There is no contralateral acute ischemia. There is petechial hemorrhage at the right parieto-occipital infarct site. There is mild cytotoxic edema  at the ischemic sites. The white matter signal is normal for the patient's age. The cerebral and cerebellar volume are age-appropriate. There is no hydrocephalus. The midline structures are normal. VASCULAR: The major intracranial arterial and venous sinus flow voids are normal. SKULL AND UPPER CERVICAL SPINE: Calvarial bone marrow signal is normal. There is no skull base mass. The visualized upper cervical spine and soft tissues are normal. SINUSES/ORBITS: There are no fluid levels or advanced mucosal thickening. The mastoid air cells and middle ear cavities are free of fluid. The orbits are normal. MR CIRCLE OF WILLIS FINDINGS POSTERIOR CIRCULATION: --Vertebral arteries: Normal V4 segments. --Posterior inferior cerebellar arteries (PICA): Patent origins from  the vertebral arteries. --Anterior inferior cerebellar arteries (AICA): Patent origins from the basilar artery. --Basilar artery: Normal. --Superior cerebellar arteries: Normal. --Posterior cerebral arteries: Normal. The right PCA is partially supplied by a small posterior communicating artery (p-comm). ANTERIOR CIRCULATION: --Intracranial internal carotid arteries: Severe stenosis of the distal right cavernous segment (series 7, image 90). Multifocal mild stenosis of the left cavernous and clinoid segments. --Anterior cerebral arteries (ACA): Normal. Both A1 segments are present. Patent anterior communicating artery (a-comm). --Middle cerebral arteries (MCA): Normal. MRA NECK FINDINGS Aortic arch: Normal 3 vessel aortic branching pattern. The visualized subclavian arteries are normal. Right carotid system: Normal course and caliber without stenosis or evidence of dissection. Left carotid system: Normal course and caliber without stenosis or evidence of dissection. Vertebral arteries: Codominant. Vertebral artery origins are normal. Vertebral arteries are normal in course and caliber to the vertebrobasilar confluence without stenosis or evidence of dissection.  IMPRESSION: 1. Multifocal acute ischemia within the right hemisphere, including along the precentral gyrus, postcentral gyrus, posterior right parietal lobe and right occipital lobe. 2. Petechial hemorrhage at the right parieto-occipital infarct site. No mass effect. 3. 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 Multifocal mild stenosis of the left cavernous and clinoid segments. 4. Normal MRA of the neck. No proximal embolic source is identified. Correlation with echocardiography may be helpful. Electronically Signed   By: Ulyses Jarred M.D.   On: 02/26/2019 03:01   Mr Angiogram Neck W Or Wo Contrast  Result Date: 02/26/2019 CLINICAL DATA:  Headache, hypertension and left arm weakness EXAM: MR HEAD WITHOUT CONTRAST MR CIRCLE OF WILLIS WITHOUT CONTRAST MRA OF THE NECK WITHOUT AND WITH CONTRAST TECHNIQUE: Multiplanar, multiecho pulse sequences of the brain, circle of willis and surrounding structures were obtained without intravenous contrast. Angiographic images of the neck were obtained using MRA technique without and with intravenous contrast. CONTRAST:  9.54mL GADAVIST GADOBUTROL 1 MMOL/ML IV SOLN COMPARISON:  None. FINDINGS: MR HEAD FINDINGS BRAIN: Multifocal acute ischemia within the right hemisphere, including along the precentral gyrus, postcentral gyrus, posterior right parietal lobe and right occipital lobe. There is no contralateral acute ischemia. There is petechial hemorrhage at the right parieto-occipital infarct site. There is mild cytotoxic edema at the ischemic sites. The white matter signal is normal for the patient's age. The cerebral and cerebellar volume are age-appropriate. There is no hydrocephalus. The midline structures are normal. VASCULAR: The major intracranial arterial and venous sinus flow voids are normal. SKULL AND UPPER CERVICAL SPINE: Calvarial bone marrow signal is normal. There is no skull base mass. The visualized upper cervical spine and  soft tissues are normal. SINUSES/ORBITS: There are no fluid levels or advanced mucosal thickening. The mastoid air cells and middle ear cavities are free of fluid. The orbits are normal. MR CIRCLE OF WILLIS FINDINGS POSTERIOR CIRCULATION: --Vertebral arteries: Normal V4 segments. --Posterior inferior cerebellar arteries (PICA): Patent origins from the vertebral arteries. --Anterior inferior cerebellar arteries (AICA): Patent origins from the basilar artery. --Basilar artery: Normal. --Superior cerebellar arteries: Normal. --Posterior cerebral arteries: Normal. The right PCA is partially supplied by a small posterior communicating artery (p-comm). ANTERIOR CIRCULATION: --Intracranial internal carotid arteries: Severe stenosis of the distal right cavernous segment (series 7, image 90). Multifocal mild stenosis of the left cavernous and clinoid segments. --Anterior cerebral arteries (ACA): Normal. Both A1 segments are present. Patent anterior communicating artery (a-comm). --Middle cerebral arteries (MCA): Normal. MRA NECK FINDINGS Aortic arch: Normal 3 vessel aortic branching pattern. The visualized subclavian arteries are normal.  Right carotid system: Normal course and caliber without stenosis or evidence of dissection. Left carotid system: Normal course and caliber without stenosis or evidence of dissection. Vertebral arteries: Codominant. Vertebral artery origins are normal. Vertebral arteries are normal in course and caliber to the vertebrobasilar confluence without stenosis or evidence of dissection. IMPRESSION: 1. Multifocal acute ischemia within the right hemisphere, including along the precentral gyrus, postcentral gyrus, posterior right parietal lobe and right occipital lobe. 2. Petechial hemorrhage at the right parieto-occipital infarct site. No mass effect. 3. 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 Multifocal mild stenosis of the left cavernous and clinoid segments.  4. Normal MRA of the neck. No proximal embolic source is identified. Correlation with echocardiography may be helpful. Electronically Signed   By: Ulyses Jarred M.D.   On: 02/26/2019 03:01   Mr Brain Wo Contrast  Result Date: 02/26/2019 CLINICAL DATA:  Headache, hypertension and left arm weakness EXAM: MR HEAD WITHOUT CONTRAST MR CIRCLE OF WILLIS WITHOUT CONTRAST MRA OF THE NECK WITHOUT AND WITH CONTRAST TECHNIQUE: Multiplanar, multiecho pulse sequences of the brain, circle of willis and surrounding structures were obtained without intravenous contrast. Angiographic images of the neck were obtained using MRA technique without and with intravenous contrast. CONTRAST:  9.35mL GADAVIST GADOBUTROL 1 MMOL/ML IV SOLN COMPARISON:  None. FINDINGS: MR HEAD FINDINGS BRAIN: Multifocal acute ischemia within the right hemisphere, including along the precentral gyrus, postcentral gyrus, posterior right parietal lobe and right occipital lobe. There is no contralateral acute ischemia. There is petechial hemorrhage at the right parieto-occipital infarct site. There is mild cytotoxic edema at the ischemic sites. The white matter signal is normal for the patient's age. The cerebral and cerebellar volume are age-appropriate. There is no hydrocephalus. The midline structures are normal. VASCULAR: The major intracranial arterial and venous sinus flow voids are normal. SKULL AND UPPER CERVICAL SPINE: Calvarial bone marrow signal is normal. There is no skull base mass. The visualized upper cervical spine and soft tissues are normal. SINUSES/ORBITS: There are no fluid levels or advanced mucosal thickening. The mastoid air cells and middle ear cavities are free of fluid. The orbits are normal. MR CIRCLE OF WILLIS FINDINGS POSTERIOR CIRCULATION: --Vertebral arteries: Normal V4 segments. --Posterior inferior cerebellar arteries (PICA): Patent origins from the vertebral arteries. --Anterior inferior cerebellar arteries (AICA): Patent origins  from the basilar artery. --Basilar artery: Normal. --Superior cerebellar arteries: Normal. --Posterior cerebral arteries: Normal. The right PCA is partially supplied by a small posterior communicating artery (p-comm). ANTERIOR CIRCULATION: --Intracranial internal carotid arteries: Severe stenosis of the distal right cavernous segment (series 7, image 90). Multifocal mild stenosis of the left cavernous and clinoid segments. --Anterior cerebral arteries (ACA): Normal. Both A1 segments are present. Patent anterior communicating artery (a-comm). --Middle cerebral arteries (MCA): Normal. MRA NECK FINDINGS Aortic arch: Normal 3 vessel aortic branching pattern. The visualized subclavian arteries are normal. Right carotid system: Normal course and caliber without stenosis or evidence of dissection. Left carotid system: Normal course and caliber without stenosis or evidence of dissection. Vertebral arteries: Codominant. Vertebral artery origins are normal. Vertebral arteries are normal in course and caliber to the vertebrobasilar confluence without stenosis or evidence of dissection. IMPRESSION: 1. Multifocal acute ischemia within the right hemisphere, including along the precentral gyrus, postcentral gyrus, posterior right parietal lobe and right occipital lobe. 2. Petechial hemorrhage at the right parieto-occipital infarct site. No mass effect. 3. 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 Multifocal mild stenosis of the left  cavernous and clinoid segments. 4. Normal MRA of the neck. No proximal embolic source is identified. Correlation with echocardiography may be helpful. Electronically Signed   By: Ulyses Jarred M.D.   On: 02/26/2019 03:01    EKG: Independently reviewed.  Sinus bradycardia heart rate around 51 bpm.  Assessment/Plan Principal Problem:   Acute CVA (cerebrovascular accident) ([place]) Active Problems:   Essential hypertension, benign   Chronic tension type  headache   Malignant neoplasm of prostate ([place])   Polysubstance abuse ([place])    1. Acute CVA -discussed with Dr. Amalia Hailey and on-call neurologist.  This time will be getting 2D echo.  Patient on aspirin.  Allow for permissive hypertension.  Closely monitor in telemetry check lipid panel hemoglobin A1c physical therapy consult. 2. Polysubstance abuse including cocaine alcohol and tobacco -patient advised about quitting patient is on CIWA protocol. 3. Hypertension well over permissive hypertension PRN IV hydralazine for systolic blood pressure more than [id number] and diastolic more than 123456. 4. Chronic tension type of headaches. 5. Sinus bradycardia" intermittent check TSH.   DVT prophylaxis: Lovenox. Code Status: Full code. Family Communication: Patient's family at the bedside. Disposition Plan: Home. Consults called: Neurology. Admission status: Observation.   Rise Patience MD [facility] Pager [phone].  If 7PM-7AM, please contact night-coverage [email or web address] Password [email or web address]  02/26/2019, 4:08 AM

## 2019-02-26 NOTE — ED Notes (Signed)
Patient currently at MRI

## 2019-02-26 NOTE — Discharge Summary (Signed)
Physician Discharge Summary  JESUSALBERTO Fischer [id number] DOB: Jul 31, 1960 DOA: 02/25/2019  PCP: Patient, No Pcp Per  Admit date: 02/25/2019 Discharge date: 02/26/2019  Admitted From: home Discharge disposition: home   Recommendations for Outpatient Follow-Up:  1. No driving 2. PT/OT- outpatient 3. Limit alcohol and stop smoking and using cocaine   Discharge Diagnosis:   Principal Problem:   Acute CVA (cerebrovascular accident) [facility]) Active Problems:   Essential hypertension, benign   Chronic tension type headache   Malignant neoplasm of prostate ([place])   Polysubstance abuse ([place])    Discharge Condition: Improved.  Diet recommendation: Low sodium, heart healthy  Wound care: None.  Code status: Full.   History of Present Illness:   Frank Fischer is a 58 y.o. male with history of hypertension, polysubstance abuse including cocaine alcohol and tobacco presents to the ER because of weakness of the left upper extremity.  Patient states over the last 3 weeks he has been having off-and-on headaches.  And around evening 3 PM he started having weakness of the left upper extremity.  Given the symptoms patient presented to the ER.   Hospital Course by Problem:   Stroke:   Multiple R brain infarct in setting of severe R ICA stenosis and cocaine use  CT head small R frontal, parietal and occipital lobe infarcts  MRI  Multifocal R brain infarcts. Petechial hemorrhage at R parieto-occipital infarct.   MRA head  Severe stenosis R ICA cavernous, multi mild stenosis L ICA cavernous  MRA neck Unremarkable   CTA head severe stenosis R ICA cavernous w/ additional site mild to moderate stenosis. Proximal and distal L PCA P2 stenosis.   CTA neck B ICA scattered plaques.   2D Echo EF 60-65%  LDL 128  HgbA1c 5.8  Recommend ASA 325 and plavix 75 DAPT for 3 months and then ASA alone given severe intracranial stenosis.    Hypertension  Long-term BP goal  normotensive  Hyperlipidemia - lipitor 80 LDL 128, goal < 70   Tobacco abuse  Cessation encouraged  Cocaine abuse  Cessation education provided   Alcohol abuse  advised to drink no more than 2 drink(s) a day    Medical Consultants:    neurology  Discharge Exam:   Vitals:   02/26/19 1130 02/26/19 1200  BP: (!) 135/97 135/68  Pulse: 66 66  Resp: 12 18  Temp:    SpO2: 100%    Vitals:   02/26/19 0730 02/26/19 0800 02/26/19 1130 02/26/19 1200  BP: (!) 143/80 137/87 (!) 135/97 135/68  Pulse:  76 66 66  Resp: 14 17 12 18   Temp:      TempSrc:   Oral   SpO2:  98% 100%     General exam: Appears calm and comfortable.   The results of significant diagnostics from this hospitalization (including imaging, microbiology, ancillary and laboratory) are listed below for reference.     Procedures and Diagnostic Studies:   Ct Angio Head W Or Wo Contrast  Result Date: 02/26/2019 CLINICAL DATA:  Stroke, follow-up. Additional history provided: Left-sided weakness, headache yesterday. EXAM: CT ANGIOGRAPHY HEAD AND NECK TECHNIQUE: Multidetector CT imaging of the head and neck was performed using the standard protocol during bolus administration of intravenous contrast. Multiplanar CT image reconstructions and MIPs were obtained to evaluate the vascular anatomy. Carotid stenosis measurements (when applicable) are obtained utilizing NASCET criteria, using the distal internal carotid diameter as the denominator. CONTRAST:  46mL OMNIPAQUE IOHEXOL 350 MG/ML SOLN COMPARISON:  Brain MRI/MRA 02/26/2019, MRA neck 02/26/2019, noncontrast head CT 02/25/2019 FINDINGS: CTA NECK FINDINGS Aortic arch: Standard branching. Included portions of the aortic arch demonstrate no evidence of dissection or aneurysm. Right carotid system: CCA and ICA patent within the neck without significant stenosis (50% or greater). The proximal right ICA is somewhat tortuous. No significant atherosclerotic plaque. Left  carotid system: CCA and ICA patent within the neck without significant stenosis (50% or greater). Mild predominantly noncalcified plaque within the distal common carotid artery and at the bifurcation. Vertebral arteries: The bilateral vertebral arteries are patent within the neck without significant stenosis. The left vertebral artery is slightly dominant. Skeleton: Reversal of the expected cervical lordosis. Mild cervical spondylosis. No acute bony abnormality. Other neck: No soft tissue neck mass or pathologically enlarged cervical chain lymph nodes. Thyroid unremarkable. Upper chest: Paraseptal and central lobule emphysema within the imaged lung apices. Review of the MIP images confirms the above findings CTA HEAD FINDINGS Anterior circulation: The intracranial right internal carotid artery is patent. There is prominent soft and calcified plaque within the cavernous and paraclinoid right ICA. Resultant severe stenosis within the cavernous right ICA (series 9, image 84). Mild to moderate stenosis also present within the paraclinoid right ICA. Calcified atherosclerotic plaque within the cavernous and paraclinoid left ICA. Mild-to-moderate stenosis within the cavernous left ICA with otherwise no more than mild luminal narrowing. The right middle and anterior cerebral arteries are patent without significant proximal stenosis. The left middle and anterior cerebral arteries are patent without significant proximal stenosis. No intracranial aneurysm is identified. Posterior circulation: The intracranial vertebral arteries are patent without significant stenosis. The basilar artery is patent without significant stenosis. The right posterior cerebral artery is patent without significant proximal stenosis. The left posterior cerebral artery is patent. There are mild-to-moderate focal stenoses within the proximal and distal P2 left posterior cerebral artery. Venous sinuses: Within limitations of contrast timing, no  convincing thrombus. Anatomic variants: None significant Review of the MIP images confirms the above findings IMPRESSION: CTA head: 1. 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 Confirmed severe stenosis within the cavernous right ICA. Additional sites of mild to moderate stenosis as described. 2. Mild-to-moderate focal stenoses within the proximal and distal P2 left posterior cerebral artery. CTA neck: Common carotid, internal carotid and vertebral arteries patent within the neck bilaterally without significant stenosis. Mild scattered atherosclerotic plaque within the bilateral carotid systems. No evidence of dissection or aneurysm. Electronically Signed   By: Kellie Simmering   On: 02/26/2019 09:34   Ct Head Wo Contrast  Result Date: 02/25/2019 CLINICAL DATA:  Headache for the past 3 weeks with left hand numbness and weakness that started this morning. EXAM: CT HEAD WITHOUT CONTRAST TECHNIQUE: Contiguous axial images were obtained from the base of the skull through the vertex without intravenous contrast. COMPARISON:  MR brain dated October 31, 2016. CT head dated July 30, 2016. FINDINGS: Brain: New areas of hypodensity in the right frontal, parietal, and occipital lobes with loss of the normal gray-white matter differentiation, consistent with acute infarcts. No evidence of hemorrhage, hydrocephalus, extra-axial collection or mass lesion/mass effect. Vascular: Atherosclerotic vascular calcification of the carotid siphons. No hyperdense vessel. Skull: Normal. Negative for fracture or focal lesion. Sinuses/Orbits: No acute finding. Other: None. IMPRESSION: 1. Small acute infarcts involving the right frontal, parietal, and occipital lobes. Electronically Signed   By: Titus Dubin M.D.   On: 02/25/2019 18:42   Ct Angio Neck W Or Wo Contrast  Result Date: 02/26/2019 CLINICAL  DATA:  Stroke, follow-up. Additional history provided: Left-sided weakness, headache yesterday. EXAM: CT  ANGIOGRAPHY HEAD AND NECK TECHNIQUE: Multidetector CT imaging of the head and neck was performed using the standard protocol during bolus administration of intravenous contrast. Multiplanar CT image reconstructions and MIPs were obtained to evaluate the vascular anatomy. Carotid stenosis measurements (when applicable) are obtained utilizing NASCET criteria, using the distal internal carotid diameter as the denominator. CONTRAST:  76mL OMNIPAQUE IOHEXOL 350 MG/ML SOLN COMPARISON:  Brain MRI/MRA 02/26/2019, MRA neck 02/26/2019, noncontrast head CT 02/25/2019 FINDINGS: CTA NECK FINDINGS Aortic arch: Standard branching. Included portions of the aortic arch demonstrate no evidence of dissection or aneurysm. Right carotid system: CCA and ICA patent within the neck without significant stenosis (50% or greater). The proximal right ICA is somewhat tortuous. No significant atherosclerotic plaque. Left carotid system: CCA and ICA patent within the neck without significant stenosis (50% or greater). Mild predominantly noncalcified plaque within the distal common carotid artery and at the bifurcation. Vertebral arteries: The bilateral vertebral arteries are patent within the neck without significant stenosis. The left vertebral artery is slightly dominant. Skeleton: Reversal of the expected cervical lordosis. Mild cervical spondylosis. No acute bony abnormality. Other neck: No soft tissue neck mass or pathologically enlarged cervical chain lymph nodes. Thyroid unremarkable. Upper chest: Paraseptal and central lobule emphysema within the imaged lung apices. Review of the MIP images confirms the above findings CTA HEAD FINDINGS Anterior circulation: The intracranial right internal carotid artery is patent. There is prominent soft and calcified plaque within the cavernous and paraclinoid right ICA. Resultant severe stenosis within the cavernous right ICA (series 9, image 84). Mild to moderate stenosis also present within the  paraclinoid right ICA. Calcified atherosclerotic plaque within the cavernous and paraclinoid left ICA. Mild-to-moderate stenosis within the cavernous left ICA with otherwise no more than mild luminal narrowing. The right middle and anterior cerebral arteries are patent without significant proximal stenosis. The left middle and anterior cerebral arteries are patent without significant proximal stenosis. No intracranial aneurysm is identified. Posterior circulation: The intracranial vertebral arteries are patent without significant stenosis. The basilar artery is patent without significant stenosis. The right posterior cerebral artery is patent without significant proximal stenosis. The left posterior cerebral artery is patent. There are mild-to-moderate focal stenoses within the proximal and distal P2 left posterior cerebral artery. Venous sinuses: Within limitations of contrast timing, no convincing thrombus. Anatomic variants: None significant Review of the MIP images confirms the above findings IMPRESSION: CTA head: 1. 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 Confirmed severe stenosis within the cavernous right ICA. Additional sites of mild to moderate stenosis as described. 2. Mild-to-moderate focal stenoses within the proximal and distal P2 left posterior cerebral artery. CTA neck: Common carotid, internal carotid and vertebral arteries patent within the neck bilaterally without significant stenosis. Mild scattered atherosclerotic plaque within the bilateral carotid systems. No evidence of dissection or aneurysm. Electronically Signed   By: Kellie Simmering   On: 02/26/2019 09:34   Mr Angio Head Wo Contrast  Result Date: 02/26/2019 CLINICAL DATA:  Headache, hypertension and left arm weakness EXAM: MR HEAD WITHOUT CONTRAST MR CIRCLE OF WILLIS WITHOUT CONTRAST MRA OF THE NECK WITHOUT AND WITH CONTRAST TECHNIQUE: Multiplanar, multiecho pulse sequences of the brain, circle of willis  and surrounding structures were obtained without intravenous contrast. Angiographic images of the neck were obtained using MRA technique without and with intravenous contrast. CONTRAST:  9.36mL GADAVIST GADOBUTROL 1 MMOL/ML IV SOLN COMPARISON:  None. FINDINGS: MR HEAD  FINDINGS BRAIN: Multifocal acute ischemia within the right hemisphere, including along the precentral gyrus, postcentral gyrus, posterior right parietal lobe and right occipital lobe. There is no contralateral acute ischemia. There is petechial hemorrhage at the right parieto-occipital infarct site. There is mild cytotoxic edema at the ischemic sites. The white matter signal is normal for the patient's age. The cerebral and cerebellar volume are age-appropriate. There is no hydrocephalus. The midline structures are normal. VASCULAR: The major intracranial arterial and venous sinus flow voids are normal. SKULL AND UPPER CERVICAL SPINE: Calvarial bone marrow signal is normal. There is no skull base mass. The visualized upper cervical spine and soft tissues are normal. SINUSES/ORBITS: There are no fluid levels or advanced mucosal thickening. The mastoid air cells and middle ear cavities are free of fluid. The orbits are normal. MR CIRCLE OF WILLIS FINDINGS POSTERIOR CIRCULATION: --Vertebral arteries: Normal V4 segments. --Posterior inferior cerebellar arteries (PICA): Patent origins from the vertebral arteries. --Anterior inferior cerebellar arteries (AICA): Patent origins from the basilar artery. --Basilar artery: Normal. --Superior cerebellar arteries: Normal. --Posterior cerebral arteries: Normal. The right PCA is partially supplied by a small posterior communicating artery (p-comm). ANTERIOR CIRCULATION: --Intracranial internal carotid arteries: Severe stenosis of the distal right cavernous segment (series 7, image 90). Multifocal mild stenosis of the left cavernous and clinoid segments. --Anterior cerebral arteries (ACA): Normal. Both A1 segments are  present. Patent anterior communicating artery (a-comm). --Middle cerebral arteries (MCA): Normal. MRA NECK FINDINGS Aortic arch: Normal 3 vessel aortic branching pattern. The visualized subclavian arteries are normal. Right carotid system: Normal course and caliber without stenosis or evidence of dissection. Left carotid system: Normal course and caliber without stenosis or evidence of dissection. Vertebral arteries: Codominant. Vertebral artery origins are normal. Vertebral arteries are normal in course and caliber to the vertebrobasilar confluence without stenosis or evidence of dissection. IMPRESSION: 1. Multifocal acute ischemia within the right hemisphere, including along the precentral gyrus, postcentral gyrus, posterior right parietal lobe and right occipital lobe. 2. Petechial hemorrhage at the right parieto-occipital infarct site. No mass effect. 3. 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 Multifocal mild stenosis of the left cavernous and clinoid segments. 4. Normal MRA of the neck. No proximal embolic source is identified. Correlation with echocardiography may be helpful. Electronically Signed   By: Ulyses Jarred M.D.   On: 02/26/2019 03:01   Mr Angiogram Neck W Or Wo Contrast  Result Date: 02/26/2019 CLINICAL DATA:  Headache, hypertension and left arm weakness EXAM: MR HEAD WITHOUT CONTRAST MR CIRCLE OF WILLIS WITHOUT CONTRAST MRA OF THE NECK WITHOUT AND WITH CONTRAST TECHNIQUE: Multiplanar, multiecho pulse sequences of the brain, circle of willis and surrounding structures were obtained without intravenous contrast. Angiographic images of the neck were obtained using MRA technique without and with intravenous contrast. CONTRAST:  9.77mL GADAVIST GADOBUTROL 1 MMOL/ML IV SOLN COMPARISON:  None. FINDINGS: MR HEAD FINDINGS BRAIN: Multifocal acute ischemia within the right hemisphere, including along the precentral gyrus, postcentral gyrus, posterior right parietal  lobe and right occipital lobe. There is no contralateral acute ischemia. There is petechial hemorrhage at the right parieto-occipital infarct site. There is mild cytotoxic edema at the ischemic sites. The white matter signal is normal for the patient's age. The cerebral and cerebellar volume are age-appropriate. There is no hydrocephalus. The midline structures are normal. VASCULAR: The major intracranial arterial and venous sinus flow voids are normal. SKULL AND UPPER CERVICAL SPINE: Calvarial bone marrow signal is normal. There is no skull base  mass. The visualized upper cervical spine and soft tissues are normal. SINUSES/ORBITS: There are no fluid levels or advanced mucosal thickening. The mastoid air cells and middle ear cavities are free of fluid. The orbits are normal. MR CIRCLE OF WILLIS FINDINGS POSTERIOR CIRCULATION: --Vertebral arteries: Normal V4 segments. --Posterior inferior cerebellar arteries (PICA): Patent origins from the vertebral arteries. --Anterior inferior cerebellar arteries (AICA): Patent origins from the basilar artery. --Basilar artery: Normal. --Superior cerebellar arteries: Normal. --Posterior cerebral arteries: Normal. The right PCA is partially supplied by a small posterior communicating artery (p-comm). ANTERIOR CIRCULATION: --Intracranial internal carotid arteries: Severe stenosis of the distal right cavernous segment (series 7, image 90). Multifocal mild stenosis of the left cavernous and clinoid segments. --Anterior cerebral arteries (ACA): Normal. Both A1 segments are present. Patent anterior communicating artery (a-comm). --Middle cerebral arteries (MCA): Normal. MRA NECK FINDINGS Aortic arch: Normal 3 vessel aortic branching pattern. The visualized subclavian arteries are normal. Right carotid system: Normal course and caliber without stenosis or evidence of dissection. Left carotid system: Normal course and caliber without stenosis or evidence of dissection. Vertebral arteries:  Codominant. Vertebral artery origins are normal. Vertebral arteries are normal in course and caliber to the vertebrobasilar confluence without stenosis or evidence of dissection. IMPRESSION: 1. Multifocal acute ischemia within the right hemisphere, including along the precentral gyrus, postcentral gyrus, posterior right parietal lobe and right occipital lobe. 2. Petechial hemorrhage at the right parieto-occipital infarct site. No mass effect. 3. 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 Multifocal mild stenosis of the left cavernous and clinoid segments. 4. Normal MRA of the neck. No proximal embolic source is identified. Correlation with echocardiography may be helpful. Electronically Signed   By: Ulyses Jarred M.D.   On: 02/26/2019 03:01   Mr Brain Wo Contrast  Result Date: 02/26/2019 CLINICAL DATA:  Headache, hypertension and left arm weakness EXAM: MR HEAD WITHOUT CONTRAST MR CIRCLE OF WILLIS WITHOUT CONTRAST MRA OF THE NECK WITHOUT AND WITH CONTRAST TECHNIQUE: Multiplanar, multiecho pulse sequences of the brain, circle of willis and surrounding structures were obtained without intravenous contrast. Angiographic images of the neck were obtained using MRA technique without and with intravenous contrast. CONTRAST:  9.39mL GADAVIST GADOBUTROL 1 MMOL/ML IV SOLN COMPARISON:  None. FINDINGS: MR HEAD FINDINGS BRAIN: Multifocal acute ischemia within the right hemisphere, including along the precentral gyrus, postcentral gyrus, posterior right parietal lobe and right occipital lobe. There is no contralateral acute ischemia. There is petechial hemorrhage at the right parieto-occipital infarct site. There is mild cytotoxic edema at the ischemic sites. The white matter signal is normal for the patient's age. The cerebral and cerebellar volume are age-appropriate. There is no hydrocephalus. The midline structures are normal. VASCULAR: The major intracranial arterial and venous sinus  flow voids are normal. SKULL AND UPPER CERVICAL SPINE: Calvarial bone marrow signal is normal. There is no skull base mass. The visualized upper cervical spine and soft tissues are normal. SINUSES/ORBITS: There are no fluid levels or advanced mucosal thickening. The mastoid air cells and middle ear cavities are free of fluid. The orbits are normal. MR CIRCLE OF WILLIS FINDINGS POSTERIOR CIRCULATION: --Vertebral arteries: Normal V4 segments. --Posterior inferior cerebellar arteries (PICA): Patent origins from the vertebral arteries. --Anterior inferior cerebellar arteries (AICA): Patent origins from the basilar artery. --Basilar artery: Normal. --Superior cerebellar arteries: Normal. --Posterior cerebral arteries: Normal. The right PCA is partially supplied by a small posterior communicating artery (p-comm). ANTERIOR CIRCULATION: --Intracranial internal carotid arteries: Severe stenosis of the distal  right cavernous segment (series 7, image 90). Multifocal mild stenosis of the left cavernous and clinoid segments. --Anterior cerebral arteries (ACA): Normal. Both A1 segments are present. Patent anterior communicating artery (a-comm). --Middle cerebral arteries (MCA): Normal. MRA NECK FINDINGS Aortic arch: Normal 3 vessel aortic branching pattern. The visualized subclavian arteries are normal. Right carotid system: Normal course and caliber without stenosis or evidence of dissection. Left carotid system: Normal course and caliber without stenosis or evidence of dissection. Vertebral arteries: Codominant. Vertebral artery origins are normal. Vertebral arteries are normal in course and caliber to the vertebrobasilar confluence without stenosis or evidence of dissection. IMPRESSION: 1. Multifocal acute ischemia within the right hemisphere, including along the precentral gyrus, postcentral gyrus, posterior right parietal lobe and right occipital lobe. 2. Petechial hemorrhage at the right parieto-occipital infarct site. No  mass effect. 3. 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 Multifocal mild stenosis of the left cavernous and clinoid segments. 4. Normal MRA of the neck. No proximal embolic source is identified. Correlation with echocardiography may be helpful. Electronically Signed   By: Ulyses Jarred M.D.   On: 02/26/2019 03:01     Labs:   Basic Metabolic Panel: Recent Labs  Lab 02/25/19 1617 02/25/19 1645 02/26/19 0405  NA 138 141 140  K 3.8 3.8 3.7  CL 105 107 105  CO2 21*  --  24  GLUCOSE 94 88 146*  BUN 7 7 9   CREATININE 1.34* 1.30* 1.22  CALCIUM 9.7  --  8.9  MG  --   --  2.1  PHOS  --   --  4.4   GFR CrCl cannot be calculated (Unknown ideal weight.). Liver Function Tests: Recent Labs  Lab 02/25/19 1617 02/26/19 0405  AST 34 26  ALT 28 27  ALKPHOS 88 73  BILITOT 0.6 0.8  PROT 7.0 6.1*  ALBUMIN 4.3 3.7   No results for input(s): LIPASE, AMYLASE in the last 168 hours. No results for input(s): AMMONIA in the last 168 hours. Coagulation profile Recent Labs  Lab 02/25/19 1617  INR 1.0    CBC: Recent Labs  Lab 02/25/19 1617 02/25/19 1645 02/26/19 0405  WBC 6.3  --  4.7  NEUTROABS 4.0  --   --   HGB 15.4 16.0 14.9  HCT 44.8 47.0 42.9  MCV 96.8  --  97.1  PLT 269  --  225   Cardiac Enzymes: No results for input(s): CKTOTAL, CKMB, CKMBINDEX, TROPONINI in the last 168 hours. BNP: Invalid input(s): POCBNP CBG: Recent Labs  Lab 02/25/19 2356  GLUCAP 117*   D-Dimer No results for input(s): DDIMER in the last 72 hours. Hgb A1c Recent Labs    02/26/19 0406  HGBA1C 5.8*   Lipid Profile Recent Labs    02/26/19 0406  CHOL 182  HDL 32*  LDLCALC 128*  TRIG 112  CHOLHDL 5.7   Thyroid function studies No results for input(s): TSH, T4TOTAL, T3FREE, THYROIDAB in the last 72 hours.  Invalid input(s): FREET3 Anemia work up No results for input(s): VITAMINB12, FOLATE, FERRITIN, TIBC, IRON, RETICCTPCT in the last 72  hours. Microbiology Recent Results (from the past 240 hour(s))  SARS CORONAVIRUS 2 (TAT 6-24 HRS) Nasopharyngeal Nasopharyngeal Swab     Status: None   Collection Time: 02/26/19 12:20 AM   Specimen: Nasopharyngeal Swab  Result Value Ref Range Status   SARS Coronavirus 2 NEGATIVE NEGATIVE Final    Comment: (NOTE) SARS-CoV-2 target nucleic acids are NOT DETECTED. The SARS-CoV-2 RNA  is generally detectable in upper and lower respiratory specimens during the acute phase of infection. Negative results do not preclude SARS-CoV-2 infection, do not rule out co-infections with other pathogens, and should not be used as the sole basis for treatment or other patient management decisions. Negative results must be combined with clinical observations, patient history, and epidemiological information. The expected result is Negative. Fact Sheet for Patients: SugarRoll.be Fact Sheet for Healthcare Providers: https://www.woods-mathews.com/ This test is not yet approved or cleared by the [place] FDA and  has been authorized for detection and/or diagnosis of SARS-CoV-2 by FDA under an Emergency Use Authorization (EUA). This EUA will remain  in effect (meaning this test can be used) for the duration of the COVID-19 declaration under Section 56 4(b)(1) of the Act, 21 U.S.C. section 360bbb-3(b)(1), unless the authorization is terminated or revoked sooner. Performed at [facility], [place] [address], [place], [place] [address]      Discharge Instructions:   Discharge Instructions    Ambulatory referral to Neurology   Complete by: As directed    An appointment is requested in approximately: 4 weeks. Pt is Dr. Jaynee Eagles pt. Thanks.   Diet - low sodium heart healthy   Complete by: As directed    Discharge instructions   Complete by: As directed    Illegal drug cessation Stop smoking  drink no more than 2 alcoholic drink(s) a day ASA 325 and plavix  75 together for 3 months and then ASA alone   Increase activity slowly   Complete by: As directed      Allergies as of 02/26/2019      Reactions   Penicillins Itching   Did it involve swelling of the face/tongue/throat, SOB, or low BP? No Did it involve sudden or severe rash/hives, skin peeling, or any reaction on the inside of your mouth or nose? Yes Did you need to seek medical attention at a hospital or doctor's office? No When did it last happen?09/2018 If all above answers are NO, may proceed with cephalosporin use.      Medication List    STOP taking these medications   acetaminophen 500 MG tablet Commonly known as: TYLENOL   methocarbamol 500 MG tablet Commonly known as: ROBAXIN   traMADol 50 MG tablet Commonly known as: ULTRAM     TAKE these medications   albuterol 108 (90 Base) MCG/ACT inhaler Commonly known as: VENTOLIN HFA Inhale 2 puffs into the lungs every 6 (six) hours as needed for wheezing or shortness of breath.   aspirin 325 MG EC tablet Take 1 tablet (325 mg total) by mouth daily. Start taking on: February 27, 2019   atorvastatin 80 MG tablet Commonly known as: LIPITOR Take 1 tablet (80 mg total) by mouth daily at 6 PM.   clopidogrel 75 MG tablet Commonly known as: PLAVIX Take 1 tablet (75 mg total) by mouth daily.   lisinopril 40 MG tablet Commonly known as: ZESTRIL Take 1 tablet (40 mg total) by mouth every morning. Start taking on: February 28, 2019 What changed:   when to take this  These instructions start on February 28, 2019. If you are unsure what to do until then, ask your doctor or other care provider.      Follow-up Information    Melvenia Beam, MD. Schedule an appointment as soon as possible for a visit in 4 week(s).   Specialty: Neurology Contact information: [address] [place] [address] [phone]  Time coordinating discharge: 35 min  Signed:  Geradine Girt DO  [facility] 02/26/2019, 4:07 PM

## 2019-02-26 NOTE — Progress Notes (Signed)
02/26/19 1603  PT Visit Information  Last PT Received On 02/26/19  Assistance Needed +1  History of Present Illness Pt is a 58 y/o male admitted secondary to LUE weakness. 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 PMH includes HTN, polysubstance abuse, tobacco abuse, and prostate cancer.   Precautions  Precautions Fall  Restrictions  Weight Bearing Restrictions No  Home Living  Family/patient expects to be discharged to: Private residence  Living Arrangements Spouse/significant other  Available Help at Discharge Family;Available 24 hours/day  Type of Home Apartment  Home Access Stairs to enter  Entrance Stairs-Number of Steps flight  Entrance Stairs-Rails Right;Left;Can reach both  Home Layout One level  Bathroom Therapist, music None  Prior Function  Level of Independence Independent  Communication  Communication No difficulties  Pain Assessment  Pain Assessment No/denies pain  Cognition  Arousal/Alertness Awake/alert  Behavior During Therapy WFL for tasks assessed/performed  Overall Cognitive Status No family/caregiver present to determine baseline cognitive functioning  Upper Extremity Assessment  Upper Extremity Assessment Defer to OT evaluation;LUE deficits/detail  LUE Deficits / Details LUE weakness. Reports numbness in hand, however, reports this is baseline from another injury.   Lower Extremity Assessment  Lower Extremity Assessment Overall WFL for tasks assessed  Cervical / Trunk Assessment  Cervical / Trunk Assessment Normal  Bed Mobility  Overal bed mobility Needs Assistance  Bed Mobility Supine to Sit;Sit to Supine  Supine to sit Supervision  Sit to supine Supervision  General bed mobility comments Supervision for safety.   Transfers  Overall transfer level Needs assistance  Equipment used None  Transfers Sit to/from Stand  Sit to Stand  Supervision  General transfer comment Supervision for safety.  Ambulation/Gait  Ambulation/Gait assistance Min guard  Gait Distance (Feet) 120 Feet  Assistive device None  Gait Pattern/deviations Step-through pattern;Decreased stride length  General Gait Details Min guard for mobility tasks. Educated about visual scanning to the L. Did run into object on the L during gait secondary to L peripheral vision deficits.   Gait velocity Decreased  Modified Rankin (Stroke Patients Only)  Pre-Morbid Rankin Score 0  Modified Rankin 4  Balance  Overall balance assessment Needs assistance  Sitting-balance support No upper extremity supported;Feet supported  Sitting balance-Leahy Scale Good  Standing balance support No upper extremity supported;During functional activity  Standing balance-Leahy Scale Fair  PT - End of Session  Equipment Utilized During Treatment Gait belt  Activity Tolerance Patient tolerated treatment well  Patient left in bed;with call bell/phone within reach  Nurse Communication Mobility status  PT Assessment  PT Recommendation/Assessment Patient needs continued PT services  PT Visit Diagnosis Other abnormalities of gait and mobility (R26.89);Other symptoms and signs involving the nervous system (R29.898)  PT Problem List Decreased mobility;Decreased knowledge of precautions;Decreased knowledge of use of DME;Decreased safety awareness  PT Plan  PT Frequency (ACUTE ONLY) Min 4X/week  PT Treatment/Interventions (ACUTE ONLY) Gait training;Stair training;Functional mobility training;Therapeutic activities;Therapeutic exercise;Balance training;Patient/family education  AM-PAC PT "6 Clicks" Mobility Outcome Measure (Version 2)  Help needed turning from your back to your side while in a flat bed without using bedrails? 4  Help needed moving from lying on your back to sitting on the side of a flat bed without using bedrails? 4  Help needed moving to and from a bed to a chair  (including a wheelchair)? 3  Help needed standing up from a chair using  your arms (e.g., wheelchair or bedside chair)? 3  Help needed to walk in hospital room? 3  Help needed climbing 3-5 steps with a railing?  3  6 Click Score 20  Consider Recommendation of Discharge To: Home with no services  PT Recommendation  Follow Up Recommendations Supervision for mobility/OOB;Outpatient PT  PT equipment None recommended by PT  Individuals Consulted  Consulted and Agree with Results and Recommendations Patient  Acute Rehab PT Goals  Patient Stated Goal to go home  PT Goal Formulation With patient  Time For Goal Achievement 03/12/19  Potential to Achieve Goals Good  PT Time Calculation  PT Start Time (ACUTE ONLY) 1447  PT Stop Time (ACUTE ONLY) 1502  PT Time Calculation (min) (ACUTE ONLY) 15 min  PT General Charges  $$ ACUTE PT VISIT 1 Visit  PT Evaluation  $PT Eval Moderate Complexity 1 Mod   Pt admitted secondary to problem above with deficits above. Pt with LUE weakness and L sided visual deficits. Pt requiring supervision to min guard for mobility tasks. Educated about importance of visual scanning to the L. Feel pt would benefit from outpatient PT at d/c. Will continue to follow acutely to maximize functional mobility independence and safety.    Leighton Ruff, PT, DPT  Acute Rehabilitation Services  Pager: [phone] Office: [phone]

## 2019-02-26 NOTE — ED Notes (Signed)
Tele   Breakfast ordered  

## 2019-02-26 NOTE — ED Notes (Signed)
Patient transported to CT 

## 2019-02-26 NOTE — Progress Notes (Signed)
STROKE TEAM PROGRESS NOTE   INTERVAL HISTORY Pt lying in bed, still has slight weakness at left hand, otherwise intact neuro exam. CTA head and neck done showed right ICA siphon severe stenosis which likely the cause of his stroke. He admitted cocaine use, smoking and alcohol, but willing to quit.   Vitals:   02/26/19 0700 02/26/19 0730 02/26/19 0800 02/26/19 1130  BP: 128/71 (!) 143/80 137/87 (!) 135/97  Pulse:   76 66  Resp: 16 14 17 12   Temp:      TempSrc:    Oral  SpO2:   98% 100%    CBC:  Recent Labs  Lab 02/25/19 1617 02/25/19 1645 02/26/19 0405  WBC 6.3  --  4.7  NEUTROABS 4.0  --   --   HGB 15.4 16.0 14.9  HCT 44.8 47.0 42.9  MCV 96.8  --  97.1  PLT 269  --  123456    Basic Metabolic Panel:  Recent Labs  Lab 02/25/19 1617 02/25/19 1645 02/26/19 0405  NA 138 141 140  K 3.8 3.8 3.7  CL 105 107 105  CO2 21*  --  24  GLUCOSE 94 88 146*  BUN 7 7 9   CREATININE 1.34* 1.30* 1.22  CALCIUM 9.7  --  8.9  MG  --   --  2.1  PHOS  --   --  4.4   Lipid Panel:     Component Value Date/Time   CHOL 182 02/26/2019 0406   TRIG 112 02/26/2019 0406   HDL 32 (L) 02/26/2019 0406   CHOLHDL 5.7 02/26/2019 0406   VLDL 22 02/26/2019 0406   LDLCALC 128 (H) 02/26/2019 0406   HgbA1c:  Lab Results  Component Value Date   HGBA1C 5.8 (H) 02/26/2019   Urine Drug Screen:     Component Value Date/Time   LABOPIA NONE DETECTED 02/26/2019 0028   COCAINSCRNUR POSITIVE (A) 02/26/2019 0028   COCAINSCRNUR NEG 06/21/2013 0000   LABBENZ NONE DETECTED 02/26/2019 0028   LABBENZ NEG 06/21/2013 0000   AMPHETMU NONE DETECTED 02/26/2019 0028   THCU POSITIVE (A) 02/26/2019 0028   LABBARB NONE DETECTED 02/26/2019 0028    Alcohol Level     Component Value Date/Time   ETH <10 02/26/2019 0110    IMAGING Ct Angio Head W Or Wo Contrast  Result Date: 02/26/2019 CLINICAL DATA:  Stroke, follow-up. Additional history provided: Left-sided weakness, headache yesterday. EXAM: CT ANGIOGRAPHY HEAD  AND NECK TECHNIQUE: Multidetector CT imaging of the head and neck was performed using the standard protocol during bolus administration of intravenous contrast. Multiplanar CT image reconstructions and MIPs were obtained to evaluate the vascular anatomy. Carotid stenosis measurements (when applicable) are obtained utilizing NASCET criteria, using the distal internal carotid diameter as the denominator. CONTRAST:  84mL OMNIPAQUE IOHEXOL 350 MG/ML SOLN COMPARISON:  Brain MRI/MRA 02/26/2019, MRA neck 02/26/2019, noncontrast head CT 02/25/2019 FINDINGS: CTA NECK FINDINGS Aortic arch: Standard branching. Included portions of the aortic arch demonstrate no evidence of dissection or aneurysm. Right carotid system: CCA and ICA patent within the neck without significant stenosis (50% or greater). The proximal right ICA is somewhat tortuous. No significant atherosclerotic plaque. Left carotid system: CCA and ICA patent within the neck without significant stenosis (50% or greater). Mild predominantly noncalcified plaque within the distal common carotid artery and at the bifurcation. Vertebral arteries: The bilateral vertebral arteries are patent within the neck without significant stenosis. The left vertebral artery is slightly dominant. Skeleton: Reversal of the expected cervical lordosis. Mild  cervical spondylosis. No acute bony abnormality. Other neck: No soft tissue neck mass or pathologically enlarged cervical chain lymph nodes. Thyroid unremarkable. Upper chest: Paraseptal and central lobule emphysema within the imaged lung apices. Review of the MIP images confirms the above findings CTA HEAD FINDINGS Anterior circulation: The intracranial right internal carotid artery is patent. There is prominent soft and calcified plaque within the cavernous and paraclinoid right ICA. Resultant severe stenosis within the cavernous right ICA (series 9, image 84). Mild to moderate stenosis also present within the paraclinoid right ICA.  Calcified atherosclerotic plaque within the cavernous and paraclinoid left ICA. Mild-to-moderate stenosis within the cavernous left ICA with otherwise no more than mild luminal narrowing. The right middle and anterior cerebral arteries are patent without significant proximal stenosis. The left middle and anterior cerebral arteries are patent without significant proximal stenosis. No intracranial aneurysm is identified. Posterior circulation: The intracranial vertebral arteries are patent without significant stenosis. The basilar artery is patent without significant stenosis. The right posterior cerebral artery is patent without significant proximal stenosis. The left posterior cerebral artery is patent. There are mild-to-moderate focal stenoses within the proximal and distal P2 left posterior cerebral artery. Venous sinuses: Within limitations of contrast timing, no convincing thrombus. Anatomic variants: None significant Review of the MIP images confirms the above findings IMPRESSION: CTA head: 1. 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 Confirmed severe stenosis within the cavernous right ICA. Additional sites of mild to moderate stenosis as described. 2. Mild-to-moderate focal stenoses within the proximal and distal P2 left posterior cerebral artery. CTA neck: Common carotid, internal carotid and vertebral arteries patent within the neck bilaterally without significant stenosis. Mild scattered atherosclerotic plaque within the bilateral carotid systems. No evidence of dissection or aneurysm. Electronically Signed   By: Kellie Simmering   On: 02/26/2019 09:34   Ct Head Wo Contrast  Result Date: 02/25/2019 CLINICAL DATA:  Headache for the past 3 weeks with left hand numbness and weakness that started this morning. EXAM: CT HEAD WITHOUT CONTRAST TECHNIQUE: Contiguous axial images were obtained from the base of the skull through the vertex without intravenous contrast. COMPARISON:   MR brain dated October 31, 2016. CT head dated July 30, 2016. FINDINGS: Brain: New areas of hypodensity in the right frontal, parietal, and occipital lobes with loss of the normal gray-white matter differentiation, consistent with acute infarcts. No evidence of hemorrhage, hydrocephalus, extra-axial collection or mass lesion/mass effect. Vascular: Atherosclerotic vascular calcification of the carotid siphons. No hyperdense vessel. Skull: Normal. Negative for fracture or focal lesion. Sinuses/Orbits: No acute finding. Other: None. IMPRESSION: 1. Small acute infarcts involving the right frontal, parietal, and occipital lobes. Electronically Signed   By: Titus Dubin M.D.   On: 02/25/2019 18:42   Ct Angio Neck W Or Wo Contrast  Result Date: 02/26/2019 CLINICAL DATA:  Stroke, follow-up. Additional history provided: Left-sided weakness, headache yesterday. EXAM: CT ANGIOGRAPHY HEAD AND NECK TECHNIQUE: Multidetector CT imaging of the head and neck was performed using the standard protocol during bolus administration of intravenous contrast. Multiplanar CT image reconstructions and MIPs were obtained to evaluate the vascular anatomy. Carotid stenosis measurements (when applicable) are obtained utilizing NASCET criteria, using the distal internal carotid diameter as the denominator. CONTRAST:  13mL OMNIPAQUE IOHEXOL 350 MG/ML SOLN COMPARISON:  Brain MRI/MRA 02/26/2019, MRA neck 02/26/2019, noncontrast head CT 02/25/2019 FINDINGS: CTA NECK FINDINGS Aortic arch: Standard branching. Included portions of the aortic arch demonstrate no evidence of dissection or aneurysm. Right carotid system: CCA and ICA  patent within the neck without significant stenosis (50% or greater). The proximal right ICA is somewhat tortuous. No significant atherosclerotic plaque. Left carotid system: CCA and ICA patent within the neck without significant stenosis (50% or greater). Mild predominantly noncalcified plaque within the distal common  carotid artery and at the bifurcation. Vertebral arteries: The bilateral vertebral arteries are patent within the neck without significant stenosis. The left vertebral artery is slightly dominant. Skeleton: Reversal of the expected cervical lordosis. Mild cervical spondylosis. No acute bony abnormality. Other neck: No soft tissue neck mass or pathologically enlarged cervical chain lymph nodes. Thyroid unremarkable. Upper chest: Paraseptal and central lobule emphysema within the imaged lung apices. Review of the MIP images confirms the above findings CTA HEAD FINDINGS Anterior circulation: The intracranial right internal carotid artery is patent. There is prominent soft and calcified plaque within the cavernous and paraclinoid right ICA. Resultant severe stenosis within the cavernous right ICA (series 9, image 84). Mild to moderate stenosis also present within the paraclinoid right ICA. Calcified atherosclerotic plaque within the cavernous and paraclinoid left ICA. Mild-to-moderate stenosis within the cavernous left ICA with otherwise no more than mild luminal narrowing. The right middle and anterior cerebral arteries are patent without significant proximal stenosis. The left middle and anterior cerebral arteries are patent without significant proximal stenosis. No intracranial aneurysm is identified. Posterior circulation: The intracranial vertebral arteries are patent without significant stenosis. The basilar artery is patent without significant stenosis. The right posterior cerebral artery is patent without significant proximal stenosis. The left posterior cerebral artery is patent. There are mild-to-moderate focal stenoses within the proximal and distal P2 left posterior cerebral artery. Venous sinuses: Within limitations of contrast timing, no convincing thrombus. Anatomic variants: None significant Review of the MIP images confirms the above findings IMPRESSION: CTA head: 1. Atherosclerotic disease within the  intracranial internal carotid arteries. Confirmed severe stenosis within the cavernous right ICA. Additional sites of mild to moderate stenosis as described. 2. Mild-to-moderate focal stenoses within the proximal and distal P2 left posterior cerebral artery. CTA neck: Common carotid, internal carotid and vertebral arteries patent within the neck bilaterally without significant stenosis. Mild scattered atherosclerotic plaque within the bilateral carotid systems. No evidence of dissection or aneurysm. Electronically Signed   By: Kellie Simmering   On: 02/26/2019 09:34   Mr Angio Head Wo Contrast  Result Date: 02/26/2019 CLINICAL DATA:  Headache, hypertension and left arm weakness EXAM: MR HEAD WITHOUT CONTRAST MR CIRCLE OF WILLIS WITHOUT CONTRAST MRA OF THE NECK WITHOUT AND WITH CONTRAST TECHNIQUE: Multiplanar, multiecho pulse sequences of the brain, circle of willis and surrounding structures were obtained without intravenous contrast. Angiographic images of the neck were obtained using MRA technique without and with intravenous contrast. CONTRAST:  9.31mL GADAVIST GADOBUTROL 1 MMOL/ML IV SOLN COMPARISON:  None. FINDINGS: MR HEAD FINDINGS BRAIN: Multifocal acute ischemia within the right hemisphere, including along the precentral gyrus, postcentral gyrus, posterior right parietal lobe and right occipital lobe. There is no contralateral acute ischemia. There is petechial hemorrhage at the right parieto-occipital infarct site. There is mild cytotoxic edema at the ischemic sites. The white matter signal is normal for the patient's age. The cerebral and cerebellar volume are age-appropriate. There is no hydrocephalus. The midline structures are normal. VASCULAR: The major intracranial arterial and venous sinus flow voids are normal. SKULL AND UPPER CERVICAL SPINE: Calvarial bone marrow signal is normal. There is no skull base mass. The visualized upper cervical spine and soft tissues are normal. SINUSES/ORBITS: There are  no fluid levels or advanced mucosal thickening. The mastoid air cells and middle ear cavities are free of fluid. The orbits are normal. MR CIRCLE OF WILLIS FINDINGS POSTERIOR CIRCULATION: --Vertebral arteries: Normal V4 segments. --Posterior inferior cerebellar arteries (PICA): Patent origins from the vertebral arteries. --Anterior inferior cerebellar arteries (AICA): Patent origins from the basilar artery. --Basilar artery: Normal. --Superior cerebellar arteries: Normal. --Posterior cerebral arteries: Normal. The right PCA is partially supplied by a small posterior communicating artery (p-comm). ANTERIOR CIRCULATION: --Intracranial internal carotid arteries: Severe stenosis of the distal right cavernous segment (series 7, image 90). Multifocal mild stenosis of the left cavernous and clinoid segments. --Anterior cerebral arteries (ACA): Normal. Both A1 segments are present. Patent anterior communicating artery (a-comm). --Middle cerebral arteries (MCA): Normal. MRA NECK FINDINGS Aortic arch: Normal 3 vessel aortic branching pattern. The visualized subclavian arteries are normal. Right carotid system: Normal course and caliber without stenosis or evidence of dissection. Left carotid system: Normal course and caliber without stenosis or evidence of dissection. Vertebral arteries: Codominant. Vertebral artery origins are normal. Vertebral arteries are normal in course and caliber to the vertebrobasilar confluence without stenosis or evidence of dissection. IMPRESSION: 1. Multifocal acute ischemia within the right hemisphere, including along the precentral gyrus, postcentral gyrus, posterior right parietal lobe and right occipital lobe. 2. Petechial hemorrhage at the right parieto-occipital infarct site. No mass effect. 3. 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 Multifocal mild stenosis of the left cavernous and clinoid segments. 4. Normal MRA of the neck. No proximal embolic  source is identified. Correlation with echocardiography may be helpful. Electronically Signed   By: Ulyses Jarred M.D.   On: 02/26/2019 03:01   Mr Angiogram Neck W Or Wo Contrast  Result Date: 02/26/2019 CLINICAL DATA:  Headache, hypertension and left arm weakness EXAM: MR HEAD WITHOUT CONTRAST MR CIRCLE OF WILLIS WITHOUT CONTRAST MRA OF THE NECK WITHOUT AND WITH CONTRAST TECHNIQUE: Multiplanar, multiecho pulse sequences of the brain, circle of willis and surrounding structures were obtained without intravenous contrast. Angiographic images of the neck were obtained using MRA technique without and with intravenous contrast. CONTRAST:  9.27mL GADAVIST GADOBUTROL 1 MMOL/ML IV SOLN COMPARISON:  None. FINDINGS: MR HEAD FINDINGS BRAIN: Multifocal acute ischemia within the right hemisphere, including along the precentral gyrus, postcentral gyrus, posterior right parietal lobe and right occipital lobe. There is no contralateral acute ischemia. There is petechial hemorrhage at the right parieto-occipital infarct site. There is mild cytotoxic edema at the ischemic sites. The white matter signal is normal for the patient's age. The cerebral and cerebellar volume are age-appropriate. There is no hydrocephalus. The midline structures are normal. VASCULAR: The major intracranial arterial and venous sinus flow voids are normal. SKULL AND UPPER CERVICAL SPINE: Calvarial bone marrow signal is normal. There is no skull base mass. The visualized upper cervical spine and soft tissues are normal. SINUSES/ORBITS: There are no fluid levels or advanced mucosal thickening. The mastoid air cells and middle ear cavities are free of fluid. The orbits are normal. MR CIRCLE OF WILLIS FINDINGS POSTERIOR CIRCULATION: --Vertebral arteries: Normal V4 segments. --Posterior inferior cerebellar arteries (PICA): Patent origins from the vertebral arteries. --Anterior inferior cerebellar arteries (AICA): Patent origins from the basilar artery.  --Basilar artery: Normal. --Superior cerebellar arteries: Normal. --Posterior cerebral arteries: Normal. The right PCA is partially supplied by a small posterior communicating artery (p-comm). ANTERIOR CIRCULATION: --Intracranial internal carotid arteries: Severe stenosis of the distal right cavernous segment (series 7, image 90). Multifocal mild stenosis of  the left cavernous and clinoid segments. --Anterior cerebral arteries (ACA): Normal. Both A1 segments are present. Patent anterior communicating artery (a-comm). --Middle cerebral arteries (MCA): Normal. MRA NECK FINDINGS Aortic arch: Normal 3 vessel aortic branching pattern. The visualized subclavian arteries are normal. Right carotid system: Normal course and caliber without stenosis or evidence of dissection. Left carotid system: Normal course and caliber without stenosis or evidence of dissection. Vertebral arteries: Codominant. Vertebral artery origins are normal. Vertebral arteries are normal in course and caliber to the vertebrobasilar confluence without stenosis or evidence of dissection. IMPRESSION: 1. Multifocal acute ischemia within the right hemisphere, including along the precentral gyrus, postcentral gyrus, posterior right parietal lobe and right occipital lobe. 2. Petechial hemorrhage at the right parieto-occipital infarct site. No mass effect. 3. 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 Multifocal mild stenosis of the left cavernous and clinoid segments. 4. Normal MRA of the neck. No proximal embolic source is identified. Correlation with echocardiography may be helpful. Electronically Signed   By: Ulyses Jarred M.D.   On: 02/26/2019 03:01   Mr Brain Wo Contrast  Result Date: 02/26/2019 CLINICAL DATA:  Headache, hypertension and left arm weakness EXAM: MR HEAD WITHOUT CONTRAST MR CIRCLE OF WILLIS WITHOUT CONTRAST MRA OF THE NECK WITHOUT AND WITH CONTRAST TECHNIQUE: Multiplanar, multiecho pulse sequences  of the brain, circle of willis and surrounding structures were obtained without intravenous contrast. Angiographic images of the neck were obtained using MRA technique without and with intravenous contrast. CONTRAST:  9.43mL GADAVIST GADOBUTROL 1 MMOL/ML IV SOLN COMPARISON:  None. FINDINGS: MR HEAD FINDINGS BRAIN: Multifocal acute ischemia within the right hemisphere, including along the precentral gyrus, postcentral gyrus, posterior right parietal lobe and right occipital lobe. There is no contralateral acute ischemia. There is petechial hemorrhage at the right parieto-occipital infarct site. There is mild cytotoxic edema at the ischemic sites. The white matter signal is normal for the patient's age. The cerebral and cerebellar volume are age-appropriate. There is no hydrocephalus. The midline structures are normal. VASCULAR: The major intracranial arterial and venous sinus flow voids are normal. SKULL AND UPPER CERVICAL SPINE: Calvarial bone marrow signal is normal. There is no skull base mass. The visualized upper cervical spine and soft tissues are normal. SINUSES/ORBITS: There are no fluid levels or advanced mucosal thickening. The mastoid air cells and middle ear cavities are free of fluid. The orbits are normal. MR CIRCLE OF WILLIS FINDINGS POSTERIOR CIRCULATION: --Vertebral arteries: Normal V4 segments. --Posterior inferior cerebellar arteries (PICA): Patent origins from the vertebral arteries. --Anterior inferior cerebellar arteries (AICA): Patent origins from the basilar artery. --Basilar artery: Normal. --Superior cerebellar arteries: Normal. --Posterior cerebral arteries: Normal. The right PCA is partially supplied by a small posterior communicating artery (p-comm). ANTERIOR CIRCULATION: --Intracranial internal carotid arteries: Severe stenosis of the distal right cavernous segment (series 7, image 90). Multifocal mild stenosis of the left cavernous and clinoid segments. --Anterior cerebral arteries  (ACA): Normal. Both A1 segments are present. Patent anterior communicating artery (a-comm). --Middle cerebral arteries (MCA): Normal. MRA NECK FINDINGS Aortic arch: Normal 3 vessel aortic branching pattern. The visualized subclavian arteries are normal. Right carotid system: Normal course and caliber without stenosis or evidence of dissection. Left carotid system: Normal course and caliber without stenosis or evidence of dissection. Vertebral arteries: Codominant. Vertebral artery origins are normal. Vertebral arteries are normal in course and caliber to the vertebrobasilar confluence without stenosis or evidence of dissection. IMPRESSION: 1. Multifocal acute ischemia within the right hemisphere, including along  the precentral gyrus, postcentral gyrus, posterior right parietal lobe and right occipital lobe. 2. Petechial hemorrhage at the right parieto-occipital infarct site. No mass effect. 3. 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 Multifocal mild stenosis of the left cavernous and clinoid segments. 4. Normal MRA of the neck. No proximal embolic source is identified. Correlation with echocardiography may be helpful. Electronically Signed   By: Ulyses Jarred M.D.   On: 02/26/2019 03:01    PHYSICAL EXAM  Temp:  [98.4 F (36.9 C)-98.8 F (37.1 C)] 98.4 F (36.9 C) (10/08 1838) Pulse Rate:  [53-103] 66 (10/09 1200) Resp:  [12-25] 18 (10/09 1200) BP: (108-144)/(53-97) 135/68 (10/09 1200) SpO2:  [98 %-100 %] 100 % (10/09 1130)  General - Well nourished, well developed, in no apparent distress.  Ophthalmologic - fundi not visualized due to noncooperation.  Cardiovascular - Regular rhythm and rate.  Mental Status -  Level of arousal and orientation to time, place, and person were intact. Language including expression, naming, repetition, comprehension was assessed and found intact. Fund of Knowledge was assessed and was intact.  Cranial Nerves II - XII - II -  Visual field intact OU. III, IV, VI - Extraocular movements intact. V - Facial sensation intact bilaterally. VII - Facial movement intact bilaterally. VIII - Hearing & vestibular intact bilaterally. X - Palate elevates symmetrically. XI - Chin turning & shoulder shrug intact bilaterally. XII - Tongue protrusion intact.  Motor Strength - The patient's strength was normal in all extremities except left hand grip 4+/5 and pronator drift was absent.  Bulk was normal and fasciculations were absent.   Motor Tone - Muscle tone was assessed at the neck and appendages and was normal.  Reflexes - The patient's reflexes were symmetrical in all extremities and he had no pathological reflexes.  Sensory - Light touch, temperature/pinprick were assessed and were symmetrical.    Coordination - The patient had normal movements in the hands with no ataxia or dysmetria.  Tremor was absent.  Gait and Station - deferred.   ASSESSMENT/PLAN [name] is a 58 y.o. male with history of poly substance abuse, chronic HA, previous stroke, HTN, prostate cancer presenting with HA x 3 weeks followed by new onset of L hand numbness and weakness day of admission.   Stroke:   Multiple R brain infarct in setting of severe R ICA stenosis and cocaine use  CT head small R frontal, parietal and occipital lobe infarcts  MRI  Multifocal R brain infarcts. Petechial hemorrhage at R parieto-occipital infarct.   MRA head  Severe stenosis R ICA cavernous, multi mild stenosis L ICA cavernous  MRA neck Unremarkable   CTA head severe stenosis R ICA cavernous w/ additional site mild to moderate stenosis. Proximal and distal L PCA P2 stenosis.   CTA neck B ICA scattered plaques.   2D Echo EF 60-65%  LDL 128  HgbA1c 5.8  Lovenox 40 mg sq daily for VTE prophylaxis  No antithrombotic prior to admission, now on aspirin 325 mg daily. Recommend ASA 325 and plavix 75 DAPT for 3 months and then ASA alone given severe  intracranial stenosis.   Therapy recommendations:  pending   Disposition:  pending   Hypertension  Stable . Permissive hypertension (OK if < 220/120) but gradually normalize in 5-7 days . Long-term BP goal normotensive  Hyperlipidemia  Home meds:  No statin  Now on lipitor 80  LDL 128, goal < 70  Continue statin at discharge  Tobacco abuse  Current smoker  Smoking cessation counseling provided  Pt is willing to quit  Cocaine abuse  UDS positive for cocaine  Pt admitted recent use  Cessation education provided  Pt is willing to quit  Alcohol abuse  On FA/B1/MVI  CIWA protocol  advised to drink no more than 2 drink(s) a day  Other Stroke Risk Factors  Substance abuse (Marijuana) UDS: THC POSITIVE. Patient advised to stop using due to stroke risk.  Other Active Problems  Chronic tension HA  Sinus bradycardia  Hospital day # 0  Neurology will sign off. Please call with questions. Pt will follow up with Dr. Jaynee Eagles at [facility] in about 4 weeks. Thanks for the consult.  Rosalin Hawking, MD PhD Stroke Neurology 02/26/2019 3:53 PM   To contact Stroke Continuity provider, please refer to [email or web address] After hours, contact General Neurology

## 2019-02-26 NOTE — Progress Notes (Signed)
  Echocardiogram 2D Echocardiogram has been performed.  Jennette Dubin 02/26/2019, 1:56 PM

## 2019-02-26 NOTE — ED Notes (Addendum)
ED TO INPATIENT HANDOFF REPORT  ED Nurse Name and Phone #: Thurmond Butts [phone]  S Name/Age/Gender Frank Fischer 58 y.o. male Room/Bed: 053C/053C  Code Status   Code Status: Full Code  Home/SNF/Other Home Patient oriented to: self, place, time and situation Is this baseline? Yes   Triage Complete: Triage complete  Chief Complaint HA; Hand Weakness (lt)  Triage Note Pt c/o headache x 3 weeks and left hand numbness and weakness that started this morning. No facial droop or drift noted, A%O x 4, ambulatory without difficulty.    Allergies Allergies  Allergen Reactions  . Penicillins Itching    Did it involve swelling of the face/tongue/throat, SOB, or low BP? No Did it involve sudden or severe rash/hives, skin peeling, or any reaction on the inside of your mouth or nose? Yes Did you need to seek medical attention at a hospital or doctor's office? No When did it last happen?09/2018 If all above answers are "NO", may proceed with cephalosporin use.    Level of Care/Admitting Diagnosis ED Disposition    ED Disposition Condition [facility] Area: [place] [[id number]]  Level of Care: Telemetry Medical [104]  Covid Evaluation: Asymptomatic Screening Protocol (No Symptoms)  Diagnosis: Acute CVA (cerebrovascular accident) [id number]  Admitting Physician: Maida Sale  Attending Physician: Geradine Girt [[id number]]  Estimated length of stay: past midnight tomorrow  Certification:: I certify this patient will need inpatient services for at least 2 midnights  PT Class (Do Not Modify): Inpatient [101]  PT Acc Code (Do Not Modify): Private [1]       B Medical/Surgery History Past Medical History:  Diagnosis Date  . Arthritis   . Cervical stenosis of spine   . Chronic headaches   . Dental caries    periodontal disease  . Frequency of urination   . History of cerebral infarction 11-28-2017  per pt never had symptoms   per MRI  imaging 10-31-2016 2 small chronic lacunar infartions in the right frontal periventricular white matter , and chronic left medial orbital fracture (as seen 2014 MRI)  . Hypertension   . Noncompliance with medication regimen   . Prostate cancer [facility]) urologist-  dr winter/  oncologist-  dr Tammi Klippel   dx 07-21-2017--- Stage T1c, Gleason 4+3,  PSA 13.40,  vol 49.7cc--  scheduled for radioactive seed implants 12-03-2017   Past Surgical History:  Procedure Laterality Date  . ABDOMINAL EXPLORATION SURGERY  age 70   per pt had Small Bowel Resection and Appendectomy  . APPENDECTOMY    . ARM SURGERY Left 1999   REPAIR INJURY-- "ARM CUT ABOUT OFF"  . PROSTATE BIOPSY  07/21/2017  dr winter office  . RADIOACTIVE SEED IMPLANT N/A 12/03/2017   Procedure: RADIOACTIVE SEED IMPLANT/BRACHYTHERAPY IMPLANT;  Surgeon: Ceasar Mons, MD;  Location: [facility];  Service: Urology;  Laterality: N/A;  ONLY NEEDS 120 MIN  . SPACE OAR INSTILLATION N/A 12/03/2017   Procedure: SPACE OAR INSTILLATION;  Surgeon: Ceasar Mons, MD;  Location: [facility];  Service: Urology;  Laterality: N/A;  . TOOTH EXTRACTION N/A 09/21/2018   Procedure: DENTAL EXTRACTIONS WITH ALVEOLIPLASTY;  Surgeon: Diona Browner, DDS;  Location: [place];  Service: Oral Surgery;  Laterality: N/A;     A IV Location/Drains/Wounds Patient Lines/Drains/Airways Status   Active Line/Drains/Airways    Name:   Placement date:   Placement time:   Site:   Days:   Peripheral IV 02/25/19  Right Forearm   02/25/19    2325    Forearm   1   Incision (Closed) 12/03/17 Perineum Other (Comment)   12/03/17    0823     450   Incision (Closed) 09/21/18 Lip Other (Comment)   09/21/18    0827     158          Intake/Output Last 24 hours No intake or output data in the 24 hours ending 02/26/19 1509  Labs/Imaging Results for orders placed or performed during the hospital encounter of 02/25/19 (from the past 48  hour(s))  Protime-INR     Status: None   Collection Time: 02/25/19  4:17 PM  Result Value Ref Range   Prothrombin Time 13.2 11.4 - 15.2 seconds   INR 1.0 0.8 - 1.2    Comment: (NOTE) INR goal varies based on device and disease states. Performed at [facility], [place] [address], [place], [place] [address]   APTT     Status: None   Collection Time: 02/25/19  4:17 PM  Result Value Ref Range   aPTT 25 24 - 36 seconds    Comment: Performed at [facility] [address], [place] [address]  CBC     Status: None   Collection Time: 02/25/19  4:17 PM  Result Value Ref Range   WBC 6.3 4.0 - 10.5 K/uL   RBC 4.63 4.22 - 5.81 MIL/uL   Hemoglobin 15.4 13.0 - 17.0 g/dL   HCT 44.8 39.0 - 52.0 %   MCV 96.8 80.0 - 100.0 fL   MCH 33.3 26.0 - 34.0 pg   MCHC 34.4 30.0 - 36.0 g/dL   RDW 14.7 11.5 - 15.5 %   Platelets 269 150 - 400 K/uL   nRBC 0.0 0.0 - 0.2 %    Comment: Performed at [facility], [place] [address] [place], [place] [address]  Differential     Status: None   Collection Time: 02/25/19  4:17 PM  Result Value Ref Range   Neutrophils Relative % 64 %   Neutro Abs 4.0 1.7 - 7.7 K/uL   Lymphocytes Relative 27 %   Lymphs Abs 1.7 0.7 - 4.0 K/uL   Monocytes Relative 8 %   Monocytes Absolute 0.5 0.1 - 1.0 K/uL   Eosinophils Relative 0 %   Eosinophils Absolute 0.0 0.0 - 0.5 K/uL   Basophils Relative 1 %   Basophils Absolute 0.0 0.0 - 0.1 K/uL   Immature Granulocytes 0 %   Abs Immature Granulocytes 0.02 0.00 - 0.07 K/uL    Comment: Performed at [facility] [address] [place], [place] [address]  Comprehensive metabolic panel     Status: Abnormal   Collection Time: 02/25/19  4:17 PM  Result Value Ref Range   Sodium 138 135 - 145 mmol/L   Potassium 3.8 3.5 - 5.1 mmol/L   Chloride 105 98 - 111 mmol/L   CO2 21 (L) 22 - 32 mmol/L   Glucose, Bld 94 70 - 99 mg/dL   BUN 7 6 - 20 mg/dL   Creatinine, Ser 1.34 (H) 0.61 - 1.24 mg/dL   Calcium 9.7 8.9 -  10.3 mg/dL   Total Protein 7.0 6.5 - 8.1 g/dL   Albumin 4.3 3.5 - 5.0 g/dL   AST 34 15 - 41 U/L   ALT 28 0 - 44 U/L   Alkaline Phosphatase 88 38 - 126 U/L   Total Bilirubin 0.6 0.3 -  1.2 mg/dL   GFR calc non Af Amer 58 (L) >60 mL/min   GFR calc Af Amer >60 >60 mL/min   Anion gap 12 5 - 15    Comment: Performed at [facility] [address], [place], [place] [address]  I-stat chem 8, ED     Status: Abnormal   Collection Time: 02/25/19  4:45 PM  Result Value Ref Range   Sodium 141 135 - 145 mmol/L   Potassium 3.8 3.5 - 5.1 mmol/L   Chloride 107 98 - 111 mmol/L   BUN 7 6 - 20 mg/dL   Creatinine, Ser 1.30 (H) 0.61 - 1.24 mg/dL   Glucose, Bld 88 70 - 99 mg/dL   Calcium, Ion 1.15 1.15 - 1.40 mmol/L   TCO2 22 22 - 32 mmol/L   Hemoglobin 16.0 13.0 - 17.0 g/dL   HCT 47.0 39.0 - 52.0 %  CBG monitoring, ED     Status: Abnormal   Collection Time: 02/25/19 11:56 PM  Result Value Ref Range   Glucose-Capillary 117 (H) 70 - 99 mg/dL  SARS CORONAVIRUS 2 (TAT 6-24 HRS) Nasopharyngeal Nasopharyngeal Swab     Status: None   Collection Time: 02/26/19 12:20 AM   Specimen: Nasopharyngeal Swab  Result Value Ref Range   SARS Coronavirus 2 NEGATIVE NEGATIVE    Comment: (NOTE) SARS-CoV-2 target nucleic acids are NOT DETECTED. The SARS-CoV-2 RNA is generally detectable in upper and lower respiratory specimens during the acute phase of infection. Negative results do not preclude SARS-CoV-2 infection, do not rule out co-infections with other pathogens, and should not be used as the sole basis for treatment or other patient management decisions. Negative results must be combined with clinical observations, patient history, and epidemiological information. The expected result is Negative. Fact Sheet for Patients: SugarRoll.be Fact Sheet for Healthcare Providers: https://www.woods-mathews.com/ This test is not yet approved or cleared by the [place]  FDA and  has been authorized for detection and/or diagnosis of SARS-CoV-2 by FDA under an Emergency Use Authorization (EUA). This EUA will remain  in effect (meaning this test can be used) for the duration of the COVID-19 declaration under Section 56 4(b)(1) of the Act, 21 U.S.C. section 360bbb-3(b)(1), unless the authorization is terminated or revoked sooner. Performed at [facility], [place] [address] [place], [place] [address]   Urine rapid drug screen (hosp performed)     Status: Abnormal   Collection Time: 02/26/19 12:28 AM  Result Value Ref Range   Opiates NONE DETECTED NONE DETECTED   Cocaine POSITIVE (A) NONE DETECTED   Benzodiazepines NONE DETECTED NONE DETECTED   Amphetamines NONE DETECTED NONE DETECTED   Tetrahydrocannabinol POSITIVE (A) NONE DETECTED   Barbiturates NONE DETECTED NONE DETECTED    Comment: (NOTE) DRUG SCREEN FOR MEDICAL PURPOSES ONLY.  IF CONFIRMATION IS NEEDED FOR ANY PURPOSE, NOTIFY LAB WITHIN 5 DAYS. LOWEST DETECTABLE LIMITS FOR URINE DRUG SCREEN Drug Class                     Cutoff (ng/mL) Amphetamine and metabolites    1000 Barbiturate and metabolites    200 Benzodiazepine                 [id number] Tricyclics and metabolites     300 Opiates and metabolites        300 Cocaine and metabolites        300 THC  50 Performed at [facility], [place] [address] [place], [place] [address]   Urinalysis, Routine w reflex microscopic     Status: Abnormal   Collection Time: 02/26/19 12:28 AM  Result Value Ref Range   Color, Urine YELLOW YELLOW   APPearance CLEAR CLEAR   Specific Gravity, Urine 1.021 1.005 - 1.030   pH 5.0 5.0 - 8.0   Glucose, UA NEGATIVE NEGATIVE mg/dL   Hgb urine dipstick NEGATIVE NEGATIVE   Bilirubin Urine NEGATIVE NEGATIVE   Ketones, ur NEGATIVE NEGATIVE mg/dL   Protein, ur NEGATIVE NEGATIVE mg/dL   Nitrite NEGATIVE NEGATIVE   Leukocytes,Ua MODERATE (A) NEGATIVE   RBC / HPF 0-5 0 - 5 RBC/hpf    WBC, UA 21-50 0 - 5 WBC/hpf   Bacteria, UA NONE SEEN NONE SEEN   Mucus PRESENT     Comment: Performed at [facility] [address] [place], [place] [address]  Ethanol     Status: None   Collection Time: 02/26/19  1:10 AM  Result Value Ref Range   Alcohol, Ethyl (B) <10 <10 mg/dL    Comment: (NOTE) Lowest detectable limit for serum alcohol is 10 mg/dL. For medical purposes only. Performed at [facility], [place] [address] [place], [place] [address]   HIV Antibody (routine testing w rflx)     Status: None   Collection Time: 02/26/19  4:05 AM  Result Value Ref Range   HIV Screen 4th Generation wRfx NON REACTIVE NON REACTIVE    Comment: Performed at [facility] [address], [place] [address]  CBC     Status: None   Collection Time: 02/26/19  4:05 AM  Result Value Ref Range   WBC 4.7 4.0 - 10.5 K/uL   RBC 4.42 4.22 - 5.81 MIL/uL   Hemoglobin 14.9 13.0 - 17.0 g/dL   HCT 42.9 39.0 - 52.0 %   MCV 97.1 80.0 - 100.0 fL   MCH 33.7 26.0 - 34.0 pg   MCHC 34.7 30.0 - 36.0 g/dL   RDW 14.7 11.5 - 15.5 %   Platelets 225 150 - 400 K/uL   nRBC 0.0 0.0 - 0.2 %    Comment: Performed at [facility], [place] [address] [place], [place] [address]  Comprehensive metabolic panel     Status: Abnormal   Collection Time: 02/26/19  4:05 AM  Result Value Ref Range   Sodium 140 135 - 145 mmol/L   Potassium 3.7 3.5 - 5.1 mmol/L   Chloride 105 98 - 111 mmol/L   CO2 24 22 - 32 mmol/L   Glucose, Bld 146 (H) 70 - 99 mg/dL   BUN 9 6 - 20 mg/dL   Creatinine, Ser 1.22 0.61 - 1.24 mg/dL   Calcium 8.9 8.9 - 10.3 mg/dL   Total Protein 6.1 (L) 6.5 - 8.1 g/dL   Albumin 3.7 3.5 - 5.0 g/dL   AST 26 15 - 41 U/L   ALT 27 0 - 44 U/L   Alkaline Phosphatase 73 38 - 126 U/L   Total Bilirubin 0.8 0.3 - 1.2 mg/dL   GFR calc non Af Amer >60 >60 mL/min   GFR calc Af Amer >60 >60 mL/min   Anion gap 11 5 - 15    Comment: Performed at [facility] [address]  [place], [place] [address]  Magnesium     Status: None   Collection Time: 02/26/19  4:05 AM  Result Value  Ref Range   Magnesium 2.1 1.7 - 2.4 mg/dL    Comment: Performed at [facility] [address], [place], [place] [address]  Phosphorus     Status: None   Collection Time: 02/26/19  4:05 AM  Result Value Ref Range   Phosphorus 4.4 2.5 - 4.6 mg/dL    Comment: Performed at [facility] [address], [place], [place] [address]  Hemoglobin A1c     Status: Abnormal   Collection Time: 02/26/19  4:06 AM  Result Value Ref Range   Hgb A1c MFr Bld 5.8 (H) 4.8 - 5.6 %    Comment: (NOTE) Pre diabetes:          5.7%-6.4% Diabetes:              >6.4% Glycemic control for   <7.0% adults with diabetes    Mean Plasma Glucose 119.76 mg/dL    Comment: Performed at [facility] [address] [place], [place] [address]  Lipid panel     Status: Abnormal   Collection Time: 02/26/19  4:06 AM  Result Value Ref Range   Cholesterol 182 0 - 200 mg/dL   Triglycerides 112 <150 mg/dL   HDL 32 (L) >40 mg/dL   Total CHOL/HDL Ratio 5.7 RATIO   VLDL 22 0 - 40 mg/dL   LDL Cholesterol 128 (H) 0 - 99 mg/dL    Comment:        Total Cholesterol/HDL:CHD Risk Coronary Heart Disease Risk Table                     Men   Women  1/2 Average Risk   3.4   3.3  Average Risk       5.0   4.4  2 X Average Risk   9.6   7.1  3 X Average Risk  23.4   11.0        Use the calculated Patient Ratio above and the CHD Risk Table to determine the patient's CHD Risk.        ATP III CLASSIFICATION (LDL):  <100     mg/dL   Optimal  100-129  mg/dL   Near or Above                    Optimal  130-159  mg/dL   Borderline  160-189  mg/dL   High  >190     mg/dL   Very High Performed at [facility] [address], [place], [place] [address]    Ct Angio Head W Or Wo Contrast  Result Date: 02/26/2019 CLINICAL DATA:  Stroke, follow-up. Additional history provided: Left-sided weakness, headache yesterday.  EXAM: CT ANGIOGRAPHY HEAD AND NECK TECHNIQUE: Multidetector CT imaging of the head and neck was performed using the standard protocol during bolus administration of intravenous contrast. Multiplanar CT image reconstructions and MIPs were obtained to evaluate the vascular anatomy. Carotid stenosis measurements (when applicable) are obtained utilizing NASCET criteria, using the distal internal carotid diameter as the denominator. CONTRAST:  35mL OMNIPAQUE IOHEXOL 350 MG/ML SOLN COMPARISON:  Brain MRI/MRA 02/26/2019, MRA neck 02/26/2019, noncontrast head CT 02/25/2019 FINDINGS: CTA NECK FINDINGS Aortic arch: Standard branching. Included portions of the aortic arch demonstrate no evidence of dissection or aneurysm. Right carotid system: CCA and ICA patent within the neck without significant stenosis (50% or greater). The proximal right ICA is somewhat tortuous. No significant atherosclerotic plaque. Left carotid system: CCA and ICA  patent within the neck without significant stenosis (50% or greater). Mild predominantly noncalcified plaque within the distal common carotid artery and at the bifurcation. Vertebral arteries: The bilateral vertebral arteries are patent within the neck without significant stenosis. The left vertebral artery is slightly dominant. Skeleton: Reversal of the expected cervical lordosis. Mild cervical spondylosis. No acute bony abnormality. Other neck: No soft tissue neck mass or pathologically enlarged cervical chain lymph nodes. Thyroid unremarkable. Upper chest: Paraseptal and central lobule emphysema within the imaged lung apices. Review of the MIP images confirms the above findings CTA HEAD FINDINGS Anterior circulation: The intracranial right internal carotid artery is patent. There is prominent soft and calcified plaque within the cavernous and paraclinoid right ICA. Resultant severe stenosis within the cavernous right ICA (series 9, image 84). Mild to moderate stenosis also present within  the paraclinoid right ICA. Calcified atherosclerotic plaque within the cavernous and paraclinoid left ICA. Mild-to-moderate stenosis within the cavernous left ICA with otherwise no more than mild luminal narrowing. The right middle and anterior cerebral arteries are patent without significant proximal stenosis. The left middle and anterior cerebral arteries are patent without significant proximal stenosis. No intracranial aneurysm is identified. Posterior circulation: The intracranial vertebral arteries are patent without significant stenosis. The basilar artery is patent without significant stenosis. The right posterior cerebral artery is patent without significant proximal stenosis. The left posterior cerebral artery is patent. There are mild-to-moderate focal stenoses within the proximal and distal P2 left posterior cerebral artery. Venous sinuses: Within limitations of contrast timing, no convincing thrombus. Anatomic variants: None significant Review of the MIP images confirms the above findings IMPRESSION: CTA head: 1. 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 Confirmed severe stenosis within the cavernous right ICA. Additional sites of mild to moderate stenosis as described. 2. Mild-to-moderate focal stenoses within the proximal and distal P2 left posterior cerebral artery. CTA neck: Common carotid, internal carotid and vertebral arteries patent within the neck bilaterally without significant stenosis. Mild scattered atherosclerotic plaque within the bilateral carotid systems. No evidence of dissection or aneurysm. Electronically Signed   By: Kellie Simmering   On: 02/26/2019 09:34   Ct Head Wo Contrast  Result Date: 02/25/2019 CLINICAL DATA:  Headache for the past 3 weeks with left hand numbness and weakness that started this morning. EXAM: CT HEAD WITHOUT CONTRAST TECHNIQUE: Contiguous axial images were obtained from the base of the skull through the vertex without  intravenous contrast. COMPARISON:  MR brain dated October 31, 2016. CT head dated July 30, 2016. FINDINGS: Brain: New areas of hypodensity in the right frontal, parietal, and occipital lobes with loss of the normal gray-white matter differentiation, consistent with acute infarcts. No evidence of hemorrhage, hydrocephalus, extra-axial collection or mass lesion/mass effect. Vascular: Atherosclerotic vascular calcification of the carotid siphons. No hyperdense vessel. Skull: Normal. Negative for fracture or focal lesion. Sinuses/Orbits: No acute finding. Other: None. IMPRESSION: 1. Small acute infarcts involving the right frontal, parietal, and occipital lobes. Electronically Signed   By: Titus Dubin M.D.   On: 02/25/2019 18:42   Ct Angio Neck W Or Wo Contrast  Result Date: 02/26/2019 CLINICAL DATA:  Stroke, follow-up. Additional history provided: Left-sided weakness, headache yesterday. EXAM: CT ANGIOGRAPHY HEAD AND NECK TECHNIQUE: Multidetector CT imaging of the head and neck was performed using the standard protocol during bolus administration of intravenous contrast. Multiplanar CT image reconstructions and MIPs were obtained to evaluate the vascular anatomy. Carotid stenosis measurements (when applicable) are obtained utilizing NASCET criteria, using the distal  internal carotid diameter as the denominator. CONTRAST:  70mL OMNIPAQUE IOHEXOL 350 MG/ML SOLN COMPARISON:  Brain MRI/MRA 02/26/2019, MRA neck 02/26/2019, noncontrast head CT 02/25/2019 FINDINGS: CTA NECK FINDINGS Aortic arch: Standard branching. Included portions of the aortic arch demonstrate no evidence of dissection or aneurysm. Right carotid system: CCA and ICA patent within the neck without significant stenosis (50% or greater). The proximal right ICA is somewhat tortuous. No significant atherosclerotic plaque. Left carotid system: CCA and ICA patent within the neck without significant stenosis (50% or greater). Mild predominantly noncalcified  plaque within the distal common carotid artery and at the bifurcation. Vertebral arteries: The bilateral vertebral arteries are patent within the neck without significant stenosis. The left vertebral artery is slightly dominant. Skeleton: Reversal of the expected cervical lordosis. Mild cervical spondylosis. No acute bony abnormality. Other neck: No soft tissue neck mass or pathologically enlarged cervical chain lymph nodes. Thyroid unremarkable. Upper chest: Paraseptal and central lobule emphysema within the imaged lung apices. Review of the MIP images confirms the above findings CTA HEAD FINDINGS Anterior circulation: The intracranial right internal carotid artery is patent. There is prominent soft and calcified plaque within the cavernous and paraclinoid right ICA. Resultant severe stenosis within the cavernous right ICA (series 9, image 84). Mild to moderate stenosis also present within the paraclinoid right ICA. Calcified atherosclerotic plaque within the cavernous and paraclinoid left ICA. Mild-to-moderate stenosis within the cavernous left ICA with otherwise no more than mild luminal narrowing. The right middle and anterior cerebral arteries are patent without significant proximal stenosis. The left middle and anterior cerebral arteries are patent without significant proximal stenosis. No intracranial aneurysm is identified. Posterior circulation: The intracranial vertebral arteries are patent without significant stenosis. The basilar artery is patent without significant stenosis. The right posterior cerebral artery is patent without significant proximal stenosis. The left posterior cerebral artery is patent. There are mild-to-moderate focal stenoses within the proximal and distal P2 left posterior cerebral artery. Venous sinuses: Within limitations of contrast timing, no convincing thrombus. Anatomic variants: None significant Review of the MIP images confirms the above findings IMPRESSION: CTA head: 1.  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 Confirmed severe stenosis within the cavernous right ICA. Additional sites of mild to moderate stenosis as described. 2. Mild-to-moderate focal stenoses within the proximal and distal P2 left posterior cerebral artery. CTA neck: Common carotid, internal carotid and vertebral arteries patent within the neck bilaterally without significant stenosis. Mild scattered atherosclerotic plaque within the bilateral carotid systems. No evidence of dissection or aneurysm. Electronically Signed   By: Kellie Simmering   On: 02/26/2019 09:34   Mr Angio Head Wo Contrast  Result Date: 02/26/2019 CLINICAL DATA:  Headache, hypertension and left arm weakness EXAM: MR HEAD WITHOUT CONTRAST MR CIRCLE OF WILLIS WITHOUT CONTRAST MRA OF THE NECK WITHOUT AND WITH CONTRAST TECHNIQUE: Multiplanar, multiecho pulse sequences of the brain, circle of willis and surrounding structures were obtained without intravenous contrast. Angiographic images of the neck were obtained using MRA technique without and with intravenous contrast. CONTRAST:  9.35mL GADAVIST GADOBUTROL 1 MMOL/ML IV SOLN COMPARISON:  None. FINDINGS: MR HEAD FINDINGS BRAIN: Multifocal acute ischemia within the right hemisphere, including along the precentral gyrus, postcentral gyrus, posterior right parietal lobe and right occipital lobe. There is no contralateral acute ischemia. There is petechial hemorrhage at the right parieto-occipital infarct site. There is mild cytotoxic edema at the ischemic sites. The white matter signal is normal for the patient's age. The cerebral and cerebellar volume are  age-appropriate. There is no hydrocephalus. The midline structures are normal. VASCULAR: The major intracranial arterial and venous sinus flow voids are normal. SKULL AND UPPER CERVICAL SPINE: Calvarial bone marrow signal is normal. There is no skull base mass. The visualized upper cervical spine and soft tissues are  normal. SINUSES/ORBITS: There are no fluid levels or advanced mucosal thickening. The mastoid air cells and middle ear cavities are free of fluid. The orbits are normal. MR CIRCLE OF WILLIS FINDINGS POSTERIOR CIRCULATION: --Vertebral arteries: Normal V4 segments. --Posterior inferior cerebellar arteries (PICA): Patent origins from the vertebral arteries. --Anterior inferior cerebellar arteries (AICA): Patent origins from the basilar artery. --Basilar artery: Normal. --Superior cerebellar arteries: Normal. --Posterior cerebral arteries: Normal. The right PCA is partially supplied by a small posterior communicating artery (p-comm). ANTERIOR CIRCULATION: --Intracranial internal carotid arteries: Severe stenosis of the distal right cavernous segment (series 7, image 90). Multifocal mild stenosis of the left cavernous and clinoid segments. --Anterior cerebral arteries (ACA): Normal. Both A1 segments are present. Patent anterior communicating artery (a-comm). --Middle cerebral arteries (MCA): Normal. MRA NECK FINDINGS Aortic arch: Normal 3 vessel aortic branching pattern. The visualized subclavian arteries are normal. Right carotid system: Normal course and caliber without stenosis or evidence of dissection. Left carotid system: Normal course and caliber without stenosis or evidence of dissection. Vertebral arteries: Codominant. Vertebral artery origins are normal. Vertebral arteries are normal in course and caliber to the vertebrobasilar confluence without stenosis or evidence of dissection. IMPRESSION: 1. Multifocal acute ischemia within the right hemisphere, including along the precentral gyrus, postcentral gyrus, posterior right parietal lobe and right occipital lobe. 2. Petechial hemorrhage at the right parieto-occipital infarct site. No mass effect. 3. 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 Multifocal mild stenosis of the left cavernous and clinoid segments. 4. Normal MRA of  the neck. No proximal embolic source is identified. Correlation with echocardiography may be helpful. Electronically Signed   By: Ulyses Jarred M.D.   On: 02/26/2019 03:01   Mr Angiogram Neck W Or Wo Contrast  Result Date: 02/26/2019 CLINICAL DATA:  Headache, hypertension and left arm weakness EXAM: MR HEAD WITHOUT CONTRAST MR CIRCLE OF WILLIS WITHOUT CONTRAST MRA OF THE NECK WITHOUT AND WITH CONTRAST TECHNIQUE: Multiplanar, multiecho pulse sequences of the brain, circle of willis and surrounding structures were obtained without intravenous contrast. Angiographic images of the neck were obtained using MRA technique without and with intravenous contrast. CONTRAST:  9.72mL GADAVIST GADOBUTROL 1 MMOL/ML IV SOLN COMPARISON:  None. FINDINGS: MR HEAD FINDINGS BRAIN: Multifocal acute ischemia within the right hemisphere, including along the precentral gyrus, postcentral gyrus, posterior right parietal lobe and right occipital lobe. There is no contralateral acute ischemia. There is petechial hemorrhage at the right parieto-occipital infarct site. There is mild cytotoxic edema at the ischemic sites. The white matter signal is normal for the patient's age. The cerebral and cerebellar volume are age-appropriate. There is no hydrocephalus. The midline structures are normal. VASCULAR: The major intracranial arterial and venous sinus flow voids are normal. SKULL AND UPPER CERVICAL SPINE: Calvarial bone marrow signal is normal. There is no skull base mass. The visualized upper cervical spine and soft tissues are normal. SINUSES/ORBITS: There are no fluid levels or advanced mucosal thickening. The mastoid air cells and middle ear cavities are free of fluid. The orbits are normal. MR CIRCLE OF WILLIS FINDINGS POSTERIOR CIRCULATION: --Vertebral arteries: Normal V4 segments. --Posterior inferior cerebellar arteries (PICA): Patent origins from the vertebral arteries. --Anterior inferior cerebellar arteries (AICA):  Patent origins  from the basilar artery. --Basilar artery: Normal. --Superior cerebellar arteries: Normal. --Posterior cerebral arteries: Normal. The right PCA is partially supplied by a small posterior communicating artery (p-comm). ANTERIOR CIRCULATION: --Intracranial internal carotid arteries: Severe stenosis of the distal right cavernous segment (series 7, image 90). Multifocal mild stenosis of the left cavernous and clinoid segments. --Anterior cerebral arteries (ACA): Normal. Both A1 segments are present. Patent anterior communicating artery (a-comm). --Middle cerebral arteries (MCA): Normal. MRA NECK FINDINGS Aortic arch: Normal 3 vessel aortic branching pattern. The visualized subclavian arteries are normal. Right carotid system: Normal course and caliber without stenosis or evidence of dissection. Left carotid system: Normal course and caliber without stenosis or evidence of dissection. Vertebral arteries: Codominant. Vertebral artery origins are normal. Vertebral arteries are normal in course and caliber to the vertebrobasilar confluence without stenosis or evidence of dissection. IMPRESSION: 1. Multifocal acute ischemia within the right hemisphere, including along the precentral gyrus, postcentral gyrus, posterior right parietal lobe and right occipital lobe. 2. Petechial hemorrhage at the right parieto-occipital infarct site. No mass effect. 3. 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 Multifocal mild stenosis of the left cavernous and clinoid segments. 4. Normal MRA of the neck. No proximal embolic source is identified. Correlation with echocardiography may be helpful. Electronically Signed   By: Ulyses Jarred M.D.   On: 02/26/2019 03:01   Mr Brain Wo Contrast  Result Date: 02/26/2019 CLINICAL DATA:  Headache, hypertension and left arm weakness EXAM: MR HEAD WITHOUT CONTRAST MR CIRCLE OF WILLIS WITHOUT CONTRAST MRA OF THE NECK WITHOUT AND WITH CONTRAST TECHNIQUE: Multiplanar,  multiecho pulse sequences of the brain, circle of willis and surrounding structures were obtained without intravenous contrast. Angiographic images of the neck were obtained using MRA technique without and with intravenous contrast. CONTRAST:  9.48mL GADAVIST GADOBUTROL 1 MMOL/ML IV SOLN COMPARISON:  None. FINDINGS: MR HEAD FINDINGS BRAIN: Multifocal acute ischemia within the right hemisphere, including along the precentral gyrus, postcentral gyrus, posterior right parietal lobe and right occipital lobe. There is no contralateral acute ischemia. There is petechial hemorrhage at the right parieto-occipital infarct site. There is mild cytotoxic edema at the ischemic sites. The white matter signal is normal for the patient's age. The cerebral and cerebellar volume are age-appropriate. There is no hydrocephalus. The midline structures are normal. VASCULAR: The major intracranial arterial and venous sinus flow voids are normal. SKULL AND UPPER CERVICAL SPINE: Calvarial bone marrow signal is normal. There is no skull base mass. The visualized upper cervical spine and soft tissues are normal. SINUSES/ORBITS: There are no fluid levels or advanced mucosal thickening. The mastoid air cells and middle ear cavities are free of fluid. The orbits are normal. MR CIRCLE OF WILLIS FINDINGS POSTERIOR CIRCULATION: --Vertebral arteries: Normal V4 segments. --Posterior inferior cerebellar arteries (PICA): Patent origins from the vertebral arteries. --Anterior inferior cerebellar arteries (AICA): Patent origins from the basilar artery. --Basilar artery: Normal. --Superior cerebellar arteries: Normal. --Posterior cerebral arteries: Normal. The right PCA is partially supplied by a small posterior communicating artery (p-comm). ANTERIOR CIRCULATION: --Intracranial internal carotid arteries: Severe stenosis of the distal right cavernous segment (series 7, image 90). Multifocal mild stenosis of the left cavernous and clinoid segments.  --Anterior cerebral arteries (ACA): Normal. Both A1 segments are present. Patent anterior communicating artery (a-comm). --Middle cerebral arteries (MCA): Normal. MRA NECK FINDINGS Aortic arch: Normal 3 vessel aortic branching pattern. The visualized subclavian arteries are normal. Right carotid system: Normal course and caliber without stenosis or  evidence of dissection. Left carotid system: Normal course and caliber without stenosis or evidence of dissection. Vertebral arteries: Codominant. Vertebral artery origins are normal. Vertebral arteries are normal in course and caliber to the vertebrobasilar confluence without stenosis or evidence of dissection. IMPRESSION: 1. Multifocal acute ischemia within the right hemisphere, including along the precentral gyrus, postcentral gyrus, posterior right parietal lobe and right occipital lobe. 2. Petechial hemorrhage at the right parieto-occipital infarct site. No mass effect. 3. 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 Multifocal mild stenosis of the left cavernous and clinoid segments. 4. Normal MRA of the neck. No proximal embolic source is identified. Correlation with echocardiography may be helpful. Electronically Signed   By: Ulyses Jarred M.D.   On: 02/26/2019 03:01    Pending Labs Unresulted Labs (From admission, onward)    Start     Ordered   03/05/19 0500  Creatinine, serum  (enoxaparin (LOVENOX)    CrCl >/= 30 ml/min)  Weekly,   R    Comments: while on enoxaparin therapy    02/26/19 0407   02/26/19 1452  TSH  Add-on,   AD     02/26/19 1451   02/26/19 0405  HIV4GL Save Tube  (HIV Antibody (Routine testing w reflex) panel)  Once,   STAT     02/26/19 0407          Vitals/Pain Today's Vitals   02/26/19 0730 02/26/19 0800 02/26/19 1130 02/26/19 1200  BP: (!) 143/80 137/87 (!) 135/97 135/68  Pulse:  76 66 66  Resp: 14 17 12 18   Temp:      TempSrc:   Oral   SpO2:  98% 100%   PainSc:        Isolation  Precautions No active isolations  Medications Medications  sodium chloride flush (NS) 0.9 % injection 3 mL (3 mLs Intravenous Not Given 02/25/19 2324)   stroke: mapping our early stages of recovery book (0 each Does not apply Hold 02/26/19 0444)  0.9 %  sodium chloride infusion ( Intravenous New Bag/Given 02/26/19 0420)  acetaminophen (TYLENOL) tablet 650 mg (has no administration in time range)    Or  acetaminophen (TYLENOL) solution 650 mg (has no administration in time range)    Or  acetaminophen (TYLENOL) suppository 650 mg (has no administration in time range)  enoxaparin (LOVENOX) injection 40 mg (has no administration in time range)  LORazepam (ATIVAN) tablet 1-4 mg (has no administration in time range)    Or  LORazepam (ATIVAN) injection 1-4 mg (has no administration in time range)  thiamine (VITAMIN B-1) tablet 100 mg (100 mg Oral Given 02/26/19 1021)    Or  thiamine (B-1) injection 100 mg ( Intravenous See Alternative [id number])  folic acid (FOLVITE) tablet 1 mg (1 mg Oral Given 02/26/19 1022)  multivitamin with minerals tablet 1 tablet (1 tablet Oral Given 02/26/19 1022)  hydrALAZINE (APRESOLINE) injection 10 mg (has no administration in time range)  aspirin EC tablet 325 mg (325 mg Oral Given 02/26/19 1027)  atorvastatin (LIPITOR) tablet 80 mg (has no administration in time range)  albuterol (PROVENTIL) (2.5 MG/3ML) 0.083% nebulizer solution 2.5 mg (has no administration in time range)  gadobutrol (GADAVIST) 1 MMOL/ML injection 9.5 mL (9.5 mLs Intravenous Contrast Given 02/26/19 0234)  iohexol (OMNIPAQUE) 350 MG/ML injection 75 mL (75 mLs Intravenous Contrast Given 02/26/19 0837)    Mobility walks Low fall risk   Focused Assessments    R Recommendations: See Admitting Provider Note  Report  given to: Anderson Malta, RN  Additional Notes:

## 2019-03-02 ENCOUNTER — Other Ambulatory Visit: Payer: Self-pay

## 2019-03-02 ENCOUNTER — Encounter: Payer: Self-pay | Admitting: Physical Therapy

## 2019-03-02 ENCOUNTER — Encounter: Payer: Self-pay | Admitting: Occupational Therapy

## 2019-03-02 ENCOUNTER — Ambulatory Visit: Payer: Medicaid Other | Admitting: Occupational Therapy

## 2019-03-02 ENCOUNTER — Ambulatory Visit: Payer: Medicaid Other | Attending: Internal Medicine | Admitting: Physical Therapy

## 2019-03-02 VITALS — BP 134/87 | HR 65

## 2019-03-02 DIAGNOSIS — R41842 Visuospatial deficit: Secondary | ICD-10-CM

## 2019-03-02 DIAGNOSIS — R41840 Attention and concentration deficit: Secondary | ICD-10-CM | POA: Diagnosis present

## 2019-03-02 DIAGNOSIS — R278 Other lack of coordination: Secondary | ICD-10-CM | POA: Diagnosis present

## 2019-03-02 DIAGNOSIS — R414 Neurologic neglect syndrome: Secondary | ICD-10-CM | POA: Diagnosis present

## 2019-03-02 DIAGNOSIS — I69354 Hemiplegia and hemiparesis following cerebral infarction affecting left non-dominant side: Secondary | ICD-10-CM | POA: Insufficient documentation

## 2019-03-02 DIAGNOSIS — R42 Dizziness and giddiness: Secondary | ICD-10-CM

## 2019-03-02 DIAGNOSIS — R2689 Other abnormalities of gait and mobility: Secondary | ICD-10-CM

## 2019-03-02 DIAGNOSIS — R2681 Unsteadiness on feet: Secondary | ICD-10-CM

## 2019-03-02 DIAGNOSIS — M6281 Muscle weakness (generalized): Secondary | ICD-10-CM | POA: Insufficient documentation

## 2019-03-02 NOTE — Therapy (Signed)
[facility] [address] [place], [place], [address] Phone: [phone]   Fax:  [phone]  Occupational Therapy Evaluation  Patient Details  Name: Frank Fischer MRN: [id number] Date of Birth: 05-Jul-1960 Referring Provider (OT): Dr. Eulogio Bear   Encounter Date: 03/02/2019  OT End of Session - 03/02/19 1603    Visit Number  1    Number of Visits  18    Date for OT Re-Evaluation  05/08/19    Authorization Type  Medicaid--awaiting authorization (requested 12 initial visits)    OT Start Time  [id number]    OT Stop Time  1145    OT Time Calculation (min)  40 min    Activity Tolerance  Patient tolerated treatment well    Behavior During Therapy  [facility] for tasks assessed/performed       Past Medical History:  Diagnosis Date  . Arthritis   . Cervical stenosis of spine   . Chronic headaches   . Dental caries    periodontal disease  . Frequency of urination   . History of cerebral infarction 11-28-2017  per pt never had symptoms   per MRI imaging 10-31-2016 2 small chronic lacunar infartions in the right frontal periventricular white matter , and chronic left medial orbital fracture (as seen 2014 MRI)  . Hypertension   . Noncompliance with medication regimen   . Prostate cancer [facility]) urologist-  dr winter/  oncologist-  dr Tammi Klippel   dx 07-21-2017--- Stage T1c, Gleason 4+3,  PSA 13.40,  vol 49.7cc--  scheduled for radioactive seed implants 12-03-2017    Past Surgical History:  Procedure Laterality Date  . ABDOMINAL EXPLORATION SURGERY  age 59   per pt had Small Bowel Resection and Appendectomy  . APPENDECTOMY    . ARM SURGERY Left 1999   REPAIR INJURY-- "ARM CUT ABOUT OFF"  . PROSTATE BIOPSY  07/21/2017  dr winter office  . RADIOACTIVE SEED IMPLANT N/A 12/03/2017   Procedure: RADIOACTIVE SEED IMPLANT/BRACHYTHERAPY IMPLANT;  Surgeon: Ceasar Mons, MD;  Location: [facility];  Service:  Urology;  Laterality: N/A;  ONLY NEEDS 120 MIN  . SPACE OAR INSTILLATION N/A 12/03/2017   Procedure: SPACE OAR INSTILLATION;  Surgeon: Ceasar Mons, MD;  Location: [facility];  Service: Urology;  Laterality: N/A;  . TOOTH EXTRACTION N/A 09/21/2018   Procedure: DENTAL EXTRACTIONS WITH ALVEOLIPLASTY;  Surgeon: Diona Browner, DDS;  Location: [place];  Service: Oral Surgery;  Laterality: N/A;    There were no vitals filed for this visit.  Subjective Assessment - 03/02/19 1113    Subjective   Pt reports that L arm is "floppy"    Patient is accompanied by:  Family member   wife--[name]   Patient Stated Goals  improve vision    Currently in Pain?  No/denies        [facility] OT Assessment - 03/02/19 1113      Assessment   Medical Diagnosis  R CVA    Referring Provider (OT)  Dr. Eulogio Bear    Onset Date/Surgical Date  02/25/19    Hand Dominance  Right    Prior Therapy  acute OT eval      Precautions   Precautions  Fall    Precaution Comments  no driving, L visual field deficit/L inattention      Balance Screen   Has the patient fallen in the past 6 months  No      Home  Environment  Family/patient expects to be discharged to:  Private residence    Living Arrangements  Spouse/significant other    Lives With  Spouse      Prior Function   Level of [place]  On disability   for past 6 years (due to vertigo, HTN, ?unsure)   Leisure  likes to [employer]      ADL   Eating/Feeding  Needs assist with cutting food   not using LUE to open bottle   Grooming  Modified independent    Upper Body Bathing  Modified independent    Lower Body Bathing  Modified independent    Upper Body Dressing  --   mod I   Lower Body Dressing  --   assist for donning socks   Lower Body Dressing Patient Percentage  --   can't use LUE to pull up underwear   Toilet Transfer  Modified independent    Toileting - Clothing Manipulation  --   difficulty tying  pants   Toileting -  Hygiene  Modified Independent    Tub/Shower Transfer  Modified independent      IADL   Prior Level of Function Shopping  pt/wife perform together    Prior Level of Function Light Housekeeping  wife performed prior    Prior Level of Function Meal Prep  pt/wife both performed, pt is not performing now    Prior Level of Function IT consultant on family or friends for transportation    Prior Level of Function Financial Management  wife performed      Mobility   Mobility Status  Independent   see PT eval for details   Mobility Status Comments  bumping into things on L side at home      Written Expression   Dominant Hand  Right      Vision - History   Baseline Vision  Wears glasses only for reading    Additional Comments  "missing items on L side"  "I really have to focus"      Vision Assessment   Tracking/Visual Pursuits  --   difficulty tracking to the L, but able to do so with cues   Visual Fields  Left visual field deficit    Comment  Per pt/wife, pt missing things/bumping into things on L side at home.  Tabletop visual scanning/number cancellation with approx 45% accuracy (none found L of midline with some missed on R side as well) and no organized scanning pattern used      Cognition   Overall Cognitive Status  Impaired/Different from baseline   will assess further in functional context prn   Attention  --   decr attention to L side   Awareness  Impaired    Awareness Impairment  Anticipatory impairment   decr, not compensating for visual deficits   Cognition Comments  decr compensation noted for L visual field deficit      Sensation   Additional Comments  Pt reports mild numbness/tingling in fingertips      Coordination   9 Hole Peg Test  Right;Left    Right 9 Hole Peg Test  27.78   missed L 3 holes initially   Left 9 Hole Peg Test  77.53sec (picked up more than 1 at a time)   significant shoulder  compensation, gross grasp     Perception   Perception  Impaired    Inattention/Neglect  Does not attend to  left visual field      ROM / Strength   AROM / PROM / Strength  AROM;Strength      AROM   Overall AROM   Deficits    Overall AROM Comments  LUE grossly WFL, but noted decr control with movement, and only approx 90% shoulder AROM      Strength   Overall Strength  Deficits    Overall Strength Comments  LUE shoulder strength grossly 3+to 4-/5, biceps/triceps 4 to 4+/5      Hand Function   Right Hand Grip (lbs)  89    Left Hand Grip (lbs)  20.5                      OT Education - 03/02/19 1601    Education Details  OT eval results/POC; Pt/wife instructed that pt should have someone with him for safety in parking lot/in community and no kitchen tasks with anything hot, sharp, breakable, heavy for safety due to decr LUE control, visual field deficit, and decr attention to L side    Person(s) Educated  Patient;Spouse    Methods  Explanation    Comprehension  Verbalized understanding       OT Short Term Goals - 03/02/19 1613      OT SHORT TERM GOAL #1   Title  Pt will be independent with HEP for LUE coordination/strength.--check STGs 04/01/19    Baseline  no HEP/dependent    Time  4    Period  Weeks    Status  New      OT SHORT TERM GOAL #2   Title  Pt will improve LUE coordination for ADLs as shown by improving time on 9-hole peg test to 60sec or less.    Baseline  77sec    Time  4    Period  Weeks    Status  New      OT SHORT TERM GOAL #3   Title  Pt will improve L grip strength by at least 8lbs to assist with opening containers.    Baseline  20.5    Time  4    Period  Weeks    Status  New      OT SHORT TERM GOAL #4   Title  Pt will be able to use LUE as nondominant assist consistently for BADLs (including pulling up underwear and donning socks).    Baseline  unable to pull up underwear with LUE, unable to cut food with fork/knife, and unable to  don socks    Time  4    Period  Weeks    Status  New      OT SHORT TERM GOAL #5   Title  Pt will perform simple tabletop visual scanning with at least 75% accuracy.    Baseline  45%, did not scan to L of midline    Time  4    Period  Weeks    Status  New        OT Long Term Goals - 03/02/19 1620      OT LONG TERM GOAL #1   Title  Pt will be indepenent with compensation strategies for visual deficits and visual HEP.--check LTGs 05/08/19    Baseline  not scanning to L of midline with reading, bumping into items on L side, no visual HEP/dependent    Time  8    Period  Weeks    Status  New      OT  LONG TERM GOAL #2   Title  Pt will improve LUE coordination for ADLs as shown by improving time on 9-hole peg test to 45sec or less.    Baseline  77sec    Time  8    Period  Weeks    Status  New      OT LONG TERM GOAL #3   Title  Pt will improve L grip strength by at least 16bs to assist with opening containers.    Baseline  20.5lbs    Time  8    Period  Weeks    Status  New      OT LONG TERM GOAL #4   Title  Pt will be able to retrieve 3lb object from overhead shelf with LUE demonstrating good control/safety.    Baseline  decr control/safety, not using LUE for functional reach    Time  8    Period  Weeks    Status  New      OT LONG TERM GOAL #5   Title  Pt will perform simple tabletop visual scanning with at least 90% accuracy.    Baseline  approx 45% accuracy    Time  8    Period  Weeks    Status  New      OT LONG TERM GOAL #6   Title  Pt will perform simple environmental scanning with at least 80% accuracy for incr safety in home and community.    Baseline  frequently bumping into things on L side within the home, only 45% visual scanning tabletop level, pt did not look to L unless cued    Time  8    Period  Weeks    Status  New      OT LONG TERM GOAL #7   Title  Pt will be able to complete simple cooking task with supervision.    Baseline  not performing, decr  safety due to decr LUE functional use and visual field deficit    Time  8    Period  Weeks    Status  New            Plan - 03/02/19 1605    Clinical Impression Statement  Pt is a 58 y.o. male s/p CVA 02/25/19 (hospitalized 02/25/19-02/26/19).  Pt with PMH that includes:  HTN, hypertension, polysubstance abuse including cocaine alcohol and tobacco,hx of prostate CA, chronic headaches, cervical stenosis of spine, arthritis.  Pt was on disability, but independent and driving prior to CVA.  Pt enjoys cooking, but is currently unable to drive or cook.  Pt presents today with L hemiparesis with decr coordination and strength, decr LUE functional use, cognitive deficits, visual field deficits, L inattention/neglect, decr balance.  Pt would benefit from occupational therapy for incr ADL/IADL safety/performance and incr LUE functional use.    OT Occupational Profile and History  Detailed Assessment- Review of Records and additional review of physical, cognitive, psychosocial history related to current functional performance    Occupational performance deficits (Please refer to evaluation for details):  ADL's;IADL's;Leisure;Social Participation    Body Structure / Function / Physical Skills  ADL;Dexterity;ROM;Vision;IADL;Balance;Sensation;Strength;FMC;Coordination;UE functional use;GMC;Decreased knowledge of use of DME    Cognitive Skills  Attention;Perception    Rehab Potential  Good    Clinical Decision Making  Several treatment options, min-mod task modification necessary    Comorbidities Affecting Occupational Performance:  May have comorbidities impacting occupational performance    Modification or Assistance to Complete Evaluation   Min-Moderate modification  of tasks or assist with assess necessary to complete eval    OT Frequency  2x / week    OT Duration  --   9 weeks +eval (or 17 visits +eval)   OT Treatment/Interventions  Self-care/ADL training;Moist Heat;DME and/or AE instruction;Balance  training;Therapeutic activities;Therapeutic exercise;Cognitive remediation/compensation;Visual/perceptual remediation/compensation;Passive range of motion;Functional Mobility Training;Neuromuscular education;Cryotherapy;Energy conservation;Manual Therapy;Patient/family education    Plan  initiate HEP for LUE coordination, visual HEP/compensation strategies as able    Consulted and Agree with Plan of Care  Patient;Family member/caregiver    Family Member Consulted  wife--[name]       Patient will benefit from skilled therapeutic intervention in order to improve the following deficits and impairments:   Body Structure / Function / Physical Skills: ADL, Dexterity, ROM, Vision, IADL, Balance, Sensation, Strength, FMC, Coordination, UE functional use, GMC, Decreased knowledge of use of DME Cognitive Skills: Attention, Perception     Visit Diagnosis: Hemiplegia and hemiparesis following cerebral infarction affecting left non-dominant side (HCC)  Unsteadiness on feet  Other abnormalities of gait and mobility  Visuospatial deficit  Attention and concentration deficit  Other lack of coordination  Neurologic neglect syndrome    Problem List Patient Active Problem List   Diagnosis Date Noted  . Acute CVA (cerebrovascular accident) ([place]) 02/26/2019  . Polysubstance abuse ([place]) 02/26/2019  . Malignant neoplasm of prostate ([place]) 10/02/2017  . Noncompliance with medication regimen 12/18/2016  . Chronic tension type headache 10/20/2016  . Headache(784.0) 10/19/2013  . Itching 10/19/2013  . Smoker 10/19/2013  . Essential hypertension, benign 07/08/2013  . Pain in joint, shoulder region 07/08/2013  . Cervical disc disorder with radiculopathy of cervical region 07/08/2013  . Dental caries 07/08/2013  . Spinal stenosis in cervical region 07/07/2013  . Tendinitis of left rotator cuff 07/07/2013    [facility] 03/02/2019, 4:37 PM  [place] [facility] [address] [place], [place], [address] Phone: [phone]   Fax:  [phone]  Name: Frank Fischer MRN: [id number] Date of Birth: [birth year]   Vianne Bulls, OTR/L [facility] [address] [place], [place]  [address] [phone] phone [phone] 03/02/19 4:37 PM

## 2019-03-02 NOTE — Therapy (Signed)
[facility] [address] [place], [place], [address] Phone: [phone]   Fax:  [phone]  Physical Therapy Evaluation  Patient Details  Name: Frank Fischer MRN: [id number] Date of Birth: December 15, 1960 Referring Provider (PT): Eulogio Bear, DO   Encounter Date: 03/02/2019  PT End of Session - 03/02/19 1416    Visit Number  1    Number of Visits  4    Authorization Type  Medicaid    Authorization Time Period  waiting for auth    PT Start Time  1015    PT Stop Time  1058    PT Time Calculation (min)  43 min    Equipment Utilized During Treatment  Gait belt    Activity Tolerance  Patient tolerated treatment well    Behavior During Therapy  [facility] for tasks assessed/performed       Past Medical History:  Diagnosis Date  . Arthritis   . Cervical stenosis of spine   . Chronic headaches   . Dental caries    periodontal disease  . Frequency of urination   . History of cerebral infarction 11-28-2017  per pt never had symptoms   per MRI imaging 10-31-2016 2 small chronic lacunar infartions in the right frontal periventricular white matter , and chronic left medial orbital fracture (as seen 2014 MRI)  . Hypertension   . Noncompliance with medication regimen   . Prostate cancer [facility]) urologist-  dr winter/  oncologist-  dr Tammi Klippel   dx 07-21-2017--- Stage T1c, Gleason 4+3,  PSA 13.40,  vol 49.7cc--  scheduled for radioactive seed implants 12-03-2017    Past Surgical History:  Procedure Laterality Date  . ABDOMINAL EXPLORATION SURGERY  age 14   per pt had Small Bowel Resection and Appendectomy  . APPENDECTOMY    . ARM SURGERY Left 1999   REPAIR INJURY-- "ARM CUT ABOUT OFF"  . PROSTATE BIOPSY  07/21/2017  dr winter office  . RADIOACTIVE SEED IMPLANT N/A 12/03/2017   Procedure: RADIOACTIVE SEED IMPLANT/BRACHYTHERAPY IMPLANT;  Surgeon: Ceasar Mons, MD;  Location: [facility];  Service:  Urology;  Laterality: N/A;  ONLY NEEDS 120 MIN  . SPACE OAR INSTILLATION N/A 12/03/2017   Procedure: SPACE OAR INSTILLATION;  Surgeon: Ceasar Mons, MD;  Location: [facility];  Service: Urology;  Laterality: N/A;  . TOOTH EXTRACTION N/A 09/21/2018   Procedure: DENTAL EXTRACTIONS WITH ALVEOLIPLASTY;  Surgeon: Diona Browner, DDS;  Location: [facility];  Service: Oral Surgery;  Laterality: N/A;    Vitals:   03/02/19 1020  BP: 134/87  Pulse: 65     Subjective Assessment - 03/02/19 1022    Subjective  Pt states that he had been having off-and-on headaches for 3 weeks.  Hospitalized 02/26/19 for LUE weakness. 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 Primary complaint is LUE weakness and numbness - has trouble grasping objects. States that his leg strength and walking feels the same as it did before his stroke. Per pt's wife notices that pt has difficulty lifting up LLE during gait.    Patient is accompained by:  Family member   wife, Frank Fischer   Pertinent History  HTN, polysubstance abuse, tobacco abuse, and prostate cancer.    Diagnostic tests  MRI revelaed Infarcts in R precentral and postcentral gyrus, R parietal lobe, and R occipital lobe.    Patient Stated Goals  wants to get back to normal  Currently in Pain?  No/denies         [facility] PT Assessment - 03/02/19 1025      Assessment   Medical Diagnosis  R CVA    Referring Provider (PT)  Eulogio Bear, DO    Hand Dominance  Right      Precautions   Precautions  Fall      Balance Screen   Has the patient fallen in the past 6 months  No    Has the patient had a decrease in activity level because of a fear of falling?   No    Is the patient reluctant to leave their home because of a fear of falling?   No      Home Film/video editor residence    Living Arrangements  Spouse/significant other    Available Help at Discharge  Family     Type of [place]   [place] to enter    Entrance Stairs-Number of Steps  3    Entrance Stairs-Rails  Can reach both    [place]  Two level    Alternate Level Stairs-Number of Steps  15    Alternate Level Stairs-Rails  Can reach both    [place]  None      Prior Function   Level of [place]  On disability   past 6 years    Leisure  "I don't like to do nothing"      Sensation   Light Touch  Appears Intact      Coordination   Gross Motor Movements are Fluid and Coordinated  Yes      ROM / Strength   AROM / PROM / Strength  Strength      Strength   Strength Assessment Site  Hip;Knee;Ankle    Right/Left Hip  Right;Left    Right Hip Flexion  4/5    Left Hip Flexion  3+/5    Right/Left Knee  Right;Left    Right Knee Flexion  5/5    Right Knee Extension  4+/5    Left Knee Flexion  4/5    Left Knee Extension  4+/5    Right/Left Ankle  Right;Left    Right Ankle Dorsiflexion  5/5    Left Ankle Dorsiflexion  3+/5      Transfers   Five time sit to stand comments   16 seconds   from low blue mat table with no UE support     Ambulation/Gait   Ambulation/Gait  Yes    Ambulation/Gait Assistance  4: Min assist;5: Supervision    Ambulation/Gait Assistance Details  3 incidences of min A for pt to keep his balance required during gait today from therapist due to pt tripping over his L foot due to impaired LLE foot clearance.     Ambulation Distance (Feet)  150 Feet    Assistive device  None    Gait Pattern  Step-through pattern;Decreased step length - right;Decreased arm swing - left;Decreased stance time - left;Decreased dorsiflexion - left;Left foot flat;Wide base of support;Poor foot clearance - left    Ambulation Surface  Level;Indoor    Gait velocity  13.38 seconds = 2.45 ft/sec    Stairs  Yes    Stairs Assistance  5: Supervision    Gait Comments  Pt reporting 4/10 dizziness after performing vertical  head nods during FGA testing today. When asking  further, pt states that this has been a constant dizziness for the past couple of weeks.      Standardized Balance Assessment   Standardized Balance Assessment  Berg Balance Test      Berg Balance Test   Sit to Stand  Able to stand without using hands and stabilize independently    Standing Unsupported  Able to stand safely 2 minutes    Sitting with Back Unsupported but Feet Supported on Floor or Stool  Able to sit safely and securely 2 minutes    Stand to Sit  Sits safely with minimal use of hands    Transfers  Able to transfer safely, minor use of hands    Standing Unsupported with Eyes Closed  Able to stand 10 seconds safely    Standing Unsupported with Feet Together  Able to place feet together independently and stand 1 minute safely    From Standing, Reach Forward with Outstretched Arm  Can reach confidently >25 cm (10")    From Standing Position, Pick up Object from Floor  Able to pick up shoe safely and easily    From Standing Position, Turn to Look Behind Over each Shoulder  Looks behind one side only/other side shows less weight shift    Turn 360 Degrees  Able to turn 360 degrees safely but slowly    Standing Unsupported, Alternately Place Feet on Step/Stool  Able to stand independently and safely and complete 8 steps in 20 seconds    Standing Unsupported, One Foot in Front  Able to plae foot ahead of the other independently and hold 30 seconds    Standing on One Leg  Tries to lift leg/unable to hold 3 seconds but remains standing independently    Total Score  49    Berg comment:  49/56      Functional Gait  Assessment   Gait assessed   Yes    Gait Level Surface  Walks 20 ft, slow speed, abnormal gait pattern, evidence for imbalance or deviates 10-15 in outside of the 12 in walkway width. Requires more than 7 sec to ambulate 20 ft.   7.27 seconds   Change in Gait Speed  Makes only minor adjustments to walking speed, or accomplishes a  change in speed with significant gait deviations, deviates 10-15 in outside the 12 in walkway width, or changes speed but loses balance but is able to recover and continue walking.    Gait with Horizontal Head Turns  Performs head turns smoothly with slight change in gait velocity (eg, minor disruption to smooth gait path), deviates 6-10 in outside 12 in walkway width, or uses an assistive device.    Gait with Vertical Head Turns  Performs task with moderate change in gait velocity, slows down, deviates 10-15 in outside 12 in walkway width but recovers, can continue to walk.    Gait and Pivot Turn  Pivot turns safely in greater than 3 sec and stops with no loss of balance, or pivot turns safely within 3 sec and stops with mild imbalance, requires small steps to catch balance.    Step Over Obstacle  Is able to step over one shoe box (4.5 in total height) but must slow down and adjust steps to clear box safely. May require verbal cueing.    Gait with Narrow Base of Support  Ambulates less than 4 steps heel to toe or cannot perform without assistance.    Gait with Eyes Closed  Walks 20 ft, slow speed, abnormal  gait pattern, evidence for imbalance, deviates 10-15 in outside 12 in walkway width. Requires more than 9 sec to ambulate 20 ft.   15.38 seconds    Ambulating Backwards  Walks 20 ft, uses assistive device, slower speed, mild gait deviations, deviates 6-10 in outside 12 in walkway width.    Steps  Alternating feet, must use rail.    Total Score  13    FGA comment:  13/30                Objective measurements completed on examination: See above findings.              PT Education - 03/02/19 1421    Education Details  clinical findings, POC    Person(s) Educated  Patient;Spouse    Methods  Explanation    Comprehension  Verbalized understanding       PT Short Term Goals - 03/03/19 1035      PT SHORT TERM GOAL #1   Title  Patient will be independent with initial HEP in  order to build upon functional gains made in therapy. ALL STGS DUE AFTER 3 VISITS.    Baseline  dependent    Status  New      PT SHORT TERM GOAL #2   Title  Patient will improve FGA score to at least a 16/30 in order to demonstrate decreased fall risk.    Baseline  13/30 on 03/02/19    Status  New      PT SHORT TERM GOAL #3   Title  Patient's gait speed will improve to at least 2.70 ft/sec in order to demonstrate improved community ambulation.    Baseline  2.45 ft/sec on 03/02/19    Status  New      PT SHORT TERM GOAL #4   Title  Patient will ambulate at least 230' without AD and supervision without any episodes of tripping over/scuffing his L foot in order to demonstrate improved safety while ambulating.    Baseline  3 incidences on min A on 03/02/19 in order for pt to maintain balance.    Status  New        PT Long Term Goals - 03/03/19 1039      PT LONG TERM GOAL #1   Title  Patient will be independent with final HEP in order to build upon functional gains made in therapy. ALL LTGS DUE AFTER 9 VISITS.    Baseline  dependent with HEP    Status  New      PT LONG TERM GOAL #2   Title  Patient will improve FGA score to at least a 20/30 in order to demonstrate decreased fall risk.    Baseline  13/20 on 03/02/19    Status  New      PT LONG TERM GOAL #3   Title  Patient will report 2/10 or less dizzinss after performing gait while scanning environment up/down in order to improve safety during ambulation.    Baseline  currently 4/10 dizziness after performing head nods on FGA      PT LONG TERM GOAL #4   Title  Patient will improve BERG score to at least a 53/56 in order to demonstrate decreased fall risk.    Baseline  49/56    Status  New      PT LONG TERM GOAL #5   Title  Patient will perform 15 steps with step through pattern using single vs. B handrail and mod I in order  to safely ascend/descend stairs in his home.    Baseline  not yet assessed    Status  New       Additional Long Term Goals   Additional Long Term Goals  Yes      PT LONG TERM GOAL #6   Title  Patient will improve gait speed to at least 3.4 ft/sec  in order to demo improved community ambulation.    Baseline  2.45 ft/sec on 03/02/19    Status  New             Plan - 03/02/19 1421    Clinical Impression Statement  Patient is a 58 year old male referred to Neuro OPPT for evaluation s/p R CVA. 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 Pt's PMH is significant for: HTN, polysubstance abuse, tobacco abuse, and prostate cancer.   The following deficits were present during the exam: impaired dynamic and static balance, gait abnormalities (decreased LLE foot clearance, L foot flat), dizziness (especially during gait with head turns), decreased LE strength, decreased safety awareness. Pt's FGA scores indicate pt is at a high risk for falls and pt's gait speed indicates limited community ambulator.  Pt would benefit from skilled PT to address these impairments and functional limitations to maximize functional mobility independence.    Personal Factors and Comorbidities  Past/Current Experience;Behavior Pattern;Comorbidity 3+    Comorbidities  HTN, polysubstance abuse, tobacco abuse, and prostate cancer.    Examination-Activity Limitations  Locomotion Level;Stairs    Examination-Participation Restrictions  Community Activity;Yard Work    Stability/Clinical Decision Making  Stable/Uncomplicated    Designer, jewellery  Low    Rehab Potential  Good    PT Frequency  1x / week   followed by 2x week for 3 weeks   PT Duration  4 weeks   followed by 2x week for 3 weeks   PT Treatment/Interventions  ADLs/Self Care Home Management;Electrical Stimulation;Gait training;Stair training;Functional mobility training;Balance training;Therapeutic exercise;Therapeutic activities;Neuromuscular re-education;Patient/family education;Orthotic  Fit/Training;Vestibular    PT Next Visit Plan  initial HEP for balance/LE strengthening. Vestibular assesssment when appropriate.    Consulted and Agree with Plan of Care  Patient;Family member/caregiver    Family Member Consulted  pt's wife Frank Fischer       Patient will benefit from skilled therapeutic intervention in order to improve the following deficits and impairments:  Abnormal gait, Decreased balance, Decreased safety awareness, Decreased strength, Difficulty walking, Impaired vision/preception, Dizziness  Visit Diagnosis: Unsteadiness on feet  Muscle weakness (generalized)  Hemiplegia and hemiparesis following cerebral infarction affecting left non-dominant side (HCC)  Dizziness and giddiness  Other abnormalities of gait and mobility     Problem List Patient Active Problem List   Diagnosis Date Noted  . Acute CVA (cerebrovascular accident) ([place]) 02/26/2019  . Polysubstance abuse ([place]) 02/26/2019  . Malignant neoplasm of prostate ([place]) 10/02/2017  . Noncompliance with medication regimen 12/18/2016  . Chronic tension type headache 10/20/2016  . Headache(784.0) 10/19/2013  . Itching 10/19/2013  . Smoker 10/19/2013  . Essential hypertension, benign 07/08/2013  . Pain in joint, shoulder region 07/08/2013  . Cervical disc disorder with radiculopathy of cervical region 07/08/2013  . Dental caries 07/08/2013  . Spinal stenosis in cervical region 07/07/2013  . Tendinitis of left rotator cuff 07/07/2013    Arliss Journey, PT, DPT  03/03/2019, 10:47 AM  [facility] [address] [place], [place], [address] Phone: [phone]  Fax:  [phone]  Name: Frank Fischer MRN: [id number] Date of Birth: 28-Nov-1960

## 2019-03-04 ENCOUNTER — Encounter: Payer: Self-pay | Admitting: *Deleted

## 2019-03-04 ENCOUNTER — Other Ambulatory Visit: Payer: Self-pay | Admitting: *Deleted

## 2019-03-04 NOTE — Patient Outreach (Signed)
[facility]) Care Management  03/04/2019  Frank Fischer July 25, 1960 [id number]   EMMI- stroke  RED ON EMMI ALERT Day # 3 Date: Tuesday 03/02/19 1003  Red Alert Reason: Who reached Patient Feeling worse overall? Yes Listened to these previous topics Medications Insurance:  Medicaid [place] access [facility] admissions x  1 ED visits x1  in the last 6 months Admit date: 02/25/2019 Discharge date: 02/26/2019   Outreach attempt # 1 successful to the home/mobile number  Patient is able to verify HIPAA, DOB and address [facility] Care Management RN reviewed and addressed red alert with patient He gives permission 1015/20 for any [facility] staff to speak with Frank Fischer on his behalf as needed  EMMI:  Frank Fischer reports that the EMMI automated system may have interpreted he answer incorrectly He reports he is feeling good and denies any issues with feeling worse over all He reports he has all his medications and is taking them as ordered.  [facility] RN CM inquired about his use of the ASA, Lipitor, plavix and lisinopril listed on his after summary hospital forms. He denies need of DME He confirms he is attending his neuro rehab tx sessions During the call with CM there was noted some background noise that may interfere with this patient's EMMI answer Pt frequent stating he was doing good with all questions in a rushed tone but CM identified need for advance directives and smoke cessation during the all  He does agree to have [facility] SW contact for advance directives and smoke cessation plus [facility] RN CM follow up within 7-14 business days  Some Transition of care questions reviewed during this call   Social: Frank Fischer is a 58 year old male who is single living at home alone in his apartment. He voices his support system is his fiance, Frank Fischer and brother Frank Fischer. He denies issues with transportation to medical appointments. He is independent in his care needs. H has 2 children  and completed 12 years of education    Conditions: Acute CVA, HTN, polysubstance abuse including cocaine alcohol and tobacco, chronic tension headache, malignant neoplasm of prostate, HLD, hx of previous CVA  DME: none  Medications: He denies concerns with taking medications as prescribed, affording medications, side effects of medications and questions about medications  Appointments: 03/09/19 1530 neuro PT tx [facility] 03/18/19 0930 neuro PT tx [facility] 03/23/19 1315 neuro OT tx [facility] & PT at 1445 03/26/19 1100 neuro OT tx [facility] 03/30/19 1315 neuro OT tx [facility] 04/01/19 1445 neuro OT tx [facility] 04/06/19 1315 neuro OT tx [facility] 04/09/19 1230 neuro OT tx [facility] 04/13/19 1230 neuro OT tx [facility] 04/16/19 1230 neuro OT tx [facility] 04/20/19 1315 neuro OT tx [facility] 04/23/19 1230 neuro OT tx [facility] 04/27/19 1315 neuro OT tx [facility] 05/04/19 1315 neuro OT tx [facility] 05/07/19 1230 neuro OT tx [facility] 05/11/19 1230 neuro OT tx [facility] 05/18/19 1315  neuro OT tx [facility]  Advance Directives: Does not have advance directives but is interested in services. Agrees to a [facility] SW referral for assistance    Consent: [facility] RN CM reviewed [facility] services with patient. Patient gave verbal consent for services [facility] telephonic RN CM and [place] patient that there will be further automated EMMI- post discharge calls to assess how the patient is doing following the recent hospitalization Advised the patient that another call may be received from a nurse if any of their responses were abnormal. Patient voiced understanding  and was appreciative of f/u call.   Plan: [facility] RN CM will follow up with Frank Fischer within 7-14 business days for further assessment of needs  [facility] RN CM will refer Frank Fischer to [facility] SW for assistance with advance directives and smoke cessation as he agrees to today   Pt encouraged to return a call to [place] CM prn  [facility] RN CM sent a successful outreach letter as discussed with  [facility] brochure enclosed for review  Routed note to MDs  Joelene Millin L. Lavina Hamman, RN, BSN, [place] Coordinator Office number [phone] Mobile number [phone]  Main [facility] number [phone] Fax number [phone]

## 2019-03-05 ENCOUNTER — Other Ambulatory Visit: Payer: Self-pay

## 2019-03-05 NOTE — Patient Outreach (Signed)
[facility]) Care Management  03/05/2019  Frank Fischer [birth year] [id number]   Social Work referral received from [place] to contact patient regarding assistance with Advance Directives and smoking cessation resources.   Successful outreach to patient today.  Discussed HC POA and Living Will.  Will mail Advance Directive Emmi and packet to patient.  Also agreed to mail smoking cessation resources.  Will follow up within the next two weeks to ensure receipt of resources and further review Advance Directives if needed.  Ronn Melena, BSW Social Worker [phone]

## 2019-03-09 ENCOUNTER — Ambulatory Visit: Payer: Medicaid Other | Admitting: Physical Therapy

## 2019-03-11 ENCOUNTER — Other Ambulatory Visit: Payer: Self-pay | Admitting: *Deleted

## 2019-03-11 ENCOUNTER — Other Ambulatory Visit: Payer: Self-pay

## 2019-03-11 NOTE — Addendum Note (Signed)
Addended by: Barbaraann Faster on: 03/11/2019 04:06 PM   Modules accepted: Orders

## 2019-03-11 NOTE — Patient Outreach (Signed)
[facility]) Care Management  03/11/2019  AHIL RAFFO 1960/10/24 [id number]   EMMI- stroke follow up for below referral with initiation of short terms complex care services   RED ON EMMI ALERT Day # 3 Date: Tuesday 03/02/19 1003  Red Alert Reason: Who reached Patient Feeling worse overall? Yes Listened to these previous topics Medications Insurance:  Medicaid [place] access no medicare  [facility] admissions x  1 ED visits x1  in the last 6 months Admit date:02/25/2019 Discharge date:02/26/2019  Outreach attempt #2  Patient is able to verify HIPAA, DOB and Address Reviewed reason for follow up/care coordination call to [facility] with patient  Follow up on EMMI stroke contact call on 03/04/19 Mr Borell reports he is doing well today He states he things are going "slow but going" He continues to receive EMMI calls Reports her received one today 03/11/19  [facility] RN CM discussed answering all EMMI calls in a noise free environment to encouraged the EMMI system to pick up on the correct answer. He voiced understanding    Primary care MD and insurance coverage Mr Pridmore clarifies his primary MD is at [facility]. He states he was seen on 03/04/19 Epic updated He also confirms on today that he only has medicaid and no medicare coverage  [facility] CMA C James updated   HTN- Mr Cauldwell discusses that he is not checking his BP  Hr reports his future mother in law has a BP cuff at her home but he does not have one. [facility] RN CM discussed the importance of taking his BP prior to taking his BP medication and to generally monitor it at home related to his recent stroke He voiced understanding He inquire about purchase and local pharmacies were recommended He reports not having lots of funds [facility] RN CM asked [facility] CMA to send Mr Rupani a BP monitor device to his home address  Advance directives and smoke cessation resources Received by patient. He denies questions [facility] SW to f/u also    Neurology Outpatient therapies Mr Bachand has not started attending outpatient therapies yet He was scheduled on 03/09/19 to go but informs [facility] RN CM he did not feel well on 10/20 and did not go. [facility] assessed for possible transportation barrier but he confirms he has transportation to get to therapy. He confirms he is to see neurologist on 04/27/19 for f/u He report frustration with decreased use of his hand He reports mobility as "aggravating" He was encouraged to attend his outpatient therapy sessions to assist with his mobility and hand concerns He voiced understanding    Depression  Today he reports loss of interest in doing things and agrees for a referral to [facility] for depression resource and assistance  Falls He states he has "tripped up a few times" and denies any falls since d/c [facility] RN CM discussed fall prevention and falls related to his use of aspirin and recent stroke  Social: Mr KENNON SEMEDO is a 58 year old male who is single living at home alone in his apartment. He voices his support system is his fiance, [name] and brother Dewitt Cossette. He denies issues with transportation to medical appointments. He is independent in his care needs. H has 2 children and completed 12 years of education   Conditions: Acute CVA, HTN, polysubstance abuse including cocaine alcohol and tobacco, chronic tension headache, malignant neoplasm of prostate, HLD, hx of previous CVA  DME: none  Appointments 03/18/19 0930 neuro  PT tx [facility] 03/23/19 1315 neuro OT tx [facility] & PT at 1445 03/26/19 1100 neuro OT tx [facility] 03/30/19 1315 neuro OT tx [facility] 04/01/19 1445 neuro OT tx [facility] 04/06/19 1315 neuro OT tx [facility] 04/09/19 1230 neuro OT tx [facility] 04/13/19 1230 neuro OT tx [facility] 04/16/19 1230 neuro OT tx [facility] 04/20/19 1315 neuro OT tx [facility] 04/23/19 1230 neuro OT tx [facility]  neurologist on 04/27/19 0900 for f/u  04/27/19 1315 neuro OT tx [facility] 05/04/19 1315 neuro OT tx  [facility] 05/07/19 1230 neuro OT tx [facility] 05/11/19 1230 neuro OT tx [facility] 05/18/19 1315  neuro OT tx [facility]  Consent: [facility] RN CM reviewed [facility] services with patient. Patient gave verbal consent for [facility] telephonic RN CM services.  plans [facility] RN CM will follow up with Mr Harkins as agreed in 28-35 days for follow up  He was encouraged to contact [facility] RN CM prn  [facility] RN CM will refer Mr Gater to [facility] for assistance with depression and resources  Routed note to MDs [facility] MD involvement barriers letter   [facility] CM Care Plan Problem One     Most Recent Value  Care Plan Problem One  Risk for re admission related to recent stroke  Role Documenting the Problem One  Care Management Telephonic Coordinator  Care Plan for Problem One  Active  [facility] Long Term Goal   over the next 30 days patient will voice understanding of 2-3 interventions to manage his CVA, HTN during follow up calls  [facility] Long Term Goal Start Date  03/04/19  Interventions for Problem One Long Term Goal  assessed home care of cva & htn, discussed the importance of taking his BP prior to taking his BP medication and to generally monitor it at home related to his recent stroke, asked [facility] CMA to send Mr Gumz a BP monitor device to his home adddress  [facility] CM Short Term Goal #1   over the next 10 days patient will have [facility] SW contact to review smoke cessation and advance directive resources as verbalized during follow up call  [facility] CM Short Term Goal #1 Start Date  03/04/19  Interventions for Short Term Goal #1  Assessed contact with [facility] SW and resources sent, assessed for questions, to follow up   [facility] CM Short Term Goal #2   over the next 14 days patient will verbalize 2 interventions to manage CVA and HTN at home during follow up call  [facility] CM Short Term Goal #2 Start Date  03/04/19  Interventions for Short Term Goal #2  Assessed, educated, encouraged BP home monitoring and attendance to outpatient therapies       [name] L. Lavina Hamman,  RN, BSN, [place] Coordinator Office number [phone] Mobile number [phone]  Main [facility] number [phone] Fax number [phone]

## 2019-03-15 ENCOUNTER — Other Ambulatory Visit: Payer: Self-pay | Admitting: *Deleted

## 2019-03-15 NOTE — Patient Outreach (Addendum)
[name]) Care Management  03/15/2019  Frank Fischer January 21, 1961 [id number]   EMMI- stroke  RED ON EMMI ALERT Day # 13  Date:   Friday 03/12/19 [place] Reason:  Feeling worse overall? [name] to follow up appointment? No   Insurance:  Medicaid [place] access [facility] admissions x  1 ED visits x1  in the last 6 months Admit date:02/25/2019 Discharge date:02/26/2019  Outreach attempt successful  Patient is able to verify HIPAA, DOB and address [facility] Care Management RN reviewed and addressed red alert with patient   EMMI:  [name] denies that he was feeling worse on Friday 03/12/19 and on today 03/15/19 He reports he was only feeling bad on the day he was scheduled to got  to his first outpatient rehab session but issues have since resolved as discussed on 03/11/19 with this [place]  RN CM reminded [name] to be careful with background noises while answering EMMI call questions to prevent the automated system from picking up on incorrect answers He voiced understanding He is scheduled on 03/16/19 to go to his next scheduled outpatient rehab appointment. He confirms with [facility] RN Cm that he is aware of the location, time and has transportation to get to the appointment.   He reports at this time he does not have his co pay for the visit but may get it. [facility] is not able to assist with visit co pays. He was encouraged to discuss it with the outpatient staff upon his arrival on 03/16/19   Pt shared his height and weight as ht 5'10 wt last 198 lbs  Consent: [facility] RN CM reviewed [facility] services with patient. Patient gave verbal consent for services [facility] telephonic RN CM.   Advised patient that there will be further automated EMMI- post discharge calls to assess how the patient is doing following the recent hospitalization Advised the patient that another call may be received from a nurse if any of their responses were abnormal. Patient voiced understanding and  was appreciative of f/u call.  Social:[name] is a 58 year old male who is single living at home alone in his apartment. He voices his support system is his fiance, [name] and brother [name]. He denies issues with transportation to medical appointments. He is independent in his care needs. H has 2 children and completed 12 years of education  Conditions:Acute CVA, HTN, polysubstance abuseincluding cocaine alcohol and tobacco, chronic tension headache, malignant neoplasm of prostate, HLD, hx of previous CVA  [id number]  Appointments 03/16/19 1230 neuro PT tx [facility] 03/18/19 0930 neuro PT tx [facility] 03/23/19 1315 neuro OT tx [facility] & PT at 1445 03/26/19 1100 neuro OT tx [facility] 03/30/19 1315 neuro OT tx [facility] 04/01/19 1445 neuro OT tx [facility] 04/06/19 1315 neuro OT tx [facility] 04/09/19 1230 neuro OT tx [facility] 04/13/19 1230 neuro OT tx [facility] 04/16/19 1230 neuro OT tx [facility] 04/20/19 1315 neuro OT tx [facility] 04/23/19 1230 neuro OT tx [facility]   Plan: plans [facility] RN CM will follow up with [name] as agreed in 28-35 days for follow up  He was encouraged to contact [facility] RN CM prn  [facility] CM Care Plan Problem One     Most Recent Value  Care Plan Problem One  Risk for re admission related to recent stroke  Role Documenting the Problem One  Care Management [place] for Problem One  Active  [facility] Long Term  Goal   over the next 30 days patient will voice understanding of 2-3 interventions to manage his CVA, HTN during follow up calls  [place] Term Goal Start Date  03/04/19  Interventions for Problem One Long Term Goal  assessed for worsening issues and upcoming appointment  [facility] CM Short Term Goal #1   over the next 10 days patient will have [facility] contact to review smoke cessation and advance directive resources as verbalized during follow up call  [facility] CM Short Term Goal #1 Start Date  03/04/19  Interventions for Short Term Goal #1   Assessed, contact mady to have follow up SW contact  [facility] CM Short Term Goal #2   over the next 14 days patient will verbalize 2 interventions to manage CVA and HTN at home during follow up call  [facility] CM Short Term Goal #2 Start Date  03/04/19  Interventions for Short Term Goal #2  assessed for worsening issues and upcoming appointment       L. Lavina Hamman, RN, BSN, [place] Coordinator Office number [phone] Mobile number [phone]  Main [facility] number [phone] Fax number [phone]

## 2019-03-16 ENCOUNTER — Other Ambulatory Visit: Payer: Self-pay

## 2019-03-16 ENCOUNTER — Ambulatory Visit: Payer: Medicaid Other | Admitting: Physical Therapy

## 2019-03-16 VITALS — BP 140/97 | HR 85

## 2019-03-16 NOTE — Therapy (Signed)
[place] [address] [place], [place], [address] Phone: [phone]   Fax:  [phone]  Physical Therapy Treatment  Patient Details  Name: Frank Fischer MRN: [id number] Date of Birth: Oct 06, 1960 Referring Provider (PT): Eulogio Bear, DO   Encounter Date: 03/16/2019  PT End of Session - 03/16/19 1227    Visit Number  0    Number of Visits  4    Authorization Type  Medicaid    Authorization Time Period  CCME has approved 3 visits - 03/09/19 - 03/29/19    Authorization - Visit Number  0    Authorization - Number of Visits  3    PT Start Time  --    Equipment Utilized During Treatment  --    Activity Tolerance  --   unable to participate in PT secondary to high BP   Behavior During Therapy  [facility] for tasks assessed/performed       Past Medical History:  Diagnosis Date  . Arthritis   . Cervical stenosis of spine   . Chronic headaches   . Dental caries    periodontal disease  . Frequency of urination   . History of cerebral infarction 11-28-2017  per pt never had symptoms   per MRI imaging 10-31-2016 2 small chronic lacunar infartions in the right frontal periventricular white matter , and chronic left medial orbital fracture (as seen 2014 MRI)  . Hypertension   . Noncompliance with medication regimen   . Prostate cancer [facility]) urologist-  dr winter/  oncologist-  dr Tammi Klippel   dx 07-21-2017--- Stage T1c, Gleason 4+3,  PSA 13.40,  vol 49.7cc--  scheduled for radioactive seed implants 12-03-2017    Past Surgical History:  Procedure Laterality Date  . ABDOMINAL EXPLORATION SURGERY  age 64   per pt had Small Bowel Resection and Appendectomy  . APPENDECTOMY    . ARM SURGERY Left 1999   REPAIR INJURY-- "ARM CUT ABOUT OFF"  . PROSTATE BIOPSY  07/21/2017  dr winter office  . RADIOACTIVE SEED IMPLANT N/A 12/03/2017   Procedure: RADIOACTIVE SEED IMPLANT/BRACHYTHERAPY IMPLANT;  Surgeon: Ceasar Mons, MD;   Location[facility];  Service: Urology;  Laterality: N/A;  ONLY NEEDS 120 MIN  . SPACE OAR INSTILLATION N/A 12/03/2017   Procedure: SPACE OAR INSTILLATION;  Surgeon: Ceasar Mons, MD;  Location: [facility];  Service: Urology;  Laterality: N/A;  . TOOTH EXTRACTION N/A 09/21/2018   Procedure: DENTAL EXTRACTIONS WITH ALVEOLIPLASTY;  Surgeon: Diona Browner, DDS;  Location: [place];  Service: Oral Surgery;  Laterality: N/A;    Vitals:   03/16/19 1234 03/16/19 1241 03/16/19 1256  BP: (!) 158/101 (!) 149/99 (!) 140/97  Pulse: 81 79 85    Subjective Assessment - 03/16/19 1230    Subjective  No falls, states he feels like his walking has been getting better. Reports he is still having some dizziness - when he gets up in the morning and reports his dizziness will go away after an hour. Reports his head is hurting all the time. States that it is hard to put weight on first half of right foot - has been like this for 3 weeks. States that x-rays taken last [facility] at [facility], but he has not gotten the results back yet.    Patient is accompained by:  Family member   wife, [name]   Pertinent History  HTN, polysubstance abuse, tobacco abuse, and prostate cancer.    Diagnostic tests  MRI revelaed Infarcts in R precentral and postcentral gyrus, R parietal lobe, and R occipital lobe.    Patient Stated Goals  wants to get back to normal    Currently in Pain?  Yes    Pain Score  7     Pain Location  --   tingling in both fingers.   Pain Orientation  Right;Left    Pain Descriptors / Indicators  Tingling                                 PT Short Term Goals - 03/03/19 1035      PT SHORT TERM GOAL #1   Title  Patient will be independent with initial HEP in order to build upon functional gains made in therapy. ALL STGS DUE AFTER 3 VISITS.    Baseline  dependent    Status  New      PT SHORT TERM GOAL #2   Title  Patient will improve FGA score  to at least a 16/30 in order to demonstrate decreased fall risk.    Baseline  13/30 on 03/02/19    Status  New      PT SHORT TERM GOAL #3   Title  Patient's gait speed will improve to at least 2.70 ft/sec in order to demonstrate improved community ambulation.    Baseline  2.45 ft/sec on 03/02/19    Status  New      PT SHORT TERM GOAL #4   Title  Patient will ambulate at least 230' without AD and supervision without any episodes of tripping over/scuffing his L foot in order to demonstrate improved safety while ambulating.    Baseline  3 incidences on min A on 03/02/19 in order for pt to maintain balance.    Status  New        PT Long Term Goals - 03/03/19 1039      PT LONG TERM GOAL #1   Title  Patient will be independent with final HEP in order to build upon functional gains made in therapy. ALL LTGS DUE AFTER 9 VISITS.    Baseline  dependent with HEP    Status  New      PT LONG TERM GOAL #2   Title  Patient will improve FGA score to at least a 20/30 in order to demonstrate decreased fall risk.    Baseline  13/20 on 03/02/19    Status  New      PT LONG TERM GOAL #3   Title  Patient will report 2/10 or less dizzinss after performing gait while scanning environment up/down in order to improve safety during ambulation.    Baseline  currently 4/10 dizziness after performing head nods on FGA      PT LONG TERM GOAL #4   Title  Patient will improve BERG score to at least a 53/56 in order to demonstrate decreased fall risk.    Baseline  49/56    Status  New      PT LONG TERM GOAL #5   Title  Patient will perform 15 steps with step through pattern using single vs. B handrail and mod I in order to safely ascend/descend stairs in his home.    Baseline  not yet assessed    Status  New      Additional Long Term Goals   Additional Long Term Goals  Yes      PT LONG TERM  GOAL #6   Title  Patient will improve gait speed to at least 3.4 ft/sec  in order to demo improved community  ambulation.    Baseline  2.45 ft/sec on 03/02/19    Status  New            Plan - 03/16/19 1310    Clinical Impression Statement  Patient unable to participate in skilled PT today due to high BP at rest in sitting. BP initially taken and was 158/101. Pt asymptomatic and reports he took his BP medication this morning. Took it again 7 minutes later after further rest and BP read 149/99. Pt with reports of R foot pain that is increased with weight bearing - had not yet heard back results from PCP during session.  Therapist called pt's PCP office and discussed pt's high BP after taking his BP medication at rest and also asked for results of foot x-ray. Did not have results of x-ray during the phone call. Was told by PCP's office that pt's PCP will call to follow up regarding high BP and if pt starting demonstrating signs/symptoms of a CVA then to go to ED. Pt and pt's wife verbalized understanding. Therapist instructed pt's wife to buy a BP cuff in order to monitor at home, wife verbalized understanding.  Pt's physician called back after therapy session and stated that the x-ray for his right foot was negative for fracture.    Rehab Potential  Good    PT Treatment/Interventions  ADLs/Self Care Home Management;Electrical Stimulation;Gait training;Stair training;Functional mobility training;Balance training;Therapeutic exercise;Therapeutic activities;Neuromuscular re-education;Patient/family education;Orthotic Fit/Training;Vestibular    PT Next Visit Plan  vestibular assessment. initial HEP for balance/ LE strengthening.    Consulted and Agree with Plan of Care  Patient;Family member/caregiver    Family Member Consulted  pt's wife [name]       Patient will benefit from skilled therapeutic intervention in order to improve the following deficits and impairments:     Visit Diagnosis: Unsteadiness on feet  Other abnormalities of gait and mobility  Muscle weakness (generalized)  Dizziness and  giddiness     Problem List Patient Active Problem List   Diagnosis Date Noted  . Acute CVA (cerebrovascular accident) ([place]) 02/26/2019  . Polysubstance abuse ([place]) 02/26/2019  . Malignant neoplasm of prostate ([facility]) 10/02/2017  . Noncompliance with medication regimen 12/18/2016  . Chronic tension type headache 10/20/2016  . Headache(784.0) 10/19/2013  . Itching 10/19/2013  . Smoker 10/19/2013  . Essential hypertension, benign 07/08/2013  . Pain in joint, shoulder region 07/08/2013  . Cervical disc disorder with radiculopathy of cervical region 07/08/2013  . Dental caries 07/08/2013  . Spinal stenosis in cervical region 07/07/2013  . Tendinitis of left rotator cuff 07/07/2013    Arliss Journey, PT, DPT 03/16/2019, 4:01 PM  [place] [address] [place], [place], [address] Phone: [phone]   Fax:  [phone]  Name: PATTERSON PLOTT MRN: [id number] Date of Birth: 07/19/1960

## 2019-03-18 ENCOUNTER — Other Ambulatory Visit: Payer: Self-pay

## 2019-03-18 ENCOUNTER — Ambulatory Visit: Payer: Medicaid Other

## 2019-03-18 ENCOUNTER — Ambulatory Visit: Payer: Self-pay

## 2019-03-18 NOTE — Patient Outreach (Signed)
[facility] [facility]) Care Management  03/18/2019  SILVAN FEIDER [birth year] [id number]   Successful follow up call to patient today.  He confirmed receipt of Advance Directives packet and smoking cessation resources.  Patient denied having questions at this time but acknowledged that he has not thoroughly reviewed documentation.  Patient was encouraged to call if he needs assistance with completion of documents. Per request from [facility], Joellyn Quails, inquired about need for resources for depressive symptoms.  Patient stated "I'm not really depressed, I just get overwhelmed sometimes."  Patient stated that he would be interested in counseling and psychiatric services.  Due to patient having Medicaid as primary insurance, he was encouraged to call [facility] and was provided with contact information.  Closing case at this time but did encourage him to call if additional needs arise.  Ronn Melena, BSW Social Worker [phone]

## 2019-03-23 ENCOUNTER — Encounter: Payer: Self-pay | Admitting: Physical Therapy

## 2019-03-23 ENCOUNTER — Ambulatory Visit: Payer: Medicaid Other | Attending: Internal Medicine | Admitting: Physical Therapy

## 2019-03-23 ENCOUNTER — Ambulatory Visit: Payer: Medicaid Other | Admitting: Occupational Therapy

## 2019-03-23 ENCOUNTER — Other Ambulatory Visit: Payer: Self-pay

## 2019-03-23 ENCOUNTER — Encounter: Payer: Self-pay | Admitting: Occupational Therapy

## 2019-03-23 VITALS — BP 156/91 | HR 56

## 2019-03-23 VITALS — BP 137/85 | HR 59

## 2019-03-23 DIAGNOSIS — R278 Other lack of coordination: Secondary | ICD-10-CM | POA: Diagnosis present

## 2019-03-23 DIAGNOSIS — M6281 Muscle weakness (generalized): Secondary | ICD-10-CM

## 2019-03-23 DIAGNOSIS — R414 Neurologic neglect syndrome: Secondary | ICD-10-CM | POA: Diagnosis present

## 2019-03-23 DIAGNOSIS — R2681 Unsteadiness on feet: Secondary | ICD-10-CM

## 2019-03-23 DIAGNOSIS — R41840 Attention and concentration deficit: Secondary | ICD-10-CM | POA: Insufficient documentation

## 2019-03-23 DIAGNOSIS — I69354 Hemiplegia and hemiparesis following cerebral infarction affecting left non-dominant side: Secondary | ICD-10-CM

## 2019-03-23 DIAGNOSIS — R42 Dizziness and giddiness: Secondary | ICD-10-CM | POA: Diagnosis present

## 2019-03-23 DIAGNOSIS — R2689 Other abnormalities of gait and mobility: Secondary | ICD-10-CM

## 2019-03-23 DIAGNOSIS — R41842 Visuospatial deficit: Secondary | ICD-10-CM | POA: Diagnosis present

## 2019-03-23 NOTE — Patient Instructions (Addendum)
Blood Pressure Record Sheet To take your blood pressure, you will need a blood pressure machine. You can buy a blood pressure machine (blood pressure monitor) at your clinic, drug store, or online. When choosing one, consider:  An automatic monitor that has an arm cuff.  A cuff that wraps snugly around your upper arm. You should be able to fit only one finger between your arm and the cuff.  A device that stores blood pressure reading results.  Do not choose a monitor that measures your blood pressure from your wrist or finger. Follow your health care provider's instructions for how to take your blood pressure. To use this form:  Get one reading in the morning (a.m.) before you take any medicines.  Get one reading in the evening (p.m.) before supper.  Take at least 2 readings with each blood pressure check. This makes sure the results are correct. Wait 1-2 minutes between measurements.  Write down the results in the spaces on this form.  Repeat this once a week, or as told by your health care provider.  Make a follow-up appointment with your health care provider to discuss the results. Blood pressure log Date: _______________________  a.m. _____________________(1st reading) _____________________(2nd reading)  p.m. _____________________(1st reading) _____________________(2nd reading) Date: _______________________  a.m. _____________________(1st reading) _____________________(2nd reading)  p.m. _____________________(1st reading) _____________________(2nd reading) Date: _______________________  a.m. _____________________(1st reading) _____________________(2nd reading)  p.m. _____________________(1st reading) _____________________(2nd reading) Date: _______________________  a.m. _____________________(1st reading) _____________________(2nd reading)  p.m. _____________________(1st reading) _____________________(2nd reading) Date: _______________________  a.m.  _____________________(1st reading) _____________________(2nd reading)  p.m. _____________________(1st reading) _____________________(2nd reading) This information is not intended to replace advice given to you by your health care provider. Make sure you discuss any questions you have with your health care provider. Document Released: 02/02/2003 Document Revised: 07/04/2017 Document Reviewed: 05/06/2017 Elsevier Patient Education  2020 [employer].     Access Code: [email or web address]  URL: [email or web address]  Date: 03/23/2019  Prepared by: Janann August   Exercises Sit to Stand - 5 reps - 2 sets - 2x daily - 7x weekly Tandem Walking with Counter Support - 3 sets - 2x daily - 7x weekly Heel Walking - 4 sets - 2x daily - 7x weekly Tandem Stance with Support - 10 reps - 3 sets - 30 hold - 2x daily                            - 7x weekly Romberg Stance Eyes Closed on Foam Pad - 3 sets - 30 hold - 2x daily - 7x weekly Standing Romberg to 1/2 Tandem Stance - 3 sets - 20 hold - 2x daily - 7x weekly

## 2019-03-23 NOTE — Therapy (Signed)
[facility] [address] [place], [place], [address] Phone: [phone]   Fax:  [phone]  Occupational Therapy Treatment  Patient Details  Name: Frank Fischer MRN: [id number] Date of Birth: 04-19-1961 Referring Provider (OT): Dr. Eulogio Bear   Encounter Date: 03/23/2019  OT End of Session - 03/23/19 1328    Visit Number  2    Number of Visits  18    Date for OT Re-Evaluation  05/08/19    Authorization Type  Medicaid--approved for 12 visits 03/23/19-05/03/19    Authorization - Visit Number  1    Authorization - Number of Visits  12    OT Start Time  [id number]    OT Stop Time  1400    OT Time Calculation (min)  39 min    Activity Tolerance  Patient tolerated treatment well    Behavior During Therapy  [facility] for tasks assessed/performed       Past Medical History:  Diagnosis Date  . Arthritis   . Cervical stenosis of spine   . Chronic headaches   . Dental caries    periodontal disease  . Frequency of urination   . History of cerebral infarction 11-28-2017  per pt never had symptoms   per MRI imaging 10-31-2016 2 small chronic lacunar infartions in the right frontal periventricular white matter , and chronic left medial orbital fracture (as seen 2014 MRI)  . Hypertension   . Noncompliance with medication regimen   . Prostate cancer [facility]) urologist-  dr winter/  oncologist-  dr Tammi Klippel   dx 07-21-2017--- Stage T1c, Gleason 4+3,  PSA 13.40,  vol 49.7cc--  scheduled for radioactive seed implants 12-03-2017    Past Surgical History:  Procedure Laterality Date  . ABDOMINAL EXPLORATION SURGERY  age 1   per pt had Small Bowel Resection and Appendectomy  . APPENDECTOMY    . ARM SURGERY Left 1999   REPAIR INJURY-- "ARM CUT ABOUT OFF"  . PROSTATE BIOPSY  07/21/2017  dr winter office  . RADIOACTIVE SEED IMPLANT N/A 12/03/2017   Procedure: RADIOACTIVE SEED IMPLANT/BRACHYTHERAPY IMPLANT;  Surgeon: Ceasar Mons, MD;  Location: [facility];  Service: Urology;  Laterality: N/A;  ONLY NEEDS 120 MIN  . SPACE OAR INSTILLATION N/A 12/03/2017   Procedure: SPACE OAR INSTILLATION;  Surgeon: Ceasar Mons, MD;  Location: [facility];  Service: Urology;  Laterality: N/A;  . TOOTH EXTRACTION N/A 09/21/2018   Procedure: DENTAL EXTRACTIONS WITH ALVEOLIPLASTY;  Surgeon: Diona Browner, DDS;  Location: [facility];  Service: Oral Surgery;  Laterality: N/A;    Vitals:   03/23/19 1325  BP: 137/85  Pulse: (!) 59    Subjective Assessment - 03/23/19 1326    Subjective   about the same    Patient is accompanied by:  Family member   wife--[name]   Patient Stated Goals  improve vision    Currently in Pain?  Yes    Pain Score  6     Pain Location  Hand    Pain Orientation  Right;Left    Pain Descriptors / Indicators  Aching;Tingling;Burning    Pain Type  Chronic pain    Pain Onset  More than a month ago    Pain Frequency  Constant    Aggravating Factors   nothing    Pain Relieving Factors  nothing        Completing 12 piece puzzle with 1 v.c., mod incr time/difficulty, particularly on L side.  16M Visual scanning/cancellation sheet with approx 91% accuracy due to skipping 1 line.  Recommended pt complete simple 24-piece puzzles, word searches at home.  Pt instructed to look for edges of surfaces/objects on L side to compensate for visual-perceptual deficits.  Pt/wife verbalized understanding.               OT Education - 03/23/19 1346    Education Details  Coordination and Red Putty HEP--see pt instructions    Person(s) Educated  Patient;Spouse    Methods  Explanation;Demonstration;Verbal cues;Handout    Comprehension  Verbalized understanding;Returned demonstration;Verbal cues required       OT Short Term Goals - 03/02/19 1613      OT SHORT TERM GOAL #1   Title  Pt will be independent with HEP for LUE coordination/strength.--check STGs 04/01/19     Baseline  no HEP/dependent    Time  4    Period  Weeks    Status  New      OT SHORT TERM GOAL #2   Title  Pt will improve LUE coordination for ADLs as shown by improving time on 9-hole peg test to 60sec or less.    Baseline  77sec    Time  4    Period  Weeks    Status  New      OT SHORT TERM GOAL #3   Title  Pt will improve L grip strength by at least 8lbs to assist with opening containers.    Baseline  20.5    Time  4    Period  Weeks    Status  New      OT SHORT TERM GOAL #4   Title  Pt will be able to use LUE as nondominant assist consistently for BADLs (including pulling up underwear and donning socks).    Baseline  unable to pull up underwear with LUE, unable to cut food with fork/knife, and unable to don socks    Time  4    Period  Weeks    Status  New      OT SHORT TERM GOAL #5   Title  Pt will perform simple tabletop visual scanning with at least 75% accuracy.    Baseline  45%, did not scan to L of midline    Time  4    Period  Weeks    Status  New        OT Long Term Goals - 03/02/19 1620      OT LONG TERM GOAL #1   Title  Pt will be indepenent with compensation strategies for visual deficits and visual HEP.--check LTGs 05/08/19    Baseline  not scanning to L of midline with reading, bumping into items on L side, no visual HEP/dependent    Time  8    Period  Weeks    Status  New      OT LONG TERM GOAL #2   Title  Pt will improve LUE coordination for ADLs as shown by improving time on 9-hole peg test to 45sec or less.    Baseline  77sec    Time  8    Period  Weeks    Status  New      OT LONG TERM GOAL #3   Title  Pt will improve L grip strength by at least 16bs to assist with opening containers.    Baseline  20.5lbs    Time  8    Period  Weeks    Status  New      OT LONG TERM GOAL #4   Title  Pt will be able to retrieve 3lb object from overhead shelf with LUE demonstrating good control/safety.    Baseline  decr control/safety, not using LUE for  functional reach    Time  8    Period  Weeks    Status  New      OT LONG TERM GOAL #5   Title  Pt will perform simple tabletop visual scanning with at least 90% accuracy.    Baseline  approx 45% accuracy    Time  8    Period  Weeks    Status  New      OT LONG TERM GOAL #6   Title  Pt will perform simple environmental scanning with at least 80% accuracy for incr safety in home and community.    Baseline  frequently bumping into things on L side within the home, only 45% visual scanning tabletop level, pt did not look to L unless cued    Time  8    Period  Weeks    Status  New      OT LONG TERM GOAL #7   Title  Pt will be able to complete simple cooking task with supervision.    Baseline  not performing, decr safety due to decr LUE functional use and visual field deficit    Time  8    Period  Weeks    Status  New            Plan - 03/23/19 1331    Clinical Impression Statement  Pt is progressing towards goals with improving LUE functional use and coordination/control.    OT Occupational Profile and History  Detailed Assessment- Review of Records and additional review of physical, cognitive, psychosocial history related to current functional performance    Occupational performance deficits (Please refer to evaluation for details):  ADL's;IADL's;Leisure;Social Participation    Body Structure / Function / Physical Skills  ADL;Dexterity;ROM;Vision;IADL;Balance;Sensation;Strength;FMC;Coordination;UE functional use;GMC;Decreased knowledge of use of DME    Cognitive Skills  Attention;Perception    Rehab Potential  Good    Clinical Decision Making  Several treatment options, min-mod task modification necessary    Comorbidities Affecting Occupational Performance:  May have comorbidities impacting occupational performance    Modification or Assistance to Complete Evaluation   Min-Moderate modification of tasks or assist with assess necessary to complete eval    OT Frequency  2x / week     OT Duration  --   9 weeks +eval (or 17 visits +eval)   OT Treatment/Interventions  Self-care/ADL training;Moist Heat;DME and/or AE instruction;Balance training;Therapeutic activities;Therapeutic exercise;Cognitive remediation/compensation;Visual/perceptual remediation/compensation;Passive range of motion;Functional Mobility Training;Neuromuscular education;Cryotherapy;Energy conservation;Manual Therapy;Patient/family education    Plan  visual HEP/compensation strategies    Consulted and Agree with Plan of Care  Patient;Family member/caregiver    Family Member Consulted  wife--[name]       Patient will benefit from skilled therapeutic intervention in order to improve the following deficits and impairments:   Body Structure / Function / Physical Skills: ADL, Dexterity, ROM, Vision, IADL, Balance, Sensation, Strength, FMC, Coordination, UE functional use, GMC, Decreased knowledge of use of DME Cognitive Skills: Attention, Perception     Visit Diagnosis: Other lack of coordination  Neurologic neglect syndrome  Attention and concentration deficit  Visuospatial deficit  Hemiplegia and hemiparesis following cerebral infarction affecting left non-dominant side (HCC)  Muscle weakness (generalized)  Unsteadiness on feet    Problem List Patient Active  Problem List   Diagnosis Date Noted  . Acute CVA (cerebrovascular accident) ([place]) 02/26/2019  . Polysubstance abuse ([place]) 02/26/2019  . Malignant neoplasm of prostate ([place]) 10/02/2017  . Noncompliance with medication regimen 12/18/2016  . Chronic tension type headache 10/20/2016  . Headache(784.0) 10/19/2013  . Itching 10/19/2013  . Smoker 10/19/2013  . Essential hypertension, benign 07/08/2013  . Pain in joint, shoulder region 07/08/2013  . Cervical disc disorder with radiculopathy of cervical region 07/08/2013  . Dental caries 07/08/2013  . Spinal stenosis in cervical region 07/07/2013  . Tendinitis of left rotator cuff  07/07/2013    [facility] 03/23/2019, 1:59 PM  [facility] [address] [place], [place], [address] Phone: [phone]   Fax:  [phone]  Name: Frank Fischer MRN: [id number] Date of Birth: October 25, 1960   Vianne Bulls, OTR/L [facility] [address] [place], [place]  [address] [phone] phone [phone] 03/23/19 2:48 PM

## 2019-03-23 NOTE — Therapy (Signed)
[facility] [address] [place], [place], [address] Phone: [phone]   Fax:  [phone]  Physical Therapy Treatment  Patient Details  Name: Frank Fischer MRN: [id number] Date of Birth: 01/20/1961 Referring Provider (PT): Eulogio Bear, DO   Encounter Date: 03/23/2019  PT End of Session - 03/23/19 1531    Visit Number  1    Number of Visits  4    Authorization Type  Medicaid    Authorization Time Period  [employer] has approved 3 visits - 03/09/19 - 03/29/19    Authorization - Visit Number  1    Authorization - Number of Visits  3    PT Start Time  [id number]    PT Stop Time  1527    PT Time Calculation (min)  42 min    Equipment Utilized During Treatment  Gait belt    Activity Tolerance  Patient tolerated treatment well   unable to participate in PT secondary to high BP   Behavior During Therapy  [facility] for tasks assessed/performed       Past Medical History:  Diagnosis Date  . Arthritis   . Cervical stenosis of spine   . Chronic headaches   . Dental caries    periodontal disease  . Frequency of urination   . History of cerebral infarction 11-28-2017  per pt never had symptoms   per MRI imaging 10-31-2016 2 small chronic lacunar infartions in the right frontal periventricular white matter , and chronic left medial orbital fracture (as seen 2014 MRI)  . Hypertension   . Noncompliance with medication regimen   . Prostate cancer [facility]) urologist-  dr winter/  oncologist-  dr Tammi Klippel   dx 07-21-2017--- Stage T1c, Gleason 4+3,  PSA 13.40,  vol 49.7cc--  scheduled for radioactive seed implants 12-03-2017    Past Surgical History:  Procedure Laterality Date  . ABDOMINAL EXPLORATION SURGERY  age 29   per pt had Small Bowel Resection and Appendectomy  . APPENDECTOMY    . ARM SURGERY Left 1999   REPAIR INJURY-- "ARM CUT ABOUT OFF"  . PROSTATE BIOPSY  07/21/2017  dr winter office  . RADIOACTIVE SEED IMPLANT N/A 12/03/2017   Procedure: RADIOACTIVE SEED IMPLANT/BRACHYTHERAPY IMPLANT;  Surgeon: Ceasar Mons, MD;  Location: [facility];  Service: Urology;  Laterality: N/A;  ONLY NEEDS 120 MIN  . SPACE OAR INSTILLATION N/A 12/03/2017   Procedure: SPACE OAR INSTILLATION;  Surgeon: Ceasar Mons, MD;  Location: [facility];  Service: Urology;  Laterality: N/A;  . TOOTH EXTRACTION N/A 09/21/2018   Procedure: DENTAL EXTRACTIONS WITH ALVEOLIPLASTY;  Surgeon: Diona Browner, DDS;  Location: [facility];  Service: Oral Surgery;  Laterality: N/A;    Vitals:   03/23/19 1452  BP: (!) 156/91  Pulse: (!) 56    Subjective Assessment - 03/23/19 1445    Subjective  No falls. Still having some dizziness sometimes when he wakes up in the morning. MD's office never called due to high BP. MD office called therapist after last session stating negative R foot fracture. Pt's BP 137/85 from previous OT session.    Patient is accompained by:  Family member   wife, [name]   Pertinent History  HTN, polysubstance abuse, tobacco abuse, and prostate cancer.    Diagnostic tests  MRI revelaed Infarcts in R precentral and postcentral gyrus, R parietal lobe, and R occipital lobe.    Patient Stated Goals  wants to get back to normal  Pain Score  7     Pain Location  Hand    Pain Orientation  Right;Left    Pain Descriptors / Indicators  Aching;Tingling;Burning    Pain Type  Chronic pain    Pain Onset  More than a month ago    Pain Frequency  Constant                      Access Code: [email or web address]  URL: [email or web address]  Date: 03/23/2019  Prepared by: Janann August   Initiated HEP for LE strength, counter, and corner balance:   Exercises Sit to Stand - 5 reps - 2 sets - 2x daily - 7x weekly Tandem Walking with Counter Support - 3 sets - 2x daily - 7x weekly Heel Walking - 4 sets - 2x daily - 7x weekly Tandem Stance with Support - 10 reps - 3 sets - 30 hold -  2x daily - 7x weekly Romberg Stance Eyes Closed on Foam Pad - 3 sets - 30 hold - 2x daily - 7x weekly Standing Romberg to 1/2 Tandem Stance - 3 sets - 20 hold - 2x daily - 7x weekly  [facility] Adult PT Treatment/Exercise - 03/23/19 0001      Self-Care   Self-Care  Other Self-Care Comments    Other Self-Care Comments   Pt reporting that he had not heard the results of his foot x-ray, therapist stated that physician's office called after pt's last session that R foot x-ray was negative for fracture. Educated pt to follow up with physician due to continued R toe pain. Pt's BP elevated (see vitals) since OT session when pt's BP was 137/85 - pt and pt's wife reported that they went to [employer] during 45 minute break between session and puts extra salt on his food. Therapist educated that excess salt in the diet can contribute to high BP and that pt should limit his intake of salt to prevent increases in BP. Pt and pt's wife verbalized understanding. Provided a BP log handout to monitor BP at least 2x a day and record it and to follow up with MD if readings remain elevated.              PT Education - 03/23/19 1530    Education Details  initial HEP, BP log to monitor readings at home daily, trying to limit salt in diet due to effects on blood pressure.    Person(s) Educated  Patient;Spouse    Methods  Explanation;Handout;Demonstration    Comprehension  Verbalized understanding;Returned demonstration       PT Short Term Goals - 03/03/19 1035      PT SHORT TERM GOAL #1   Title  Patient will be independent with initial HEP in order to build upon functional gains made in therapy. ALL STGS DUE AFTER 3 VISITS.    Baseline  dependent    Status  New      PT SHORT TERM GOAL #2   Title  Patient will improve FGA score to at least a 16/30 in order to demonstrate decreased fall risk.    Baseline  13/30 on 03/02/19    Status  New      PT SHORT TERM GOAL #3   Title  Patient's gait speed will improve  to at least 2.70 ft/sec in order to demonstrate improved community ambulation.    Baseline  2.45 ft/sec on 03/02/19    Status  New      PT SHORT TERM GOAL #4  Title  Patient will ambulate at least 230' without AD and supervision without any episodes of tripping over/scuffing his L foot in order to demonstrate improved safety while ambulating.    Baseline  3 incidences on min A on 03/02/19 in order for pt to maintain balance.    Status  New        PT Long Term Goals - 03/03/19 1039      PT LONG TERM GOAL #1   Title  Patient will be independent with final HEP in order to build upon functional gains made in therapy. ALL LTGS DUE AFTER 9 VISITS.    Baseline  dependent with HEP    Status  New      PT LONG TERM GOAL #2   Title  Patient will improve FGA score to at least a 20/30 in order to demonstrate decreased fall risk.    Baseline  13/20 on 03/02/19    Status  New      PT LONG TERM GOAL #3   Title  Patient will report 2/10 or less dizzinss after performing gait while scanning environment up/down in order to improve safety during ambulation.    Baseline  currently 4/10 dizziness after performing head nods on FGA      PT LONG TERM GOAL #4   Title  Patient will improve BERG score to at least a 53/56 in order to demonstrate decreased fall risk.    Baseline  49/56    Status  New      PT LONG TERM GOAL #5   Title  Patient will perform 15 steps with step through pattern using single vs. B handrail and mod I in order to safely ascend/descend stairs in his home.    Baseline  not yet assessed    Status  New      Additional Long Term Goals   Additional Long Term Goals  Yes      PT LONG TERM GOAL #6   Title  Patient will improve gait speed to at least 3.4 ft/sec  in order to demo improved community ambulation.    Baseline  2.45 ft/sec on 03/02/19    Status  New            Plan - 03/23/19 1538    Clinical Impression Statement  Focus of today's session was initiating HEP for LE  strengthening and balance and education about BP management. Pt had OT session prior and pt's BP elevated when coming back to PT after eating a meal high in salt - educated pt and wife about impact of salt in the diet and elevated BP and to monitor BP daily, both verbalized understanding. However, BP WFL to participate in therapy today. Issued standing and corner balance exercises for home, pt reporting increased dizziness to 5-6/10 when attempting to perform balance with head turns. Did not add to HEP at this time, will further assess vestibular system at next visit. Will continue to progress towards LTGs.    Rehab Potential  Good    PT Treatment/Interventions  ADLs/Self Care Home Management;Electrical Stimulation;Gait training;Stair training;Functional mobility training;Balance training;Therapeutic exercise;Therapeutic activities;Neuromuscular re-education;Patient/family education;Orthotic Fit/Training;Vestibular    PT Next Visit Plan  vestibular assessment, how was HEP?, need to check STGs for re-auth.    PT Home Exercise Plan  [email or web address]    Consulted and Agree with Plan of Care  Patient;Family member/caregiver    Family Member Consulted  pt's wife [name]       Patient will benefit from skilled  therapeutic intervention in order to improve the following deficits and impairments:     Visit Diagnosis: Unsteadiness on feet  Other abnormalities of gait and mobility  Muscle weakness (generalized)  Dizziness and giddiness     Problem List Patient Active Problem List   Diagnosis Date Noted  . Acute CVA (cerebrovascular accident) ([place]) 02/26/2019  . Polysubstance abuse ([place]) 02/26/2019  . Malignant neoplasm of prostate ([place]) 10/02/2017  . Noncompliance with medication regimen 12/18/2016  . Chronic tension type headache 10/20/2016  . Headache(784.0) 10/19/2013  . Itching 10/19/2013  . Smoker 10/19/2013  . Essential hypertension, benign 07/08/2013  . Pain in joint, shoulder region 07/08/2013   . Cervical disc disorder with radiculopathy of cervical region 07/08/2013  . Dental caries 07/08/2013  . Spinal stenosis in cervical region 07/07/2013  . Tendinitis of left rotator cuff 07/07/2013    Arliss Journey, PT, DPT 03/23/2019, 3:41 PM  [facility] [address] [place], [place], [address] Phone: [phone]   Fax:  [phone]  Name: Frank Fischer MRN: [id number] Date of Birth: [birth year]

## 2019-03-23 NOTE — Patient Instructions (Addendum)
    Coordination Activities  Perform the following activities for 15-20 minutes 1 times per day with left hand(s).   Rotate ball in fingertips (clockwise and counter-clockwise).  Flip cards 1 at a time   Deal cards with your thumb (Hold deck in hand and push card off top with thumb).  Pick up coins and stack.  Pick up coins one at a time until you get 5-10 in your hand, then move coins from palm to fingertips to place in coin bank or container one at a time.  Squeeze putty with your whole hand.  Good squeezed getting your fingers into the putty as much as you can.  15 times  Roll out putty into a log, then pinch log with each finger/thumb (can do ring/little finger together).  Roll out 2-3x.

## 2019-03-25 ENCOUNTER — Ambulatory Visit: Payer: Medicaid Other | Admitting: Physical Therapy

## 2019-03-26 ENCOUNTER — Ambulatory Visit: Payer: Medicaid Other | Admitting: Occupational Therapy

## 2019-03-29 ENCOUNTER — Other Ambulatory Visit: Payer: Self-pay

## 2019-03-29 ENCOUNTER — Encounter: Payer: Self-pay | Admitting: Physical Therapy

## 2019-03-29 ENCOUNTER — Ambulatory Visit: Payer: Medicaid Other | Admitting: Physical Therapy

## 2019-03-29 VITALS — BP 142/89 | HR 91

## 2019-03-29 DIAGNOSIS — R42 Dizziness and giddiness: Secondary | ICD-10-CM

## 2019-03-29 DIAGNOSIS — R2681 Unsteadiness on feet: Secondary | ICD-10-CM

## 2019-03-29 DIAGNOSIS — R2689 Other abnormalities of gait and mobility: Secondary | ICD-10-CM

## 2019-03-29 DIAGNOSIS — R278 Other lack of coordination: Secondary | ICD-10-CM

## 2019-03-30 ENCOUNTER — Ambulatory Visit: Payer: Medicaid Other | Admitting: Occupational Therapy

## 2019-03-30 NOTE — Therapy (Signed)
[facility] [address] [place], [place], [address] Phone: [phone]   Fax:  [phone]  Physical Therapy Treatment  Patient Details  Name: Frank Fischer MRN: [id number] Date of Birth: 25-Jan-1961 Referring Provider (PT): Eulogio Bear, DO   Encounter Date: 03/29/2019  PT End of Session - 03/30/19 0936    Visit Number  2    Number of Visits  4    Authorization Type  Medicaid    Authorization Time Period  [employer] has approved 3 visits - 03/09/19 - 03/29/19    Authorization - Visit Number  2    Authorization - Number of Visits  3    PT Start Time  0352    PT Stop Time  1444    PT Time Calculation (min)  41 min    Equipment Utilized During Treatment  Gait belt    Activity Tolerance  Patient tolerated treatment well   unable to participate in PT secondary to high BP   Behavior During Therapy  [facility] for tasks assessed/performed       Past Medical History:  Diagnosis Date  . Arthritis   . Cervical stenosis of spine   . Chronic headaches   . Dental caries    periodontal disease  . Frequency of urination   . History of cerebral infarction 11-28-2017  per pt never had symptoms   per MRI imaging 10-31-2016 2 small chronic lacunar infartions in the right frontal periventricular white matter , and chronic left medial orbital fracture (as seen 2014 MRI)  . Hypertension   . Noncompliance with medication regimen   . Prostate cancer [facility]) urologist-  dr winter/  oncologist-  dr Tammi Klippel   dx 07-21-2017--- Stage T1c, Gleason 4+3,  PSA 13.40,  vol 49.7cc--  scheduled for radioactive seed implants 12-03-2017    Past Surgical History:  Procedure Laterality Date  . ABDOMINAL EXPLORATION SURGERY  age 70   per pt had Small Bowel Resection and Appendectomy  . APPENDECTOMY    . ARM SURGERY Left 1999   REPAIR INJURY-- "ARM CUT ABOUT OFF"  . PROSTATE BIOPSY  07/21/2017  dr winter office  . RADIOACTIVE SEED IMPLANT N/A 12/03/2017    Procedure: RADIOACTIVE SEED IMPLANT/BRACHYTHERAPY IMPLANT;  Surgeon: Ceasar Mons, MD;  Location: [facility];  Service: Urology;  Laterality: N/A;  ONLY NEEDS 120 MIN  . SPACE OAR INSTILLATION N/A 12/03/2017   Procedure: SPACE OAR INSTILLATION;  Surgeon: Ceasar Mons, MD;  Location: [facility];  Service: Urology;  Laterality: N/A;  . TOOTH EXTRACTION N/A 09/21/2018   Procedure: DENTAL EXTRACTIONS WITH ALVEOLIPLASTY;  Surgeon: Diona Browner, DDS;  Location: [facility];  Service: Oral Surgery;  Laterality: N/A;    Vitals:   03/29/19 1408  BP: (!) 142/89  Pulse: 91    Subjective Assessment - 03/29/19 1405    Subjective  No falls, no recent dizziness. Has been trying his exercises at home. Has an appointment with PCP tomorrow regarding BP.    Patient is accompained by:  Family member   wife, [name]   Pertinent History  HTN, polysubstance abuse, tobacco abuse, and prostate cancer.    Diagnostic tests  MRI revelaed Infarcts in R precentral and postcentral gyrus, R parietal lobe, and R occipital lobe.    Patient Stated Goals  wants to get back to normal    Currently in Pain?  Yes    Pain Score  5     Pain Location  Hand    Pain Orientation  Right;Left    Pain Descriptors / Indicators  Aching;Burning;Tingling    Pain Onset  More than a month ago         [facility] PT Assessment - 03/29/19 1416      Functional Gait  Assessment   Gait assessed   Yes    Gait Level Surface  Walks 20 ft in less than 7 sec but greater than 5.5 sec, uses assistive device, slower speed, mild gait deviations, or deviates 6-10 in outside of the 12 in walkway width.   6.67 seconds   Change in Gait Speed  Makes only minor adjustments to walking speed, or accomplishes a change in speed with significant gait deviations, deviates 10-15 in outside the 12 in walkway width, or changes speed but loses balance but is able to recover and continue walking.    Gait with Horizontal  Head Turns  Performs head turns smoothly with slight change in gait velocity (eg, minor disruption to smooth gait path), deviates 6-10 in outside 12 in walkway width, or uses an assistive device.    Gait with Vertical Head Turns  Performs task with slight change in gait velocity (eg, minor disruption to smooth gait path), deviates 6 - 10 in outside 12 in walkway width or uses assistive device    Gait and Pivot Turn  Pivot turns safely within 3 sec and stops quickly with no loss of balance.    Step Over Obstacle  Is able to step over one shoe box (4.5 in total height) without changing gait speed. No evidence of imbalance.    Gait with Narrow Base of Support  Is able to ambulate for 10 steps heel to toe with no staggering.    Gait with Eyes Closed  Walks 20 ft, uses assistive device, slower speed, mild gait deviations, deviates 6-10 in outside 12 in walkway width. Ambulates 20 ft in less than 9 sec but greater than 7 sec.   9.43 seconds    Ambulating Backwards  Walks 20 ft, uses assistive device, slower speed, mild gait deviations, deviates 6-10 in outside 12 in walkway width.    Steps  Alternating feet, no rail.    Total Score  22    FGA comment:  22/30                   [facility] Adult PT Treatment/Exercise - 03/29/19 1416      Ambulation/Gait   Ambulation/Gait  Yes    Ambulation/Gait Assistance  5: Supervision    Ambulation/Gait Assistance Details  Pt with no episodes of scuffing his L foot today during gait with no LOB. Added dynamic tasks by asking pt to scan environment and stop walking and re-start walking with no LOB.,    Ambulation Distance (Feet)  250 Feet    Assistive device  None    Gait Pattern  Step-through pattern;Decreased step length - right;Decreased arm swing - left;Decreased stance time - left;Decreased dorsiflexion - left;Left foot flat;Wide base of support;Poor foot clearance - left    Ambulation Surface  Level;Indoor    Gait velocity  11.63 = 2.82 ft/sec           Balance Exercises - 03/30/19 0938      Balance Exercises: Standing   Standing Eyes Closed  Narrow base of support (BOS);Foam/compliant surface;3 reps;20 secs   in corner, on 2 pillows    Tandem Stance  Eyes open;Intermittent upper extremity support;2 reps;30 secs;Foam/compliant surface  Other Standing Exercises  Tandem walking forward and backwards on foam balance beam, 3 reps down and back with intermittent UE support at counter.        PT Education - 03/30/19 0934    Education Details  progress towards LTGs    Person(s) Educated  Patient;Spouse    Methods  Explanation    Comprehension  Verbalized understanding       PT Short Term Goals - 03/29/19 1410      PT SHORT TERM GOAL #1   Title  Patient will be independent with initial HEP in order to build upon functional gains made in therapy. ALL STGS DUE AFTER 3 VISITS.    Status  New      PT SHORT TERM GOAL #2   Title  Patient will improve FGA score to at least a 16/30 in order to demonstrate decreased fall risk.    Baseline  22/30 on 03/29/19    Status  Achieved      PT SHORT TERM GOAL #3   Title  Patient's gait speed will improve to at least 2.70 ft/sec in order to demonstrate improved community ambulation.    Baseline  11.63 = 2.82 ft/sec on 03/29/19 - previously 2.45 ft/sec on 03/02/19    Status  Achieved      PT SHORT TERM GOAL #4   Title  Patient will ambulate at least 230' without AD and supervision without any episodes of tripping over/scuffing his L foot in order to demonstrate improved safety while ambulating.    Baseline  met - able to ambulate 230' with no AD with no episodes of LOB/tripping over L foot.    Status  Achieved        PT Long Term Goals - 03/03/19 1039      PT LONG TERM GOAL #1   Title  Patient will be independent with final HEP in order to build upon functional gains made in therapy. ALL LTGS DUE AFTER 9 VISITS.    Baseline  dependent with HEP    Status  New      PT LONG TERM GOAL  #2   Title  Patient will improve FGA score to at least a 20/30 in order to demonstrate decreased fall risk.    Baseline  13/20 on 03/02/19    Status  New      PT LONG TERM GOAL #3   Title  Patient will report 2/10 or less dizzinss after performing gait while scanning environment up/down in order to improve safety during ambulation.    Baseline  currently 4/10 dizziness after performing head nods on FGA      PT LONG TERM GOAL #4   Title  Patient will improve BERG score to at least a 53/56 in order to demonstrate decreased fall risk.    Baseline  49/56    Status  New      PT LONG TERM GOAL #5   Title  Patient will perform 15 steps with step through pattern using single vs. B handrail and mod I in order to safely ascend/descend stairs in his home.    Baseline  not yet assessed    Status  New      Additional Long Term Goals   Additional Long Term Goals  Yes      PT LONG TERM GOAL #6   Title  Patient will improve gait speed to at least 3.4 ft/sec  in order to demo improved community ambulation.    Baseline  2.45 ft/sec on 03/02/19    Status  New        Revised LTGs for re-auth:     PT Long Term Goals - 03/30/19 0948      PT LONG TERM GOAL #1   Title  Patient will be independent with final HEP in order to build upon functional gains made in therapy. ALL LTGS DUE AFTER 9 VISITS.    Baseline  reports that he has started to do HEP, but has not been performing everyday.    Status  On-going      PT LONG TERM GOAL #2   Title  Patient will improve FGA score to at least a 26/30 in order to demonstrate decreased fall risk.    Baseline  22/30 on 03/29/19    Status  Revised      PT LONG TERM GOAL #3   Title  Patient will report 2/10 or less dizzinss after performing gait while scanning environment and after standing balance with eyes closed in order to improve safety during ambulation.    Baseline  currently reports worse dizziness as 5/10    Status  New      PT LONG TERM GOAL #4    Title  Patient will improve BERG score to at least a 53/56 in order to demonstrate decreased fall risk.    Baseline  49/56    Status  New      PT LONG TERM GOAL #5   Title  Patient will perform 15 steps with step through pattern using single vs. B handrail and mod I in order to safely ascend/descend stairs in his home.    Baseline  not yet assessed    Status  New      PT LONG TERM GOAL #6   Title  Patient will improve gait speed to at least 3.4 ft/sec  in order to demo improved community ambulation.    Baseline  2.82 ft/sec on 03/29/19    Status  On-going           03/30/19 0941  Plan  Clinical Impression Statement Focus of today's session was assessing pt's STGs for Medicaid re-auth. This visit was only pt's 2nd visit instead of 3rd due to needing to cancel a previous appointment and one visit with no charge due to high BP. Pt's BP WNL to participate in therapy today. Pt has met all of his STGs today (LTGs revised as appropriate). He has improved his gait speed to 2.82 ft/sec (previously 2.45 ft/sec) and improved his FGA score to a 22/30. Pt continues to report dizziness after performing head turns and while performing balance with his eyes closed. Will further assess at next visit. Pt will continue to benefit from skilled PT in order to address gait abnormalities, impaired dynamic and static balance, and dizziness. Will request 1x week for 7 weeks in order to continue to progress towards LTGs.  Personal Factors and Comorbidities Past/Current Experience;Behavior Pattern;Comorbidity 3+  Comorbidities HTN, polysubstance abuse, tobacco abuse, and prostate cancer.  Examination-Activity Limitations Locomotion Level;Stairs  Examination-Participation Restrictions Community Activity;Yard Work  Pt will benefit from skilled therapeutic intervention in order to improve on the following deficits Abnormal gait;Decreased balance;Decreased safety awareness;Decreased strength;Difficulty walking;Impaired  vision/preception;Dizziness  Clinical Decision Making Low  Rehab Potential Good  PT Frequency 1x / week  PT Duration  (7 weeks)  PT Treatment/Interventions ADLs/Self Care Home Management;Electrical Stimulation;Gait training;Stair training;Functional mobility training;Balance training;Therapeutic exercise;Therapeutic activities;Neuromuscular re-education;Patient/family education;Orthotic Fit/Training;Vestibular;Canalith Repostioning  PT Next Visit Plan vestibular assessment,  how is HEP? corner balance, high level balance.  PT Home Exercise Plan [id number]  Consulted and Agree with Plan of Care Patient;Family member/caregiver  Family Member Consulted pt's wife [name]      Patient will benefit from skilled therapeutic intervention in order to improve the following deficits and impairments:     Visit Diagnosis: Other lack of coordination  Dizziness and giddiness  Other abnormalities of gait and mobility  Unsteadiness on feet     Problem List Patient Active Problem List   Diagnosis Date Noted  . Acute CVA (cerebrovascular accident) ([place]) 02/26/2019  . Polysubstance abuse ([place]) 02/26/2019  . Malignant neoplasm of prostate ([place]) 10/02/2017  . Noncompliance with medication regimen 12/18/2016  . Chronic tension type headache 10/20/2016  . Headache(784.0) 10/19/2013  . Itching 10/19/2013  . Smoker 10/19/2013  . Essential hypertension, benign 07/08/2013  . Pain in joint, shoulder region 07/08/2013  . Cervical disc disorder with radiculopathy of cervical region 07/08/2013  . Dental caries 07/08/2013  . Spinal stenosis in cervical region 07/07/2013  . Tendinitis of left rotator cuff 07/07/2013    Arliss Journey, PT, DPT  03/30/2019, 9:40 AM  [facility] [address] [place], [place], [address] Phone: [phone]   Fax:  [phone]  Name: MARTRELL EGUIA MRN: [id number] Date of Birth: [birth year]

## 2019-04-01 ENCOUNTER — Ambulatory Visit: Payer: Medicaid Other | Admitting: Occupational Therapy

## 2019-04-06 ENCOUNTER — Ambulatory Visit: Payer: Medicaid Other | Admitting: Occupational Therapy

## 2019-04-09 ENCOUNTER — Ambulatory Visit: Payer: Medicaid Other | Admitting: Occupational Therapy

## 2019-04-09 ENCOUNTER — Telehealth: Payer: Self-pay | Admitting: Occupational Therapy

## 2019-04-09 NOTE — Telephone Encounter (Signed)
Therapist spoke with pt. And reviewed attendance policy. Pt was made aware that if he no show's for his next appt. He will be d/c. Pt verbalized understanding.

## 2019-04-13 ENCOUNTER — Ambulatory Visit: Payer: Medicaid Other | Admitting: Occupational Therapy

## 2019-04-13 ENCOUNTER — Other Ambulatory Visit: Payer: Self-pay

## 2019-04-13 ENCOUNTER — Other Ambulatory Visit: Payer: Self-pay | Admitting: *Deleted

## 2019-04-13 ENCOUNTER — Encounter: Payer: Self-pay | Admitting: Occupational Therapy

## 2019-04-13 VITALS — BP 162/96 | HR 78

## 2019-04-13 DIAGNOSIS — M6281 Muscle weakness (generalized): Secondary | ICD-10-CM

## 2019-04-13 DIAGNOSIS — I69354 Hemiplegia and hemiparesis following cerebral infarction affecting left non-dominant side: Secondary | ICD-10-CM

## 2019-04-13 DIAGNOSIS — R278 Other lack of coordination: Secondary | ICD-10-CM

## 2019-04-13 DIAGNOSIS — R41842 Visuospatial deficit: Secondary | ICD-10-CM

## 2019-04-13 DIAGNOSIS — R41840 Attention and concentration deficit: Secondary | ICD-10-CM

## 2019-04-13 DIAGNOSIS — R2681 Unsteadiness on feet: Secondary | ICD-10-CM | POA: Diagnosis not present

## 2019-04-13 DIAGNOSIS — R414 Neurologic neglect syndrome: Secondary | ICD-10-CM

## 2019-04-13 NOTE — Patient Outreach (Signed)
  [facility]) Care Management  04/13/2019  AGEE SERAFINI [birth year] [id number]   Follow up for the EMMI- stroke  RED ON [place] Day # 3 Date: Tuesday 03/02/19 1003  Red Alert Reason: Who reached Patient Feeling worse overall? Yes Listened to these previous topics Medications   Insurance:  Medicaid [place] access [facility] admissions x  1 ED visits x1  in the last 6 months Admit date:02/25/2019 Discharge date:02/26/2019  Unsuccessful call attempts #1  Call to Mr Athas home/mobile number indicates he is not able to receive incoming calls   Call to Mr Ditmer'f fiance's home/mobile number indicates she is not able to receive incoming calls   Plan: [facility] RN CM sent an unsuccessful [facility] engaged patient outreach letter and scheduled this patient for another call attempt within 7-10 business days  Lakasha Mcfall L. Lavina Hamman, RN, BSN, [place] Coordinator Office number [phone] Mobile number [phone]  Main [facility] number [phone] Fax number [phone]

## 2019-04-13 NOTE — Therapy (Signed)
[facility] [address] [place], [place], [address] Phone: [phone]   Fax:  [phone]  Occupational Therapy Treatment  Patient Details  Name: Frank Fischer MRN: [id number] Date of Birth: Nov 29, 1960 Referring Provider (OT): Dr. Eulogio Bear   Encounter Date: 04/13/2019  OT End of Session - 04/13/19 1543    Visit Number  3    Number of Visits  18    Date for OT Re-Evaluation  05/08/19    Authorization Type  Medicaid--approved for 12 visits 03/23/19-05/03/19    Authorization - Visit Number  2    Authorization - Number of Visits  12    OT Start Time  [id number]    OT Stop Time  1324    OT Time Calculation (min)  49 min    Activity Tolerance  Patient tolerated treatment well    Behavior During Therapy  [facility] for tasks assessed/performed       Past Medical History:  Diagnosis Date  . Arthritis   . Cervical stenosis of spine   . Chronic headaches   . Dental caries    periodontal disease  . Frequency of urination   . History of cerebral infarction 11-28-2017  per pt never had symptoms   per MRI imaging 10-31-2016 2 small chronic lacunar infartions in the right frontal periventricular white matter , and chronic left medial orbital fracture (as seen 2014 MRI)  . Hypertension   . Noncompliance with medication regimen   . Prostate cancer [facility]) urologist-  dr winter/  oncologist-  dr Tammi Klippel   dx 07-21-2017--- Stage T1c, Gleason 4+3,  PSA 13.40,  vol 49.7cc--  scheduled for radioactive seed implants 12-03-2017    Past Surgical History:  Procedure Laterality Date  . ABDOMINAL EXPLORATION SURGERY  age 73   per pt had Small Bowel Resection and Appendectomy  . APPENDECTOMY    . ARM SURGERY Left 1999   REPAIR INJURY-- "ARM CUT ABOUT OFF"  . PROSTATE BIOPSY  07/21/2017  dr winter office  . RADIOACTIVE SEED IMPLANT N/A 12/03/2017   Procedure: RADIOACTIVE SEED IMPLANT/BRACHYTHERAPY IMPLANT;  Surgeon: Ceasar Mons, MD;  Location: [facility];  Service: Urology;  Laterality: N/A;  ONLY NEEDS 120 MIN  . SPACE OAR INSTILLATION N/A 12/03/2017   Procedure: SPACE OAR INSTILLATION;  Surgeon: Ceasar Mons, MD;  [facility]  Service: Urology;  Laterality: N/A;  . TOOTH EXTRACTION N/A 09/21/2018   Procedure: DENTAL EXTRACTIONS WITH ALVEOLIPLASTY;  Surgeon: Diona Browner, DDS;  Location: [facility];  Service: Oral Surgery;  Laterality: N/A;    Vitals:   04/13/19 1236  BP: (!) 162/96  Pulse: 78    Subjective Assessment - 04/13/19 1236    Subjective   doing ok, reports that he is not bumping into things at home    Patient is accompanied by:  Family member    Currently in Pain?  Yes    Pain Score  4     Pain Location  Hand    Pain Orientation  Right;Left    Pain Descriptors / Indicators  Aching    Pain Type  Chronic pain    Pain Onset  More than a month ago    Pain Frequency  Constant    Aggravating Factors   nothing    Pain Relieving Factors  nothing        Environmental scanning in minimally distracting environment with 12/15 items found.  Pt missed 2/3 items on  L side.  Min v.c. to slow down, but pt able to find remaining on 2nd pass.  Constant Therapy Scanning for Symbols level 5 with 85% accuracy and level 8 with 79% accuracy.  Copying small peg design with min difficulty, particularly with in-hand manipulation for coordination and visual scanning with mod cues (approx 60% accuracy with missing pegs on L side of the design).  Matching clock faces to digital times with min cueing for visual scanning and accuracy with telling time/to double check himself   Reviewed Medicaid end date and importance of coming to therapy for progress.     Recommended that pt/wife notify PCP of BP and that it has been running high at home.    OT Education - 04/13/19 1241    Education Details  Visual HEP and Compensation Strategies--see pt instructions     Person(s) Educated  Patient;Spouse    Methods  Explanation;Demonstration;Verbal cues;Handout    Comprehension  Verbalized understanding;Returned demonstration;Verbal cues required       OT Short Term Goals - 03/02/19 1613      OT SHORT TERM GOAL #1   Title  Pt will be independent with HEP for LUE coordination/strength.--check STGs 04/01/19    Baseline  no HEP/dependent    Time  4    Period  Weeks    Status  New      OT SHORT TERM GOAL #2   Title  Pt will improve LUE coordination for ADLs as shown by improving time on 9-hole peg test to 60sec or less.    Baseline  77sec    Time  4    Period  Weeks    Status  New      OT SHORT TERM GOAL #3   Title  Pt will improve L grip strength by at least 8lbs to assist with opening containers.    Baseline  20.5    Time  4    Period  Weeks    Status  New      OT SHORT TERM GOAL #4   Title  Pt will be able to use LUE as nondominant assist consistently for BADLs (including pulling up underwear and donning socks).    Baseline  unable to pull up underwear with LUE, unable to cut food with fork/knife, and unable to don socks    Time  4    Period  Weeks    Status  New      OT SHORT TERM GOAL #5   Title  Pt will perform simple tabletop visual scanning with at least 75% accuracy.    Baseline  45%, did not scan to L of midline    Time  4    Period  Weeks    Status  New        OT Long Term Goals - 03/02/19 1620      OT LONG TERM GOAL #1   Title  Pt will be indepenent with compensation strategies for visual deficits and visual HEP.--check LTGs 05/08/19    Baseline  not scanning to L of midline with reading, bumping into items on L side, no visual HEP/dependent    Time  8    Period  Weeks    Status  New      OT LONG TERM GOAL #2   Title  Pt will improve LUE coordination for ADLs as shown by improving time on 9-hole peg test to 45sec or less.    Baseline  77sec    Time  8    Period  Weeks    Status  New      OT LONG TERM GOAL #3    Title  Pt will improve L grip strength by at least 16bs to assist with opening containers.    Baseline  20.5lbs    Time  8    Period  Weeks    Status  New      OT LONG TERM GOAL #4   Title  Pt will be able to retrieve 3lb object from overhead shelf with LUE demonstrating good control/safety.    Baseline  decr control/safety, not using LUE for functional reach    Time  8    Period  Weeks    Status  New      OT LONG TERM GOAL #5   Title  Pt will perform simple tabletop visual scanning with at least 90% accuracy.    Baseline  approx 45% accuracy    Time  8    Period  Weeks    Status  New      OT LONG TERM GOAL #6   Title  Pt will perform simple environmental scanning with at least 80% accuracy for incr safety in home and community.    Baseline  frequently bumping into things on L side within the home, only 45% visual scanning tabletop level, pt did not look to L unless cued    Time  8    Period  Weeks    Status  New      OT LONG TERM GOAL #7   Title  Pt will be able to complete simple cooking task with supervision.    Baseline  not performing, decr safety due to decr LUE functional use and visual field deficit    Time  8    Period  Weeks    Status  New            Plan - 04/13/19 1241    Clinical Impression Statement  Pt is progressing towards goals with improved visual scanning,  but continues with needing cueing and to reinforce/utilize compensation strategies.    OT Occupational Profile and History  Detailed Assessment- Review of Records and additional review of physical, cognitive, psychosocial history related to current functional performance    Occupational performance deficits (Please refer to evaluation for details):  ADL's;IADL's;Leisure;Social Participation    Body Structure / Function / Physical Skills  ADL;Dexterity;ROM;Vision;IADL;Balance;Sensation;Strength;FMC;Coordination;UE functional use;GMC;Decreased knowledge of use of DME    Cognitive Skills   Attention;Perception    Rehab Potential  Good    Clinical Decision Making  Several treatment options, min-mod task modification necessary    Comorbidities Affecting Occupational Performance:  May have comorbidities impacting occupational performance    Modification or Assistance to Complete Evaluation   Min-Moderate modification of tasks or assist with assess necessary to complete eval    OT Frequency  2x / week    OT Duration  --   9 weeks +eval (or 17 visits +eval)   OT Treatment/Interventions  Self-care/ADL training;Moist Heat;DME and/or AE instruction;Balance training;Therapeutic activities;Therapeutic exercise;Cognitive remediation/compensation;Visual/perceptual remediation/compensation;Passive range of motion;Functional Mobility Training;Neuromuscular education;Cryotherapy;Energy conservation;Manual Therapy;Patient/family education    Plan  continue to address LUE coordination/strength, functional use and visual scanning, check BP    Consulted and Agree with Plan of Care  Patient;Family member/caregiver    Family Member Consulted  wife--[name]       Patient will benefit from skilled therapeutic intervention in order to improve the following deficits and impairments:  Body Structure / Function / Physical Skills: ADL, Dexterity, ROM, Vision, IADL, Balance, Sensation, Strength, FMC, Coordination, UE functional use, GMC, Decreased knowledge of use of DME Cognitive Skills: Attention, Perception     Visit Diagnosis: Other lack of coordination  Neurologic neglect syndrome  Attention and concentration deficit  Visuospatial deficit  Hemiplegia and hemiparesis following cerebral infarction affecting left non-dominant side (HCC)  Muscle weakness (generalized)  Unsteadiness on feet    Problem List Patient Active Problem List   Diagnosis Date Noted  . Acute CVA (cerebrovascular accident) ([place]) 02/26/2019  . Polysubstance abuse ([place]) 02/26/2019  . Malignant neoplasm of prostate  ([place]) 10/02/2017  . Noncompliance with medication regimen 12/18/2016  . Chronic tension type headache 10/20/2016  . Headache(784.0) 10/19/2013  . Itching 10/19/2013  . Smoker 10/19/2013  . Essential hypertension, benign 07/08/2013  . Pain in joint, shoulder region 07/08/2013  . Cervical disc disorder with radiculopathy of cervical region 07/08/2013  . Dental caries 07/08/2013  . Spinal stenosis in cervical region 07/07/2013  . Tendinitis of left rotator cuff 07/07/2013    [facility] 04/13/2019, 3:56 PM  [facility] [address] [place], [place], [address] Phone: [phone]   Fax:  [phone]  Name: JAMARKIS WELLMANN MRN: [id number] Date of Birth: [birth year]

## 2019-04-13 NOTE — Patient Instructions (Addendum)
   Compensation strategies: 1. Look for the edge of objects (to the left and/or right) so that you make sure you are seeing all of an object 2. Turn your head when walking, scan from side to side, particularly in busy environments and have someone with you for safety. 3. Use an organized scanning pattern. It's usually easier to scan from top to bottom, and left to right (like you are reading) 4. Double check yourself 5. Use a line guide (like a blank piece of paper) or your finger when reading 6. If necessary, place brightly colored tape at end of table or work area as a reminder to always look until you see the tape.  7. Large print will be easier to see. 8. Organize items in baskets and/or with labels so they are easier to see/find 9. Remove clutter 10. Make sure you have good lighting. 11. Consider using contrast/brightly colored strips on steps to make it easier to see the edge.   Visual Activities: 1. Simple word search 2. Read simple paragraphs/bible verses out loud 3. With someone with you, look for items in grocery store 4. Work simple jigsaw puzzles 5. Play simple matching games online/tablet that make you search visually. 6.  Play board/card games (memory/matching, solitaire, connect 4, etc.) 7.  Scavenger hunt activity (look for playing cards, sticky notes, etc, red items, etc)

## 2019-04-16 ENCOUNTER — Encounter: Payer: Medicaid Other | Admitting: Occupational Therapy

## 2019-04-19 ENCOUNTER — Ambulatory Visit: Payer: Medicaid Other | Admitting: Physical Therapy

## 2019-04-20 ENCOUNTER — Ambulatory Visit: Payer: Medicaid Other | Admitting: Occupational Therapy

## 2019-04-20 ENCOUNTER — Other Ambulatory Visit: Payer: Self-pay

## 2019-04-20 ENCOUNTER — Other Ambulatory Visit: Payer: Self-pay | Admitting: *Deleted

## 2019-04-20 NOTE — Patient Outreach (Addendum)
[facility]) Care Management  04/20/2019  Frank Fischer 02/09/1961 [id number]   EMMI- stroke- follow up call and case closure   Previous EMMI Alert Date:   Friday 03/12/19 [place] Reason:  Feeling worse overall? Yes Went to follow up appointment? No   Insurance:Medicaid [place] access [facility] admissions x1ED visits x 1in the last 6 months Admit date:02/25/2019 Discharge date:02/26/2019  Outreach attempt successful  Patient is able to verify HIPAA, DOB and address [facility] Care Management RN reviewed and addressed EMMI f/u with Mr [name]  EMMI: No further reported worsening s/s He will be going to an outpatient therapy session today   [name] reports he is doing well except he has had some "arthritis in his arm He reports he had difficulty with his outpatient therapies but today 04/20/19 at 1315 he will be attending a session. He and [facility] RN CM discussed the benefit of outpatient therapy for him and him making the therapy staff aware of his concerns with only his arm to get the best out of his session. He agrees to reach out to CM if he is having concerns with therapy.   HTN he is taking his BP with the BP cuff sent to him from [facility] and reports his BP is generally been "normal" but with some noted diastolic elevations in the 100s. He reports he is aware this may be related to "eating" He reports variations of he diet habits. [facility] RN CM discussed other causes of diastolic BP elevations with him He reports a present wt of 212 lbs  Mr [name] confirmed completion of  [facility] referral   He confirms his next f/u with his primary MD is in January 2021 [facility] RN CM discussed action plans of contacting the primary MD office for continued arthritis pain in his arm,increase weights and increased BPs.  He voiced he would  [facility] RN CM discussed [facility] case closure and availability to return call to [facility] RN CM prn  He voiced understanding and appreciation of [place] RN CM will close case at this time as patient has been assessed and no needs identified/needs resolved.   Pt encouraged to return a call to [facility] RN CM prn  Case closure letters sent to patient and primary MD  [facility] CM Care Plan Problem One     Most Recent Value  Care Plan Problem One  Risk for re admission related to recent stroke  Role Documenting the Problem One  Care Management Telephonic Coordinator  Care Plan for Problem One  Active  [facility] Long Term Goal   over the next 30 days patient will voice understanding of 2-3 interventions to manage his CVA, HTN during follow up calls  [facility] Long Term Goal Start Date  03/04/19  [facility] CM Short Term Goal #1   over the next 10 days patient will have [facility] SW contact to review smoke cessation and advance directive resources as verbalized during follow up call  [facility] CM Short Term Goal #1 Start Date  03/04/19  [facility] CM Short Term Goal #1 Met Date  04/20/19  [facility] CM Short Term Goal #2   over the next 14 days patient will verbalize 2 interventions to manage CVA and HTN at home during follow up call  [facility] CM Short Term Goal #2 Start Date  03/04/19  [facility] CM Short Term Goal #2 Met Date  04/20/19       Frank Fischer L. Frank Hamman, RN, BSN, CCM [facility] Telephonic  Care Management Care Coordinator Office number [phone] Mobile number [phone]  Main [facility] number [phone] Fax number [phone]

## 2019-04-22 ENCOUNTER — Ambulatory Visit: Payer: Medicaid Other | Admitting: Occupational Therapy

## 2019-04-23 ENCOUNTER — Ambulatory Visit: Payer: Medicaid Other | Admitting: Occupational Therapy

## 2019-04-23 ENCOUNTER — Ambulatory Visit: Payer: Medicaid Other | Attending: Internal Medicine | Admitting: Occupational Therapy

## 2019-04-23 ENCOUNTER — Ambulatory Visit: Payer: Medicaid Other

## 2019-04-27 ENCOUNTER — Telehealth: Payer: Self-pay | Admitting: Neurology

## 2019-04-27 ENCOUNTER — Institutional Professional Consult (permissible substitution): Payer: Medicaid Other | Admitting: Neurology

## 2019-04-27 ENCOUNTER — Telehealth: Payer: Self-pay | Admitting: *Deleted

## 2019-04-27 ENCOUNTER — Ambulatory Visit: Payer: Medicaid Other | Admitting: Occupational Therapy

## 2019-04-27 NOTE — Telephone Encounter (Signed)
Pt no showed hosp f/u appt.

## 2019-04-27 NOTE — Telephone Encounter (Signed)
Patient no-showed today. He has 20% no shows and he has been no-showing for therapy as well. If he calls back to reschedule he can see Janett Billow since he was seen by Dr. Erlinda Hong in the hospital. thanks

## 2019-04-27 NOTE — Telephone Encounter (Signed)
Noted! Thank you

## 2019-04-29 ENCOUNTER — Telehealth: Payer: Self-pay | Admitting: Physical Therapy

## 2019-04-29 ENCOUNTER — Ambulatory Visit: Payer: Medicaid Other | Admitting: Physical Therapy

## 2019-04-29 NOTE — Telephone Encounter (Signed)
PT called pt after no showing session today at 10:15. Pt did not answer, therapist left a voicemail reviewing attendance policy and that he if does not show up again for his next session (12/11 at 1:15 PM)  he will be discharged.   Janann August, PT, DPT 04/29/19 10:36 AM

## 2019-04-30 ENCOUNTER — Ambulatory Visit: Payer: Medicaid Other | Admitting: Physical Therapy

## 2019-05-04 ENCOUNTER — Ambulatory Visit: Payer: Medicaid Other | Admitting: Occupational Therapy

## 2019-05-04 ENCOUNTER — Encounter: Payer: Self-pay | Admitting: Neurology

## 2019-05-07 ENCOUNTER — Encounter: Payer: Medicaid Other | Admitting: Occupational Therapy

## 2019-05-07 ENCOUNTER — Ambulatory Visit: Payer: Medicaid Other | Admitting: Physical Therapy

## 2019-05-10 ENCOUNTER — Encounter: Payer: Self-pay | Admitting: Occupational Therapy

## 2019-05-10 NOTE — Therapy (Signed)
[facility] [address] [place], [place], [address] Phone: [phone]   Fax:  [phone]  Patient Details  Name: Frank Fischer MRN: [id number] Date of Birth: [birth year] Referring Provider:  No ref. provider found  Encounter Date: 05/10/2019  OCCUPATIONAL THERAPY DISCHARGE SUMMARY  Visits from Start of Care: 3  Current functional level related to goals / functional outcomes: OT Short Term Goals - 03/02/19 1613      OT SHORT TERM GOAL #1   Title  Pt will be independent with HEP for LUE coordination/strength.--check STGs 04/01/19    Baseline  no HEP/dependent    Time  4    Period  Weeks    Status  instructed in initial putty/coordination HEP only/not fully met     OT SHORT TERM GOAL #2   Title  Pt will improve LUE coordination for ADLs as shown by improving time on 9-hole peg test to 60sec or less.    Baseline  77sec    Time  4    Period  Weeks    Status Not met/unable to reassess     OT SHORT TERM GOAL #3   Title  Pt will improve L grip strength by at least 8lbs to assist with opening containers.    Baseline  20.5    Time  4    Period  Weeks    Status Not met/unable to reassess     OT SHORT TERM GOAL #4   Title  Pt will be able to use LUE as nondominant assist consistently for BADLs (including pulling up underwear and donning socks).    Baseline  unable to pull up underwear with LUE, unable to cut food with fork/knife, and unable to don socks    Time  4    Period  Weeks    Status  Not met/unable to reassess     OT SHORT TERM GOAL #5   Title  Pt will perform simple tabletop visual scanning with at least 75% accuracy.    Baseline  45%, did not scan to L of midline    Time  4    Period  Weeks    Status  improved, but not consistent     OT Long Term Goals - 03/02/19 1620      OT LONG TERM GOAL #1   Title  Pt will be indepenent with compensation strategies for visual deficits and visual HEP.--check  LTGs 05/08/19    Baseline  not scanning to L of midline with reading, bumping into items on L side, no visual HEP/dependent    Time  8    Period  Weeks    Status Instructed, but did not return/not fully met     OT LONG TERM GOAL #2   Title  Pt will improve LUE coordination for ADLs as shown by improving time on 9-hole peg test to 45sec or less.    Baseline  77sec    Time  8    Period  Weeks    Status Not met/unable to reassess     OT LONG TERM GOAL #3   Title  Pt will improve L grip strength by at least 16bs to assist with opening containers.    Baseline  20.5lbs    Time  8    Period  Weeks    Status Not met/unable to reassess     OT LONG TERM GOAL #4   Title  Pt will be able to retrieve 3lb  object from overhead shelf with LUE demonstrating good control/safety.    Baseline  decr control/safety, not using LUE for functional reach    Time  8    Period  Weeks    Status  Not met/unable to reassess     OT LONG TERM GOAL #5   Title  Pt will perform simple tabletop visual scanning with at least 90% accuracy.    Baseline  approx 45% accuracy    Time  8    Period  Weeks    Status  Not met     OT LONG TERM GOAL #6   Title  Pt will perform simple environmental scanning with at least 80% accuracy for incr safety in home and community.    Baseline  frequently bumping into things on L side within the home, only 45% visual scanning tabletop level, pt did not look to L unless cued    Time  8    Period  Weeks    Status  Not fully met (80% x1)     OT LONG TERM GOAL #7   Title  Pt will be able to complete simple cooking task with supervision.    Baseline  not performing, decr safety due to decr LUE functional use and visual field deficit    Time  8    Period  Weeks    Status Not met/unable to reassess        Remaining deficits: Pt continues to demo decr strength, visuospatial deficits, decr coordination.  All goals not met/unable to reassess due to pt not returning to OT.    Education / Equipment: Pt instructed in coordination/hand strength HEP and visual compensation strategies, however, pt education not completed due to pt not returning to OT.   Plan: Patient agrees to discharge.  Patient goals were not met. Patient is being discharged due to not returning since the last visit.  ?????(multiple no shows/cancellations)      Vianne Bulls 05/10/2019, 10:08 AM  [facility] [address] [place], [place], [address] Phone: [phone]   Fax:  [place], OTR/L [facility] [address] [place], [place]  [address] [phone] phone [phone] 05/10/19 10:14 AM

## 2019-05-11 ENCOUNTER — Ambulatory Visit: Payer: Medicaid Other | Admitting: Physical Therapy

## 2019-05-11 ENCOUNTER — Encounter: Payer: Medicaid Other | Admitting: Occupational Therapy

## 2019-05-18 ENCOUNTER — Ambulatory Visit: Payer: Medicaid Other | Admitting: Physical Therapy

## 2019-05-18 ENCOUNTER — Encounter: Payer: Medicaid Other | Admitting: Occupational Therapy

## 2019-06-02 ENCOUNTER — Inpatient Hospital Stay: Payer: Medicaid Other | Admitting: Adult Health

## 2019-06-15 ENCOUNTER — Emergency Department (HOSPITAL_COMMUNITY)
Admission: EM | Admit: 2019-06-15 | Discharge: 2019-06-15 | Disposition: A | Discharge status: Home / Self Care | Payer: Medicaid Other | Attending: Emergency Medicine | Admitting: Emergency Medicine

## 2019-06-15 ENCOUNTER — Other Ambulatory Visit: Payer: Self-pay

## 2019-06-15 ENCOUNTER — Encounter (HOSPITAL_COMMUNITY): Payer: Self-pay | Admitting: *Deleted

## 2019-06-15 DIAGNOSIS — Z7982 Long term (current) use of aspirin: Secondary | ICD-10-CM | POA: Diagnosis not present

## 2019-06-15 DIAGNOSIS — Z7902 Long term (current) use of antithrombotics/antiplatelets: Secondary | ICD-10-CM | POA: Insufficient documentation

## 2019-06-15 DIAGNOSIS — I10 Essential (primary) hypertension: Secondary | ICD-10-CM | POA: Insufficient documentation

## 2019-06-15 DIAGNOSIS — R339 Retention of urine, unspecified: Secondary | ICD-10-CM | POA: Insufficient documentation

## 2019-06-15 DIAGNOSIS — Z79899 Other long term (current) drug therapy: Secondary | ICD-10-CM | POA: Insufficient documentation

## 2019-06-15 DIAGNOSIS — F17210 Nicotine dependence, cigarettes, uncomplicated: Secondary | ICD-10-CM | POA: Insufficient documentation

## 2019-06-15 DIAGNOSIS — R300 Dysuria: Secondary | ICD-10-CM | POA: Diagnosis present

## 2019-06-15 DIAGNOSIS — C61 Malignant neoplasm of prostate: Secondary | ICD-10-CM | POA: Insufficient documentation

## 2019-06-15 LAB — CBC WITH DIFFERENTIAL/PLATELET
Abs Immature Granulocytes: 0.04 10*3/uL (ref 0.00–0.07)
Basophils Absolute: 0.1 10*3/uL (ref 0.0–0.1)
Basophils Relative: 1 %
Eosinophils Absolute: 0.1 10*3/uL (ref 0.0–0.5)
Eosinophils Relative: 1 %
HCT: 45.5 % (ref 39.0–52.0)
Hemoglobin: 15.2 g/dL (ref 13.0–17.0)
Immature Granulocytes: 1 %
Lymphocytes Relative: 27 %
Lymphs Abs: 1.8 10*3/uL (ref 0.7–4.0)
MCH: 32.7 pg (ref 26.0–34.0)
MCHC: 33.4 g/dL (ref 30.0–36.0)
MCV: 97.8 fL (ref 80.0–100.0)
Monocytes Absolute: 0.6 10*3/uL (ref 0.1–1.0)
Monocytes Relative: 9 %
Neutro Abs: 4 10*3/uL (ref 1.7–7.7)
Neutrophils Relative %: 61 %
Platelets: 280 10*3/uL (ref 150–400)
RBC: 4.65 MIL/uL (ref 4.22–5.81)
RDW: 15.2 % (ref 11.5–15.5)
WBC: 6.5 10*3/uL (ref 4.0–10.5)
nRBC: 0 % (ref 0.0–0.2)

## 2019-06-15 LAB — COMPREHENSIVE METABOLIC PANEL
ALT: 24 U/L (ref 0–44)
AST: 25 U/L (ref 15–41)
Albumin: 4.1 g/dL (ref 3.5–5.0)
Alkaline Phosphatase: 91 U/L (ref 38–126)
Anion gap: 9 (ref 5–15)
BUN: 14 mg/dL (ref 6–20)
CO2: 24 mmol/L (ref 22–32)
Calcium: 8.9 mg/dL (ref 8.9–10.3)
Chloride: 107 mmol/L (ref 98–111)
Creatinine, Ser: 1.27 mg/dL — ABNORMAL HIGH (ref 0.61–1.24)
GFR calc Af Amer: >60 mL/min (ref >60)
GFR calc non Af Amer: >60 mL/min (ref >60)
Glucose, Bld: 115 mg/dL — ABNORMAL HIGH (ref 70–99)
Potassium: 4.3 mmol/L (ref 3.5–5.1)
Sodium: 140 mmol/L (ref 135–145)
Total Bilirubin: 0.7 mg/dL (ref 0.3–1.2)
Total Protein: 7.1 g/dL (ref 6.5–8.1)

## 2019-06-15 LAB — URINALYSIS, ROUTINE W REFLEX MICROSCOPIC
Bacteria, UA: NONE SEEN
Bilirubin Urine: NEGATIVE
Glucose, UA: NEGATIVE mg/dL
Ketones, ur: NEGATIVE mg/dL
Nitrite: NEGATIVE
Protein, ur: 100 mg/dL — AB
RBC / HPF: >50 RBC/hpf — ABNORMAL HIGH (ref 0–5)
Specific Gravity, Urine: 1.021 (ref 1.005–1.030)
WBC, UA: >50 WBC/hpf — ABNORMAL HIGH (ref 0–5)
pH: 5 (ref 5.0–8.0)

## 2019-06-15 MED ORDER — ONDANSETRON HCL 4 MG/2ML IJ SOLN
4.0000 mg | Freq: Once | INTRAMUSCULAR | Status: AC
  Administered 2019-06-15: 14:00:00 4 mg via INTRAVENOUS
  Filled 2019-06-15: qty 2

## 2019-06-15 MED ORDER — HYDROMORPHONE HCL 1 MG/ML IJ SOLN
1.0000 mg | Freq: Once | INTRAMUSCULAR | Status: AC
  Administered 2019-06-15: 1 mg via INTRAVENOUS
  Filled 2019-06-15: qty 1

## 2019-06-15 MED ORDER — CIPROFLOXACIN HCL 500 MG PO TABS: 500.0000 mg | ORAL_TABLET | Freq: Two times a day (BID) | ORAL | 0 refills | Status: DC

## 2019-06-15 MED ORDER — LORAZEPAM 2 MG/ML IJ SOLN
0.5000 mg | Freq: Once | INTRAMUSCULAR | Status: AC
  Administered 2019-06-15: 0.5 mg via INTRAVENOUS
  Filled 2019-06-15: qty 1

## 2019-06-15 NOTE — ED Notes (Signed)
NT attempted regular catheter, unable to advance past prostate. RN attempted Coude catheter, unable still to advance past the prostate

## 2019-06-15 NOTE — Consult Note (Signed)
I have been asked to see the patient by Dr. Milton Ferguson, for evaluation and management of urinary retention.  History of present illness: 59-year-old male with a history of prostate cancer treated with brachytherapy in 2019.  Over the course of the last week the patient has had dysuria and painful urination.  He feels as if he has had incomplete emptying of his bladder.  His stream is weak he has had urinary urgency.  Starting late last night the patient has been unable to void.  In the emergency department nursing staff on 2 separate occasions attempted to pass Foley catheter.  They were on successful.  Urology was consulted.  Review of systems: A 12 point comprehensive review of systems was obtained and is negative unless otherwise stated in the history of present illness.  Patient Active Problem List   Diagnosis Date Noted  . Acute CVA (cerebrovascular accident) ([place]) 02/26/2019  . Polysubstance abuse ([place]) 02/26/2019  . Malignant neoplasm of prostate ([place]) 10/02/2017  . Noncompliance with medication regimen 12/18/2016  . Chronic tension type headache 10/20/2016  . Headache(784.0) 10/19/2013  . Itching 10/19/2013  . Smoker 10/19/2013  . Essential hypertension, benign 07/08/2013  . Pain in joint, shoulder region 07/08/2013  . Cervical disc disorder with radiculopathy of cervical region 07/08/2013  . Dental caries 07/08/2013  . Spinal stenosis in cervical region 07/07/2013  . Tendinitis of left rotator cuff 07/07/2013    No current facility-administered medications on file prior to encounter.   Current Outpatient Medications on File Prior to Encounter  Medication Sig Dispense Refill  . albuterol (VENTOLIN HFA) 108 (90 Base) MCG/ACT inhaler Inhale 2 puffs into the lungs every 6 (six) hours as needed for wheezing or shortness of breath.     Marland Kitchen aspirin EC 325 MG EC tablet Take 1 tablet (325 mg total) by mouth daily. 30 tablet 0  . atorvastatin (LIPITOR) 80 MG tablet Take 1 tablet (80 mg  total) by mouth daily at 6 PM. 30 tablet 0  . lisinopril (ZESTRIL) 40 MG tablet Take 1 tablet (40 mg total) by mouth every morning.    . clopidogrel (PLAVIX) 75 MG tablet Take 1 tablet (75 mg total) by mouth daily. (Patient not taking: Reported on 06/15/2019) 30 tablet 2    Past Medical History:  Diagnosis Date  . Arthritis   . Cervical stenosis of spine   . Chronic headaches   . Dental caries    periodontal disease  . Frequency of urination   . History of cerebral infarction 11-28-2017  per pt never had symptoms   per MRI imaging 10-31-2016 2 small chronic lacunar infartions in the right frontal periventricular white matter , and chronic left medial orbital fracture (as seen 2014 MRI)  . Hypertension   . Noncompliance with medication regimen   . Prostate cancer [facility]) urologist-  dr winter/  oncologist-  dr Tammi Klippel   dx 07-21-2017--- Stage T1c, Gleason 4+3,  PSA 13.40,  vol 49.7cc--  scheduled for radioactive seed implants 12-03-2017    Past Surgical History:  Procedure Laterality Date  . ABDOMINAL EXPLORATION SURGERY  age 89   per pt had Small Bowel Resection and Appendectomy  . APPENDECTOMY    . ARM SURGERY Left 1999   REPAIR INJURY-- "ARM CUT ABOUT OFF"  . PROSTATE BIOPSY  07/21/2017  dr winter office  . RADIOACTIVE SEED IMPLANT N/A 12/03/2017   Procedure: RADIOACTIVE SEED IMPLANT/BRACHYTHERAPY IMPLANT;  Surgeon: Ceasar Mons, MD;  Location: [facility];  Service:  Urology;  Laterality: N/A;  ONLY NEEDS 120 MIN  . SPACE OAR INSTILLATION N/A 12/03/2017   Procedure: SPACE OAR INSTILLATION;  Surgeon: Ceasar Mons, MD;  Location[facility]  Service: Urology;  Laterality: N/A;  . TOOTH EXTRACTION N/A 09/21/2018   Procedure: DENTAL EXTRACTIONS WITH ALVEOLIPLASTY;  Surgeon: Diona Browner, DDS;  Location: [place];  Service: Oral Surgery;  Laterality: N/A;    Social History   Tobacco Use  . Smoking status: Current Every Day Smoker     Packs/day: 0.50    Years: 40.00    Pack years: 20.00    Types: Cigarettes  . Smokeless tobacco: Never Used  Substance Use Topics  . Alcohol use: Yes    Alcohol/week: 21.0 standard drinks    Types: 21 Cans of beer per week    Comment: 1-2 12oz  beers every evening  . Drug use: Yes    Types: Marijuana    Comment:  per pt uses once week    Family History  Problem Relation Age of Onset  . Cancer Mother   . Diabetes Paternal Grandfather     PE: Vitals:   06/15/19 1337 06/15/19 1340 06/15/19 1659  BP: (!) 157/101  (!) 156/97  Pulse: 71  80  Resp: 20  18  Temp: 97.7 F (36.5 C)    TempSrc: Oral    SpO2: 100%  95%  Weight:  90.7 kg   Height:  5' 10"  (1.778 m)    Patient appears to be in no acute distress  patient is alert and oriented x3 Atraumatic normocephalic head No cervical or supraclavicular lymphadenopathy appreciated No increased work of breathing, no audible wheezes/rhonchi Regular sinus rhythm/rate Abdomen is soft, nontender, nondistended, no CVA or suprapubic tenderness Lower extremities are symmetric without appreciable edema Grossly neurologically intact No identifiable skin lesions  Recent Labs    06/15/19 1355  WBC 6.5  HGB 15.2  HCT 45.5   Recent Labs    06/15/19 1355  NA 140  K 4.3  CL 107  CO2 24  GLUCOSE 115*  BUN 14  CREATININE 1.27*  CALCIUM 8.9   No results for input(s): LABPT, INR in the last 72 hours. No results for input(s): LABURIN in the last 72 hours. Results for orders placed or performed during the hospital encounter of 02/25/19  SARS CORONAVIRUS 2 (TAT 6-24 HRS) Nasopharyngeal Nasopharyngeal Swab     Status: None   Collection Time: 02/26/19 12:20 AM   Specimen: Nasopharyngeal Swab  Result Value Ref Range Status   SARS Coronavirus 2 NEGATIVE NEGATIVE Final    Comment: (NOTE) SARS-CoV-2 target nucleic acids are NOT DETECTED. The SARS-CoV-2 RNA is generally detectable in upper and lower respiratory specimens during the  acute phase of infection. Negative results do not preclude SARS-CoV-2 infection, do not rule out co-infections with other pathogens, and should not be used as the sole basis for treatment or other patient management decisions. Negative results must be combined with clinical observations, patient history, and epidemiological information. The expected result is Negative. Fact Sheet for Patients: SugarRoll.be Fact Sheet for Healthcare Providers: https://www.woods-mathews.com/ This test is not yet approved or cleared by the [place] FDA and  has been authorized for detection and/or diagnosis of SARS-CoV-2 by FDA under an Emergency Use Authorization (EUA). This EUA will remain  in effect (meaning this test can be used) for the duration of the COVID-19 declaration under Section 56 4(b)(1) of the Act, 21 U.S.C. section 360bbb-3(b)(1), unless the authorization is  terminated or revoked sooner. Performed at [facility], [place] [address] [place], [place] [address]    Procedure: Using the standard sterile technique after injecting lidocaine jelly into the patient's urethra attempted to pass a 18 French coud tip Foley catheter into the patient's bladder.  I met some resistance and at the point the patient was also having some pain.  I withdrew the catheter noted blood at the tip.  As such, I opted not to proceed any further and instead attempt catheter placement with cystoscopy.  Imp: The patient has acute urinary retention, he was difficult Foley catheter.  Ultimately, the catheter was placed, was unable to obtain the total volume because the leaks quite a bit around the catheter at the time of the placement.  Recommendations: The catheter should be left for about a week.  I recommended that the patient be sent home on ciprofloxacin and a urine culture be sent and followed up.   Ardis Hughs

## 2019-06-15 NOTE — ED Triage Notes (Signed)
Pt has not been able to urinate since last night,

## 2019-06-15 NOTE — ED Notes (Signed)
Urology cart placed outside of room

## 2019-06-15 NOTE — Procedures (Signed)
Pre-procedure diagnosis: difficult foley catheterization Post-procedure diagnosis: as above  Surgeon: Dr. Ardis Hughs  Findings: False passage of the membranous urethra Specimen: Sterile urine culture  Drains: 19 F council tipped foley  Indications:  Patient unable to void on his own.  Nursing staff have been unsuccessful at placing catheter.  Procedure:  Gentials were prepped and draped in the routine sterile fashion.  10cc of 1% viscous lidocaine jelly was then injected into the patient's urethra.  A 16 French flexible cystoscope was gently passed to the patient's urethra and the bladder with the above findings.  I then advanced a sensor wire through the cystoscope and into the patient's bladder.  I then remove the cystoscope over the wire.  I subsequently advanced an 61 French catheter over the wire and into the patient's bladder.  Urine was then noted on return.  A sample was collected and 10 cc of sterile water was instilled into the patient's balloon.  The catheter bag was the hooked up to the patient's catheter.  He tolerated the procedure without any difficulty.

## 2019-06-15 NOTE — ED Provider Notes (Addendum)
[facility] DEPT Provider Note   CSN: [id number] Arrival date & time: 06/15/19  1330     History Chief Complaint  Patient presents with  . Dysuria    Frank Fischer is a 59 y.o. male.  Patient with abdominal pain.  Patient has not urinated since last night.  Patient has a history of prostate cancer.  The history is provided by the patient.  Abdominal Pain Pain location:  Generalized Pain quality: aching   Pain radiates to:  Does not radiate Pain severity:  Moderate Onset quality:  Sudden Timing:  Constant Progression:  Worsening Chronicity:  New Context: not alcohol use   Relieved by:  Nothing Associated symptoms: no chest pain, no cough, no diarrhea, no fatigue and no hematuria        Past Medical History:  Diagnosis Date  . Arthritis   . Cervical stenosis of spine   . Chronic headaches   . Dental caries    periodontal disease  . Frequency of urination   . History of cerebral infarction 11-28-2017  per pt never had symptoms   per MRI imaging 10-31-2016 2 small chronic lacunar infartions in the right frontal periventricular white matter , and chronic left medial orbital fracture (as seen 2014 MRI)  . Hypertension   . Noncompliance with medication regimen   . Prostate cancer [facility]) urologist-  dr winter/  oncologist-  dr Tammi Klippel   dx 07-21-2017--- Stage T1c, Gleason 4+3,  PSA 13.40,  vol 49.7cc--  scheduled for radioactive seed implants 12-03-2017    Patient Active Problem List   Diagnosis Date Noted  . Acute CVA (cerebrovascular accident) ([place]) 02/26/2019  . Polysubstance abuse ([place]) 02/26/2019  . Malignant neoplasm of prostate ([place]) 10/02/2017  . Noncompliance with medication regimen 12/18/2016  . Chronic tension type headache 10/20/2016  . Headache(784.0) 10/19/2013  . Itching 10/19/2013  . Smoker 10/19/2013  . Essential hypertension, benign 07/08/2013  . Pain in joint, shoulder region 07/08/2013  . Cervical disc disorder  with radiculopathy of cervical region 07/08/2013  . Dental caries 07/08/2013  . Spinal stenosis in cervical region 07/07/2013  . Tendinitis of left rotator cuff 07/07/2013    Past Surgical History:  Procedure Laterality Date  . ABDOMINAL EXPLORATION SURGERY  age 63   per pt had Small Bowel Resection and Appendectomy  . APPENDECTOMY    . ARM SURGERY Left 1999   REPAIR INJURY-- "ARM CUT ABOUT OFF"  . PROSTATE BIOPSY  07/21/2017  dr winter office  . RADIOACTIVE SEED IMPLANT N/A 12/03/2017   Procedure: RADIOACTIVE SEED IMPLANT/BRACHYTHERAPY IMPLANT;  Surgeon: Ceasar Mons, MD;  Location: [facility];  Service: Urology;  Laterality: N/A;  ONLY NEEDS 120 MIN  . SPACE OAR INSTILLATION N/A 12/03/2017   Procedure: SPACE OAR INSTILLATION;  Surgeon: Ceasar Mons, MD;  Location: [facility];  Service: Urology;  Laterality: N/A;  . TOOTH EXTRACTION N/A 09/21/2018   Procedure: DENTAL EXTRACTIONS WITH ALVEOLIPLASTY;  Surgeon: Diona Browner, DDS;  Location: [place];  Service: Oral Surgery;  Laterality: N/A;       Family History  Problem Relation Age of Onset  . Cancer Mother   . Diabetes Paternal Grandfather     Social History   Tobacco Use  . Smoking status: Current Every Day Smoker    Packs/day: 0.50    Years: 40.00    Pack years: 20.00    Types: Cigarettes  . Smokeless tobacco: Never Used  Substance Use Topics  .  Alcohol use: Yes    Alcohol/week: 21.0 standard drinks    Types: 21 Cans of beer per week    Comment: 1-2 12oz  beers every evening  . Drug use: Yes    Types: Marijuana    Comment:  per pt uses once week    Home Medications Prior to Admission medications   Medication Sig Start Date End Date Taking? Authorizing Provider  albuterol (VENTOLIN HFA) 108 (90 Base) MCG/ACT inhaler Inhale 2 puffs into the lungs every 6 (six) hours as needed for wheezing or shortness of breath.  10/22/18  Yes [provider]  aspirin EC  325 MG EC tablet Take 1 tablet (325 mg total) by mouth daily. 02/27/19  Yes Eulogio Bear U, DO  atorvastatin (LIPITOR) 80 MG tablet Take 1 tablet (80 mg total) by mouth daily at 6 PM. 02/26/19  Yes Vann, Jessica U, DO  lisinopril (ZESTRIL) 40 MG tablet Take 1 tablet (40 mg total) by mouth every morning. 02/28/19  Yes Eulogio Bear U, DO  clopidogrel (PLAVIX) 75 MG tablet Take 1 tablet (75 mg total) by mouth daily. Patient not taking: Reported on 06/15/2019 02/26/19   Geradine Girt, DO    Allergies    Penicillins  Review of Systems   Review of Systems  Constitutional: Negative for appetite change and fatigue.  HENT: Negative for congestion, ear discharge and sinus pressure.   Eyes: Negative for discharge.  Respiratory: Negative for cough.   Cardiovascular: Negative for chest pain.  Gastrointestinal: Positive for abdominal pain. Negative for diarrhea.  Genitourinary: Negative for frequency and hematuria.  Musculoskeletal: Negative for back pain.  Skin: Negative for rash.  Neurological: Negative for seizures and headaches.  Psychiatric/Behavioral: Negative for hallucinations.    Physical Exam Updated Vital Signs BP (!) 157/101 (BP Location: Left Arm)   Pulse 71   Temp 97.7 F (36.5 C) (Oral)   Resp 20   Ht 5\' 10"  (1.778 m)   Wt 90.7 kg   SpO2 100%   BMI 28.70 kg/m   Physical Exam Vitals and nursing note reviewed.  Constitutional:      General: He is in acute distress.     Appearance: He is well-developed.  HENT:     Head: Normocephalic.     Nose: Nose normal.  Eyes:     General: No scleral icterus.    Conjunctiva/sclera: Conjunctivae normal.  Neck:     Thyroid: No thyromegaly.  Cardiovascular:     Rate and Rhythm: Normal rate and regular rhythm.     Heart sounds: No murmur. No friction rub. No gallop.   Pulmonary:     Breath sounds: No stridor. No wheezing or rales.  Chest:     Chest wall: No tenderness.  Abdominal:     General: There is no distension.      Tenderness: There is abdominal tenderness. There is no rebound.  Musculoskeletal:        General: Normal range of motion.     Cervical back: Neck supple.  Lymphadenopathy:     Cervical: No cervical adenopathy.  Skin:    Findings: No erythema or rash.  Neurological:     Mental Status: He is oriented to person, place, and time.     Motor: No abnormal muscle tone.     Coordination: Coordination normal.  Psychiatric:        Behavior: Behavior normal.     ED Results / Procedures / Treatments   Labs (all labs ordered are listed, but  only abnormal results are displayed) Labs Reviewed  COMPREHENSIVE METABOLIC PANEL - Abnormal; Notable for the following components:      Result Value   Glucose, Bld 115 (*)    Creatinine, Ser 1.27 (*)    All other components within normal limits  CBC WITH DIFFERENTIAL/PLATELET  URINALYSIS, ROUTINE W REFLEX MICROSCOPIC    EKG None  Radiology No results found.  Procedures Procedures (including critical care time)  Medications Ordered in ED Medications  HYDROmorphone (DILAUDID) injection 1 mg (1 mg Intravenous Given 06/15/19 1407)  ondansetron (ZOFRAN) injection 4 mg (4 mg Intravenous Given 06/15/19 1407)  LORazepam (ATIVAN) injection 0.5 mg (0.5 mg Intravenous Given 06/15/19 1407)    ED Course  I have reviewed the triage vital signs and the nursing notes.  Pertinent labs & imaging results that were available during my care of the patient were reviewed by me and considered in my medical decision making (see chart for details).    MDM Rules/Calculators/A&P                      Patient with inability to urinate.  Foley has been attempted but unable to be placed.  Coud catheter was also tried.  Urology has been called and they will come see the patient for urinary retention.   The urologist placed the catheter in the patient.  He wanted the patient placed on Cipro for a week and they will follow-up with him next week Final Clinical Impression(s)  / ED Diagnoses Final diagnoses:  None    Rx / DC Orders ED Discharge Orders    None       Milton Ferguson, MD 06/15/19 McDonald, Doyal Saric, MD 06/15/19 1649

## 2019-06-15 NOTE — Discharge Instructions (Signed)
Follow-up with your urologist next week.  If they do not contact you in the next day about an appointment then you should call and check and see when your appointment is.  Just take Tylenol or Motrin for discomfort  Foley Catheter Care A soft, flexible tube (Foley catheter) may have been placed in your bladder to drain urine and fluid. Follow these instructions: Taking Care of the Catheter  Keep the area where the catheter leaves your body clean.   Attach the catheter to the leg so there is no tension on the catheter.   Keep the drainage bag below the level of the bladder, but keep it OFF the floor.   Do not take long soaking baths. Your caregiver will give instructions about showering.   Wash your hands before touching ANYTHING related to the catheter or bag.   Using mild soap and warm water on a washcloth:   Clean the area closest to the catheter insertion site using a circular motion around the catheter.   Clean the catheter itself by wiping AWAY from the insertion site for several inches down the tube.   NEVER wipe upward as this could sweep bacteria up into the urethra (tube in your body that normally drains the bladder) and cause infection.   Place a small amount of sterile lubricant at the tip of the penis where the catheter is entering.  Taking Care of the Drainage Bags  Two drainage bags may be taken home: a large overnight drainage bag, and a smaller leg bag which fits underneath clothing.   It is okay to wear the overnight bag at any time, but NEVER wear the smaller leg bag at night.   Keep the drainage bag well below the level of your bladder. This prevents backflow of urine into the bladder and allows the urine to drain freely.   Anchor the tubing to your leg to prevent pulling or tension on the catheter. Use tape or a leg strap provided by the hospital.   Empty the drainage bag when it is 1/2 to 3/4 full. Wash your hands before and after touching the bag.    Periodically check the tubing for kinks to make sure there is no pressure on the tubing which could restrict the flow of urine.  Changing the Drainage Bags  Cleanse both ends of the clean bag with alcohol before changing.   Pinch off the rubber catheter to avoid urine spillage during the disconnection.   Disconnect the dirty bag and connect the clean one.   Empty the dirty bag carefully to avoid a urine spill.   Attach the new bag to the leg with tape or a leg strap.  Cleaning the Drainage Bags  Whenever a drainage bag is disconnected, it must be cleaned quickly so it is ready for the next use.   Wash the bag in warm, soapy water.   Rinse the bag thoroughly with warm water.   Soak the bag for 30 minutes in a solution of white vinegar and water (1 cup vinegar to 1 quart warm water).   Rinse with warm water.  SEEK MEDICAL CARE IF:   You have chills or night sweats.   You are leaking around your catheter or have problems with your catheter. It is not uncommon to have sporadic leakage around your catheter as a result of bladder spasms. If the leakage stops, there is not much need for concern. If you are uncertain, call your caregiver.   You develop side  effects that you think are coming from your medicines.  SEEK IMMEDIATE MEDICAL CARE IF:   You are suddenly unable to urinate. Check to see if there are any kinks in the drainage tubing that may cause this. If you cannot find any kinks, call your caregiver immediately. This is an emergency.   You develop shortness of breath or chest pains.   Bleeding persists or clots develop in your urine.   You have a fever.   You develop pain in your back or over your lower belly (abdomen).   You develop pain or swelling in your legs.   Any problems you are having get worse rather than better.  MAKE SURE YOU:   Understand these instructions.   Will watch your condition.   Will get help right away if you are not doing well or get  worse.

## 2019-06-15 NOTE — ED Notes (Signed)
Patient was bladder scanned and the bladder scanner reports only 200ccs in bladder. Obvious distention to belly, RN believes there is more than 200 ccs

## 2019-06-16 ENCOUNTER — Inpatient Hospital Stay: Payer: Medicaid Other | Admitting: Adult Health

## 2019-06-16 ENCOUNTER — Telehealth: Payer: Self-pay | Admitting: Adult Health

## 2019-06-16 LAB — URINE CULTURE: Culture: NO GROWTH

## 2019-06-16 NOTE — Progress Notes (Deleted)
[facility] [address] [place]. [place] [address] [phone]       HOSPITAL FOLLOW UP NOTE  [name] Date of Birth:  14-Nov-1960 Medical Record Number:  [id number]   Reason for Referral:  hospital stroke follow up    CHIEF COMPLAINT:  No chief complaint on file.   HPI: Frank Fischer being seen today for in office hospital follow-up regarding multiple right brain infarcts in setting of severe right ICA stenosis and cocaine use on 02/25/2019.  History obtained from *** and chart review. Reviewed all radiology images and labs personally.  [name] is a 59 y.o. male with history of poly substance abuse, chronic HA, previous stroke, HTN, prostate cancer  presented 02/25/2019 with HA x 3 weeks followed by new onset of L hand numbness and weakness day of admission.  Evaluated by Dr. Erlinda Fischer and stroke team with stroke work-up showing multiple right brain infarcts as evidenced on MRI in setting of severe right ICA stenosis and cocaine use. MRI showed multifocal acute ischemia within the right hemisphere including along the precentral gyrus, precentral gyrus, posterior right parietal and right occipital as well as petechial hemorrhage at right parieto-occipital infarct.  MRA head showed severe stenosis right ICA cavernous and multimild stenosis left ICA.  MRA neck unremarkable.  CTA head showed severe stenosis right ICA With additional site mild to moderate stenosis and proximal distal left PCA P2 stenosis.  CTA neck showed bilateral ICA scattered plaques.  2D echo showed an EF of 60 to 65%.  UDS positive for cocaine and THC.  Recommended to initiate DAPT for 3 months then aspirin alone given severe intercranial stenosis.  HTN stable.  LDL 128 initiate atorvastatin 80 mg daily.  No evidence or history of DM with A1c 5.8.  Current tobacco use with smoking cessation counseling provided.  Current cocaine abuse as evidenced on UDS and cessation education provided.   Current alcohol abuse placed on FA/B1/MVI and advised to drink no more than 2 drinks per day.  UDS also positive for THC and advised to stop using due to stroke risk.  Other active problems include chronic tension headache and sinus bradycardia.  Residual deficits of mild left hand weakness and discharged home in stable condition with recommendation of outpatient PT and OT.  Frank Fischer is a 59 year old male who is being seen today for hospital follow-up.  As recommended by Dr. Erlinda Fischer to follow-up with Frank Fischer at discharge as he was previously seen by her for chronic tension headaches but due to no-shows, she recommended to reschedule follow-up with myself.  Residual deficits ***.  He was previously working with therapy but due to no-shows, he was discharged from clinic.  Completed 3 months DAPT and continues on aspirin alone without bleeding or bruising.  Continues on atorvastatin without myalgias.  Blood pressure today ***.      ROS:   14 system review of systems performed and negative with exception of ***  PMH:  Past Medical History:  Diagnosis Date  . Arthritis   . Cervical stenosis of spine   . Chronic headaches   . Dental caries    periodontal disease  . Frequency of urination   . History of cerebral infarction 11-28-2017  per pt never had symptoms   per MRI imaging 10-31-2016 2 small chronic lacunar infartions in the right frontal periventricular white matter , and chronic left medial orbital fracture (as seen 2014 MRI)  . Hypertension   . Noncompliance  with medication regimen   . Prostate cancer [facility]) urologist-  dr Frank Fischer/  oncologist-  dr Frank Fischer   dx 07-21-2017--- Stage T1c, Gleason 4+3,  PSA 13.40,  vol 49.7cc--  scheduled for radioactive seed implants 12-03-2017    PSH:  Past Surgical History:  Procedure Laterality Date  . ABDOMINAL EXPLORATION SURGERY  age 40   per pt had Small Bowel Resection and Appendectomy  . APPENDECTOMY    . ARM SURGERY Left 1999   REPAIR INJURY--  "ARM CUT ABOUT OFF"  . PROSTATE BIOPSY  07/21/2017  dr Frank Fischer office  . RADIOACTIVE SEED IMPLANT N/A 12/03/2017   Procedure: RADIOACTIVE SEED IMPLANT/BRACHYTHERAPY IMPLANT;  Surgeon: Frank Mons, MD;  [facility];  Service: Urology;  Laterality: N/A;  ONLY NEEDS 120 MIN  . SPACE OAR INSTILLATION N/A 12/03/2017   Procedure: SPACE OAR INSTILLATION;  Surgeon: Frank Mons, MD;  Location: [facility];  Service: Urology;  Laterality: N/A;  . TOOTH EXTRACTION N/A 09/21/2018   Procedure: DENTAL EXTRACTIONS WITH ALVEOLIPLASTY;  Surgeon: Frank Fischer, DDS;  Location: [facility];  Service: Oral Surgery;  Laterality: N/A;    Social History:  Social History   Socioeconomic History  . Marital status: Single    Spouse name: Not on file  . Number of children: 2  . Years of education: 6  . Highest education level: Not on file  Occupational History  . Occupation: N/A  Tobacco Use  . Smoking status: Current Every Day Smoker    Packs/day: 0.50    Years: 40.00    Pack years: 20.00    Types: Cigarettes  . Smokeless tobacco: Never Used  Substance and Sexual Activity  . Alcohol use: Yes    Alcohol/week: 21.0 standard drinks    Types: 21 Cans of beer per week    Comment: 1-2 12oz  beers every evening  . Drug use: Yes    Types: Marijuana    Comment:  per pt uses once week  . Sexual activity: Yes  Other Topics Concern  . Not on file  Social History Narrative   Lives at home alone   Right-handed   Caffeine: tea throughout the day   03/04/19 [name] is a 59 year old male who is single living at home alone in his apartment. He voices his support system is his fiance, Frank Fischer and brother Frank Fischer. He denies issues with transportation to medical appointments. He is independent in his care needs   Social Determinants of Health   Financial Resource Strain:   . Difficulty of Paying Living Expenses: Not on file  Food  Insecurity: No Food Insecurity  . Worried About Charity fundraiser in the Last Year: Never true  . Ran Out of Food in the Last Year: Never true  Transportation Needs: No Transportation Needs  . Lack of Transportation (Medical): No  . Lack of Transportation (Non-Medical): No  Physical Activity:   . Days of Exercise per Week: Not on file  . Minutes of Exercise per Session: Not on file  Stress:   . Feeling of Stress : Not on file  Social Connections: Unknown  . Frequency of Communication with Friends and Family: Three times a week  . Frequency of Social Gatherings with Friends and Family: Three times a week  . Attends Religious Services: 1 to 4 times per year  . Active Member of Clubs or Organizations: Not on file  . Attends Archivist Meetings:  Not on file  . Marital Status: Never married  Intimate Partner Violence:   . Fear of Current or Ex-Partner: Not on file  . Emotionally Abused: Not on file  . Physically Abused: Not on file  . Sexually Abused: Not on file    Family History:  Family History  Problem Relation Age of Onset  . Cancer Mother   . Diabetes Paternal Grandfather     Medications:   Current Outpatient Medications on File Prior to Visit  Medication Sig Dispense Refill  . albuterol (VENTOLIN HFA) 108 (90 Base) MCG/ACT inhaler Inhale 2 puffs into the lungs every 6 (six) hours as needed for wheezing or shortness of breath.     Marland Kitchen aspirin EC 325 MG EC tablet Take 1 tablet (325 mg total) by mouth daily. 30 tablet 0  . atorvastatin (LIPITOR) 80 MG tablet Take 1 tablet (80 mg total) by mouth daily at 6 PM. 30 tablet 0  . ciprofloxacin (CIPRO) 500 MG tablet Take 1 tablet (500 mg total) by mouth 2 (two) times daily. 14 tablet 0  . clopidogrel (PLAVIX) 75 MG tablet Take 1 tablet (75 mg total) by mouth daily. (Patient not taking: Reported on 06/15/2019) 30 tablet 2  . lisinopril (ZESTRIL) 40 MG tablet Take 1 tablet (40 mg total) by mouth every morning.     No current  facility-administered medications on file prior to visit.    Allergies:   Allergies  Allergen Reactions  . Penicillins Itching    Did it involve swelling of the face/tongue/throat, SOB, or low BP? No Did it involve sudden or severe rash/hives, skin peeling, or any reaction on the inside of your mouth or nose? Yes Did you need to seek medical attention at a hospital or doctor's office? No When did it last happen?09/2018 If all above answers are "NO", may proceed with cephalosporin use.     Physical Exam  There were no vitals filed for this visit. There is no height or weight on file to calculate BMI. No exam data present  Depression screen [facility] 2/9 04/20/2019  Decreased Interest 0  Down, Depressed, Hopeless 0  PHQ - 2 Score 0     General: well developed, well nourished, seated, in no evident distress Head: head normocephalic and atraumatic.   Neck: supple with no carotid or supraclavicular bruits Cardiovascular: regular rate and rhythm, no murmurs Musculoskeletal: no deformity Skin:  no rash/petichiae Vascular:  Normal pulses all extremities   Neurologic Exam Mental Status: Awake and fully alert. Oriented to place and time. Recent and remote memory intact. Attention span, concentration and fund of knowledge appropriate. Mood and affect appropriate.  Cranial Nerves: Fundoscopic exam reveals sharp disc margins. Pupils equal, briskly reactive to light. Extraocular movements full without nystagmus. Visual fields full to confrontation. Hearing intact. Facial sensation intact. Face, tongue, palate moves normally and symmetrically.  Motor: Normal bulk and tone. Normal strength in all tested extremity muscles. Sensory.: intact to touch , pinprick , position and vibratory sensation.  Coordination: Rapid alternating movements normal in all extremities. Finger-to-nose and heel-to-shin performed accurately bilaterally. Gait and Station: Arises from chair without difficulty. Stance is  normal. Gait demonstrates normal stride length and balance Reflexes: 1+ and symmetric. Toes downgoing.     NIHSS  *** Modified Rankin  ***    Diagnostic Data (Labs, Imaging, Testing)  CT HEAD WO CONTRAST 02/25/2019 IMPRESSION: 1. Small acute infarcts involving the right frontal, parietal, and occipital lobes.  CT ANGIO HEAD W OR WO CONTRAST CT  ANGIO NECK W OR WO CONTRAST 02/26/2019 IMPRESSION: CTA head: 1. 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 Confirmed severe stenosis within the cavernous right ICA. Additional sites of mild to moderate stenosis as described. 2. Mild-to-moderate focal stenoses within the proximal and distal P2 left posterior cerebral artery. CTA neck: Common carotid, internal carotid and vertebral arteries patent within the neck bilaterally without significant stenosis. Mild scattered atherosclerotic plaque within the bilateral carotid systems. No evidence of dissection or aneurysm.  MR BRAIN WO CONTRAST MR MRA HEAD  MR MRA NECK 02/26/2019 IMPRESSION: 1. Multifocal acute ischemia within the right hemisphere, including along the precentral gyrus, postcentral gyrus, posterior right parietal lobe and right occipital lobe. 2. Petechial hemorrhage at the right parieto-occipital infarct site. No mass effect. 3. 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 Multifocal mild stenosis of the left cavernous and clinoid segments. 4. Normal MRA of the neck. No proximal embolic source is identified. Correlation with echocardiography may be helpful.  ECHOCARDIOGRAM 02/26/2019 IMPRESSIONS  1. Left ventricular ejection fraction, by visual estimation, is 60 to 65%. The left ventricle has normal function. Normal left ventricular size. There is no left ventricular hypertrophy.  2. Global right ventricle has normal systolic function.The right ventricular size is normal. No increase in right ventricular  wall thickness.  3. Left atrial size was normal.  4. Right atrial size was normal.  5. The mitral valve is normal in structure. No evidence of mitral valve regurgitation. No evidence of mitral stenosis.  6. The tricuspid valve is normal in structure. Tricuspid valve regurgitation was not visualized by color flow Doppler.  7. The aortic valve is normal in structure. Aortic valve regurgitation was not visualized by color flow Doppler. Structurally normal aortic valve, with no evidence of sclerosis or stenosis.  8. The pulmonic valve was normal in structure. Pulmonic valve regurgitation is not visualized by color flow Doppler.  9. The inferior vena cava is normal in size with greater than 50% respiratory variability, suggesting right atrial pressure of 3 mmHg. 10. No embolic source identified.  Drugs of Abuse     Component Value Date/Time   LABOPIA NONE DETECTED 02/26/2019 0028   COCAINSCRNUR POSITIVE (A) 02/26/2019 0028   COCAINSCRNUR NEG 06/21/2013 0000   LABBENZ NONE DETECTED 02/26/2019 0028   LABBENZ NEG 06/21/2013 0000   AMPHETMU NONE DETECTED 02/26/2019 0028   THCU POSITIVE (A) 02/26/2019 0028   LABBARB NONE DETECTED 02/26/2019 0028     Lipid Panel     Component Value Date/Time   CHOL 182 02/26/2019 0406   TRIG 112 02/26/2019 0406   HDL 32 (L) 02/26/2019 0406   CHOLHDL 5.7 02/26/2019 0406   VLDL 22 02/26/2019 0406   LDLCALC 128 (H) 02/26/2019 0406      ASSESSMENT: Frank Fischer is a 59 y.o. year old male presented with headache x3 weeks followed by new onset left hand numbness and weakness on 02/26/2019 with stroke work-up revealing multiple right brain infarcts in setting of severe right ICA stenosis and cocaine use. Vascular risk factors include HTN, HLD, chronic headache, prior strokes, polysubstance abuse including cocaine, THC, EtOH and tobacco.     PLAN:  1. Multiple right brain infarcts: Continue aspirin 81 mg daily  and atorvastatin for secondary stroke  prevention. Maintain strict control of hypertension with blood pressure goal below 130/90, diabetes with hemoglobin A1c goal below 6.5% and cholesterol with LDL cholesterol (bad cholesterol) goal below 70 mg/dL.  I also advised the patient to  eat a healthy diet with plenty of whole grains, cereals, fruits and vegetables, exercise regularly with at least 30 minutes of continuous activity daily and maintain ideal body weight. 2. HTN: Advised to continue current treatment regimen.  Today's BP ***.  Advised to continue to monitor at home along with continued follow-up with PCP for management 3. HLD: Advised to continue current treatment regimen along with continued follow-up with PCP for future prescribing and monitoring of lipid panel 4. Polysubstance abuse: 5. Right ICA stenosis: Completed 3 months DAPT with continue on aspirin alone as well as strict control of HTN and HLD and avoidance of tobacco, EtOH, cocaine and THC    Follow up in *** or call earlier if needed   Greater than 50% of time during this 45 minute visit was spent on counseling, explanation of diagnosis of multiple right brain infarcts, reviewing risk factor management of HTN, HLD, polysubstance abuse, prior strokes and severe right ICA stenosis, planning of further management along with potential future management, and discussion with patient and family answering all questions.    Frann Rider, AGNP-BC  [facility] [address] [place], [place] [address]  Phone [phone] Fax [phone] Note: This document was prepared with digital dictation and possible smart phrase technology. Any transcriptional errors that result from this process are unintentional.

## 2019-06-16 NOTE — Telephone Encounter (Signed)
Patient no-show for today's appointment 06/16/2019.  He was a prior no-show when he was scheduled with Dr. Jaynee Eagles back in November.  Request patient be discharged from clinic at this time due to follow-up noncompliance

## 2019-06-17 NOTE — Telephone Encounter (Signed)
Allow 1 more no show. If the patient no shows on 06/29/2019 we can dismiss.

## 2019-06-18 ENCOUNTER — Ambulatory Visit: Payer: Medicaid Other | Attending: Internal Medicine

## 2019-06-18 DIAGNOSIS — Z20822 Contact with and (suspected) exposure to covid-19: Secondary | ICD-10-CM

## 2019-06-19 LAB — NOVEL CORONAVIRUS, NAA: SARS-CoV-2, NAA: NOT DETECTED

## 2019-06-21 ENCOUNTER — Encounter: Payer: Self-pay | Admitting: Adult Health

## 2019-06-29 ENCOUNTER — Telehealth: Payer: Self-pay

## 2019-06-29 ENCOUNTER — Inpatient Hospital Stay: Payer: Medicaid Other | Admitting: Adult Health

## 2019-06-29 NOTE — Progress Notes (Deleted)
[facility] [address] [place]. [place] [address] [phone]       HOSPITAL FOLLOW UP NOTE  Frank Fischer Date of Birth:  10/18/1960 Medical Record Number:  [id number]   Reason for Referral:  hospital stroke follow up    CHIEF COMPLAINT:  No chief complaint on file.   HPI: Frank Fischer being seen today for in office hospital follow-up regarding multiple right brain infarcts in setting of severe right ICA stenosis and cocaine use on 02/25/2019.  History obtained from *** and chart review. Reviewed all radiology images and labs personally.  Frank Fischer is a 59 y.o. male with history of poly substance abuse, chronic HA, previous stroke, HTN, prostate cancer  presented 02/25/2019 with HA x 3 weeks followed by new onset of L hand numbness and weakness day of admission.  Evaluated by Dr. Erlinda Fischer and stroke team with stroke work-up showing multiple right brain infarcts as evidenced on MRI in setting of severe right ICA stenosis and cocaine use. MRI showed multifocal acute ischemia within the right hemisphere including along the precentral gyrus, precentral gyrus, posterior right parietal and right occipital as well as petechial hemorrhage at right parieto-occipital infarct.  MRA head showed severe stenosis right ICA cavernous and multimild stenosis left ICA.  MRA neck unremarkable.  CTA head showed severe stenosis right ICA With additional site mild to moderate stenosis and proximal distal left PCA P2 stenosis.  CTA neck showed bilateral ICA scattered plaques.  2D echo showed an EF of 60 to 65%.  UDS positive for cocaine and THC.  Recommended to initiate DAPT for 3 months then aspirin alone given severe intercranial stenosis.  HTN stable.  LDL 128 initiate atorvastatin 80 mg daily.  No evidence or history of DM with A1c 5.8.  Current tobacco use with smoking cessation counseling provided.  Current cocaine abuse as evidenced on UDS and cessation education provided.   Current alcohol abuse placed on FA/B1/MVI and advised to drink no more than 2 drinks per day.  UDS also positive for THC and advised to stop using due to stroke risk.  Other active problems include chronic tension headache and sinus bradycardia.  Residual deficits of mild left hand weakness and discharged home in stable condition with recommendation of outpatient PT and OT.  Frank Fischer is a 59 year old male who is being seen today for hospital follow-up.  As recommended by Dr. Erlinda Fischer to follow-up with Dr. Jaynee Fischer at discharge as he was previously seen by her for chronic tension headaches but due to no-shows, she recommended to reschedule follow-up with myself.  Residual deficits ***.  He was previously working with therapy but due to no-shows, he was discharged from clinic.  Completed 3 months DAPT and continues on aspirin alone without bleeding or bruising.  Continues on atorvastatin without myalgias.  Blood pressure today ***.      ROS:   14 system review of systems performed and negative with exception of ***  PMH:  Past Medical History:  Diagnosis Date  . Arthritis   . Cervical stenosis of spine   . Chronic headaches   . Dental caries    periodontal disease  . Frequency of urination   . History of cerebral infarction 11-28-2017  per pt never had symptoms   per MRI imaging 10-31-2016 2 small chronic lacunar infartions in the right frontal periventricular white matter , and chronic left medial orbital fracture (as seen 2014 MRI)  . Hypertension   . Noncompliance  with medication regimen   . Prostate cancer [facility]) urologist-  dr Fischer/  oncologist-  dr Frank Fischer   dx 07-21-2017--- Stage T1c, Gleason 4+3,  PSA 13.40,  vol 49.7cc--  scheduled for radioactive seed implants 12-03-2017    PSH:  Past Surgical History:  Procedure Laterality Date  . ABDOMINAL EXPLORATION SURGERY  age 37   per pt had Small Bowel Resection and Appendectomy  . APPENDECTOMY    . ARM SURGERY Left 1999   REPAIR INJURY--  "ARM CUT ABOUT OFF"  . PROSTATE BIOPSY  07/21/2017  dr Fischer office  . RADIOACTIVE SEED IMPLANT N/A 12/03/2017   Procedure: RADIOACTIVE SEED IMPLANT/BRACHYTHERAPY IMPLANT;  Surgeon: Frank Fischer;  Location: [facility];  Service: Urology;  Laterality: N/A;  ONLY NEEDS 120 MIN  . SPACE OAR INSTILLATION N/A 12/03/2017   Procedure: SPACE OAR INSTILLATION;  Surgeon: Frank Fischer;  Location: [facility];  Service: Urology;  Laterality: N/A;  . TOOTH EXTRACTION N/A 09/21/2018   Procedure: DENTAL EXTRACTIONS WITH ALVEOLIPLASTY;  Surgeon: Frank Fischer;  Location: [place];  Service: Oral Surgery;  Laterality: N/A;    Social History:  Social History   Socioeconomic History  . Marital status: Single    Spouse name: Not on file  . Number of children: 2  . Years of education: 87  . Highest education level: Not on file  Occupational History  . Occupation: N/A  Tobacco Use  . Smoking status: Current Every Day Smoker    Packs/day: 0.50    Years: 40.00    Pack years: 20.00    Types: Cigarettes  . Smokeless tobacco: Never Used  Substance and Sexual Activity  . Alcohol use: Yes    Alcohol/week: 21.0 standard drinks    Types: 21 Cans of beer per week    Comment: 1-2 12oz  beers every evening  . Drug use: Yes    Types: Marijuana    Comment:  per pt uses once week  . Sexual activity: Yes  Other Topics Concern  . Not on file  Social History Narrative   Lives at home alone   Right-handed   Caffeine: tea throughout the day   03/04/19 [name] is a 59 year old male who is single living at home alone in his apartment. He voices his support system is his fiance, Frank Fischer and brother Frank Fischer. He denies issues with transportation to medical appointments. He is independent in his care needs   Social Determinants of Health   Financial Resource Strain:   . Difficulty of Paying Living Expenses: Not on file  Food  Insecurity: No Food Insecurity  . Worried About Charity fundraiser in the Last Year: Never true  . Ran Out of Food in the Last Year: Never true  Transportation Needs: No Transportation Needs  . Lack of Transportation (Medical): No  . Lack of Transportation (Non-Medical): No  Physical Activity:   . Days of Exercise per Week: Not on file  . Minutes of Exercise per Session: Not on file  Stress:   . Feeling of Stress : Not on file  Social Connections: Unknown  . Frequency of Communication with Friends and Family: Three times a week  . Frequency of Social Gatherings with Friends and Family: Three times a week  . Attends Religious Services: 1 to 4 times per year  . Active Member of Clubs or Organizations: Not on file  . Attends Archivist Meetings:  Not on file  . Marital Status: Never married  Intimate Partner Violence:   . Fear of Current or Ex-Partner: Not on file  . Emotionally Abused: Not on file  . Physically Abused: Not on file  . Sexually Abused: Not on file    Family History:  Family History  Problem Relation Age of Onset  . Cancer Mother   . Diabetes Paternal Grandfather     Medications:   Current Outpatient Medications on File Prior to Visit  Medication Sig Dispense Refill  . albuterol (VENTOLIN HFA) 108 (90 Base) MCG/ACT inhaler Inhale 2 puffs into the lungs every 6 (six) hours as needed for wheezing or shortness of breath.     Marland Kitchen aspirin EC 325 MG EC tablet Take 1 tablet (325 mg total) by mouth daily. 30 tablet 0  . atorvastatin (LIPITOR) 80 MG tablet Take 1 tablet (80 mg total) by mouth daily at 6 PM. 30 tablet 0  . ciprofloxacin (CIPRO) 500 MG tablet Take 1 tablet (500 mg total) by mouth 2 (two) times daily. 14 tablet 0  . clopidogrel (PLAVIX) 75 MG tablet Take 1 tablet (75 mg total) by mouth daily. (Patient not taking: Reported on 06/15/2019) 30 tablet 2  . lisinopril (ZESTRIL) 40 MG tablet Take 1 tablet (40 mg total) by mouth every morning.     No current  facility-administered medications on file prior to visit.    Allergies:   Allergies  Allergen Reactions  . Penicillins Itching    Did it involve swelling of the face/tongue/throat, SOB, or low BP? No Did it involve sudden or severe rash/hives, skin peeling, or any reaction on the inside of your mouth or nose? Yes Did you need to seek medical attention at a hospital or doctor's office? No When did it last happen?09/2018 If all above answers are "NO", may proceed with cephalosporin use.     Physical Exam  There were no vitals filed for this visit. There is no height or weight on file to calculate BMI. No exam data present  Depression screen [facility] 2/9 04/20/2019  Decreased Interest 0  Down, Depressed, Hopeless 0  PHQ - 2 Score 0     General: well developed, well nourished, seated, in no evident distress Head: head normocephalic and atraumatic.   Neck: supple with no carotid or supraclavicular bruits Cardiovascular: regular rate and rhythm, no murmurs Musculoskeletal: no deformity Skin:  no rash/petichiae Vascular:  Normal pulses all extremities   Neurologic Exam Mental Status: Awake and fully alert. Oriented to place and time. Recent and remote memory intact. Attention span, concentration and fund of knowledge appropriate. Mood and affect appropriate.  Cranial Nerves: Fundoscopic exam reveals sharp disc margins. Pupils equal, briskly reactive to light. Extraocular movements full without nystagmus. Visual fields full to confrontation. Hearing intact. Facial sensation intact. Face, tongue, palate moves normally and symmetrically.  Motor: Normal bulk and tone. Normal strength in all tested extremity muscles. Sensory.: intact to touch , pinprick , position and vibratory sensation.  Coordination: Rapid alternating movements normal in all extremities. Finger-to-nose and heel-to-shin performed accurately bilaterally. Gait and Station: Arises from chair without difficulty. Stance is  normal. Gait demonstrates normal stride length and balance Reflexes: 1+ and symmetric. Toes downgoing.     NIHSS  *** Modified Rankin  ***    Diagnostic Data (Labs, Imaging, Testing)  CT HEAD WO CONTRAST 02/25/2019 IMPRESSION: 1. Small acute infarcts involving the right frontal, parietal, and occipital lobes.  CT ANGIO HEAD W OR WO CONTRAST CT  ANGIO NECK W OR WO CONTRAST 02/26/2019 IMPRESSION: CTA head: 1. 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 Confirmed severe stenosis within the cavernous right ICA. Additional sites of mild to moderate stenosis as described. 2. Mild-to-moderate focal stenoses within the proximal and distal P2 left posterior cerebral artery. CTA neck: Common carotid, internal carotid and vertebral arteries patent within the neck bilaterally without significant stenosis. Mild scattered atherosclerotic plaque within the bilateral carotid systems. No evidence of dissection or aneurysm.  MR BRAIN WO CONTRAST MR MRA HEAD  MR MRA NECK 02/26/2019 IMPRESSION: 1. Multifocal acute ischemia within the right hemisphere, including along the precentral gyrus, postcentral gyrus, posterior right parietal lobe and right occipital lobe. 2. Petechial hemorrhage at the right parieto-occipital infarct site. No mass effect. 3. 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 Multifocal mild stenosis of the left cavernous and clinoid segments. 4. Normal MRA of the neck. No proximal embolic source is identified. Correlation with echocardiography may be helpful.  ECHOCARDIOGRAM 02/26/2019 IMPRESSIONS  1. Left ventricular ejection fraction, by visual estimation, is 60 to 65%. The left ventricle has normal function. Normal left ventricular size. There is no left ventricular hypertrophy.  2. Global right ventricle has normal systolic function.The right ventricular size is normal. No increase in right ventricular  wall thickness.  3. Left atrial size was normal.  4. Right atrial size was normal.  5. The mitral valve is normal in structure. No evidence of mitral valve regurgitation. No evidence of mitral stenosis.  6. The tricuspid valve is normal in structure. Tricuspid valve regurgitation was not visualized by color flow Doppler.  7. The aortic valve is normal in structure. Aortic valve regurgitation was not visualized by color flow Doppler. Structurally normal aortic valve, with no evidence of sclerosis or stenosis.  8. The pulmonic valve was normal in structure. Pulmonic valve regurgitation is not visualized by color flow Doppler.  9. The inferior vena cava is normal in size with greater than 50% respiratory variability, suggesting right atrial pressure of 3 mmHg. 10. No embolic source identified.  Drugs of Abuse     Component Value Date/Time   LABOPIA NONE DETECTED 02/26/2019 0028   COCAINSCRNUR POSITIVE (A) 02/26/2019 0028   COCAINSCRNUR NEG 06/21/2013 0000   LABBENZ NONE DETECTED 02/26/2019 0028   LABBENZ NEG 06/21/2013 0000   AMPHETMU NONE DETECTED 02/26/2019 0028   THCU POSITIVE (A) 02/26/2019 0028   LABBARB NONE DETECTED 02/26/2019 0028     Lipid Panel     Component Value Date/Time   CHOL 182 02/26/2019 0406   TRIG 112 02/26/2019 0406   HDL 32 (L) 02/26/2019 0406   CHOLHDL 5.7 02/26/2019 0406   VLDL 22 02/26/2019 0406   LDLCALC 128 (H) 02/26/2019 0406      ASSESSMENT: Frank Fischer is a 59 y.o. year old male presented with headache x3 weeks followed by new onset left hand numbness and weakness on 02/26/2019 with stroke work-up revealing multiple right brain infarcts in setting of severe right ICA stenosis and cocaine use. Vascular risk factors include HTN, HLD, chronic headache, prior strokes, polysubstance abuse including cocaine, THC, EtOH and tobacco.     PLAN:  1. Multiple right brain infarcts: Continue aspirin 81 mg daily  and atorvastatin for secondary stroke  prevention. Maintain strict control of hypertension with blood pressure goal below 130/90, diabetes with hemoglobin A1c goal below 6.5% and cholesterol with LDL cholesterol (bad cholesterol) goal below 70 mg/dL.  I also advised the patient to  eat a healthy diet with plenty of whole grains, cereals, fruits and vegetables, exercise regularly with at least 30 minutes of continuous activity daily and maintain ideal body weight. 2. HTN: Advised to continue current treatment regimen.  Today's BP ***.  Advised to continue to monitor at home along with continued follow-up with PCP for management 3. HLD: Advised to continue current treatment regimen along with continued follow-up with PCP for future prescribing and monitoring of lipid panel 4. Polysubstance abuse: 5. Right ICA stenosis: Completed 3 months DAPT with continue on aspirin alone as well as strict control of HTN and HLD and avoidance of tobacco, EtOH, cocaine and THC    Follow up in *** or call earlier if needed   Greater than 50% of time during this 45 minute visit was spent on counseling, explanation of diagnosis of multiple right brain infarcts, reviewing risk factor management of HTN, HLD, polysubstance abuse, prior strokes and severe right ICA stenosis, planning of further management along with potential future management, and discussion with patient and family answering all questions.    Frann Rider, AGNP-BC  [facility] [address] [place], [place] [address]  Phone [phone] Fax [phone] Note: This document was prepared with digital dictation and possible smart phrase technology. Any transcriptional errors that result from this process are unintentional.

## 2019-06-29 NOTE — Telephone Encounter (Signed)
Due to 2 new patient no shows, request patient be discharged from office. Thank you.

## 2019-06-29 NOTE — Telephone Encounter (Signed)
Patient was a no show for his appt today and back in 06-16-2019. Would like this patient to be rescheduled? Please advise.

## 2019-06-30 ENCOUNTER — Encounter: Payer: Self-pay | Admitting: Adult Health

## 2019-11-18 IMAGING — NM NM BONE WHOLE BODY
2 series · 2 of 2 positions shown · non-contrast
Comparison: CT of the abdomen and pelvis on 09/03/2017

CLINICAL DATA: History of prostate cancer.  PSA 13.4 on 06/12/2017.

EXAM:
NUCLEAR MEDICINE WHOLE BODY BONE SCAN
TECHNIQUE: Whole body anterior and posterior images were obtained approximately
3 hours after intravenous injection of radiopharmaceutical.
RADIOPHARMACEUTICALS:  20.7 mCi 1echnetium-ZZm MDP IV

[Series 1: wbr_bone_40 whole body · 2.66mm/px · 1 of 1 slices shown (1 of 2)]
[im 1/1]
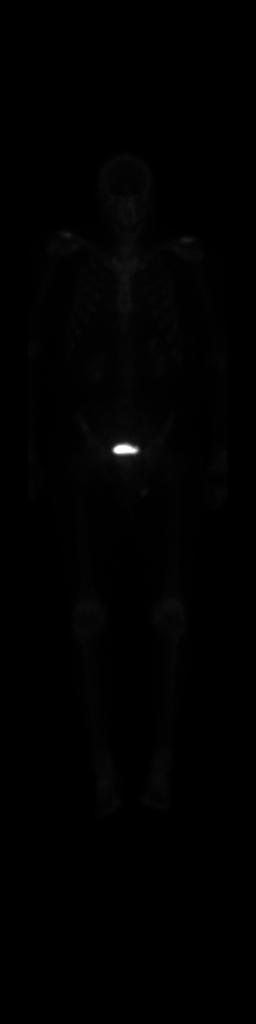

[Series 1: wbr_bone_40 whole body · 2.66mm/px · 1 of 1 slices shown (2 of 2)]
[im 1/1]
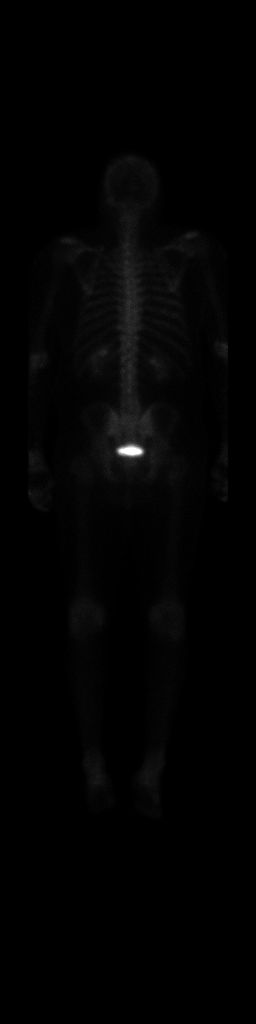

[2 of 2 positions shown; findings below may reference images not displayed]

FINDINGS: No focal areas of increased osseous remodeling. Expected renal
activity and bladder activity are noted.
IMPRESSION: No evidence for osseous metastatic disease.

## 2021-09-02 ENCOUNTER — Encounter (HOSPITAL_COMMUNITY): Payer: Self-pay | Admitting: Emergency Medicine

## 2021-09-02 ENCOUNTER — Other Ambulatory Visit: Payer: Self-pay

## 2021-09-02 ENCOUNTER — Emergency Department (HOSPITAL_COMMUNITY)
Admission: EM | Admit: 2021-09-02 | Discharge: 2021-09-03 | Disposition: A | Discharge status: Home / Self Care | Payer: Medicaid Other | Attending: Emergency Medicine | Admitting: Emergency Medicine

## 2021-09-02 DIAGNOSIS — R3915 Urgency of urination: Secondary | ICD-10-CM

## 2021-09-02 DIAGNOSIS — R39198 Other difficulties with micturition: Secondary | ICD-10-CM | POA: Insufficient documentation

## 2021-09-02 DIAGNOSIS — I10 Essential (primary) hypertension: Secondary | ICD-10-CM

## 2021-09-02 DIAGNOSIS — Z79899 Other long term (current) drug therapy: Secondary | ICD-10-CM | POA: Diagnosis not present

## 2021-09-02 DIAGNOSIS — Z7982 Long term (current) use of aspirin: Secondary | ICD-10-CM | POA: Insufficient documentation

## 2021-09-02 DIAGNOSIS — R350 Frequency of micturition: Secondary | ICD-10-CM | POA: Insufficient documentation

## 2021-09-02 DIAGNOSIS — R339 Retention of urine, unspecified: Secondary | ICD-10-CM | POA: Diagnosis not present

## 2021-09-02 DIAGNOSIS — Z7902 Long term (current) use of antithrombotics/antiplatelets: Secondary | ICD-10-CM | POA: Insufficient documentation

## 2021-09-02 DIAGNOSIS — R1030 Lower abdominal pain, unspecified: Secondary | ICD-10-CM | POA: Diagnosis not present

## 2021-09-02 DIAGNOSIS — Z8546 Personal history of malignant neoplasm of prostate: Secondary | ICD-10-CM | POA: Insufficient documentation

## 2021-09-02 LAB — URINALYSIS, ROUTINE W REFLEX MICROSCOPIC
Bilirubin Urine: NEGATIVE
Glucose, UA: NEGATIVE mg/dL
Hgb urine dipstick: NEGATIVE
Ketones, ur: NEGATIVE mg/dL
Leukocytes,Ua: NEGATIVE
Nitrite: NEGATIVE
Protein, ur: NEGATIVE mg/dL
Specific Gravity, Urine: 1.004 — ABNORMAL LOW (ref 1.005–1.030)
pH: 5 (ref 5.0–8.0)

## 2021-09-02 LAB — CBC WITH DIFFERENTIAL/PLATELET
Abs Immature Granulocytes: 0.02 10*3/uL (ref 0.00–0.07)
Basophils Absolute: 0 10*3/uL (ref 0.0–0.1)
Basophils Relative: 1 %
Eosinophils Absolute: 0.1 10*3/uL (ref 0.0–0.5)
Eosinophils Relative: 1 %
HCT: 45.3 % (ref 39.0–52.0)
Hemoglobin: 15.7 g/dL (ref 13.0–17.0)
Immature Granulocytes: 0 %
Lymphocytes Relative: 36 %
Lymphs Abs: 2.3 10*3/uL (ref 0.7–4.0)
MCH: 33.2 pg (ref 26.0–34.0)
MCHC: 34.7 g/dL (ref 30.0–36.0)
MCV: 95.8 fL (ref 80.0–100.0)
Monocytes Absolute: 0.5 10*3/uL (ref 0.1–1.0)
Monocytes Relative: 8 %
Neutro Abs: 3.5 10*3/uL (ref 1.7–7.7)
Neutrophils Relative %: 54 %
Platelets: 280 10*3/uL (ref 150–400)
RBC: 4.73 MIL/uL (ref 4.22–5.81)
RDW: 14.9 % (ref 11.5–15.5)
WBC: 6.4 10*3/uL (ref 4.0–10.5)
nRBC: 0 % (ref 0.0–0.2)

## 2021-09-02 LAB — BASIC METABOLIC PANEL
Anion gap: 8 (ref 5–15)
BUN: 11 mg/dL (ref 8–23)
CO2: 22 mmol/L (ref 22–32)
Calcium: 9.3 mg/dL (ref 8.9–10.3)
Chloride: 109 mmol/L (ref 98–111)
Creatinine, Ser: 1.02 mg/dL (ref 0.61–1.24)
GFR, Estimated: >60 mL/min (ref >60)
Glucose, Bld: 99 mg/dL (ref 70–99)
Potassium: 3.9 mmol/L (ref 3.5–5.1)
Sodium: 139 mmol/L (ref 135–145)

## 2021-09-02 NOTE — ED Notes (Signed)
Pt's bladder scan is 432m ?

## 2021-09-02 NOTE — ED Triage Notes (Signed)
Pt reported tp ED with c/o frequent urge to urinate with little output. States this has been ongoing since last night. Has hx of prostate cancer and has been previously catheterized for urinary retention.  ?

## 2021-09-02 NOTE — ED Provider Triage Note (Signed)
Emergency Medicine Provider Triage Evaluation Note ? ?Frank Fischer , a 61 y.o. male  was evaluated in triage.  Pt complains of urinary retention.  He has a history of prostate cancer and requiring a Foley catheter. ?He states that this started last night.  He reports that he is not able to get more than a few drops of urine out.. ? ?Physical Exam  ?BP (!) 186/114   Pulse 92   Temp 98.1 ?F (36.7 ?C) (Oral)   Resp 20   SpO2 95%  ?Gen:   Awake, appears very uncomfortable.  Is running to the bathroom to attempt to urinate every 5 to 10 minutes. ?Resp:  Normal effort ?MSK:   Moves extremities without difficulty  ?Other:  Normal speech ? ?Medical Decision Making  ?Medically screening exam initiated at 8:54 PM.  Appropriate orders placed.  Frank Fischer was informed that the remainder of the evaluation will be completed by another provider, this initial triage assessment does not replace that evaluation, and the importance of remaining in the ED until their evaluation is complete. ? ?Bladder scan shows over 400 mL of urine consistent with patient's symptoms of outlet obstruction. ?Patient consents for catheter placement.  Orders placed for Foley. ?And his symptoms have been ongoing since last night we will also check UA, CBC, and BMP. ?  ?Frank Glass, PA-C ?09/02/21 2057 ? ?

## 2021-09-03 ENCOUNTER — Emergency Department (HOSPITAL_COMMUNITY): Payer: Medicaid Other

## 2021-09-03 ENCOUNTER — Emergency Department (HOSPITAL_COMMUNITY)
Admission: EM | Admit: 2021-09-03 | Discharge: 2021-09-04 | Disposition: A | Discharge status: Home / Self Care | Payer: Medicaid Other | Source: Home / Self Care | Attending: Emergency Medicine | Admitting: Emergency Medicine

## 2021-09-03 ENCOUNTER — Encounter (HOSPITAL_COMMUNITY): Payer: Self-pay

## 2021-09-03 DIAGNOSIS — I10 Essential (primary) hypertension: Secondary | ICD-10-CM | POA: Insufficient documentation

## 2021-09-03 DIAGNOSIS — Z7982 Long term (current) use of aspirin: Secondary | ICD-10-CM | POA: Insufficient documentation

## 2021-09-03 DIAGNOSIS — Z8546 Personal history of malignant neoplasm of prostate: Secondary | ICD-10-CM | POA: Insufficient documentation

## 2021-09-03 DIAGNOSIS — R339 Retention of urine, unspecified: Secondary | ICD-10-CM | POA: Insufficient documentation

## 2021-09-03 DIAGNOSIS — R1030 Lower abdominal pain, unspecified: Secondary | ICD-10-CM | POA: Insufficient documentation

## 2021-09-03 DIAGNOSIS — Z79899 Other long term (current) drug therapy: Secondary | ICD-10-CM | POA: Insufficient documentation

## 2021-09-03 DIAGNOSIS — R39198 Other difficulties with micturition: Secondary | ICD-10-CM | POA: Insufficient documentation

## 2021-09-03 HISTORY — PX: IR US GUIDANCE: IMG2393

## 2021-09-03 HISTORY — PX: IR GUIDED DRAIN W CATHETER PLACEMENT: IMG719

## 2021-09-03 LAB — BASIC METABOLIC PANEL
Anion gap: 9 (ref 5–15)
BUN: 13 mg/dL (ref 8–23)
CO2: 21 mmol/L — ABNORMAL LOW (ref 22–32)
Calcium: 9.6 mg/dL (ref 8.9–10.3)
Chloride: 111 mmol/L (ref 98–111)
Creatinine, Ser: 1.14 mg/dL (ref 0.61–1.24)
GFR, Estimated: >60 mL/min (ref >60)
Glucose, Bld: 123 mg/dL — ABNORMAL HIGH (ref 70–99)
Potassium: 4.1 mmol/L (ref 3.5–5.1)
Sodium: 141 mmol/L (ref 135–145)

## 2021-09-03 LAB — CBC WITH DIFFERENTIAL/PLATELET
Abs Immature Granulocytes: 0.03 10*3/uL (ref 0.00–0.07)
Basophils Absolute: 0 10*3/uL (ref 0.0–0.1)
Basophils Relative: 0 %
Eosinophils Absolute: 0 10*3/uL (ref 0.0–0.5)
Eosinophils Relative: 0 %
HCT: 46.4 % (ref 39.0–52.0)
Hemoglobin: 16.2 g/dL (ref 13.0–17.0)
Immature Granulocytes: 0 %
Lymphocytes Relative: 20 %
Lymphs Abs: 1.7 10*3/uL (ref 0.7–4.0)
MCH: 33.3 pg (ref 26.0–34.0)
MCHC: 34.9 g/dL (ref 30.0–36.0)
MCV: 95.3 fL (ref 80.0–100.0)
Monocytes Absolute: 0.6 10*3/uL (ref 0.1–1.0)
Monocytes Relative: 8 %
Neutro Abs: 6.1 10*3/uL (ref 1.7–7.7)
Neutrophils Relative %: 72 %
Platelets: 291 10*3/uL (ref 150–400)
RBC: 4.87 MIL/uL (ref 4.22–5.81)
RDW: 15.4 % (ref 11.5–15.5)
WBC: 8.6 10*3/uL (ref 4.0–10.5)
nRBC: 0 % (ref 0.0–0.2)

## 2021-09-03 MED ORDER — FENTANYL CITRATE (PF) 100 MCG/2ML IJ SOLN
INTRAMUSCULAR | Status: AC | PRN
  Administered 2021-09-03 (×2): 50 ug via INTRAVENOUS

## 2021-09-03 MED ORDER — FENTANYL CITRATE (PF) 100 MCG/2ML IJ SOLN
INTRAMUSCULAR | Status: AC
  Filled 2021-09-03: qty 2

## 2021-09-03 MED ORDER — MIDAZOLAM HCL 2 MG/2ML IJ SOLN
INTRAMUSCULAR | Status: AC | PRN
  Administered 2021-09-03 (×2): 1 mg via INTRAVENOUS

## 2021-09-03 MED ORDER — HYDRALAZINE HCL 20 MG/ML IJ SOLN
INTRAMUSCULAR | Status: AC
  Filled 2021-09-03: qty 1

## 2021-09-03 MED ORDER — LIDOCAINE HCL 1 % IJ SOLN
INTRAMUSCULAR | Status: AC
  Filled 2021-09-03: qty 20

## 2021-09-03 MED ORDER — IOHEXOL 300 MG/ML  SOLN
100.0000 mL | Freq: Once | INTRAMUSCULAR | Status: AC | PRN
  Administered 2021-09-03: 5 mL

## 2021-09-03 MED ORDER — MIDAZOLAM HCL 2 MG/2ML IJ SOLN
INTRAMUSCULAR | Status: AC
  Filled 2021-09-03: qty 2

## 2021-09-03 MED ORDER — LIDOCAINE HCL URETHRAL/MUCOSAL 2 % EX GEL
1.0000 "application " | Freq: Once | CUTANEOUS | Status: AC
  Administered 2021-09-03: 1 via TOPICAL
  Filled 2021-09-03: qty 6

## 2021-09-03 MED ORDER — FENTANYL CITRATE PF 50 MCG/ML IJ SOSY
100.0000 ug | PREFILLED_SYRINGE | Freq: Once | INTRAMUSCULAR | Status: AC
  Administered 2021-09-03: 100 ug via INTRAVENOUS
  Filled 2021-09-03: qty 2

## 2021-09-03 MED ORDER — HYDRALAZINE HCL 20 MG/ML IJ SOLN
INTRAMUSCULAR | Status: AC | PRN
  Administered 2021-09-03: 10 mg via INTRAVENOUS

## 2021-09-03 MED ORDER — LIDOCAINE HCL 1 % IJ SOLN
INTRAMUSCULAR | Status: AC | PRN
  Administered 2021-09-03: 5 mL

## 2021-09-03 NOTE — Procedures (Signed)
Interventional Radiology Procedure Note ? ?Procedure: Image guided cystostomy tube placement ? ?Findings: Please refer to procedural dictation for full description. 14 Fr pigtail suprapubic catheter placement yielding 500 mL clear urine, placed to bag drainage. ? ?Complications: None immediate ? ?Estimated Blood Loss: < 5 mL ? ?Recommendations: ?Keep to bag drainage. ?Plan to return to IR in 1 month for upsize to 16 Fr, or otherwise as per Urology. ? ? ?Ruthann Cancer, MD ?Pager: [phone] ? ? ? ?

## 2021-09-03 NOTE — ED Provider Notes (Signed)
?[place] ?Provider Note ? ? ?CSN: [id number] ?Arrival date & time: 09/03/21  1850 ? ?  ? ?History ? ?Chief Complaint  ?Patient presents with  ? Urinary Retention  ? ? ?CLESTON LAUTNER is a 61 y.o. male presenting emergency department with complaint of urinary retention.  The patient was seen earlier this morning at the emergency department with complaint of difficulty with urination.  He subsequently urinated 3 times since arriving in the ED and had no active complaints, was discharged with urology follow-up.  He returns complaining of worsening suprapubic pressure and difficulty and inability to urinate since leaving the emergency department.  He does have a history of prostate cancer s/p radioactive seed implant. ? ?Patient was also seen in 2021 in Ed for urinary retention, staff was not able to pass catheter coud? into urine, and urology was consulted, with difficulty passing catheter initially, until cystoscopy was performed. ? ?HPI ? ?  ? ?Home Medications ?Prior to Admission medications   ?Medication Sig Start Date End Date Taking? Authorizing Provider  ?albuterol (VENTOLIN HFA) 108 (90 Base) MCG/ACT inhaler Inhale 2 puffs into the lungs every 6 (six) hours as needed for wheezing or shortness of breath.  10/22/18   [provider]  ?aspirin EC 325 MG EC tablet Take 1 tablet (325 mg total) by mouth daily. 02/27/19   Geradine Girt, DO  ?atorvastatin (LIPITOR) 80 MG tablet Take 1 tablet (80 mg total) by mouth daily at 6 PM. 02/26/19   Geradine Girt, DO  ?ciprofloxacin (CIPRO) 500 MG tablet Take 1 tablet (500 mg total) by mouth 2 (two) times daily. 06/15/19   Milton Ferguson, MD  ?clopidogrel (PLAVIX) 75 MG tablet Take 1 tablet (75 mg total) by mouth daily. ?Patient not taking: Reported on 06/15/2019 02/26/19   Geradine Girt, DO  ?lisinopril (ZESTRIL) 40 MG tablet Take 1 tablet (40 mg total) by mouth every morning. 02/28/19   Geradine Girt, DO  ?   ? ?Allergies     ?Penicillins   ? ?Review of Systems   ?Review of Systems ? ?Physical Exam ?Updated Vital Signs ?BP (!) 175/105   Pulse 70   Temp 97.7 ?F (36.5 ?C) (Oral)   Resp 17   SpO2 99%  ?Physical Exam ?Constitutional:   ?   General: He is not in acute distress. ?HENT:  ?   Head: Normocephalic and atraumatic.  ?Eyes:  ?   Conjunctiva/sclera: Conjunctivae normal.  ?   Pupils: Pupils are equal, round, and reactive to light.  ?Cardiovascular:  ?   Rate and Rhythm: Normal rate and regular rhythm.  ?   Pulses: Normal pulses.  ?Pulmonary:  ?   Effort: Pulmonary effort is normal. No respiratory distress.  ?Abdominal:  ?   General: There is no distension.  ?   Tenderness: There is no abdominal tenderness.  ?Skin: ?   General: Skin is warm and dry.  ?Neurological:  ?   General: No focal deficit present.  ?   Mental Status: He is alert. Mental status is at baseline.  ?Psychiatric:     ?   Mood and Affect: Mood normal.     ?   Behavior: Behavior normal.  ? ? ?ED Results / Procedures / Treatments   ?Labs ?(all labs ordered are listed, but only abnormal results are displayed) ?Labs Reviewed  ?BASIC METABOLIC PANEL - Abnormal; Notable for the following components:  ?    Result Value  ? CO2  21 (*)   ? Glucose, Bld 123 (*)   ? All other components within normal limits  ?CBC WITH DIFFERENTIAL/PLATELET  ?URINALYSIS, ROUTINE W REFLEX MICROSCOPIC  ? ? ?EKG ?None ? ?Radiology ?CT ABDOMEN PELVIS WO CONTRAST ? ?Result Date: 09/03/2021 ?CLINICAL DATA:  Pre planning for suprapubic catheter placement EXAM: CT ABDOMEN AND PELVIS WITHOUT CONTRAST TECHNIQUE: Multidetector CT imaging of the abdomen and pelvis was performed following the standard protocol without IV contrast. RADIATION DOSE REDUCTION: This exam was performed according to the departmental dose-optimization program which includes automated exposure control, adjustment of the mA and/or kV according to patient size and/or use of iterative reconstruction technique. COMPARISON:  None.  FINDINGS: Lower Chest: Normal. Hepatobiliary: Normal hepatic contours. No intra- or extrahepatic biliary dilatation. The gallbladder is normal. Pancreas: Normal pancreas. No ductal dilatation or peripancreatic fluid collection. Spleen: Normal. Adrenals/Urinary Tract: The adrenal glands are normal. No hydronephrosis, nephroureterolithiasis or solid renal mass. The urinary bladder is normal for degree of distention Stomach/Bowel: There is no hiatal hernia. Normal duodenal course and caliber. No small bowel dilatation or inflammation. No focal colonic abnormality. The appendix is not visualized. No right lower quadrant inflammation or free fluid. Vascular/Lymphatic: There is calcific atherosclerosis of the abdominal aorta. No lymphadenopathy. Reproductive: Metallic seeds of the prostate.  Bilateral hydroceles. Other: None. Musculoskeletal: No bony spinal canal stenosis or focal osseous abnormality. IMPRESSION: 1. No acute abnormality of the abdomen or pelvis. 2. No unexpected finding ventral to the urinary bladder. 3. Bilateral hydroceles. Aortic Atherosclerosis (ICD10-I70.0). Electronically Signed   By: Ulyses Jarred M.D.   On: 09/03/2021 23:23   ? ?Procedures ?Procedures  ? ? ?Medications Ordered in ED ?Medications  ?lidocaine (XYLOCAINE) 1 % (with pres) injection (has no administration in time range)  ?fentaNYL (SUBLIMAZE) 100 MCG/2ML injection (has no administration in time range)  ?midazolam (VERSED) 2 MG/2ML injection (has no administration in time range)  ?fentaNYL (SUBLIMAZE) injection (50 mcg Intravenous Given 09/03/21 2321)  ?iohexol (OMNIPAQUE) 300 MG/ML solution 100 mL (has no administration in time range)  ?midazolam (VERSED) injection (1 mg Intravenous Given 09/03/21 2321)  ?hydrALAZINE (APRESOLINE) injection (10 mg Intravenous Given 09/03/21 2324)  ?hydrALAZINE (APRESOLINE) 20 MG/ML injection (has no administration in time range)  ?lidocaine (XYLOCAINE) 2 % jelly 1 application. (1 application. Topical Given  09/03/21 2101)  ?fentaNYL (SUBLIMAZE) injection 100 mcg (100 mcg Intravenous Given 09/03/21 2147)  ? ? ?ED Course/ Medical Decision Making/ A&P ?Clinical Course as of 09/03/21 2326  ?Mon Sep 03, 2021  ?2121 Repeat bladder scan approx 500 cc [MT]  ?2159 Dr Jeffie Pollock has requested that I contact IR to see if we can have a stat suprapubic line placed.  Paged Dr Serafina Royals [MT]  ?2206 Dr Serafina Royals IR has requested stat noncontrast CT abdomen and he will come place suprapubic line [MT]  ?  ?Clinical Course User Index ?[MT] Wyvonnia Dusky, MD  ? ?                        ?Medical Decision Making ?Amount and/or Complexity of Data Reviewed ?Radiology: ordered. ? ?Risk ?Prescription drug management. ? ? ?Patient is here with suspected urinary outlet obstruction, likely related to enlarged prostate and history of prostate cancer.  He is in significant distress on arrival, IV fentanyl was ordered for pain.  Multiple attempts were made to pass a coud? catheter but we were unsuccessful.  My review of his medical records, he had an extremely difficult time passing a coud?  in 2021 even by the urologist, ultimately regarding cystoscopy and use of wire w/ coude.  Given this fact, initially reach out to the urologist on-call, subsequently requested that I speak to the interventional radiologist about a stat suprapubic catheter.  The interventional radiologist was willing to do this and his team is coming into the hospital.  He requested we obtain a stat noncontrast CT scan, which was performed in the ED. ? ?Patient's pain was improved with fentanyl.  He remained stable.  Bladder scan initially showed approximate 500 cc of urine. ? ?Patient was seen in the ED earlier in the past 24 hours and had a urinalysis at that time which did not show any clear evidence of an infection.  He does not appear to have sepsis here.  CBC and BMP unremarkable per my review. ? ?CT scan personally viewed and interpreted, I agree with radiology interpretation,  showing no acute emergent findings. ? ?Anticipate discharge home after IR guided drain placed. ? ? ? ? ? ? ? ?Final Clinical Impression(s) / ED Diagnoses ?Final diagnoses:  ?Urinary retention  ? ? ?Rx / DC Orders ?ED

## 2021-09-03 NOTE — ED Provider Triage Note (Signed)
Emergency Medicine Provider Triage Evaluation Note ? ?Frank Fischer , a 61 y.o. male  was evaluated in triage.  Pt complains of urinary retention.  Patient was seen yesterday for same however was able to void 3 times while in the emergency department.  Work-up negative at that time and he was provided information for urology follow-up.  He states that he was able to void this morning however has not been able to since that time. ? ?Review of Systems  ?Positive: + urinary retention ?Negative: - fevers, chills ? ?Physical Exam  ?BP (!) 169/80   Pulse (!) 115   Temp 97.7 ?F (36.5 ?C) (Oral)   Resp 20   SpO2 100%  ?Gen:   Awake, no distress   ?Resp:  Normal effort  ?MSK:   Moves extremities without difficulty  ?Other:   ? ?Medical Decision Making  ?Medically screening exam initiated at 7:35 PM.  Appropriate orders placed.  Frank Fischer was informed that the remainder of the evaluation will be completed by another provider, this initial triage assessment does not replace that evaluation, and the importance of remaining in the ED until their evaluation is complete. ? ? ?  ?Eustaquio Maize, PA-C ?09/03/21 1936 ? ?

## 2021-09-03 NOTE — Discharge Instructions (Addendum)
You were seen in the ER today for problems urinating which resolved while at the hospital. Your labs and urine were reassuring- no signs of infection and your kidney function is normal.  ? ?Please follow up with primary care and urology.  ?Return to the ER for new or worsening symptoms including but not limited to new or worsening pain, inability to urinate, blood in your urine, fever, vomiting, or any other concerns.  ? ?Please have your blood pressure rechecked by primary care as it was elevated in the ER tonight.  ?

## 2021-09-03 NOTE — ED Triage Notes (Signed)
Pt states that he was here last night due to not being able to void, was able to void and left, voided this morning and has not went since. Hx of prostate cancer  ?

## 2021-09-03 NOTE — ED Notes (Signed)
Called for triage and no response ?

## 2021-09-03 NOTE — Discharge Instructions (Addendum)
You will need to follow-up with urologist in the office in the next day or two.  If you do not hear from their office by the afternoon tomorrow, please call the number above for Dr Irine Seal, who will see you in follow up this week. ?

## 2021-09-03 NOTE — ED Provider Notes (Signed)
?[place] ?Provider Note ? ? ?CSN: [id number] ?Arrival date & time: 09/02/21  2017 ? ?  ? ?History ? ?Chief Complaint  ?Patient presents with  ? Urinary Retention  ? ? ?Frank Fischer is a 61 y.o. male with a history of prostate cancer S/p radioactive seed implant, prior CVA, & substance use who presents to the ED due to urinary urgency earlier today. Patient reports earlier he had the urge to urinate but could not, states this was not painful. He relays that he was ultimately able to urinate on his own with relief of sxs. He has urinated 3 times since ED arrival. He has no current complaints. Denies fever, chills, vomiting, abdominal pain, flank pain, dysuria, or hematuria.  ? ?HPI ? ?  ? ?Home Medications ?Prior to Admission medications   ?Medication Sig Start Date End Date Taking? Authorizing Provider  ?albuterol (VENTOLIN HFA) 108 (90 Base) MCG/ACT inhaler Inhale 2 puffs into the lungs every 6 (six) hours as needed for wheezing or shortness of breath.  10/22/18   [provider]  ?aspirin EC 325 MG EC tablet Take 1 tablet (325 mg total) by mouth daily. 02/27/19   Geradine Girt, DO  ?atorvastatin (LIPITOR) 80 MG tablet Take 1 tablet (80 mg total) by mouth daily at 6 PM. 02/26/19   Geradine Girt, DO  ?ciprofloxacin (CIPRO) 500 MG tablet Take 1 tablet (500 mg total) by mouth 2 (two) times daily. 06/15/19   Milton Ferguson, MD  ?clopidogrel (PLAVIX) 75 MG tablet Take 1 tablet (75 mg total) by mouth daily. ?Patient not taking: Reported on 06/15/2019 02/26/19   Geradine Girt, DO  ?lisinopril (ZESTRIL) 40 MG tablet Take 1 tablet (40 mg total) by mouth every morning. 02/28/19   Geradine Girt, DO  ?   ? ?Allergies    ?Penicillins   ? ?Review of Systems   ?Review of Systems  ?Constitutional:  Negative for chills and fever.  ?Respiratory:  Negative for shortness of breath.   ?Cardiovascular:  Negative for chest pain.  ?Gastrointestinal:  Negative for abdominal pain and  vomiting.  ?Genitourinary:  Positive for difficulty urinating and urgency (resolved). Negative for enuresis, flank pain, hematuria, scrotal swelling and testicular pain.  ?All other systems reviewed and are negative. ? ?Physical Exam ?Updated Vital Signs ?BP (!) 184/78 (BP Location: Left Arm)   Pulse 82   Temp 98 ?F (36.7 ?C) (Oral)   Resp 17   SpO2 97%  ?Physical Exam ?Vitals and nursing note reviewed.  ?Constitutional:   ?   General: He is not in acute distress. ?   Appearance: He is well-developed. He is not toxic-appearing.  ?HENT:  ?   Head: Normocephalic and atraumatic.  ?Eyes:  ?   General:     ?   Right eye: No discharge.     ?   Left eye: No discharge.  ?   Conjunctiva/sclera: Conjunctivae normal.  ?Cardiovascular:  ?   Rate and Rhythm: Normal rate and regular rhythm.  ?Pulmonary:  ?   Effort: No respiratory distress.  ?   Breath sounds: Normal breath sounds. No wheezing or rales.  ?Abdominal:  ?   General: There is no distension.  ?   Palpations: Abdomen is soft.  ?   Tenderness: There is no abdominal tenderness. There is no right CVA tenderness, left CVA tenderness, guarding or rebound.  ?Musculoskeletal:  ?   Cervical back: Neck supple.  ?Skin: ?   General:  Skin is warm and dry.  ?Neurological:  ?   Mental Status: He is alert.  ?   Comments: Clear speech.   ?Psychiatric:     ?   Behavior: Behavior normal.  ? ? ?ED Results / Procedures / Treatments   ?Labs ?(all labs ordered are listed, but only abnormal results are displayed) ?Labs Reviewed  ?URINALYSIS, ROUTINE W REFLEX MICROSCOPIC - Abnormal; Notable for the following components:  ?    Result Value  ? Color, Urine STRAW (*)   ? Specific Gravity, Urine 1.004 (*)   ? All other components within normal limits  ?BASIC METABOLIC PANEL  ?CBC WITH DIFFERENTIAL/PLATELET  ? ? ?EKG ?None ? ?Radiology ?No results found. ? ?Procedures ?Procedures  ? ? ?Medications Ordered in ED ?Medications - No data to display ? ?ED Course/ Medical Decision Making/ A&P ?  ?                         ?Medical Decision Making ? ?Patient presents to the ED with complaints of difficulty urinating with urgency that is now resolved, this involves an extensive number of treatment options, and is a complaint that carries with it a high risk of complications and morbidity. Nontoxic, vitals w/ elevated BP- doubt HTN emergency- PCP recheck.   ? ?Additional history obtained:  ?Chart & nursing note reviewed.  ? ?Lab Tests:  ?I viewed & interpreted labs including:  ?CBC: unremarkable. ?BMP: Unremarkable. Renal function preserved.  ?UA: no UTI, no hematuria.  ? ?Patient's sxs resolved, he has urinated 3 times in the ED waiting room without difficulty. His urine does not show findings of infection or hematuria. His renal function is preserved. Overall seems reasonable for discharge. Hx of prostate cancer, states he was lost to follow up due to insurance issues, consult placed to [facility] to try to help with this. I discussed results, treatment plan, need for follow-up, and return precautions with the patient and his fiance. Provided opportunity for questions, patient and his fiance confirmed understanding and are in agreement with plan.  ? ?Based on patient's chief complaint, I considered admission might be necessary, however after reassuring ED workup feel patient is reasonable for discharge.  ? ?Social determinants:  ?- Difficulty with insurance and follow up- consult placed to TOC. Urology information provided to patient. PCP follow up discussed as well.  ? ?Portions of this note were generated with [employer]. Dictation errors may occur despite best attempts at proofreading. ? ? ?Final Clinical Impression(s) / ED Diagnoses ?Final diagnoses:  ?Urinary urgency  ?Hypertension, unspecified type  ? ? ?Rx / DC Orders ?ED Discharge Orders   ? ? None  ? ?  ? ? ?  ?Amaryllis Dyke, PA-C ?09/03/21 4158 ? ?  ?Ripley Fraise, MD ?09/03/21 [phone] ? ?

## 2021-09-03 NOTE — ED Notes (Signed)
Pt transported to CT then IR for placement of suprapubic catheter.  IR states they think pt will come back to ED unless he gets a bed first.  ?

## 2021-09-03 NOTE — Consult Note (Signed)
? ?Chief Complaint: ?Patient was seen in consultation today for acute on chronic bladder outlet obstruction ? ?Referring Physician(s): ?Irine Seal, MD ? ?Patient Status: [facility] - ED ? ?History of Present Illness: ?Frank Fischer is a 61 y.o. male with history of prostate cancer status post radioactive seed placement in 2019 who presents to the [facility] ED with acute on chronic urinary retention.  He reports not urinating the past 14 hrs.  He presented for similar symptoms yesterday, and was able to void some spontaneously.  Prior notes from Urology mention symptoms of urinary retention over the past several years requiring intermittent Foley dependence, which he has not had recently.   ? ?He states extreme discomfort.  No fevers, chills, nausea, vomiting.  Has not noticed blood in urine recently.  Urology reports several traumatic coude catheter placements recently, some unsuccessful. ? ?Past Medical History:  ?Diagnosis Date  ? Arthritis   ? Cervical stenosis of spine   ? Chronic headaches   ? Dental caries   ? periodontal disease  ? Frequency of urination   ? History of cerebral infarction 11-28-2017  per pt never had symptoms  ? per MRI imaging 10-31-2016 2 small chronic lacunar infartions in the right frontal periventricular white matter , and chronic left medial orbital fracture (as seen 2014 MRI)  ? Hypertension   ? Noncompliance with medication regimen   ? Prostate cancer [facility]) urologist-  dr winter/  oncologist-  dr Tammi Klippel  ? dx 07-21-2017--- Stage T1c, Gleason 4+3,  PSA 13.40,  vol 49.7cc--  scheduled for radioactive seed implants 12-03-2017  ? ? ?Past Surgical History:  ?Procedure Laterality Date  ? ABDOMINAL EXPLORATION SURGERY  age 64  ? per pt had Small Bowel Resection and Appendectomy  ? APPENDECTOMY    ? [place] Left 1999  ? REPAIR INJURY-- "ARM CUT ABOUT OFF"  ? PROSTATE BIOPSY  07/21/2017  dr winter office  ? RADIOACTIVE SEED IMPLANT N/A 12/03/2017  ? Procedure: RADIOACTIVE SEED  IMPLANT/BRACHYTHERAPY IMPLANT;  Surgeon: Ceasar Mons, MD;  Location[facility];  Service: Urology;  Laterality: N/A;  ONLY NEEDS 120 MIN  ? SPACE OAR INSTILLATION N/A 12/03/2017  ? Procedure: SPACE OAR INSTILLATION;  Surgeon: Ceasar Mons, MD;  Location: [facility];  Service: Urology;  Laterality: N/A;  ? TOOTH EXTRACTION N/A 09/21/2018  ? Procedure: DENTAL EXTRACTIONS WITH ALVEOLIPLASTY;  Surgeon: Diona Browner, DDS;  Location: [place];  Service: Oral Surgery;  Laterality: N/A;  ? ? ?Allergies: ?Penicillins ? ?Medications: ?Prior to Admission medications   ?Medication Sig Start Date End Date Taking? Authorizing Provider  ?albuterol (VENTOLIN HFA) 108 (90 Base) MCG/ACT inhaler Inhale 2 puffs into the lungs every 6 (six) hours as needed for wheezing or shortness of breath.  10/22/18   [provider]  ?aspirin EC 325 MG EC tablet Take 1 tablet (325 mg total) by mouth daily. 02/27/19   Geradine Girt, DO  ?atorvastatin (LIPITOR) 80 MG tablet Take 1 tablet (80 mg total) by mouth daily at 6 PM. 02/26/19   Geradine Girt, DO  ?ciprofloxacin (CIPRO) 500 MG tablet Take 1 tablet (500 mg total) by mouth 2 (two) times daily. 06/15/19   Milton Ferguson, MD  ?clopidogrel (PLAVIX) 75 MG tablet Take 1 tablet (75 mg total) by mouth daily. ?Patient not taking: Reported on 06/15/2019 02/26/19   Geradine Girt, DO  ?lisinopril (ZESTRIL) 40 MG tablet Take 1 tablet (40 mg total) by mouth every morning. 02/28/19  Geradine Girt, DO  ?  ? ?Family History  ?Problem Relation Age of Onset  ? Cancer Mother   ? Diabetes Paternal Grandfather   ? ? ?Social History  ? ?Socioeconomic History  ? Marital status: Single  ?  Spouse name: Not on file  ? Number of children: 2  ? Years of education: 4  ? Highest education level: Not on file  ?Occupational History  ? Occupation: N/A  ?Tobacco Use  ? Smoking status: Every Day  ?  Packs/day: 0.50  ?  Years: 40.00  ?  Pack years: 20.00  ?   Types: Cigarettes  ? Smokeless tobacco: Never  ?Vaping Use  ? Vaping Use: Never used  ?Substance and Sexual Activity  ? Alcohol use: Yes  ?  Alcohol/week: 21.0 standard drinks  ?  Types: 21 Cans of beer per week  ?  Comment: 1-2 12oz  beers every evening  ? Drug use: Yes  ?  Types: Marijuana  ?  Comment:  per pt uses once week  ? Sexual activity: Yes  ?Other Topics Concern  ? Not on file  ?Social History Narrative  ? Lives at home alone  ? Right-handed  ? Caffeine: tea throughout the day  ? 03/04/19 [name] is a 61 year old male who is single living at home alone in his apartment. He voices his support system is his fiance, [name] and brother Zoltan Genest. He denies issues with transportation to medical appointments. He is independent in his care needs  ? ?Social Determinants of Health  ? ?Financial Resource Strain: Not on file  ?Food Insecurity: Not on file  ?Transportation Needs: Not on file  ?Physical Activity: Not on file  ?Stress: Not on file  ?Social Connections: Not on file  ? ? ?Review of Systems: A 12 point ROS discussed and pertinent positives are indicated in the HPI above.  All other systems are negative. ? ?Vital Signs: ?BP (!) 185/93   Pulse 85   Temp 97.7 ?F (36.5 ?C) (Oral)   Resp 16   SpO2 97%  ? ?Physical Exam ?Constitutional:   ?   General: He is in acute distress.  ?   Appearance: He is diaphoretic.  ?HENT:  ?   Head: Normocephalic.  ?   Mouth/Throat:  ?   Mouth: Mucous membranes are moist.  ?   Comments: MP2 ? ?Eyes:  ?   General: No scleral icterus. ?Cardiovascular:  ?   Rate and Rhythm: Normal rate and regular rhythm.  ?Pulmonary:  ?   Breath sounds: Normal breath sounds.  ?Abdominal:  ?   General: There is no distension.  ?Skin: ?   General: Skin is warm.  ?Neurological:  ?   Mental Status: He is alert and oriented to person, place, and time.  ? ? ?Imaging: ?CT AP 09/03/21 ? ? ?Labs: ? ?CBC: ?Recent Labs  ?  09/02/21 ?2042 09/03/21 ?2016  ?WBC 6.4 8.6  ?HGB 15.7 16.2   ?HCT 45.3 46.4  ?PLT 280 291  ? ? ?COAGS: ?No results for input(s): INR, APTT in the last 8760 hours. ? ?BMP: ?Recent Labs  ?  09/02/21 ?2042 09/03/21 ?2016  ?NA 139 141  ?K 3.9 4.1  ?CL 109 111  ?CO2 22 21*  ?GLUCOSE 99 123*  ?BUN 11 13  ?CALCIUM 9.3 9.6  ?CREATININE 1.02 1.14  ?GFRNONAA >60 >60  ? ? ?LIVER FUNCTION TESTS: ?No results for input(s): BILITOT, AST, ALT, ALKPHOS, PROT, ALBUMIN in  the last 8760 hours. ? ?TUMOR MARKERS: ?No results for input(s): AFPTM, CEA, CA199, CHROMGRNA in the last 8760 hours. ? ?Assessment and Plan: ?61 year old male with history of prostate cancer status post radioactive bead placement in 2019 with worsening chronic urinary retention, now obstructive for >14 hrs.  Given history of multiple prior traumatic and unsuccessful transureteral catheter placements, suprapubic catheter placement is requested.  Plan for image guided suprapubic catheter placement with moderate sedation. ? ?Risks and benefits discussed with the patient including bleeding, infection, damage to adjacent structures, bowel perforation/fistula connection, and sepsis. ? ?All of the patient's questions were answered, patient is agreeable to proceed. ?Consent signed and in chart. ? ? ? ?Electronically Signed: ?Suzette Battiest, MD ?09/03/2021, 10:52 PM ? ? ?I spent a total of 20 Minutes  in face to face in clinical consultation, greater than 50% of which was counseling/coordinating care for acute bladder outlet obstruction. ? ?

## 2021-09-04 NOTE — ED Notes (Signed)
DC instructions reviewed with pt. Pt verbalized understanding.  PT DC.  

## 2021-09-04 NOTE — ED Notes (Signed)
Attempted to place regular foley and coude foley unsuccessfully by tech and two RN   ?

## 2021-10-28 ENCOUNTER — Emergency Department (HOSPITAL_COMMUNITY)
Admission: EM | Admit: 2021-10-28 | Discharge: 2021-10-28 | Disposition: A | Discharge status: Home / Self Care | Payer: Medicaid Other | Attending: Emergency Medicine | Admitting: Emergency Medicine

## 2021-10-28 ENCOUNTER — Other Ambulatory Visit: Payer: Self-pay

## 2021-10-28 ENCOUNTER — Encounter (HOSPITAL_COMMUNITY): Payer: Self-pay | Admitting: Emergency Medicine

## 2021-10-28 ENCOUNTER — Emergency Department (HOSPITAL_COMMUNITY): Payer: Medicaid Other

## 2021-10-28 DIAGNOSIS — T83098A Other mechanical complication of other indwelling urethral catheter, initial encounter: Secondary | ICD-10-CM | POA: Insufficient documentation

## 2021-10-28 DIAGNOSIS — R339 Retention of urine, unspecified: Secondary | ICD-10-CM | POA: Diagnosis not present

## 2021-10-28 DIAGNOSIS — T83010A Breakdown (mechanical) of cystostomy catheter, initial encounter: Secondary | ICD-10-CM

## 2021-10-28 DIAGNOSIS — Z7982 Long term (current) use of aspirin: Secondary | ICD-10-CM | POA: Insufficient documentation

## 2021-10-28 DIAGNOSIS — Z7902 Long term (current) use of antithrombotics/antiplatelets: Secondary | ICD-10-CM | POA: Diagnosis not present

## 2021-10-28 HISTORY — PX: IR CATHETER TUBE CHANGE: IMG717

## 2021-10-28 MED ORDER — LIDOCAINE VISCOUS HCL 2 % MT SOLN
OROMUCOSAL | Status: DC
Start: 2021-10-28 — End: 2021-10-28
  Filled 2021-10-28: qty 15

## 2021-10-28 MED ORDER — LIDOCAINE HCL 1 % IJ SOLN
INTRAMUSCULAR | Status: AC
  Filled 2021-10-28: qty 20

## 2021-10-28 MED ORDER — LIDOCAINE HCL 1 % IJ SOLN
INTRAMUSCULAR | Status: DC | PRN
  Administered 2021-10-28: 10 mL

## 2021-10-28 MED ORDER — IOHEXOL 300 MG/ML  SOLN
100.0000 mL | Freq: Once | INTRAMUSCULAR | Status: AC | PRN
  Administered 2021-10-28: 5 mL

## 2021-10-28 MED ORDER — LIDOCAINE VISCOUS HCL 2 % MT SOLN
OROMUCOSAL | Status: DC | PRN
  Administered 2021-10-28: 15 mL via OROMUCOSAL

## 2021-10-28 NOTE — ED Notes (Signed)
MD attempted to place suprapubic catheter but opening has closed and unable to advance catheter.

## 2021-10-28 NOTE — Discharge Instructions (Signed)
Please follow-up with your urologist.  Per IR, recommend fluoroscopic guided exchange and up sizing of this suprapubic drainage catheter in approximately 6-8 weeks.  Please call interventional radiology to schedule this follow-up.

## 2021-10-28 NOTE — ED Notes (Signed)
Transported to IR 

## 2021-10-28 NOTE — Procedures (Signed)
  Procedure:  Replace suprapubic cath via existing tract 67fPreprocedure diagnosis: The encounter diagnosis was Suprapubic catheter dysfunction, initial encounter (HCrouch.  Postprocedure diagnosis: same EBL:    minimal Complications:   none immediate  See full dictation in CB[facility]'s  DDillard CannonMD Main # [phone]  [phone] [phone]

## 2021-10-28 NOTE — ED Triage Notes (Signed)
Pt reported to ED for evaluation after suprapubic catheter came out this evening. Pt states he was using restroom when incident occurred. Denies any pain or other symptoms at this time.

## 2021-10-28 NOTE — ED Provider Notes (Addendum)
[facility] EMERGENCY DEPARTMENT Provider Note   CSN: [id number] Arrival date & time: 10/28/21  0041     History  Chief Complaint  Patient presents with   Urinary Retention    Frank Fischer is a 61 y.o. male.  HPI  61 year old male with a history of chronic urinary retention with a suprapubic catheter in place who presents to the emergency department after suprapubic catheter fell out.  The patient states that his suprapubic catheter came out yesterday evening.  He had been seen outpatient with urology and had initially had plans to have it taken out.  He states that he is now able to void and has been voiding while waiting in triage in the emergency department.  He denies any abdominal pain, burning while urinating, increased urinary frequency, fever or chills.  Home Medications Prior to Admission medications   Medication Sig Start Date End Date Taking? Authorizing Provider  albuterol (VENTOLIN HFA) 108 (90 Base) MCG/ACT inhaler Inhale 2 puffs into the lungs every 6 (six) hours as needed for wheezing or shortness of breath.  10/22/18   [provider]  aspirin EC 325 MG EC tablet Take 1 tablet (325 mg total) by mouth daily. 02/27/19   Geradine Girt, DO  atorvastatin (LIPITOR) 80 MG tablet Take 1 tablet (80 mg total) by mouth daily at 6 PM. 02/26/19   Geradine Girt, DO  ciprofloxacin (CIPRO) 500 MG tablet Take 1 tablet (500 mg total) by mouth 2 (two) times daily. 06/15/19   Milton Ferguson, MD  clopidogrel (PLAVIX) 75 MG tablet Take 1 tablet (75 mg total) by mouth daily. Patient not taking: Reported on 06/15/2019 02/26/19   Geradine Girt, DO  lisinopril (ZESTRIL) 40 MG tablet Take 1 tablet (40 mg total) by mouth every morning. 02/28/19   Geradine Girt, DO      Allergies    Penicillins    Review of Systems   Review of Systems  All other systems reviewed and are negative.   Physical Exam Updated Vital Signs BP (!) 141/77   Pulse (!) 58   Temp  98.6 F (37 C) (Oral)   Resp 16   SpO2 99%  Physical Exam Vitals and nursing note reviewed.  Constitutional:      General: He is not in acute distress.    Appearance: He is well-developed.  HENT:     Head: Normocephalic and atraumatic.  Eyes:     Conjunctiva/sclera: Conjunctivae normal.  Cardiovascular:     Rate and Rhythm: Normal rate and regular rhythm.  Pulmonary:     Effort: Pulmonary effort is normal. No respiratory distress.     Breath sounds: Normal breath sounds.  Abdominal:     Palpations: Abdomen is soft.     Tenderness: There is no abdominal tenderness.     Comments: Suprapubic ostomy site C/C/I, not draining urine actively, no abdominal tenderness  Musculoskeletal:        General: No swelling.     Cervical back: Neck supple.  Skin:    General: Skin is warm and dry.     Capillary Refill: Capillary refill takes less than 2 seconds.  Neurological:     Mental Status: He is alert.  Psychiatric:        Mood and Affect: Mood normal.     ED Results / Procedures / Treatments   Labs (all labs ordered are listed, but only abnormal results are displayed) Labs Reviewed - No data to display  EKG None  Radiology IR Catheter Tube Change  Result Date: 10/28/2021 INDICATION: Inadvertent removal of suprapubic catheter during voiding trial, cannot be replaced bedside EXAM: FLUOROSCOPIC GUIDED SUPRAPUBIC CATHETER REPLACEMENT COMPARISON:  None Available. MEDICATIONS: No periprocedural antibiotics were indicated ANESTHESIA/SEDATION: Viscous lidocaine topical, 1% lidocaine subcutaneous CONTRAST:  10 mL Omnipaque 144 COMPLICATIONS: None immediate. PROCEDURE: The procedure, risks, benefits, and alternatives were explained to the patient. Questions regarding the procedure were encouraged and answered. The patient understands and consents to the procedure. A timeout was performed prior to the initiation of the procedure. The patient was positioned supine the skin overlying the previous  entry site was prepped and draped in usual sterile fashion. Under real-time fluoroscopic guidance, angled Kumpe catheter advanced through the tract using small contrast injections, and tell access to the lumen of the urinary bladder was achieved. Catheter exchanged over guidewire for serial vascular dilators which facilitated advancement of a 14 French pigtail drain catheter, formed centrally within the lumen of the urinary bladder. Contrast injection confirms good positioning. The catheter was connected to a gravity bag. The exit site of the catheter was secured with a 0 Prolene suture. A dressing was placed. The patient tolerated the procedure well without immediate postprocedural complication. FINDINGS: The tract from previous placement could be cannulated allowing placement of a new 14 [place] device. IMPRESSION: Technically successful fluoroscopy guided replacement of a 14 French suprapubic catheter. PLAN: Recommend fluoroscopic guided exchange and up sizing of this suprapubic drainage catheter in approximately 6-8 weeks. Electronically Signed   By: Lucrezia Europe M.D.   On: 10/28/2021 12:17    Procedures SUPRAPUBIC TUBE PLACEMENT  Date/Time: 10/28/2021 12:28 PM  Performed by: Regan Lemming, MD Authorized by: Regan Lemming, MD   Consent:    Consent obtained:  Verbal   Consent given by:  Patient   Risks discussed:  Bleeding, bowel perforation, infection and pain   Alternatives discussed:  No treatment Universal protocol:    Patient identity confirmed:  Verbally with patient and arm band Sedation:    Sedation type:  None Anesthesia:    Anesthesia method:  None Procedure details:    Complexity:  Simple   Catheter type:  Foley   Catheter size:  14 Fr   Ultrasound guidance: no     Number of attempts:  1 Post-procedure details:    Procedure completion:  Procedure terminated electively by provider Comments:     Unsuccessful insertion of a 31 French suprapubic catheter due to closure of the  tract and patient pain and discomfort     Medications Ordered in ED Medications  lidocaine (XYLOCAINE) 1 % (with pres) injection (  Canceled Entry 10/28/21 1200)  lidocaine (XYLOCAINE) 2 % viscous mouth solution (15 mLs Mouth/Throat Given 10/28/21 1155)  iohexol (OMNIPAQUE) 300 MG/ML solution 100 mL (5 mLs Per Tube Contrast Given 10/28/21 1204)    ED Course/ Medical Decision Making/ A&P                           Medical Decision Making  61 year old male with a history of chronic urinary retention with a suprapubic catheter in place who presents to the emergency department after suprapubic catheter fell out.  The patient states that his suprapubic catheter came out yesterday evening.  He had been seen outpatient with urology and had initially had plans to have it taken out.  He states that he is now able to void and has been voiding while waiting in triage in  the emergency department.  He denies any abdominal pain, burning while urinating, increased urinary frequency, fever or chills.  On arrival, the patient was vitally stable.  He is previously had difficulty with urinary retention in the past and has had problems passing suprapubic catheters previously.  During his last ED visit on 09/03/2021 he had an emergent suprapubic catheter placed with IR.  He has since followed outpatient with urology and had plans to have the suprapubic catheter taken out.  He states that he has had no difficulty voiding and has voided multiple times in the emergency department naturally.  The patient has been able to urinate here in the ED but has been only urinating occasionally "dribbles" and did fail a voiding trial in clinic with urology within the last few weeks.  I did speak with the on-call urology resident who recommended attempting to replace the suprapubic catheter bedside.  Attempts were made to place a 14 French suprapubic catheter and were unsuccessful due to closure of the tract and pain and discomfort  experienced by the patient.  After discussion with urology, recommendations included reconsultation of interventional radiology for replacement of his suprapubic catheter.  After discussion with interventional radiology, the patient was subsequently taken to the IR lab where a 64 French suprapubic catheter was successfully placed.  The patient was stable for discharge and advised to follow-up with IR for upsizing in 6 to 8 weeks, advised outpatient follow-up with urology.  Final Clinical Impression(s) / ED Diagnoses Final diagnoses:  Suprapubic catheter dysfunction, initial encounter [facility])  Urinary retention    Rx / DC Orders ED Discharge Orders     None         Regan Lemming, MD 10/28/21 1228    Regan Lemming, MD 10/28/21 1229

## 2021-10-28 NOTE — ED Notes (Signed)
Patient back from IR.

## 2021-10-28 NOTE — ED Notes (Signed)
Patient was able to void normally and bladder scan was 2m after voiding.

## 2022-07-09 ENCOUNTER — Ambulatory Visit (INDEPENDENT_AMBULATORY_CARE_PROVIDER_SITE_OTHER): Payer: Medicaid Other | Admitting: Adult Health

## 2022-07-09 ENCOUNTER — Encounter: Payer: Self-pay | Admitting: Adult Health

## 2022-07-09 VITALS — BP 160/102 | HR 67 | Temp 97.7°F | Ht 70.0 in | Wt 216.8 lb

## 2022-07-09 DIAGNOSIS — E785 Hyperlipidemia, unspecified: Secondary | ICD-10-CM

## 2022-07-09 DIAGNOSIS — I10 Essential (primary) hypertension: Secondary | ICD-10-CM | POA: Diagnosis not present

## 2022-07-09 DIAGNOSIS — E7439 Other disorders of intestinal carbohydrate absorption: Secondary | ICD-10-CM

## 2022-07-09 DIAGNOSIS — C61 Malignant neoplasm of prostate: Secondary | ICD-10-CM | POA: Diagnosis not present

## 2022-07-09 DIAGNOSIS — R5382 Chronic fatigue, unspecified: Secondary | ICD-10-CM | POA: Diagnosis not present

## 2022-07-09 MED ORDER — AMLODIPINE BESYLATE 5 MG PO TABS: 5.0000 mg | ORAL_TABLET | Freq: Every day | ORAL | 0 refills | Status: DC

## 2022-07-09 MED ORDER — LISINOPRIL 40 MG PO TABS: 40.0000 mg | ORAL_TABLET | Freq: Every morning | ORAL | 0 refills | Status: DC

## 2022-07-09 MED ORDER — ALBUTEROL SULFATE HFA 108 (90 BASE) MCG/ACT IN AERS: 2.0000 | INHALATION_SPRAY | Freq: Four times a day (QID) | RESPIRATORY_TRACT | 3 refills | Status: DC | PRN

## 2022-07-09 MED ORDER — ATORVASTATIN CALCIUM 80 MG PO TABS: 80.0000 mg | ORAL_TABLET | Freq: Every day | ORAL | 0 refills | Status: DC

## 2022-07-09 NOTE — Progress Notes (Signed)
Location:  [place] of Service:   clinic    CODE STATUS: full   Allergies  Allergen Reactions   Penicillins Itching    Did it involve swelling of the face/tongue/throat, SOB, or low BP? No Did it involve sudden or severe rash/hives, skin peeling, or any reaction on the inside of your mouth or nose? Yes Did you need to seek medical attention at a hospital or doctor's office? No When did it last happen?      09/2018 If all above answers are "NO", may proceed with cephalosporin use.    Chief Complaint  Patient presents with   New Patient (Initial Visit)    Patient presents today for a new patient appointment    HPI:  He is a 62 year old man who is here to establish care. His medical history includes; CVA X2; prostate cancer status post seed placement cervical stenosis of spine. He is complaining of neck stiffness with pain on the left side of his neck. He has chronic headaches as well. He will need to follow up with urology for his prostate. He will need blood work today and will need to follow up in 1 month.   Past Medical History:  Diagnosis Date   Arthritis    Cervical stenosis of spine    Chronic headaches    Dental caries    periodontal disease   Frequency of urination    History of cerebral infarction 11-28-2017  per pt never had symptoms   per MRI imaging 10-31-2016 2 small chronic lacunar infartions in the right frontal periventricular white matter , and chronic left medial orbital fracture (as seen 2014 MRI)   Hypertension    Noncompliance with medication regimen    Prostate cancer [facility]) urologist-  dr winter/  oncologist-  dr Tammi Klippel   dx 07-21-2017--- Stage T1c, Gleason 4+3,  PSA 13.40,  vol 49.7cc--  scheduled for radioactive seed implants 12-03-2017    Past Surgical History:  Procedure Laterality Date   ABDOMINAL EXPLORATION SURGERY  age 12   per pt had Small Bowel Resection and Appendectomy   APPENDECTOMY     ARM SURGERY Left 1999    REPAIR INJURY-- "ARM CUT ABOUT OFF"   IR CATHETER TUBE CHANGE  10/28/2021   IR GUIDED DRAIN W CATHETER PLACEMENT  09/03/2021   IR US GUIDANCE  09/03/2021   PROSTATE BIOPSY  07/21/2017  dr winter office   RADIOACTIVE SEED IMPLANT N/A 12/03/2017   Procedure: RADIOACTIVE SEED IMPLANT/BRACHYTHERAPY IMPLANT;  Surgeon: Ceasar Mons, MD;  Location: [facility];  Service: Urology;  Laterality: N/A;  ONLY NEEDS 120 MIN   SPACE OAR INSTILLATION N/A 12/03/2017   Procedure: SPACE OAR INSTILLATION;  Surgeon: Ceasar Mons, MD;  Location: [facility];  Service: Urology;  Laterality: N/A;   TOOTH EXTRACTION N/A 09/21/2018   Procedure: DENTAL EXTRACTIONS WITH ALVEOLIPLASTY;  Surgeon: Diona Browner, DDS;  Location: [place];  Service: Oral Surgery;  Laterality: N/A;    Social History   Socioeconomic History   Marital status: Single    Spouse name: Not on file   Number of children: 2   Years of education: 12   Highest education level: Not on file  Occupational History   Occupation: N/A  Tobacco Use   Smoking status: Every Day    Packs/day: 0.50    Years: 40.00    Total pack years: 20.00    Types: Cigarettes   Smokeless tobacco:  Never  Vaping Use   Vaping Use: Never used  Substance and Sexual Activity   Alcohol use: Yes    Alcohol/week: 21.0 standard drinks of alcohol    Types: 21 Cans of beer per week    Comment: 1-2 12oz  beers every evening   Drug use: Yes    Types: Marijuana    Comment:  per pt uses once week   Sexual activity: Yes  Other Topics Concern   Not on file  Social History Narrative   Lives at home alone   Right-handed   Caffeine: tea throughout the day   03/04/19 Frank Fischer is a 63 year old male who is single living at home alone in his apartment. He voices his support system is his fiance, Frank Fischer and brother Frank Fischer. He denies issues with transportation to medical appointments. He is independent in his care  needs   Social Determinants of Health   Financial Resource Strain: Not on file  Food Insecurity: No Food Insecurity (03/04/2019)   Hunger Vital Sign    Worried About Running Out of Food in the Last Year: Never true    Ran Out of Food in the Last Year: Never true  Transportation Needs: No Transportation Needs (03/04/2019)   PRAPARE - Hydrologist (Medical): No    Lack of Transportation (Non-Medical): No  Physical Activity: Not on file  Stress: Not on file  Social Connections: Unknown (03/04/2019)   Social Connection and Isolation Panel [NHANES]    Frequency of Communication with Friends and Family: Three times a week    Frequency of Social Gatherings with Friends and Family: Three times a week    Attends Religious Services: 1 to 4 times per year    Active Member of Clubs or Organizations: Not on file    Attends Archivist Meetings: Not on file    Marital Status: Never married  Intimate Partner Violence: Not on file   Family History  Problem Relation Age of Onset   Lung cancer Mother    Hypertension Brother    Diabetes Paternal Grandfather    Prostate cancer Cousin       VITAL SIGNS BP (!) 160/102   Pulse 67   Temp 97.7 F (36.5 C)   Ht 5' 10"$  (1.778 m)   Wt 216 lb 12.8 oz (98.3 kg)   SpO2 96%   BMI 31.11 kg/m   Outpatient Encounter Medications as of 07/09/2022  Medication Sig   amLODipine (NORVASC) 5 MG tablet Take 1 tablet (5 mg total) by mouth daily.   ASPIRIN 81 PO Take 81 mg by mouth.   [DISCONTINUED] albuterol (VENTOLIN HFA) 108 (90 Base) MCG/ACT inhaler Inhale 2 puffs into the lungs every 6 (six) hours as needed for wheezing or shortness of breath.    [DISCONTINUED] atorvastatin (LIPITOR) 80 MG tablet Take 1 tablet (80 mg total) by mouth daily at 6 PM.   [DISCONTINUED] lisinopril (ZESTRIL) 40 MG tablet Take 1 tablet (40 mg total) by mouth every morning.   albuterol (VENTOLIN HFA) 108 (90 Base) MCG/ACT inhaler Inhale 2  puffs into the lungs every 6 (six) hours as needed for wheezing or shortness of breath.   atorvastatin (LIPITOR) 80 MG tablet Take 1 tablet (80 mg total) by mouth daily at 6 PM.   lisinopril (ZESTRIL) 40 MG tablet Take 1 tablet (40 mg total) by mouth every morning.   [DISCONTINUED] aspirin EC 325 MG EC tablet Take 1  tablet (325 mg total) by mouth daily.   [DISCONTINUED] ciprofloxacin (CIPRO) 500 MG tablet Take 1 tablet (500 mg total) by mouth 2 (two) times daily.   [DISCONTINUED] clopidogrel (PLAVIX) 75 MG tablet Take 1 tablet (75 mg total) by mouth daily. (Patient not taking: Reported on 06/15/2019)   No facility-administered encounter medications on file as of 07/09/2022.     SIGNIFICANT DIAGNOSTIC EXAMS   Review of Systems  Constitutional:  Negative for malaise/fatigue.  Respiratory:  Negative for cough and shortness of breath.   Cardiovascular:  Negative for chest pain, palpitations and leg swelling.  Gastrointestinal:  Negative for abdominal pain, constipation and heartburn.  Musculoskeletal:  Positive for joint pain. Negative for back pain and myalgias.       Has left neck pain with limited range of motion Hands cramp for the past years is getting worse   Skin: Negative.   Neurological:  Positive for dizziness and headaches.       Past seven years; has ringing in ears  Has had headaches for "years" on left side of face   Psychiatric/Behavioral:  The patient is not nervous/anxious.      Physical Exam Constitutional:      General: He is not in acute distress.    Appearance: He is well-developed. He is obese. He is not diaphoretic.  Neck:     Thyroid: No thyromegaly.  Cardiovascular:     Rate and Rhythm: Normal rate and regular rhythm.     Heart sounds: Normal heart sounds.  Pulmonary:     Effort: Pulmonary effort is normal. No respiratory distress.     Breath sounds: Normal breath sounds.  Abdominal:     General: Bowel sounds are normal. There is no distension.      Palpations: Abdomen is soft.     Tenderness: There is no abdominal tenderness.  Musculoskeletal:        General: Normal range of motion.     Cervical back: Neck supple.  Lymphadenopathy:     Cervical: No cervical adenopathy.  Skin:    General: Skin is warm and dry.  Neurological:     Mental Status: He is alert and oriented to person, place, and time.     Comments: Has limited range of motion in neck; neck muscles are tight  Unable to to put ear to shoulder   Psychiatric:        Mood and Affect: Mood normal.      ASSESSMENT/ PLAN:  TODAY  Essential hypertension benign: is worse b/p 160/*102 will add norvasc 5 mg daily RX written Prostate cancer: will have him follow up with urology Chronic fatigue Hyperlipidemia unspecified hyperlipidemia type Glucose intolerance   Will get: cbc; cmp; lipids; tsh vitamin B 12; hgb A1c; psa    Frank Edwards NP [facility] Adult Medicine   call [phone]

## 2022-07-09 NOTE — Patient Instructions (Signed)
We have tested today: cbc; cmp; tsh vitamin b12; cholesterol panel; psa  We have ordered a follow up for urology for you prostate cancer  You will need to return to clinic in one month

## 2022-07-10 LAB — COMPLETE METABOLIC PANEL WITH GFR
AG Ratio: 1.6 (calc) (ref 1.0–2.5)
ALT: 18 U/L (ref 9–46)
AST: 21 U/L (ref 10–35)
Albumin: 4.2 g/dL (ref 3.6–5.1)
Alkaline phosphatase (APISO): 102 U/L (ref 35–144)
BUN: 14 mg/dL (ref 7–25)
CO2: 26 mmol/L (ref 20–32)
Calcium: 9.5 mg/dL (ref 8.6–10.3)
Chloride: 107 mmol/L (ref 98–110)
Creat: 1.32 mg/dL (ref 0.70–1.35)
Globulin: 2.6 g/dL (calc) (ref 1.9–3.7)
Glucose, Bld: 101 mg/dL — ABNORMAL HIGH (ref 65–99)
Potassium: 4.1 mmol/L (ref 3.5–5.3)
Sodium: 142 mmol/L (ref 135–146)
Total Bilirubin: 0.3 mg/dL (ref 0.2–1.2)
Total Protein: 6.8 g/dL (ref 6.1–8.1)
eGFR: 61 mL/min/{1.73_m2} (ref >=60)

## 2022-07-10 LAB — LIPID PANEL
Cholesterol: 152 mg/dL (ref <200)
HDL: 36 mg/dL — ABNORMAL LOW (ref >=40)
LDL Cholesterol (Calc): 86 mg/dL (calc)
Non-HDL Cholesterol (Calc): 116 mg/dL (calc) (ref <130)
Total CHOL/HDL Ratio: 4.2 (calc) (ref <5.0)
Triglycerides: 201 mg/dL — ABNORMAL HIGH (ref <150)

## 2022-07-10 LAB — CBC WITH DIFFERENTIAL/PLATELET
Absolute Monocytes: 506 cells/uL (ref 200–950)
Basophils Absolute: 39 cells/uL (ref 0–200)
Basophils Relative: 0.7 %
Eosinophils Absolute: 39 cells/uL (ref 15–500)
Eosinophils Relative: 0.7 %
HCT: 43.1 % (ref 38.5–50.0)
Hemoglobin: 15.1 g/dL (ref 13.2–17.1)
Lymphs Abs: 1876 cells/uL (ref 850–3900)
MCH: 33 pg (ref 27.0–33.0)
MCHC: 35 g/dL (ref 32.0–36.0)
MCV: 94.3 fL (ref 80.0–100.0)
MPV: 10.6 fL (ref 7.5–12.5)
Monocytes Relative: 9.2 %
Neutro Abs: 3042 cells/uL (ref 1500–7800)
Neutrophils Relative %: 55.3 %
Platelets: 283 10*3/uL (ref 140–400)
RBC: 4.57 10*6/uL (ref 4.20–5.80)
RDW: 13.5 % (ref 11.0–15.0)
Total Lymphocyte: 34.1 %
WBC: 5.5 10*3/uL (ref 3.8–10.8)

## 2022-07-10 LAB — TSH: TSH: 2.33 mIU/L (ref 0.40–4.50)

## 2022-07-10 LAB — HEMOGLOBIN A1C
Hgb A1c MFr Bld: 6.1 % of total Hgb — ABNORMAL HIGH (ref <5.7)
Mean Plasma Glucose: 128 mg/dL
eAG (mmol/L): 7.1 mmol/L

## 2022-07-10 LAB — PSA: PSA: 0.07 ng/mL (ref <=4.00)

## 2022-07-10 LAB — VITAMIN B12: Vitamin B-12: 275 pg/mL (ref 200–1100)

## 2022-07-11 ENCOUNTER — Ambulatory Visit: Payer: Medicaid Other | Admitting: Family

## 2022-07-17 ENCOUNTER — Encounter: Payer: Medicaid Other | Admitting: Urology

## 2022-07-17 NOTE — Progress Notes (Deleted)
Assessment: 1. Prostate cancer ([place])   2. History of urinary retention     Plan: I personally reviewed the patient's chart including provider notes, lab results.   Chief Complaint: No chief complaint on file.   History of Present Illness:  Frank Fischer is a 62 y.o. male who is seen in consultation from Ok Edwards, NP for evaluation of prostate cancer. He has a history of prostate cancer, diagnosed in March 2019.  PSA was 13.4.  Path showed Gleason 4+3 adenocarcinoma.  He is s/p brachytherapy and SpaceOAR in July 2019.   Post treatment PSA:   11/19 2.11 1/23 1.1 2/24 0.07  He has a history of urinary retention requiring SP tube placement in 2023.  His SP tube was removed in 8/23.     Past Medical History:  Past Medical History:  Diagnosis Date   Arthritis    Cervical stenosis of spine    Chronic headaches    Dental caries    periodontal disease   Frequency of urination    History of cerebral infarction 11-28-2017  per pt never had symptoms   per MRI imaging 10-31-2016 2 small chronic lacunar infartions in the right frontal periventricular white matter , and chronic left medial orbital fracture (as seen 2014 MRI)   Hypertension    Noncompliance with medication regimen    Prostate cancer [facility]) urologist-  dr winter/  oncologist-  dr Tammi Klippel   dx 07-21-2017--- Stage T1c, Gleason 4+3,  PSA 13.40,  vol 49.7cc--  scheduled for radioactive seed implants 12-03-2017    Past Surgical History:  Past Surgical History:  Procedure Laterality Date   ABDOMINAL EXPLORATION SURGERY  age 21   per pt had Small Bowel Resection and Appendectomy   APPENDECTOMY     ARM SURGERY Left 1999   REPAIR INJURY-- "ARM CUT ABOUT OFF"   IR CATHETER TUBE CHANGE  10/28/2021   IR GUIDED DRAIN W CATHETER PLACEMENT  09/03/2021   IR US GUIDANCE  09/03/2021   PROSTATE BIOPSY  07/21/2017  dr winter office   RADIOACTIVE SEED IMPLANT N/A 12/03/2017   Procedure: RADIOACTIVE SEED IMPLANT/BRACHYTHERAPY  IMPLANT;  Surgeon: Ceasar Mons, MD;  Location: [facility];  Service: Urology;  Laterality: N/A;  ONLY NEEDS 120 MIN   SPACE OAR INSTILLATION N/A 12/03/2017   Procedure: SPACE OAR INSTILLATION;  Surgeon: Ceasar Mons, MD;  Location: [facility];  Service: Urology;  Laterality: N/A;   TOOTH EXTRACTION N/A 09/21/2018   Procedure: DENTAL EXTRACTIONS WITH ALVEOLIPLASTY;  Surgeon: Diona Browner, DDS;  Location: [place];  Service: Oral Surgery;  Laterality: N/A;    Allergies:  Allergies  Allergen Reactions   Penicillins Itching    Did it involve swelling of the face/tongue/throat, SOB, or low BP? No Did it involve sudden or severe rash/hives, skin peeling, or any reaction on the inside of your mouth or nose? Yes Did you need to seek medical attention at a hospital or doctor's office? No When did it last happen?      09/2018 If all above answers are "NO", may proceed with cephalosporin use.    Family History:  Family History  Problem Relation Age of Onset   Lung cancer Mother    Hypertension Brother    Diabetes Paternal Grandfather    Prostate cancer Cousin     Social History:  Social History   Tobacco Use   Smoking status: Every Day    Packs/day: 0.50    Years: 40.00  Total pack years: 20.00    Types: Cigarettes   Smokeless tobacco: Never  Vaping Use   Vaping Use: Never used  Substance Use Topics   Alcohol use: Yes    Alcohol/week: 21.0 standard drinks of alcohol    Types: 21 Cans of beer per week    Comment: 1-2 12oz  beers every evening   Drug use: Yes    Types: Marijuana    Comment:  per pt uses once week    Review of symptoms:  Constitutional:  Negative for unexplained weight loss, night sweats, fever, chills ENT:  Negative for nose bleeds, sinus pain, painful swallowing CV:  Negative for chest pain, shortness of breath, exercise intolerance, palpitations, loss of consciousness Resp:  Negative for cough, wheezing,  shortness of breath GI:  Negative for nausea, vomiting, diarrhea, bloody stools GU:  Positives noted in HPI; otherwise negative for gross hematuria, dysuria, urinary incontinence Neuro:  Negative for seizures, poor balance, limb weakness, slurred speech Psych:  Negative for lack of energy, depression, anxiety Endocrine:  Negative for polydipsia, polyuria, symptoms of hypoglycemia (dizziness, hunger, sweating) Hematologic:  Negative for anemia, purpura, petechia, prolonged or excessive bleeding, use of anticoagulants  Allergic:  Negative for difficulty breathing or choking as a result of exposure to anything; no shellfish allergy; no allergic response (rash/itch) to materials, foods  Physical exam: There were no vitals taken for this visit. GENERAL APPEARANCE:  Well appearing, well developed, well nourished, NAD HEENT: Atraumatic, Normocephalic, oropharynx clear. NECK: Supple without lymphadenopathy or thyromegaly. LUNGS: Clear to auscultation bilaterally. HEART: Regular Rate and Rhythm without murmurs, gallops, or rubs. ABDOMEN: Soft, non-tender, No Masses. EXTREMITIES: Moves all extremities well.  Without clubbing, cyanosis, or edema. NEUROLOGIC:  Alert and oriented x 3, normal gait, CN II-XII grossly intact.  MENTAL STATUS:  Appropriate. BACK:  Non-tender to palpation.  No CVAT SKIN:  Warm, dry and intact.    Results: No results found for this or any previous visit (from the past 24 hour(s)).

## 2022-07-23 ENCOUNTER — Telehealth: Payer: Self-pay | Admitting: Family

## 2022-07-23 NOTE — Telephone Encounter (Signed)
Personal Care service Attestation of medical Need form completed.Please fax as requested.

## 2022-07-24 NOTE — Telephone Encounter (Signed)
Patient will be picking form up. Form placed in patient pick up cabinet in the administrative area.

## 2022-07-31 ENCOUNTER — Encounter: Payer: Self-pay | Admitting: Urology

## 2022-07-31 ENCOUNTER — Ambulatory Visit (INDEPENDENT_AMBULATORY_CARE_PROVIDER_SITE_OTHER): Payer: Medicaid Other | Admitting: Urology

## 2022-07-31 VITALS — BP 146/80 | HR 65 | Ht 70.0 in | Wt 216.0 lb

## 2022-07-31 DIAGNOSIS — R339 Retention of urine, unspecified: Secondary | ICD-10-CM | POA: Diagnosis not present

## 2022-07-31 DIAGNOSIS — C61 Malignant neoplasm of prostate: Secondary | ICD-10-CM

## 2022-07-31 DIAGNOSIS — R300 Dysuria: Secondary | ICD-10-CM | POA: Diagnosis not present

## 2022-07-31 DIAGNOSIS — Z87898 Personal history of other specified conditions: Secondary | ICD-10-CM

## 2022-07-31 DIAGNOSIS — R8290 Unspecified abnormal findings in urine: Secondary | ICD-10-CM

## 2022-07-31 LAB — URINALYSIS, ROUTINE W REFLEX MICROSCOPIC
Bilirubin, UA: NEGATIVE
Glucose, UA: NEGATIVE mg/dL
Ketones, UA: NEGATIVE
Nitrite, UA: POSITIVE
Protein, UA: POSITIVE — AB
Spec Grav, UA: 1.025 (ref 1.010–1.025)
Urobilinogen, UA: 0.2 E.U./dL
pH, UA: 5.5 (ref 5.0–8.0)

## 2022-07-31 LAB — BLADDER SCAN AMB NON-IMAGING

## 2022-07-31 MED ORDER — TAMSULOSIN HCL 0.4 MG PO CAPS: 0.4000 mg | ORAL_CAPSULE | Freq: Every day | ORAL | 11 refills | Status: DC

## 2022-07-31 NOTE — Progress Notes (Signed)
Assessment: 1. Malignant neoplasm of prostate ([place]): GG 3; s/p LDR brachytherapy 7/19   2. History of urinary retention   3. Abnormal urine findings   4. Dysuria     Plan: I personally reviewed the patient's chart including provider notes, lab results. I reviewed records from [facility] and [facility]. His recent PSA is consistent with an excellent response to brachytherapy for prostate cancer. Urine culture sent today. Will call with results Resume tamsulosin 0.4 mg daily for LUTS. Return to office in August 2024 with PSA   Chief Complaint:  Chief Complaint  Patient presents with   Prostate Cancer    History of Present Illness:  Frank Fischer is a 62 y.o. male who is seen in consultation from Ok Edwards, NP for evaluation of prostate cancer. He has a history of prostate cancer, diagnosed in March 2019.  PSA was 13.4.  Path showed Gleason 4+3 adenocarcinoma.  He is s/p brachytherapy and SpaceOAR in July 2019 by Dr. [name].   Post treatment PSA:   11/19 2.11 1/23 1.1 2/24 0.07  He has a history of urinary retention requiring SP tube placement in 2023.  His SP tube was removed in 8/23.   He reports that he has been voiding spontaneously following SP tube removal.  He does have urinary urgency and dysuria.  He denies frequency or nocturia.  No gross hematuria.  He is not currently taking any medications for his urinary symptoms.   Past Medical History:  Past Medical History:  Diagnosis Date   Arthritis    Cervical stenosis of spine    Chronic headaches    Dental caries    periodontal disease   Frequency of urination    History of cerebral infarction 11-28-2017  per pt never had symptoms   per MRI imaging 10-31-2016 2 small chronic lacunar infartions in the right frontal periventricular white matter , and chronic left medial orbital fracture (as seen 2014 MRI)   Hypertension    Noncompliance with medication regimen    Prostate cancer [facility])  urologist-  dr winter/  oncologist-  dr Tammi Klippel   dx 07-21-2017--- Stage T1c, Gleason 4+3,  PSA 13.40,  vol 49.7cc--  scheduled for radioactive seed implants 12-03-2017    Past Surgical History:  Past Surgical History:  Procedure Laterality Date   ABDOMINAL EXPLORATION SURGERY  age 46   per pt had Small Bowel Resection and Appendectomy   APPENDECTOMY     ARM SURGERY Left 1999   REPAIR INJURY-- "ARM CUT ABOUT OFF"   IR CATHETER TUBE CHANGE  10/28/2021   IR GUIDED DRAIN W CATHETER PLACEMENT  09/03/2021   IR US GUIDANCE  09/03/2021   PROSTATE BIOPSY  07/21/2017  dr winter office   RADIOACTIVE SEED IMPLANT N/A 12/03/2017   Procedure: RADIOACTIVE SEED IMPLANT/BRACHYTHERAPY IMPLANT;  Surgeon: Ceasar Mons, MD;  Location: [facility];  Service: Urology;  Laterality: N/A;  ONLY NEEDS 120 MIN   SPACE OAR INSTILLATION N/A 12/03/2017   Procedure: SPACE OAR INSTILLATION;  Surgeon: Ceasar Mons, MD;  Location: [facility];  Service: Urology;  Laterality: N/A;   TOOTH EXTRACTION N/A 09/21/2018   Procedure: DENTAL EXTRACTIONS WITH ALVEOLIPLASTY;  Surgeon: Diona Browner, DDS;  Location: [facility];  Service: Oral Surgery;  Laterality: N/A;    Allergies:  Allergies  Allergen Reactions   Penicillins Itching    Did it involve swelling of the face/tongue/throat, SOB, or low BP? No Did it involve sudden or  severe rash/hives, skin peeling, or any reaction on the inside of your mouth or nose? Yes Did you need to seek medical attention at a hospital or doctor's office? No When did it last happen?      09/2018 If all above answers are "NO", may proceed with cephalosporin use.    Family History:  Family History  Problem Relation Age of Onset   Lung cancer Mother    Hypertension Brother    Diabetes Paternal Grandfather    Prostate cancer Cousin     Social History:  Social History   Tobacco Use   Smoking status: Every Day    Packs/day: 0.50    Years:  40.00    Total pack years: 20.00    Types: Cigarettes   Smokeless tobacco: Never  Vaping Use   Vaping Use: Never used  Substance Use Topics   Alcohol use: Yes    Alcohol/week: 21.0 standard drinks of alcohol    Types: 21 Cans of beer per week    Comment: 1-2 12oz  beers every evening   Drug use: Yes    Types: Marijuana    Comment:  per pt uses once week    Review of symptoms:  Constitutional:  Negative for unexplained weight loss, night sweats, fever, chills ENT:  Negative for nose bleeds, sinus pain, painful swallowing CV:  Negative for chest pain, shortness of breath, exercise intolerance, palpitations, loss of consciousness Resp:  Negative for cough, wheezing, shortness of breath GI:  Negative for nausea, vomiting, diarrhea, bloody stools GU:  Positives noted in HPI; otherwise negative for gross hematuria, dysuria, urinary incontinence Neuro:  Negative for seizures, poor balance, limb weakness, slurred speech Psych:  Negative for lack of energy, depression, anxiety Endocrine:  Negative for polydipsia, polyuria, symptoms of hypoglycemia (dizziness, hunger, sweating) Hematologic:  Negative for anemia, purpura, petechia, prolonged or excessive bleeding, use of anticoagulants  Allergic:  Negative for difficulty breathing or choking as a result of exposure to anything; no shellfish allergy; no allergic response (rash/itch) to materials, foods  Physical exam: BP (!) 146/80   Pulse 65   Ht '5\' 10"'$  (1.778 m)   Wt 216 lb (98 kg)   BMI 30.99 kg/m  GENERAL APPEARANCE:  Well appearing, well developed, well nourished, NAD HEENT: Atraumatic, Normocephalic, oropharynx clear. NECK: Supple without lymphadenopathy or thyromegaly. LUNGS: Clear to auscultation bilaterally. HEART: Regular Rate and Rhythm without murmurs, gallops, or rubs. ABDOMEN: Soft, non-tender, No Masses. EXTREMITIES: Moves all extremities well.  Without clubbing, cyanosis, or edema. NEUROLOGIC:  Alert and oriented x 3,  normal gait, CN II-XII grossly intact.  MENTAL STATUS:  Appropriate. BACK:  Non-tender to palpation.  No CVAT SKIN:  Warm, dry and intact.   GU: Penis:  uncircumcised Meatus: Normal Scrotum: normal, no masses Testis: normal without masses bilateral Epididymis: normal Prostate: declined by patient Rectum: Deferred  Results: U/A:  1+ blood, 1+ protein, nitrite +. 6-10 WBC, 0-2 RBC, mod bacteria   PVR = 10 ml

## 2022-08-04 LAB — URINE CULTURE

## 2022-08-05 MED ORDER — SULFAMETHOXAZOLE-TRIMETHOPRIM 800-160 MG PO TABS: 1.0000 | ORAL_TABLET | Freq: Two times a day (BID) | ORAL | 0 refills | Status: DC

## 2022-08-05 NOTE — Addendum Note (Signed)
Addended by: Primus Bravo on: 08/05/2022 09:01 AM   Modules accepted: Orders

## 2022-08-06 ENCOUNTER — Encounter: Payer: Self-pay | Admitting: Family

## 2022-08-06 ENCOUNTER — Ambulatory Visit (INDEPENDENT_AMBULATORY_CARE_PROVIDER_SITE_OTHER): Payer: Medicaid Other | Admitting: Family

## 2022-08-06 VITALS — BP 141/88 | HR 68 | Temp 96.8°F | Resp 18 | Ht 70.0 in | Wt 212.4 lb

## 2022-08-06 DIAGNOSIS — J449 Chronic obstructive pulmonary disease, unspecified: Secondary | ICD-10-CM | POA: Diagnosis not present

## 2022-08-06 DIAGNOSIS — Z1211 Encounter for screening for malignant neoplasm of colon: Secondary | ICD-10-CM

## 2022-08-06 DIAGNOSIS — M25561 Pain in right knee: Secondary | ICD-10-CM

## 2022-08-06 DIAGNOSIS — R200 Anesthesia of skin: Secondary | ICD-10-CM | POA: Diagnosis not present

## 2022-08-06 DIAGNOSIS — G8929 Other chronic pain: Secondary | ICD-10-CM

## 2022-08-06 DIAGNOSIS — F17209 Nicotine dependence, unspecified, with unspecified nicotine-induced disorders: Secondary | ICD-10-CM | POA: Diagnosis not present

## 2022-08-06 DIAGNOSIS — Z23 Encounter for immunization: Secondary | ICD-10-CM

## 2022-08-06 DIAGNOSIS — I10 Essential (primary) hypertension: Secondary | ICD-10-CM

## 2022-08-06 MED ORDER — BUPROPION HCL ER (SR) 150 MG PO TB12: 150.0000 mg | ORAL_TABLET | Freq: Two times a day (BID) | ORAL | 3 refills | Status: DC

## 2022-08-06 MED ORDER — AMLODIPINE BESYLATE 10 MG PO TABS: 10.0000 mg | ORAL_TABLET | Freq: Every day | ORAL | 3 refills | Status: DC

## 2022-08-06 MED ORDER — ALBUTEROL SULFATE HFA 108 (90 BASE) MCG/ACT IN AERS: 2.0000 | INHALATION_SPRAY | Freq: Four times a day (QID) | RESPIRATORY_TRACT | 3 refills | Status: DC | PRN

## 2022-08-06 NOTE — Progress Notes (Signed)
Provider: Marlowe Sax FNP-C  Frank Fischer, Nelda Bucks, NP  Patient Care Team: Nychelle Cassata, Nelda Bucks, NP as PCP - General (Family Medicine) Melvenia Beam, MD as Consulting Physician (Neurology) Ceasar Mons, MD as Consulting Physician (Urology) Tyler Pita, MD as Consulting Physician (Radiation Oncology)  Extended Emergency Contact Information Primary Emergency Contact: Alessandra Grout, [place] [address] [address] of [place] Phone: [phone] Relation: Brother  Code Status:  Full Code  Goals of care: Advanced Directive information    07/09/2022    1:13 PM  Advanced Directives  Does Patient Have a Medical Advance Directive? No  Would patient like information on creating a medical advance directive? No - Patient declined     Chief Complaint  Patient presents with   Follow-up    4 weeks follow up   Quality Metric Gaps    Needs to Discuss colonoscopy, Hepatitis C Screening, Shingrix Vaccine, Lung Cancer Screening , COVID 19 Vaccine, TDAP vaccine, & Flu Shot.      HPI:  Pt is a 62 y.o. male seen today for an acute visit for evaluation  of high blood pressure.He was seen by Ok Edwards ,NP on 07/09/2022 B/p was 160/100 amlodipine 5 mg tablet daily was added to lisinopril 40 mg tablet.States has been taking medication as prescribed.He does not check his B/p at home but was seen at the urologist and BP was 146/80. States continues to have intermittent chest pain sometimes.He does smoke 1/2 pack of cigarette per day.smoking cessation.cough has not worsen.uses albuterol for COPD. He denies any dizziness,vision changes,fatigue,chest tightness,palpitation or shortness of breath.   Also complains of chronic neck pain.but more concern about bilateral fingers numbness and tingling.states middle,ring and pinky  fingers on both hands are numb. States thumb and index fingers are okay.   Right knee pain - chronic but has worsen.would like to see  Orthopedic.  Past Medical History:  Diagnosis Date   Arthritis    Cervical stenosis of spine    Chronic headaches    Dental caries    periodontal disease   Frequency of urination    History of cerebral infarction 11-28-2017  per pt never had symptoms   per MRI imaging 10-31-2016 2 small chronic lacunar infartions in the right frontal periventricular white matter , and chronic left medial orbital fracture (as seen 2014 MRI)   Hypertension    Noncompliance with medication regimen    Prostate cancer [facility]) urologist-  dr winter/  oncologist-  dr Tammi Klippel   dx 07-21-2017--- Stage T1c, Gleason 4+3,  PSA 13.40,  vol 49.7cc--  scheduled for radioactive seed implants 12-03-2017   Past Surgical History:  Procedure Laterality Date   ABDOMINAL EXPLORATION SURGERY  age 22   per pt had Small Bowel Resection and Appendectomy   APPENDECTOMY     ARM SURGERY Left 1999   REPAIR INJURY-- "ARM CUT ABOUT OFF"   IR CATHETER TUBE CHANGE  10/28/2021   IR GUIDED DRAIN W CATHETER PLACEMENT  09/03/2021   IR US GUIDANCE  09/03/2021   PROSTATE BIOPSY  07/21/2017  dr winter office   RADIOACTIVE SEED IMPLANT N/A 12/03/2017   Procedure: RADIOACTIVE SEED IMPLANT/BRACHYTHERAPY IMPLANT;  Surgeon: Ceasar Mons, MD;  Location: [facility];  Service: Urology;  Laterality: N/A;  ONLY NEEDS 120 MIN   SPACE OAR INSTILLATION N/A 12/03/2017   Procedure: SPACE OAR INSTILLATION;  Surgeon: Ceasar Mons, MD;  Location: [facility];  Service:  Urology;  Laterality: N/A;   TOOTH EXTRACTION N/A 09/21/2018   Procedure: DENTAL EXTRACTIONS WITH ALVEOLIPLASTY;  Surgeon: Diona Browner, DDS;  Location: [place];  Service: Oral Surgery;  Laterality: N/A;    Allergies  Allergen Reactions   Penicillins Itching    Did it involve swelling of the face/tongue/throat, SOB, or low BP? No Did it involve sudden or severe rash/hives, skin peeling, or any reaction on the inside of your mouth or nose?  Yes Did you need to seek medical attention at a hospital or doctor's office? No When did it last happen?      09/2018 If all above answers are "NO", may proceed with cephalosporin use.    Outpatient Encounter Medications as of 08/06/2022  Medication Sig   albuterol (VENTOLIN HFA) 108 (90 Base) MCG/ACT inhaler Inhale 2 puffs into the lungs every 6 (six) hours as needed for wheezing or shortness of breath.   amLODipine (NORVASC) 5 MG tablet Take 1 tablet (5 mg total) by mouth daily.   ASPIRIN 81 PO Take 81 mg by mouth.   atorvastatin (LIPITOR) 80 MG tablet Take 1 tablet (80 mg total) by mouth daily at 6 PM.   lisinopril (ZESTRIL) 40 MG tablet Take 1 tablet (40 mg total) by mouth every morning.   sulfamethoxazole-trimethoprim (BACTRIM DS) 800-160 MG tablet Take 1 tablet by mouth every 12 (twelve) hours.   tamsulosin (FLOMAX) 0.4 MG CAPS capsule Take 1 capsule (0.4 mg total) by mouth daily.   No facility-administered encounter medications on file as of 08/06/2022.    Review of Systems  Constitutional:  Negative for appetite change, chills, fatigue, fever and unexpected weight change.  HENT:  Negative for congestion, dental problem, ear discharge, ear pain, facial swelling, hearing loss, nosebleeds, postnasal drip, rhinorrhea, sinus pressure, sinus pain, sneezing, sore throat, tinnitus and trouble swallowing.   Eyes:  Negative for pain, discharge, redness, itching and visual disturbance.  Respiratory:  Negative for cough, chest tightness, shortness of breath and wheezing.   Cardiovascular:  Negative for chest pain, palpitations and leg swelling.  Gastrointestinal:  Negative for abdominal distention, abdominal pain, blood in stool, constipation, diarrhea, nausea and vomiting.  Endocrine: Negative for cold intolerance, heat intolerance, polydipsia, polyphagia and polyuria.  Genitourinary:  Negative for difficulty urinating, dysuria, flank pain, frequency and urgency.  Musculoskeletal:  Positive for  arthralgias. Negative for back pain, gait problem, joint swelling, myalgias, neck pain and neck stiffness.       Right knee pain   Skin:  Negative for color change, pallor, rash and wound.  Neurological:  Negative for dizziness, syncope, speech difficulty, weakness, light-headedness, numbness and headaches.  Hematological:  Does not bruise/bleed easily.  Psychiatric/Behavioral:  Negative for agitation, behavioral problems, confusion, hallucinations, self-injury, sleep disturbance and suicidal ideas. The patient is not nervous/anxious.      There is no immunization history on file for this patient. Pertinent  Health Maintenance Due  Topic Date Due   COLONOSCOPY (Pts 45-36yrs Insurance coverage will need to be confirmed)  Never done   INFLUENZA VACCINE  08/18/2022 (Originally 12/18/2021)      06/15/2019    1:42 PM 09/02/2021    8:33 PM 09/03/2021    7:32 PM 10/28/2021    1:16 AM 08/06/2022    9:26 AM  Fall Risk  Falls in the past year?     0  Was there an injury with Fall?     0  Fall Risk Category Calculator     0  (RETIRED) Patient  Fall Risk Level Low fall risk Low fall risk Low fall risk Low fall risk   Patient at Risk for Falls Due to     No Fall Risks  Fall risk Follow up     Falls evaluation completed   Functional Status Survey:    Vitals:   08/06/22 0922  BP: (!) 141/88  Pulse: 68  Resp: 18  Temp: (!) 96.8 F (36 C)  SpO2: 97%  Weight: 212 lb 6 oz (96.3 kg)  Height: 5\' 10"  (1.778 m)   Body mass index is 30.47 kg/m. Physical Exam Vitals reviewed.  Constitutional:      General: He is not in acute distress.    Appearance: Normal appearance. He is obese. He is not ill-appearing or diaphoretic.  HENT:     Head: Normocephalic.     Right Ear: Tympanic membrane, ear canal and external ear normal. There is no impacted cerumen.     Left Ear: Tympanic membrane, ear canal and external ear normal. There is no impacted cerumen.     Nose: Nose normal. No congestion or  rhinorrhea.     Mouth/Throat:     Mouth: Mucous membranes are moist.     Pharynx: Oropharynx is clear. No oropharyngeal exudate or posterior oropharyngeal erythema.  Eyes:     General: No scleral icterus.       Right eye: No discharge.        Left eye: No discharge.     Extraocular Movements: Extraocular movements intact.     Conjunctiva/sclera: Conjunctivae normal.     Pupils: Pupils are equal, round, and reactive to light.  Neck:     Vascular: No carotid bruit.  Cardiovascular:     Rate and Rhythm: Normal rate and regular rhythm.     Pulses: Normal pulses.     Heart sounds: Normal heart sounds. No murmur heard.    No friction rub. No gallop.  Pulmonary:     Effort: Pulmonary effort is normal. No respiratory distress.     Breath sounds: Normal breath sounds. No wheezing, rhonchi or rales.  Chest:     Chest wall: No tenderness.  Abdominal:     General: Bowel sounds are normal. There is no distension.     Palpations: Abdomen is soft. There is no mass.     Tenderness: There is no abdominal tenderness. There is no right CVA tenderness, left CVA tenderness, guarding or rebound.  Musculoskeletal:        General: No swelling. Normal range of motion.     Cervical back: Normal range of motion. No rigidity or tenderness.     Right knee: Swelling present. No deformity, effusion, erythema or ecchymosis. Normal range of motion. Tenderness present.     Right lower leg: No edema.     Left lower leg: No edema.  Lymphadenopathy:     Cervical: No cervical adenopathy.  Skin:    General: Skin is warm and dry.     Coloration: Skin is not pale.     Findings: No bruising, erythema, lesion or rash.  Neurological:     Mental Status: He is alert and oriented to person, place, and time.     Cranial Nerves: No cranial nerve deficit.     Sensory: No sensory deficit.     Motor: No weakness.     Coordination: Coordination normal.     Gait: Gait normal.  Psychiatric:        Mood and Affect: Mood  normal.  Speech: Speech normal.        Behavior: Behavior normal.        Thought Content: Thought content normal.        Judgment: Judgment normal.     Labs reviewed: Recent Labs    09/02/21 2042 09/03/21 2016 07/09/22 1349  NA 139 141 142  K 3.9 4.1 4.1  CL 109 111 107  CO2 22 21* 26  GLUCOSE 99 123* 101*  BUN 11 13 14   CREATININE 1.02 1.14 1.32  CALCIUM 9.3 9.6 9.5   Recent Labs    07/09/22 1349  AST 21  ALT 18  BILITOT 0.3  PROT 6.8   Recent Labs    09/02/21 2042 09/03/21 2016 07/09/22 1349  WBC 6.4 8.6 5.5  NEUTROABS 3.5 6.1 3,042  HGB 15.7 16.2 15.1  HCT 45.3 46.4 43.1  MCV 95.8 95.3 94.3  PLT 280 291 283   Lab Results  Component Value Date   TSH 2.33 07/09/2022   Lab Results  Component Value Date   HGBA1C 6.1 (H) 07/09/2022   Lab Results  Component Value Date   CHOL 152 07/09/2022   HDL 36 (L) 07/09/2022   LDLCALC 86 07/09/2022   TRIG 201 (H) 07/09/2022   CHOLHDL 4.2 07/09/2022    Significant Diagnostic Results in last 30 days:  No results found.  Assessment/Plan 1. Essential hypertension, benign B/p not at goal - refill amlodipine  - amLODipine (NORVASC) 10 MG tablet; Take 1 tablet (10 mg total) by mouth daily.  Dispense: 30 tablet; Refill: 3  2. Chronic obstructive pulmonary disease, unspecified COPD type ([place]) Breathing stable  - continue on albuterol  - albuterol (VENTOLIN HFA) 108 (90 Base) MCG/ACT inhaler; Inhale 2 puffs into the lungs every 6 (six) hours as needed for wheezing or shortness of breath.  Dispense: 1 each; Refill: 3  3. Tobacco use disorder, continuous Start on Bupropion  - buPROPion (WELLBUTRIN SR) 150 MG 12 hr tablet; Take 1 tablet (150 mg total) by mouth 2 (two) times daily.  Dispense: 60 tablet; Refill: 3 - CT CHEST LUNG CA SCREEN LOW DOSE W/O CM; Future  4. Numbness of fingers of both hands Worsening symptoms. Will send to Neuro for evaluation. - Ambulatory referral to Neurology  5. Colon cancer  screening Asymptomatic  - Ambulatory referral to Gastroenterology  6. Chronic pain of right knee Tender no swelling or erythema.  - Ambulatory referral to Orthopedic Surgery  Family/ staff Communication: Reviewed plan of care with patient verbalized understanding   Labs/tests ordered:  - CT CHEST LUNG CA SCREEN LOW DOSE W/O CM; Future  Next Appointment:   Sandrea Hughs, NP

## 2022-08-16 NOTE — Addendum Note (Signed)
Addended byMarlowe Sax C on: 08/16/2022 03:01 PM   Modules accepted: Level of Service

## 2022-08-21 ENCOUNTER — Ambulatory Visit: Payer: Medicaid Other | Admitting: Orthopaedic Surgery

## 2022-08-23 ENCOUNTER — Telehealth: Payer: Self-pay

## 2022-08-23 DIAGNOSIS — R2681 Unsteadiness on feet: Secondary | ICD-10-CM

## 2022-08-23 NOTE — Telephone Encounter (Signed)
Order for shower chair faxed to [employer]

## 2022-08-23 NOTE — Telephone Encounter (Signed)
Shatara with [facility] called requesting order for shower chair for patient. She did home visit on patient and is requesting shower chair for unsteady gait. Order san be sent to any company.  Message routed to Richarda Blade, NP

## 2022-08-29 ENCOUNTER — Ambulatory Visit: Payer: Medicaid Other | Admitting: Orthopaedic Surgery

## 2022-08-30 ENCOUNTER — Encounter: Payer: Self-pay | Admitting: Neurology

## 2022-09-04 ENCOUNTER — Ambulatory Visit: Admission: RE | Admit: 2022-09-04 | Payer: Medicaid Other | Source: Ambulatory Visit

## 2022-09-04 ENCOUNTER — Ambulatory Visit (INDEPENDENT_AMBULATORY_CARE_PROVIDER_SITE_OTHER): Payer: Medicaid Other | Admitting: Family

## 2022-09-04 ENCOUNTER — Encounter: Payer: Self-pay | Admitting: Family

## 2022-09-04 VITALS — BP 134/72 | HR 80 | Temp 97.4°F | Resp 95 | Ht 70.0 in | Wt 216.0 lb

## 2022-09-04 DIAGNOSIS — I10 Essential (primary) hypertension: Secondary | ICD-10-CM | POA: Diagnosis not present

## 2022-09-04 DIAGNOSIS — F17209 Nicotine dependence, unspecified, with unspecified nicotine-induced disorders: Secondary | ICD-10-CM

## 2022-09-04 DIAGNOSIS — R200 Anesthesia of skin: Secondary | ICD-10-CM

## 2022-09-04 DIAGNOSIS — Z1211 Encounter for screening for malignant neoplasm of colon: Secondary | ICD-10-CM | POA: Diagnosis not present

## 2022-09-04 DIAGNOSIS — F1910 Other psychoactive substance abuse, uncomplicated: Secondary | ICD-10-CM

## 2022-09-04 DIAGNOSIS — C61 Malignant neoplasm of prostate: Secondary | ICD-10-CM

## 2022-09-04 DIAGNOSIS — J449 Chronic obstructive pulmonary disease, unspecified: Secondary | ICD-10-CM

## 2022-09-04 DIAGNOSIS — G44229 Chronic tension-type headache, not intractable: Secondary | ICD-10-CM

## 2022-09-04 DIAGNOSIS — M5010 Cervical disc disorder with radiculopathy, unspecified cervical region: Secondary | ICD-10-CM

## 2022-09-04 MED ORDER — TAMSULOSIN HCL 0.4 MG PO CAPS: 0.4000 mg | ORAL_CAPSULE | Freq: Every day | ORAL | 11 refills | Status: DC

## 2022-09-04 NOTE — Assessment & Plan Note (Signed)
Continues to smoke marijuana daily

## 2022-09-04 NOTE — Progress Notes (Signed)
Provider: Richarda Blade FNP-C  Frank Fischer, Frank Citrin, NP  Patient Care Team: Tiajah Oyster, Frank Citrin, NP as PCP - General (Family Medicine) Anson Fret, MD as Consulting Physician (Neurology) Rene Paci, MD as Consulting Physician (Urology) Margaretmary Dys, MD as Consulting Physician (Radiation Oncology)  Extended Emergency Contact Information Primary Emergency Contact: Terrill Mohr, [place] [address] [place] of [place] Home Phone: [phone] Relation: Brother  Code Status:  Full Code  Goals of care: Advanced Directive information    07/09/2022    1:13 PM  Advanced Directives  Does Patient Have a Medical Advance Directive? No  Would patient like information on creating a medical advance directive? No - Patient declined     Chief Complaint  Patient presents with   smoking cessation    1 month fu. Started bupropion. He has not started medication that he was given. He states he doesn't smoke as much down to 4-5 a day.     HPI:  Pt is a 62 y.o. male seen today for an acute visit for follow up cigarette smoking.He was here 08/06/2022 was started on bupropion though states has not started taking Bupropion but  doesn't smoke as much.Has cut down to 4-5 a day previously 15 cigarette per day.  Sometimes has a dry cough. States picked Bupropion yesterday will be starting on medication.He did not pick up medication one month ago due to lack of money had to pay his bills first.States his previous insurance did not have any Copay for medication but now has to pay.  He complain of bilateral hands pain,numbness and tingling for a long time.Also tender on the left elbow.Has used topical analgesic from previous provider in [place] but states does not help.  States smokes marijuana every day to help with headache and neck pain.states prior to having stroke he was supposed to see pain management specialist but missed the appointment.He request referral.    Follows up with urologist Dr. Di Kindle prostate cancer and Urine retention last seen 07/31/2022.request refill on Flomax. Has completed Bactrium DS prescribed by urologist  for Klebsiella pneumoniae.He denies any fever,chills,nausea,vomiting,abdominal pain,flank pain,urgency,frequency,dysuria,difficult urination or hematuria.    Past Medical History:  Diagnosis Date   Arthritis    Cervical stenosis of spine    Chronic headaches    Dental caries    periodontal disease   Frequency of urination    History of cerebral infarction 11-28-2017  per pt never had symptoms   per MRI imaging 10-31-2016 2 small chronic lacunar infartions in the right frontal periventricular white matter , and chronic left medial orbital fracture (as seen 2014 MRI)   Hypertension    Noncompliance with medication regimen    Prostate cancer urologist-  dr winter/  oncologist-  dr Kathrynn Running   dx 07-21-2017--- Stage T1c, Gleason 4+3,  PSA 13.40,  vol 49.7cc--  scheduled for radioactive seed implants 12-03-2017   Past Surgical History:  Procedure Laterality Date   ABDOMINAL EXPLORATION SURGERY  age 72   per pt had Small Bowel Resection and Appendectomy   APPENDECTOMY     ARM SURGERY Left 1999   REPAIR INJURY-- "ARM CUT ABOUT OFF"   IR CATHETER TUBE CHANGE  10/28/2021   IR GUIDED DRAIN W CATHETER PLACEMENT  09/03/2021   IR US GUIDANCE  09/03/2021   PROSTATE BIOPSY  07/21/2017  dr winter office   RADIOACTIVE SEED IMPLANT N/A 12/03/2017   Procedure: RADIOACTIVE SEED IMPLANT/BRACHYTHERAPY IMPLANT;  Surgeon: Liliane Shi,  Dorian Furnace, MD;  Location: [facility];  Service: Urology;  Laterality: N/A;  ONLY NEEDS 120 MIN   SPACE OAR INSTILLATION N/A 12/03/2017   Procedure: SPACE OAR INSTILLATION;  Surgeon: Rene Paci, MD;  Location: [facility];  Service: Urology;  Laterality: N/A;   TOOTH EXTRACTION N/A 09/21/2018   Procedure: DENTAL EXTRACTIONS WITH ALVEOLIPLASTY;  Surgeon:  Ocie Doyne, DDS;  Location: [facility];  Service: Oral Surgery;  Laterality: N/A;    Allergies  Allergen Reactions   Penicillins Itching    Did it involve swelling of the face/tongue/throat, SOB, or low BP? No Did it involve sudden or severe rash/hives, skin peeling, or any reaction on the inside of your mouth or nose? Yes Did you need to seek medical attention at a hospital or doctor's office? No When did it last happen?      09/2018 If all above answers are "NO", may proceed with cephalosporin use.    Outpatient Encounter Medications as of 09/04/2022  Medication Sig   albuterol (VENTOLIN HFA) 108 (90 Base) MCG/ACT inhaler Inhale 2 puffs into the lungs every 6 (six) hours as needed for wheezing or shortness of breath.   amLODipine (NORVASC) 10 MG tablet Take 1 tablet (10 mg total) by mouth daily.   ASPIRIN 81 PO Take 81 mg by mouth.   atorvastatin (LIPITOR) 80 MG tablet Take 1 tablet (80 mg total) by mouth daily at 6 PM.   buPROPion (WELLBUTRIN SR) 150 MG 12 hr tablet Take 1 tablet (150 mg total) by mouth 2 (two) times daily.   lisinopril (ZESTRIL) 40 MG tablet Take 1 tablet (40 mg total) by mouth every morning.   sulfamethoxazole-trimethoprim (BACTRIM DS) 800-160 MG tablet Take 1 tablet by mouth every 12 (twelve) hours.   tamsulosin (FLOMAX) 0.4 MG CAPS capsule Take 1 capsule (0.4 mg total) by mouth daily.   No facility-administered encounter medications on file as of 09/04/2022.    Review of Systems  Constitutional:  Negative for appetite change, chills, fatigue, fever and unexpected weight change.  HENT:  Negative for congestion, dental problem, ear discharge, ear pain, facial swelling, hearing loss, nosebleeds, postnasal drip, rhinorrhea, sinus pressure, sinus pain, sneezing, sore throat, tinnitus and trouble swallowing.   Eyes:  Negative for pain, discharge, redness, itching and visual disturbance.  Respiratory:  Negative for chest tightness, shortness of breath and wheezing.         Chronic intermittent cough   Cardiovascular:  Negative for chest pain, palpitations and leg swelling.  Gastrointestinal:  Negative for abdominal distention, abdominal pain, blood in stool, constipation, diarrhea, nausea and vomiting.  Endocrine: Negative for cold intolerance, heat intolerance, polydipsia, polyphagia and polyuria.  Genitourinary:  Negative for difficulty urinating, dysuria, flank pain, frequency and urgency.  Musculoskeletal:  Positive for arthralgias and neck pain. Negative for back pain, gait problem, joint swelling, myalgias and neck stiffness.  Skin:  Negative for color change, pallor, rash and wound.  Neurological:  Positive for numbness. Negative for dizziness, syncope, speech difficulty, weakness, light-headedness and headaches.       Both hands    Immunization History  Administered Date(s) Administered   Tdap 07/30/2022   Pertinent  Health Maintenance Due  Topic Date Due   COLONOSCOPY (Pts 45-72yrs Insurance coverage will need to be confirmed)  Never done   INFLUENZA VACCINE  12/19/2022      06/15/2019    1:42 PM 09/02/2021    8:33 PM 09/03/2021    7:32 PM 10/28/2021  1:16 AM 08/06/2022    9:26 AM  Fall Risk  Falls in the past year?     0  Was there an injury with Fall?     0  Fall Risk Category Calculator     0  (RETIRED) Patient Fall Risk Level Low fall risk Low fall risk Low fall risk Low fall risk   Patient at Risk for Falls Due to     No Fall Risks  Fall risk Follow up     Falls evaluation completed   Functional Status Survey:    Vitals:   09/04/22 0907  BP: 134/72  Pulse: 80  Resp: (!) 95  Temp: (!) 97.4 F (36.3 C)  TempSrc: Temporal  Weight: 216 lb (98 kg)  Height: 5\' 10"  (1.778 m)   Body mass index is 30.99 kg/m. Physical Exam Vitals reviewed.  Constitutional:      General: He is not in acute distress.    Appearance: Normal appearance. He is normal weight. He is not ill-appearing or diaphoretic.  HENT:     Head: Normocephalic.   Neck:     Vascular: No carotid bruit.  Cardiovascular:     Rate and Rhythm: Normal rate and regular rhythm.     Pulses: Normal pulses.     Heart sounds: Normal heart sounds. No murmur heard.    No friction rub. No gallop.  Pulmonary:     Effort: Pulmonary effort is normal. No respiratory distress.     Breath sounds: Normal breath sounds. No wheezing, rhonchi or rales.  Chest:     Chest wall: No tenderness.  Abdominal:     General: Bowel sounds are normal. There is no distension.     Palpations: Abdomen is soft. There is no mass.     Tenderness: There is no abdominal tenderness. There is no right CVA tenderness, left CVA tenderness, guarding or rebound.  Musculoskeletal:        General: No swelling or tenderness. Normal range of motion.     Cervical back: Normal range of motion. No rigidity or tenderness.     Right lower leg: No edema.     Left lower leg: No edema.  Lymphadenopathy:     Cervical: No cervical adenopathy.  Skin:    General: Skin is warm and dry.     Coloration: Skin is not pale.     Findings: No bruising, erythema, lesion or rash.  Neurological:     Mental Status: He is alert and oriented to person, place, and time.     Cranial Nerves: No cranial nerve deficit.     Sensory: No sensory deficit.     Motor: No weakness.     Coordination: Coordination normal.     Gait: Gait normal.  Psychiatric:        Mood and Affect: Mood normal.        Speech: Speech normal.        Behavior: Behavior normal.    Labs reviewed: Recent Labs    07/09/22 1349  NA 142  K 4.1  CL 107  CO2 26  GLUCOSE 101*  BUN 14  CREATININE 1.32  CALCIUM 9.5   Recent Labs    07/09/22 1349  AST 21  ALT 18  BILITOT 0.3  PROT 6.8   Recent Labs    07/09/22 1349  WBC 5.5  NEUTROABS 3,042  HGB 15.1  HCT 43.1  MCV 94.3  PLT 283   Lab Results  Component Value Date  TSH 2.33 07/09/2022   Lab Results  Component Value Date   HGBA1C 6.1 (H) 07/09/2022   Lab Results   Component Value Date   CHOL 152 07/09/2022   HDL 36 (L) 07/09/2022   LDLCALC 86 07/09/2022   TRIG 201 (H) 07/09/2022   CHOLHDL 4.2 07/09/2022    Significant Diagnostic Results in last 30 days:  No results found.  Assessment/Plan 1. Essential hypertension, benign Blood pressure well-controlled -Continue on lisinopril and amlodipine -Requires assistance with medication - Ambulatory referral to Social Work  2. Colon cancer screening Symptomatic Cologuard versus colonoscopy discussed patient prefers cologuard.  - Cologuard  3. Tobacco use disorder, continuous Has cut down to 4 -5 cigarettes/day encouraged to continue to cut down on smoking.  4. Numbness of fingers of both hands Chronic -Refer to neurology for further evaluation - Ambulatory referral to Neurology  5. Polysubstance abuse No recent use of cocaine or marijuana  6. Chronic tension-type headache, not intractable Pain management clinic for further evaluation - Ambulatory referral to Pain Clinic  7. Cervical disc disorder with radiculopathy of cervical region Chronic - Ambulatory referral to Pain Clinic  8. Chronic obstructive pulmonary disease, unspecified COPD type Breathing stable Requires assistance with medication.  Previous insurance used to cover for medication but now requires co-pay Ambulatory referral to Social Work  9. Malignant neoplasm of prostate Continue to follow-up with urologist - Ambulatory referral to Social Work  Family/ staff Communication: Reviewed plan of care with patient verbalized understanding  Labs/tests ordered:  - Cologuard  Next Appointment: Return in about 6 months (around 03/06/2023) for medical mangement of chronic issues., Fasting labs in 6 months prior to visit.   Caesar Bookman, NP

## 2022-09-08 NOTE — Progress Notes (Deleted)
Office Visit Note   Patient: Frank Fischer           Date of Birth: [birth year]           MRN: [id number] Visit Date: 09/10/2022              Requested by: Caesar Bookman, NP [address] [place],  [place] [address] PCP: Caesar Bookman, NP   Assessment & Plan: Visit Diagnoses:  1. Chronic pain of right knee     Plan: ***  Follow-Up Instructions: No follow-ups on file.   Orders:  No orders of the defined types were placed in this encounter.  No orders of the defined types were placed in this encounter.     Procedures: No procedures performed   Clinical Data: No additional findings.   Subjective: No chief complaint on file.   HPI  Review of Systems  Constitutional: Negative.   HENT: Negative.    Eyes: Negative.   Respiratory: Negative.    Cardiovascular: Negative.   Gastrointestinal: Negative.   Endocrine: Negative.   Genitourinary: Negative.   Skin: Negative.   Allergic/Immunologic: Negative.   Neurological: Negative.   Hematological: Negative.   Psychiatric/Behavioral: Negative.    All other systems reviewed and are negative.   Objective: Vital Signs: There were no vitals taken for this visit.  Physical Exam Vitals and nursing note reviewed.  Constitutional:      Appearance: He is well-developed.  HENT:     Head: Normocephalic and atraumatic.  Eyes:     Pupils: Pupils are equal, round, and reactive to light.  Pulmonary:     Effort: Pulmonary effort is normal.  Abdominal:     Palpations: Abdomen is soft.  Musculoskeletal:        General: Normal range of motion.     Cervical back: Neck supple.  Skin:    General: Skin is warm.  Neurological:     Mental Status: He is alert and oriented to person, place, and time.  Psychiatric:        Behavior: Behavior normal.        Thought Content: Thought content normal.        Judgment: Judgment normal.   Ortho Exam  Specialty Comments:  No specialty comments available.  Imaging: No  results found.   PMFS History: Patient Active Problem List   Diagnosis Date Noted  . History of urinary retention 07/31/2022  . Acute CVA (cerebrovascular accident) 02/26/2019  . Polysubstance abuse 02/26/2019  . Malignant neoplasm of prostate 10/02/2017  . Noncompliance with medication regimen 12/18/2016  . Chronic tension type headache 10/20/2016  . Headache(784.0) 10/19/2013  . Itching 10/19/2013  . Smoker 10/19/2013  . Essential hypertension, benign 07/08/2013  . Pain in joint, shoulder region 07/08/2013  . Cervical disc disorder with radiculopathy of cervical region 07/08/2013  . Dental caries 07/08/2013  . Spinal stenosis in cervical region 07/07/2013  . Tendinitis of left rotator cuff 07/07/2013   Past Medical History:  Diagnosis Date  . Arthritis   . Cervical stenosis of spine   . Chronic headaches   . Dental caries    periodontal disease  . Frequency of urination   . History of cerebral infarction 11-28-2017  per pt never had symptoms   per MRI imaging 10-31-2016 2 small chronic lacunar infartions in the right frontal periventricular white matter , and chronic left medial orbital fracture (as seen 2014 MRI)  . Hypertension   . Noncompliance with medication regimen   .  Prostate cancer urologist-  dr winter/  oncologist-  dr Kathrynn Running   dx 07-21-2017--- Stage T1c, Gleason 4+3,  PSA 13.40,  vol 49.7cc--  scheduled for radioactive seed implants 12-03-2017    Family History  Problem Relation Age of Onset  . Lung cancer Mother   . Hypertension Brother   . Diabetes Paternal Grandfather   . Prostate cancer Cousin     Past Surgical History:  Procedure Laterality Date  . ABDOMINAL EXPLORATION SURGERY  age 62   per pt had Small Bowel Resection and Appendectomy  . APPENDECTOMY    . ARM SURGERY Left 1999   REPAIR INJURY-- "ARM CUT ABOUT OFF"  . IR CATHETER TUBE CHANGE  10/28/2021  . IR GUIDED DRAIN W CATHETER PLACEMENT  09/03/2021  . IR US GUIDANCE  09/03/2021  . PROSTATE  BIOPSY  07/21/2017  dr winter office  . RADIOACTIVE SEED IMPLANT N/A 12/03/2017   Procedure: RADIOACTIVE SEED IMPLANT/BRACHYTHERAPY IMPLANT;  Surgeon: Rene Paci, MD;  Location: [facility];  Service: Urology;  Laterality: N/A;  ONLY NEEDS 120 MIN  . SPACE OAR INSTILLATION N/A 12/03/2017   Procedure: SPACE OAR INSTILLATION;  Surgeon: Rene Paci, MD;  Location[facility];  Service: Urology;  Laterality: N/A;  . TOOTH EXTRACTION N/A 09/21/2018   Procedure: DENTAL EXTRACTIONS WITH ALVEOLIPLASTY;  Surgeon: Ocie Doyne, DDS;  Location: [facility];  Service: Oral Surgery;  Laterality: N/A;   Social History   Occupational History  . Occupation: N/A  Tobacco Use  . Smoking status: Every Day    Packs/day: 0.25    Years: 40.00    Additional pack years: 0.00    Total pack years: 10.00    Types: Cigarettes  . Smokeless tobacco: Never  Vaping Use  . Vaping Use: Never used  Substance and Sexual Activity  . Alcohol use: Yes    Alcohol/week: 21.0 standard drinks of alcohol    Types: 21 Cans of beer per week    Comment: 1-2 12oz  beers every evening  . Drug use: Yes    Types: Marijuana    Comment:  per pt uses once week  . Sexual activity: Yes

## 2022-09-10 ENCOUNTER — Ambulatory Visit: Payer: Medicaid Other | Admitting: Orthopaedic Surgery

## 2022-09-10 DIAGNOSIS — G8929 Other chronic pain: Secondary | ICD-10-CM

## 2022-09-10 NOTE — Progress Notes (Unsigned)
Initial neurology clinic note  SERVICE DATE: 09/12/22  Reason for Evaluation: Consultation requested by Ngetich, Dinah C, NP for an opinion regarding numbness, tingling, and pain of bilateral hands. My final recommendations will be communicated back to the requesting physician by way of shared medical record or letter to requesting physician via [place] mail.  HPI: This is Frank. Frank Fischer, a 62 y.o. right-handed male with a medical history of stroke (2020, right hemisphere), HTN, HLD, COPD, prostate cancer, OA, chronic headaches, and cervical radiculopathy who presents to neurology clinic with the chief complaint of pain in bilateral hands. The patient is accompanied by friend`.  Patient has had symptoms for a few years. He cut his left arm around 2000. The arm has been numb in the anterior left forearm where he was cut since then. He describes achiness that he says is like squeezing with numbness and tingling. He thinks the left is worse than the right. He sometimes has difficulty picking things up. His symptoms are constant and not worse at night. The symptoms do not wake him up at night. He has had 2 surgeries on his left forearm previous due to ligament damage per patient.   He has a history of cervical radiculopathy and has pain that comes and goes. He gets cramping of the neck. He denies radiating pain. He has had nerve blocks in the past, but this did not change the symptoms in his arm.  He denies any numbness, tingling, or weakness in the legs.  He does not report any constitutional symptoms like fever, night sweats, anorexia or unintentional weight loss.  EtOH use: 1-2 beers per day  Restrictive diet? No Family history of neuropathy/myopathy/NM disease? Brother with back and neck spine surgeries  He has never had an EMG. He is not currently on any medications for his symptoms.   MEDICATIONS:  Outpatient Encounter Medications as of 09/12/2022  Medication Sig   albuterol (VENTOLIN  HFA) 108 (90 Base) MCG/ACT inhaler Inhale 2 puffs into the lungs every 6 (six) hours as needed for wheezing or shortness of breath.   amLODipine (NORVASC) 10 MG tablet Take 1 tablet (10 mg total) by mouth daily.   ASPIRIN 81 PO Take 81 mg by mouth.   atorvastatin (LIPITOR) 80 MG tablet Take 1 tablet (80 mg total) by mouth daily at 6 PM.   buPROPion (WELLBUTRIN SR) 150 MG 12 hr tablet Take 1 tablet (150 mg total) by mouth 2 (two) times daily.   lisinopril (ZESTRIL) 40 MG tablet Take 1 tablet (40 mg total) by mouth every morning.   tamsulosin (FLOMAX) 0.4 MG CAPS capsule Take 1 capsule (0.4 mg total) by mouth daily.   No facility-administered encounter medications on file as of 09/12/2022.    PAST MEDICAL HISTORY: Past Medical History:  Diagnosis Date   Arthritis    Cervical stenosis of spine    Chronic headaches    Dental caries    periodontal disease   Frequency of urination    History of cerebral infarction 11-28-2017  per pt never had symptoms   per MRI imaging 10-31-2016 2 small chronic lacunar infartions in the right frontal periventricular white matter , and chronic left medial orbital fracture (as seen 2014 MRI)   Hypertension    Noncompliance with medication regimen    Prostate cancer urologist-  dr winter/  oncologist-  dr Kathrynn Running   dx 07-21-2017--- Stage T1c, Gleason 4+3,  PSA 13.40,  vol 49.7cc--  scheduled for radioactive seed implants 12-03-2017  PAST SURGICAL HISTORY: Past Surgical History:  Procedure Laterality Date   ABDOMINAL EXPLORATION SURGERY  age 46   per pt had Small Bowel Resection and Appendectomy   APPENDECTOMY     ARM SURGERY Left 1999   REPAIR INJURY-- "ARM CUT ABOUT OFF"   IR CATHETER TUBE CHANGE  10/28/2021   IR GUIDED DRAIN W CATHETER PLACEMENT  09/03/2021   IR US GUIDANCE  09/03/2021   PROSTATE BIOPSY  07/21/2017  dr winter office   RADIOACTIVE SEED IMPLANT N/A 12/03/2017   Procedure: RADIOACTIVE SEED IMPLANT/BRACHYTHERAPY IMPLANT;  Surgeon: Rene Paci, MD;  Location: [facility];  Service: Urology;  Laterality: N/A;  ONLY NEEDS 120 MIN   SPACE OAR INSTILLATION N/A 12/03/2017   Procedure: SPACE OAR INSTILLATION;  Surgeon: Rene Paci, MD;  Location: [facility];  Service: Urology;  Laterality: N/A;   TOOTH EXTRACTION N/A 09/21/2018   Procedure: DENTAL EXTRACTIONS WITH ALVEOLIPLASTY;  Surgeon: Ocie Doyne, DDS;  Location: [facility] OR;  Service: Oral Surgery;  Laterality: N/A;    ALLERGIES: Allergies  Allergen Reactions   Penicillins Itching    Did it involve swelling of the face/tongue/throat, SOB, or low BP? No Did it involve sudden or severe rash/hives, skin peeling, or any reaction on the inside of your mouth or nose? Yes Did you need to seek medical attention at a hospital or doctor's office? No When did it last happen?      09/2018 If all above answers are "NO", may proceed with cephalosporin use.    FAMILY HISTORY: Family History  Problem Relation Age of Onset   Lung cancer Mother    Hypertension Brother    Diabetes Paternal Grandfather    Prostate cancer Cousin     SOCIAL HISTORY: Social History   Tobacco Use   Smoking status: Every Day    Packs/day: 0.25    Years: 40.00    Additional pack years: 0.00    Total pack years: 10.00    Types: Cigarettes   Smokeless tobacco: Never  Vaping Use   Vaping Use: Never used  Substance Use Topics   Alcohol use: Yes    Alcohol/week: 21.0 standard drinks of alcohol    Types: 21 Cans of beer per week    Comment: 1-2 12oz  beers every evening   Drug use: Yes    Types: Marijuana    Comment:  per pt uses once week   Social History   Social History Narrative   Lives at home alone   Right-handed   Caffeine: tea throughout the day   03/04/19 Frank Fischer is a 62 year old male who is single living at home alone in his apartment. He voices his support system is his fiance, Baldo Daub and brother Evo Aderman. He  denies issues with transportation to medical appointments. He is independent in his care needs     OBJECTIVE: PHYSICAL EXAM: BP (!) 166/92   Pulse 78   Ht  (1.778 m)   Wt 212 lb (96.2 kg)   SpO2 92%   BMI 30.42 kg/m   General: General appearance: Awake and alert. No distress. Cooperative with exam.  Skin: No obvious rash or jaundice. HEENT: Atraumatic. Anicteric. Lungs: Conversational dyspnea  Psych: Affect appropriate.  Neurological: Mental Status: Alert. Speech fluent. No pseudobulbar affect Cranial Nerves: CNII: No RAPD. Visual fields grossly intact. CNIII, IV, VI: PERRL. No nystagmus. EOMI. CN V: Facial sensation reduced to pinprick on left compared to  right. CN VII: Facial muscles symmetric and strong. No ptosis at rest. CN VIII: Hearing grossly intact bilaterally. CN IX: No hypophonia. CN X: Palate elevates symmetrically. CN XI: Full strength shoulder shrug bilaterally. CN XII: Tongue protrusion full and midline. No atrophy or fasciculations. No significant dysarthria Motor: Tone is normal. No obvious atrophy.  Individual muscle group testing (MRC grade out of 5):  Movement     Neck flexion 5    Neck extension 5     Right Left   Shoulder abduction 5 4+   Shoulder adduction 5 4+   Shoulder ext rotation 5 4+   Shoulder int rotation 5 4+   Elbow flexion 5 5-   Elbow extension 5 5-   Finger abduction - FDI 5 5-   Finger abduction - ADM 5 5-   Finger extension 5 4+   Finger flexion  5 5   Thumb abduction - APB 4+ 4    Hip flexion 5 4+   Knee extension 5 4+   Knee flexion 5 4+   Dorsiflexion 5 5-   Plantarflexion 5 5-     Reflexes:  Right Left   Bicep 1+ 1+   Tricep 1+ 1+   BrRad 1+ 1+   Knee 2+ 2+   Ankle 1+ 1+    Pathological Reflexes: Babinski: mute response bilaterally Hoffman: absent bilaterally Troemner: absent bilaterally Sensation: Pinprick: Reduced on left face, arm, and leg compared to right.  Tinel's positive at right  carpal tunnel Coordination: Intact finger-to- nose-finger bilaterally. Romberg negative. Gait: Able to rise from chair with arms crossed unassisted. Normal, narrow-based gait.  Lab and Test Review: Internal labs: 07/09/22: HbA1c: 6.1 B12: 275 TSH: 2.33 CBC wnl CMP wnl  Imaging: MRI/MRA brain wo contrast (02/26/2019): Per my read: Multifocal right hemispheric acute strokes in frontal, parietal, and occipital lobes including in precentral and postcentral gyri.  Radiology read: FINDINGS: Frank HEAD FINDINGS   BRAIN: Multifocal acute ischemia within the right hemisphere, including along the precentral gyrus, postcentral gyrus, posterior right parietal lobe and right occipital lobe. There is no contralateral acute ischemia. There is petechial hemorrhage at the right parieto-occipital infarct site. There is mild cytotoxic edema at the ischemic sites. The white matter signal is normal for the patient's age. The cerebral and cerebellar volume are age-appropriate. There is no hydrocephalus. The midline structures are normal.   VASCULAR: The major intracranial arterial and venous sinus flow voids are normal.   SKULL AND UPPER CERVICAL SPINE: Calvarial bone marrow signal is normal. There is no skull base mass. The visualized upper cervical spine and soft tissues are normal.   SINUSES/ORBITS: There are no fluid levels or advanced mucosal thickening. The mastoid air cells and middle ear cavities are free of fluid. The orbits are normal.   Frank CIRCLE OF WILLIS FINDINGS   POSTERIOR CIRCULATION:   --Vertebral arteries: Normal V4 segments.   --Posterior inferior cerebellar arteries (PICA): Patent origins from the vertebral arteries.   --Anterior inferior cerebellar arteries (AICA): Patent origins from the basilar artery.   --Basilar artery: Normal.   --Superior cerebellar arteries: Normal.   --Posterior cerebral arteries: Normal. The right PCA is partially supplied by a small  posterior communicating artery (p-comm).   ANTERIOR CIRCULATION:   --Intracranial internal carotid arteries: Severe stenosis of the distal right cavernous segment (series 7, image 90). Multifocal mild stenosis of the left cavernous and clinoid segments.   --Anterior cerebral arteries (ACA): Normal. Both A1 segments are present. Patent anterior communicating artery (  a-comm).   --Middle cerebral arteries (MCA): Normal.   MRA NECK FINDINGS   Aortic arch: Normal 3 vessel aortic branching pattern. The visualized subclavian arteries are normal.   Right carotid system: Normal course and caliber without stenosis or evidence of dissection.   Left carotid system: Normal course and caliber without stenosis or evidence of dissection.   Vertebral arteries: Codominant. Vertebral artery origins are normal. Vertebral arteries are normal in course and caliber to the vertebrobasilar confluence without stenosis or evidence of dissection.   IMPRESSION: 1. Multifocal acute ischemia within the right hemisphere, including along the precentral gyrus, postcentral gyrus, posterior right parietal lobe and right occipital lobe. 2. Petechial hemorrhage at the right parieto-occipital infarct site. No mass effect. 3. 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 Multifocal mild stenosis of the left cavernous and clinoid segments. 4. Normal MRA of the neck. No proximal embolic source is identified. Correlation with echocardiography may be helpful.  CTA head and neck (02/26/2019): FINDINGS: CTA NECK FINDINGS   Aortic arch: Standard branching. Included portions of the aortic arch demonstrate no evidence of dissection or aneurysm.   Right carotid system: CCA and ICA patent within the neck without significant stenosis (50% or greater). The proximal right ICA is somewhat tortuous. No significant atherosclerotic plaque.   Left carotid system: CCA and ICA patent within  the neck without significant stenosis (50% or greater). Mild predominantly noncalcified plaque within the distal common carotid artery and at the bifurcation.   Vertebral arteries: The bilateral vertebral arteries are patent within the neck without significant stenosis. The left vertebral artery is slightly dominant.   Skeleton: Reversal of the expected cervical lordosis. Mild cervical spondylosis. No acute bony abnormality.   Other neck: No soft tissue neck mass or pathologically enlarged cervical chain lymph nodes. Thyroid unremarkable.   Upper chest: Paraseptal and central lobule emphysema within the imaged lung apices.   Review of the MIP images confirms the above findings   CTA HEAD FINDINGS   Anterior circulation:   The intracranial right internal carotid artery is patent. There is prominent soft and calcified plaque within the cavernous and paraclinoid right ICA. Resultant severe stenosis within the cavernous right ICA (series 9, image 84). Mild to moderate stenosis also present within the paraclinoid right ICA.   Calcified atherosclerotic plaque within the cavernous and paraclinoid left ICA. Mild-to-moderate stenosis within the cavernous left ICA with otherwise no more than mild luminal narrowing.   The right middle and anterior cerebral arteries are patent without significant proximal stenosis.   The left middle and anterior cerebral arteries are patent without significant proximal stenosis.   No intracranial aneurysm is identified.   Posterior circulation:   The intracranial vertebral arteries are patent without significant stenosis.   The basilar artery is patent without significant stenosis.   The right posterior cerebral artery is patent without significant proximal stenosis.   The left posterior cerebral artery is patent. There are mild-to-moderate focal stenoses within the proximal and distal P2 left posterior cerebral artery.   Venous sinuses:  Within limitations of contrast timing, no convincing thrombus.   Anatomic variants: None significant   Review of the MIP images confirms the above findings   IMPRESSION: CTA head:   1. 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 Confirmed severe stenosis within the cavernous right ICA. Additional sites of mild to moderate stenosis as described. 2. Mild-to-moderate focal stenoses within the proximal and distal P2 left posterior cerebral artery.   CTA  neck:   Common carotid, internal carotid and vertebral arteries patent within the neck bilaterally without significant stenosis. Mild scattered atherosclerotic plaque within the bilateral carotid systems. No evidence of dissection or aneurysm.  CT cervical spine wo contrast (10/29/2018): FINDINGS: Alignment: Mild reversal of cervical lordosis centered at the level of C5, presumably positional. Alignment is otherwise anatomic.   Skull base and vertebrae: No acute fracture. No primary bone lesion or focal pathologic process.   Soft tissues and spinal canal: No prevertebral fluid or swelling. No visible canal hematoma.   Disc levels: Multilevel degenerative disc disease, most severe at C5-C6 and C6-C7. Mild multilevel facet arthropathy.   Upper chest: Negative.   Other: None.   IMPRESSION: 1. No acute abnormality of the cervical spine. 2. Mild multilevel degenerative disc disease and cervical spondylosis, as above.  ASSESSMENT: Frank Fischer is a 62 y.o. male who presents for evaluation of bilateral hand pain, numbness, and tingling. He has a relevant medical history of stroke (2020, right hemisphere), HTN, HLD, COPD, prostate cancer, OA, chronic headaches, and cervical radiculopathy. His neurological examination is pertinent for residual left sided weakness and numbness and positive Tinel's test at right carpal tunnel. His left arm is chronic numb since cutting his left forearm. Available  diagnostic data is significant for MRI brain in 2020 showed multifocal acute right hemispheric stroke. 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   Patient's current hand pain, numbness, and tingling could be the result of carpal tunnel, cervical radiculopathy, and possible nerve damage on left from previous injury to left forearm. I will get an EMG to better characterize.  PLAN: -EMG of bilateral upper extremities (CTS vs cervical radiculopathy) -May consider NMUS if EMG suggests nerve injury in left forearm -Gave information on wrist splinting for CTS -B12 1000 mcg daily for borderline low B12 -Continue aspirin 81 mg daily and atorvastatin 80 mg daily for secondary stroke prevention  -Return to clinic to be determined  The impression above as well as the plan as outlined below were extensively discussed with the patient (in the company of friend) who voiced understanding. All questions were answered to their satisfaction.  When available, results of the above investigations and possible further recommendations will be communicated to the patient via telephone/MyChart. Patient to call office if not contacted after expected testing turnaround time.   Thank you for allowing me to participate in patient's care.  If I can answer any additional questions, I would be pleased to do so.  Jacquelyne Balint, MD   CC: Ngetich, Donalee Citrin, NP [address] [place] [address]  CC: Referring provider: Caesar Bookman, NP [address] [place],  [place] [address]

## 2022-09-12 ENCOUNTER — Ambulatory Visit (INDEPENDENT_AMBULATORY_CARE_PROVIDER_SITE_OTHER): Payer: Medicaid Other | Admitting: Neurology

## 2022-09-12 ENCOUNTER — Encounter: Payer: Self-pay | Admitting: Neurology

## 2022-09-12 VITALS — BP 128/84 | HR 78 | Ht 70.0 in | Wt 212.0 lb

## 2022-09-12 DIAGNOSIS — R202 Paresthesia of skin: Secondary | ICD-10-CM

## 2022-09-12 DIAGNOSIS — M5010 Cervical disc disorder with radiculopathy, unspecified cervical region: Secondary | ICD-10-CM | POA: Diagnosis not present

## 2022-09-12 DIAGNOSIS — R200 Anesthesia of skin: Secondary | ICD-10-CM | POA: Diagnosis not present

## 2022-09-12 DIAGNOSIS — M79642 Pain in left hand: Secondary | ICD-10-CM

## 2022-09-12 DIAGNOSIS — M79641 Pain in right hand: Secondary | ICD-10-CM

## 2022-09-12 NOTE — Patient Instructions (Addendum)
I saw you today for numbness and tingling in both hands. This is likely due to a pinched nerve at your wrist (carpal tunnel syndrome) or in your neck. I will get a muscle and nerve test called EMG to investigate further (see below for more information).  When I have those results, we will discuss the next steps.  You may consider wrist splints to wear at night for carpal tunnel that can help with symptoms.  Cock up wrist splint for carpal tunnel symptoms can be bought in local drug stores or online. This should especially be worn at night while sleeping.     Your B12 was borderline low. Given your symptoms, I would recommend supplementing with B12 1000 mcg daily. This can be bought over the counter at any local drug store or online.   The physicians and staff at [facility] are committed to providing excellent care. You may receive a survey requesting feedback about your experience at our office. We strive to receive "very good" responses to the survey questions. If you feel that your experience would prevent you from giving the office a "very good " response, please contact our office to try to remedy the situation. We may be reached at [phone]. Thank you for taking the time out of your busy day to complete the survey.  Jacquelyne Balint, MD [facility]   ELECTROMYOGRAM AND NERVE CONDUCTION STUDIES (EMG/NCS) INSTRUCTIONS  How to Prepare The neurologist conducting the EMG will need to know if you have certain medical conditions. Tell the neurologist and other EMG lab personnel if you: Have a pacemaker or any other electrical medical device Take blood-thinning medications Have hemophilia, a blood-clotting disorder that causes prolonged bleeding Bathing Take a shower or bath shortly before your exam in order to remove oils from your skin. Don't apply lotions or creams before the exam.  What to Expect You'll likely be asked to change into a hospital gown for the procedure and lie  down on an examination table. The following explanations can help you understand what will happen during the exam.  Electrodes. The neurologist or a technician places surface electrodes at various locations on your skin depending on where you're experiencing symptoms. Or the neurologist may insert needle electrodes at different sites depending on your symptoms.  Sensations. The electrodes will at times transmit a tiny electrical current that you may feel as a twinge or spasm. The needle electrode may cause discomfort or pain that usually ends shortly after the needle is removed. If you are concerned about discomfort or pain, you may want to talk to the neurologist about taking a short break during the exam.  Instructions. During the needle EMG, the neurologist will assess whether there is any spontaneous electrical activity when the muscle is at rest - activity that isn't present in healthy muscle tissue - and the degree of activity when you slightly contract the muscle.  He or she will give you instructions on resting and contracting a muscle at appropriate times. Depending on what muscles and nerves the neurologist is examining, he or she may ask you to change positions during the exam.  After your EMG You may experience some temporary, minor bruising where the needle electrode was inserted into your muscle. This bruising should fade within several days. If it persists, contact your primary care doctor.

## 2022-09-13 ENCOUNTER — Other Ambulatory Visit: Payer: Medicaid Other

## 2022-09-13 NOTE — Patient Outreach (Signed)
Medicaid Managed Care Social Work Note  09/13/2022 Name:  Frank Fischer MRN:  [id number] DOB:  [birth year]  Frank Fischer is an 62 y.o. year old male who is a primary patient of Ngetich, Dinah C, NP.  The [facility] Managed Care Coordination team was consulted for assistance with:   medication assistance  Mr. Wisehart was given information about Medicaid Managed Care Coordination team services today. [name] Patient agreed to services and verbal consent obtained.  Engaged with patient  for by telephone forinitial visit in response to referral for case management and/or care coordination services.   Assessments/Interventions:  Review of past medical history, allergies, medications, health status, including review of consultants reports, laboratory and other test data, was performed as part of comprehensive evaluation and provision of chronic care management services.  SDOH: (Social Determinant of Health) assessments and interventions performed: BSW completed a telephone outreach with patient. Patient states he is unable to afford his medications. BSW informed patient he could switch his pharmacy and/or see if he could charge his medications. BSW will send patient resources for rent and utilities to help free up some of his monthly funds for his medication copays. No other resources are needed at this time.  Advanced Directives Status:  Not addressed in this encounter.  Care Plan                 Allergies  Allergen Reactions   Penicillins Itching    Did it involve swelling of the face/tongue/throat, SOB, or low BP? No Did it involve sudden or severe rash/hives, skin peeling, or any reaction on the inside of your mouth or nose? Yes Did you need to seek medical attention at a hospital or doctor's office? No When did it last happen?      09/2018 If all above answers are "NO", may proceed with cephalosporin use.    Medications Reviewed Today     Reviewed by Antony Madura, MD  (Physician) on 09/12/22 at 1106  Med List Status: <None>   Medication Order Taking? Sig Documenting Provider Last Dose Status Informant  albuterol (VENTOLIN HFA) 108 (90 Base) MCG/ACT inhaler [id number] Yes Inhale 2 puffs into the lungs every 6 (six) hours as needed for wheezing or shortness of breath. Ngetich, Dinah C, NP Taking Active   amLODipine (NORVASC) 10 MG tablet [id number] Yes Take 1 tablet (10 mg total) by mouth daily. Ngetich, Dinah C, NP Taking Active   ASPIRIN 81 PO [id number] Yes Take 81 mg by mouth. [provider] Taking Active Self  atorvastatin (LIPITOR) 80 MG tablet [id number] Yes Take 1 tablet (80 mg total) by mouth daily at 6 PM. Sharee Holster, NP Taking Active   buPROPion [facility] SR) 150 MG 12 hr tablet [id number] Yes Take 1 tablet (150 mg total) by mouth 2 (two) times daily. Ngetich, Dinah C, NP Taking Active   lisinopril (ZESTRIL) 40 MG tablet [id number] Yes Take 1 tablet (40 mg total) by mouth every morning. Sharee Holster, NP Taking Active   tamsulosin [facility]) 0.4 MG CAPS capsule [id number] Yes Take 1 capsule (0.4 mg total) by mouth daily. Ngetich, Donalee Citrin, NP Taking Active             Patient Active Problem List   Diagnosis Date Noted   History of urinary retention 07/31/2022   Acute CVA (cerebrovascular accident) (HCC) 02/26/2019   Polysubstance abuse (HCC) 02/26/2019   Malignant neoplasm of prostate (HCC) 10/02/2017   Noncompliance with medication regimen 12/18/2016  Chronic tension type headache 10/20/2016   Headache(784.0) 10/19/2013   Itching 10/19/2013   Smoker 10/19/2013   Essential hypertension, benign 07/08/2013   Pain in joint, shoulder region 07/08/2013   Cervical disc disorder with radiculopathy of cervical region 07/08/2013   Dental caries 07/08/2013   Spinal stenosis in cervical region 07/07/2013   Tendinitis of left rotator cuff 07/07/2013    Conditions to be addressed/monitored per PCP order:   rent and utility  resources  There are no care plans that you recently modified to display for this patient.   Follow up:  Patient agrees to Care Plan and Follow-up.  Plan: The Managed Medicaid care management team will reach out to the patient again over the next 30 days.  Date/time of next scheduled Social Work care management/care coordination outreach:  10/14/22  Gus Puma, Kenard Gower, [facility]  Managed [facility] Social Worker [phone]

## 2022-09-13 NOTE — Patient Instructions (Signed)
Visit Information  Frank Fischer was given information about Medicaid Managed Care team care coordination services as a part of their [facility] Community Plan Medicaid benefit. Frank Fischer verbally consented to engagement with the [facility] Managed Care team.   If you are experiencing a medical emergency, please call 911 or report to your local emergency department or urgent care.   If you have a non-emergency medical problem during routine business hours, please contact your provider's office and ask to speak with a nurse.   For questions related to your [facility], please call: [phone] or visit the homepage here: [email or web address]  If you would like to schedule transportation through your [facility], please call the following number at least 2 days in advance of your appointment: [phone]   Rides for urgent appointments can also be made after hours by calling Member Services.  Call the Behavioral Health Crisis Line at [phone], at any time, 24 hours a day, 7 days a week. If you are in danger or need immediate medical attention call 911.  If you would like help to quit smoking, call 1-800-QUIT-NOW (832-699-6307) OR Espaol: 1-855-Djelo-Ya (7-253-664-4034) o para ms informacin haga clic aqu or Text READY to 742-595 to register via text  Mr. Schalk - following are the goals we discussed in your visit today:   Goals Addressed   None       Social Worker will follow up in 30 days.  Frank Fischer, Frank Fischer, MHA [facility]  Managed Medicaid Social Worker [phone]   Following is a copy of your plan of care:  There are no care plans that you recently modified to display for this patient.

## 2022-10-03 ENCOUNTER — Ambulatory Visit: Payer: Medicaid Other | Admitting: Neurology

## 2022-10-03 ENCOUNTER — Encounter: Payer: Self-pay | Admitting: Family

## 2022-10-04 ENCOUNTER — Inpatient Hospital Stay: Admission: RE | Admit: 2022-10-04 | Payer: Medicaid Other | Source: Ambulatory Visit

## 2022-10-07 ENCOUNTER — Encounter: Payer: Medicaid Other | Admitting: Neurology

## 2022-10-11 ENCOUNTER — Other Ambulatory Visit: Payer: Medicaid Other

## 2022-10-11 NOTE — Patient Outreach (Signed)
Medicaid Managed Care Social Work Note  10/11/2022 Name:  Frank Fischer MRN:  [id number] DOB:  [birth year]  Frank Fischer is an 62 y.o. year old male who is a primary patient of Ngetich, Dinah C, NP.  The Medicaid Managed Care Coordination team was consulted for assistance with:  Community Resources   Frank Fischer was given information about Medicaid Managed Care Coordination team services today. Frank Fischer Patient agreed to services and verbal consent obtained.  Engaged with patient  for by telephone forfollow up visit in response to referral for case management and/or care coordination services.   Assessments/Interventions:  Review of past medical history, allergies, medications, health status, including review of consultants reports, laboratory and other test data, was performed as part of comprehensive evaluation and provision of chronic care management services.  SDOH: (Social Determinant of Health) assessments and interventions performed:  BSW completed a telephone outreach with patient, he did receive resources BSW sent to him. Patient states he has not spoken with his pharmacy about charging his medications yet, He will do so at the beginning of the month. No other reosurces are needed at this time. Advanced Directives Status:  Not addressed in this encounter.  Care Plan                 Allergies  Allergen Reactions   Penicillins Itching    Did it involve swelling of the face/tongue/throat, SOB, or low BP? No Did it involve sudden or severe rash/hives, skin peeling, or any reaction on the inside of your mouth or nose? Yes Did you need to seek medical attention at a hospital or doctor's office? No When did it last happen?      09/2018 If all above answers are "NO", may proceed with cephalosporin use.    Medications Reviewed Today     Reviewed by Frank Madura, MD (Physician) on 09/12/22 at 1106  Med List Status: <None>   Medication Order Taking? Sig Documenting Provider  Last Dose Status Informant  albuterol (VENTOLIN HFA) 108 (90 Base) MCG/ACT inhaler [id number] Yes Inhale 2 puffs into the lungs every 6 (six) hours as needed for wheezing or shortness of breath. Ngetich, Dinah C, NP Taking Active   amLODipine (NORVASC) 10 MG tablet [id number] Yes Take 1 tablet (10 mg total) by mouth daily. Ngetich, Dinah C, NP Taking Active   ASPIRIN 81 PO [id number] Yes Take 81 mg by mouth. [provider] Taking Active Self  atorvastatin (LIPITOR) 80 MG tablet [id number] Yes Take 1 tablet (80 mg total) by mouth daily at 6 PM. Sharee Holster, NP Taking Active   buPROPion [facility] SR) 150 MG 12 hr tablet [id number] Yes Take 1 tablet (150 mg total) by mouth 2 (two) times daily. Ngetich, Dinah C, NP Taking Active   lisinopril (ZESTRIL) 40 MG tablet [id number] Yes Take 1 tablet (40 mg total) by mouth every morning. Sharee Holster, NP Taking Active   tamsulosin [facility]) 0.4 MG CAPS capsule [id number] Yes Take 1 capsule (0.4 mg total) by mouth daily. Ngetich, Donalee Citrin, NP Taking Active             Patient Active Problem List   Diagnosis Date Noted   History of urinary retention 07/31/2022   Acute CVA (cerebrovascular accident) (HCC) 02/26/2019   Polysubstance abuse (HCC) 02/26/2019   Malignant neoplasm of prostate (HCC) 10/02/2017   Noncompliance with medication regimen 12/18/2016   Chronic tension type headache 10/20/2016   Headache(784.0) 10/19/2013   Itching 10/19/2013  Smoker 10/19/2013   Essential hypertension, benign 07/08/2013   Pain in joint, shoulder region 07/08/2013   Cervical disc disorder with radiculopathy of cervical region 07/08/2013   Dental caries 07/08/2013   Spinal stenosis in cervical region 07/07/2013   Tendinitis of left rotator cuff 07/07/2013    Conditions to be addressed/monitored per PCP order:   community resources  There are no care plans that you recently modified to display for this patient.   Follow up:  Patient agrees to  Care Plan and Follow-up.  Plan: The  Patient has been provided with contact information for the Managed Medicaid care management team and has been advised to call with any health related questions or concerns.   Frank Fischer, MHA [facility]  Managed [facility] Social Worker [phone]

## 2022-10-11 NOTE — Patient Instructions (Signed)
Visit Information  Frank Fischer was given information about Medicaid Managed Care team care coordination services as a part of their [facility] Community Plan Medicaid benefit. Frank Fischer verbally consented to engagement with the [facility] Managed Care team.   If you are experiencing a medical emergency, please call 911 or report to your local emergency department or urgent care.   If you have a non-emergency medical problem during routine business hours, please contact your provider's office and ask to speak with a nurse.   For questions related to your [facility], please call: [phone] or visit the homepage here: [email or web address]  If you would like to schedule transportation through your [facility], please call the following number at least 2 days in advance of your appointment: [phone]   Rides for urgent appointments can also be made after hours by calling Member Services.  Call the Behavioral Health Crisis Line at [phone], at any time, 24 hours a day, 7 days a week. If you are in danger or need immediate medical attention call 911.  If you would like help to quit smoking, call 1-800-QUIT-NOW (979-755-3013) OR Espaol: 1-855-Djelo-Ya (1-324-401-0272) o para ms informacin haga clic aqu or Text READY to 536-644 to register via text  [name] - following are the goals we discussed in your visit today:   Goals Addressed   None       The  Patient                                              has been provided with contact information for the Managed Medicaid care management team and has been advised to call with any health related questions or concerns.   Frank Fischer, Frank Fischer, MHA [facility] Health  Managed Medicaid Social Worker [phone]   Following is a copy of your plan of care:  There are no care plans that you recently modified to display for  this patient.

## 2022-11-04 ENCOUNTER — Other Ambulatory Visit: Payer: Self-pay

## 2022-11-04 ENCOUNTER — Ambulatory Visit (INDEPENDENT_AMBULATORY_CARE_PROVIDER_SITE_OTHER): Payer: Medicaid Other | Admitting: Neurology

## 2022-11-04 ENCOUNTER — Telehealth: Payer: Self-pay | Admitting: Neurology

## 2022-11-04 DIAGNOSIS — M5010 Cervical disc disorder with radiculopathy, unspecified cervical region: Secondary | ICD-10-CM

## 2022-11-04 DIAGNOSIS — R200 Anesthesia of skin: Secondary | ICD-10-CM

## 2022-11-04 DIAGNOSIS — R202 Paresthesia of skin: Secondary | ICD-10-CM

## 2022-11-04 DIAGNOSIS — M79641 Pain in right hand: Secondary | ICD-10-CM

## 2022-11-04 NOTE — Procedures (Signed)
[facility]  [address] [place], [address]  [place], [place] [address] Tel: [phone] Fax: [phone] Test Date:  11/04/2022  Patient: Frank Fischer DOB: 11-06-1960 Physician: Jacquelyne Balint, MD  Sex: Male Height: 5\' 10"  Ref Phys: Jacquelyne Balint, MD  ID#: [id number]   Technician:    History: This is a 62 year old male with bilateral hand pain, numbness, and tingling.  NCV & EMG Findings: Extensive electrodiagnostic evaluation of bilateral upper limbs shows: Bilateral median, ulnar, and radial sensory responses are within normal limits. The right ulnar sensory response is 41% of the response in the left ulnar sensory response. Left median (APB) motor response shows reduced amplitude (4.3 mV). The right median (APB) motor response is within normal limits. Left ulnar (ADM) motor response shows reduced amplitude (4.2 mV). Right ulnar (ADM) motor response shows decreased conduction velocity from above the elbow to below the elbow stimulation sites (40 m/s). Chronic motor axon loss changes without accompanying active denervation changes are seen in bilateral first dorsal interosseous, bilateral extensor indicis proprius, bilateral flexor carpi ulnaris, bilateral triceps, bilateral cervical paraspinal (C7 level), and left abductor pollicis brevis muscles.  Impression: This is an abnormal study. 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 The findings are mild in degree electrically at right C7 and right C8 levels. The findings are moderate in degree electrically at left C8 level. The findings are moderate to severe at left C7 and left T1 levels. 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 Severity is difficult to grade due to #1 above. No  electrodiagnostic evidence of a right or left median mononeuropathy at or distal to the wrist (ie: carpal tunnel syndrome).     ___________________________ Jacquelyne Balint, MD    Nerve Conduction Studies Motor Nerve Results    Latency Amplitude F-Lat Segment Distance CV Comment  Site (ms) Norm (mV) Norm (ms)  (cm) (m/s) Norm   Left Median (APB) Motor  Wrist 2.5  < 4.0 *4.3  > 5.0        Elbow 8.2 - 4.2 -  Elbow-Wrist 32 56  > 50   Right Median (APB) Motor  Wrist 2.4  < 4.0 7.8  > 5.0        Elbow 7.7 - 7.2 -  Elbow-Wrist 31.5 59  > 50   Left Ulnar (ADM) Motor  Wrist 2.3  < 3.1 *4.2  > 7.0        Bel elbow 6.7 - 4.0 -  Bel elbow-Wrist 22.5 51  > 50   Ab elbow 8.7 - 3.5 -  Ab elbow-Bel elbow 10 50 -   Right Ulnar (ADM) Motor  Wrist 1.98  < 3.1 7.9  > 7.0        Bel elbow 6.6 - 7.4 -  Bel elbow-Wrist 24 52  > 50   Ab elbow 9.1 - 6.8 -  Ab elbow-Bel elbow 10 40 -    Sensory Sites    Neg Peak Lat Amplitude (O-P) Segment Distance Velocity Comment  Site (ms) Norm (V) Norm  (cm) (ms)   Left Median Sensory  Wrist-Dig II 2.9  < 3.8 16  > 10 Wrist-Dig II 13    Right Median Sensory  Wrist-Dig II 2.9  < 3.8 13  > 10  Wrist-Dig II 13    Left Radial Sensory  Forearm-Wrist 2.1  < 2.8 22  > 10 Forearm-Wrist 10    Right Radial Sensory  Forearm-Wrist 2.2  < 2.8 21  > 10 Forearm-Wrist 10    Left Ulnar Sensory  Wrist-Dig V 2.6  < 3.2 17  > 5 Wrist-Dig V 11    Right Ulnar Sensory  Wrist-Dig V 2.7  < 3.2 10  > 5 Wrist-Dig V 11     Electromyography   Side Muscle Ins.Act Fibs Fasc Recrt Amp Dur Poly Activation Comment  Left FDI Nml Nml Nml *2- *1+ *1+ *1+ Nml N/A  Left EIP Nml Nml Nml Nml Nml Nml Nml Nml N/A  Left APB Nml Nml Nml *3- *2+ *2+ *2+ Nml N/A  Left Pronator teres Nml Nml Nml Nml Nml Nml Nml Nml N/A  Left FCU Nml Nml Nml *2- *2+ *2+ *2+ Nml N/A  Left Biceps Nml Nml Nml Nml Nml Nml Nml Nml N/A  Left Triceps Nml Nml Nml *3- *2+ *2+ *2+ Nml N/A  Left Deltoid Nml Nml Nml Nml Nml Nml  Nml Nml N/A  Left C7 PSP Nml Nml Nml *1- *1+ *1+ Nml Nml N/A  Right FDI Nml Nml Nml *1- *1+ *1+ *1+ Nml N/A  Right EIP Nml Nml Nml *1- *1+ *1+ *1+ Nml N/A  Right APB Nml Nml Nml Nml Nml Nml Nml Nml N/A  Right Pronator teres Nml Nml Nml Nml Nml Nml Nml Nml N/A  Right FCU Nml Nml Nml *2- *1+ *1+ *1+ Nml N/A  Right Biceps Nml Nml Nml Nml Nml Nml Nml Nml N/A  Right Triceps Nml Nml Nml *2- *1+ *1+ *1+ Nml N/A  Right Deltoid Nml Nml Nml Nml Nml Nml Nml Nml N/A  Right C7 PSP Nml Nml Nml *1- *1+ *1+ Nml Nml N/A      Waveforms:  Motor           Sensory

## 2022-11-04 NOTE — Telephone Encounter (Signed)
Discussed the results of patient's EMG after the procedure today. It showed bilateral radiculopathy and likely an overlapping right ulnar at the elbow. There were no active changes, suggesting that current symptoms are the residuals of radiculopathy.  We discussed and will get updated MRI cervical spine and send patient to PT to attempt to improve symptoms.  All questions were answered.  Jacquelyne Balint, MD [facility]

## 2022-11-26 NOTE — Therapy (Unsigned)
OUTPATIENT PHYSICAL THERAPY CERVICAL EVALUATION   Patient Name: Frank Fischer MRN: [id number] DOB:1960/12/18, 62 y.o., male Today's Date: 11/26/2022  END OF SESSION:   Past Medical History:  Diagnosis Date   Arthritis    Cervical stenosis of spine    Chronic headaches    Dental caries    periodontal disease   Frequency of urination    History of cerebral infarction 11-28-2017  per pt never had symptoms   per MRI imaging 10-31-2016 2 small chronic lacunar infartions in the right frontal periventricular white matter , and chronic left medial orbital fracture (as seen 2014 MRI)   Hypertension    Noncompliance with medication regimen    Prostate cancer [facility]) urologist-  dr winter/  oncologist-  dr Kathrynn Running   dx 07-21-2017--- Stage T1c, Gleason 4+3,  PSA 13.40,  vol 49.7cc--  scheduled for radioactive seed implants 12-03-2017   Past Surgical History:  Procedure Laterality Date   ABDOMINAL EXPLORATION SURGERY  age 68   per pt had Small Bowel Resection and Appendectomy   APPENDECTOMY     ARM SURGERY Left 1999   REPAIR INJURY-- "ARM CUT ABOUT OFF"   IR CATHETER TUBE CHANGE  10/28/2021   IR GUIDED DRAIN W CATHETER PLACEMENT  09/03/2021   IR US GUIDANCE  09/03/2021   PROSTATE BIOPSY  07/21/2017  dr winter office   RADIOACTIVE SEED IMPLANT N/A 12/03/2017   Procedure: RADIOACTIVE SEED IMPLANT/BRACHYTHERAPY IMPLANT;  Surgeon: Rene Paci, MD;  Location: [facility];  Service: Urology;  Laterality: N/A;  ONLY NEEDS 120 MIN   SPACE OAR INSTILLATION N/A 12/03/2017   Procedure: SPACE OAR INSTILLATION;  Surgeon: Rene Paci, MD;  Location: [facility];  Service: Urology;  Laterality: N/A;   TOOTH EXTRACTION N/A 09/21/2018   Procedure: DENTAL EXTRACTIONS WITH ALVEOLIPLASTY;  Surgeon: Ocie Doyne, DDS;  Location: [facility];  Service: Oral Surgery;  Laterality: N/A;   Patient Active Problem List   Diagnosis Date Noted   History of urinary  retention 07/31/2022   Acute CVA (cerebrovascular accident) (HCC) 02/26/2019   Polysubstance abuse (HCC) 02/26/2019   Malignant neoplasm of prostate (HCC) 10/02/2017   Noncompliance with medication regimen 12/18/2016   Chronic tension type headache 10/20/2016   Headache(784.0) 10/19/2013   Itching 10/19/2013   Smoker 10/19/2013   Essential hypertension, benign 07/08/2013   Pain in joint, shoulder region 07/08/2013   Cervical disc disorder with radiculopathy of cervical region 07/08/2013   Dental caries 07/08/2013   Spinal stenosis in cervical region 07/07/2013   Tendinitis of left rotator cuff 07/07/2013    PCP: ***  REFERRING PROVIDER: Antony Madura, MD  REFERRING DIAG: M50.10 (ICD-10-CM) - Cervical disc disorder with radiculopathy of cervical region M79.641,M79.642 (ICD-10-CM) - Pain in both hands R20.0,R20.2 (ICD-10-CM) - Numbness and tingling in both hands  THERAPY DIAG:  No diagnosis found.  Rationale for Evaluation and Treatment: Rehabilitation  ONSET DATE: ***  SUBJECTIVE:  SUBJECTIVE STATEMENT: *** Hand dominance: {MISC; OT HAND [phone]}  PERTINENT HISTORY:  HPI: This is [name], a 62 y.o. right-handed male with a medical history of stroke (2020, right hemisphere), HTN, HLD, COPD, prostate cancer, OA, chronic headaches, and cervical radiculopathy who presents to neurology clinic with the chief complaint of pain in bilateral hands. The patient is accompanied by friend`.   Patient has had symptoms for a few years. He cut his left arm around 2000. The arm has been numb in the anterior left forearm where he was cut since then. He describes achiness that he says is like squeezing with numbness and tingling. He thinks the left is worse than the right. He  sometimes has difficulty picking things up. His symptoms are constant and not worse at night. The symptoms do not wake him up at night. He has had 2 surgeries on his left forearm previous due to ligament damage per patient.    He has a history of cervical radiculopathy and has pain that comes and goes. He gets cramping of the neck. He denies radiating pain. He has had nerve blocks in the past, but this did not change the symptoms in his arm.   He denies any numbness, tingling, or weakness in the legs.    PAIN:  Are you having pain? {OPRCPAIN:27236}  PRECAUTIONS: {Therapy precautions:24002}  WEIGHT BEARING RESTRICTIONS: {Yes ***/No:24003}  FALLS:  Has patient fallen in last 6 months? {fallsyesno:27318}  LIVING ENVIRONMENT: Lives with: {[facility] lives with:25569::"lives with their family"} Lives in: {Lives in:25570} Stairs: {opstairs:27293} Has following equipment at home: {Assistive devices:23999}  OCCUPATION: ***  PLOF: {PLOF:24004}  PATIENT GOALS: ***  NEXT MD VISIT: ***  OBJECTIVE:   DIAGNOSTIC FINDINGS:  Impression: This is an abnormal study. 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 The findings are mild in degree electrically at right C7 and right C8 levels. The findings are moderate in degree electrically at left C8 level. The findings are moderate to severe at left C7 and left T1 levels. 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 Severity is difficult to grade due to #1 above. No electrodiagnostic evidence of a right or left median mononeuropathy at or distal to the wrist (ie: carpal tunnel syndrome).     PATIENT SURVEYS:  FOTO ***  COGNITION: Overall cognitive status:  {cognition:24006}  SENSATION: {sensation:27233}  POSTURE: {posture:25561}  PALPATION: ***   CERVICAL ROM:   {AROM/PROM:27142} ROM A/PROM (deg) eval  Flexion   Extension   Right lateral flexion   Left lateral flexion   Right rotation   Left rotation    (Blank rows = not tested)  UPPER EXTREMITY ROM:  {AROM/PROM:27142} ROM Right eval Left eval  Shoulder flexion    Shoulder extension    Shoulder abduction    Shoulder adduction    Shoulder extension    Shoulder internal rotation    Shoulder external rotation    Elbow flexion    Elbow extension    Wrist flexion    Wrist extension    Wrist ulnar deviation    Wrist radial deviation    Wrist pronation    Wrist supination     (Blank rows = not tested)  UPPER EXTREMITY MMT:  MMT Right eval Left eval  Shoulder flexion  Shoulder extension    Shoulder abduction    Shoulder adduction    Shoulder extension    Shoulder internal rotation    Shoulder external rotation    Middle trapezius    Lower trapezius    Elbow flexion    Elbow extension    Wrist flexion    Wrist extension    Wrist ulnar deviation    Wrist radial deviation    Wrist pronation    Wrist supination    Grip strength     (Blank rows = not tested)  CERVICAL SPECIAL TESTS:  {Cervical special tests:25246}  FUNCTIONAL TESTS:  {Functional tests:24029}  TODAY'S TREATMENT:                                                                                                                              DATE: ***   PATIENT EDUCATION:  Education details: *** Person educated: {Person educated:25204} Education method: {Education Method:25205} Education comprehension: {Education Comprehension:25206}  HOME EXERCISE PROGRAM: ***  ASSESSMENT:  CLINICAL IMPRESSION: Patient is a *** y.o. *** who was seen today for physical therapy evaluation and treatment for ***.   OBJECTIVE IMPAIRMENTS: {opptimpairments:25111}.   ACTIVITY LIMITATIONS:  {activitylimitations:27494}  PARTICIPATION LIMITATIONS: {participationrestrictions:25113}  PERSONAL FACTORS: {Personal factors:25162} are also affecting patient's functional outcome.   REHAB POTENTIAL: {rehabpotential:25112}  CLINICAL DECISION MAKING: {clinical decision making:25114}  EVALUATION COMPLEXITY: {Evaluation complexity:25115}   GOALS: Goals reviewed with patient? {yes/no:20286}  SHORT TERM GOALS: Target date: ***  *** Baseline:  Goal status: INITIAL  2.  *** Baseline:  Goal status: INITIAL  3.  *** Baseline:  Goal status: INITIAL  4.  *** Baseline:  Goal status: INITIAL  5.  *** Baseline:  Goal status: INITIAL  6.  *** Baseline:  Goal status: INITIAL  LONG TERM GOALS: Target date: ***  *** Baseline:  Goal status: INITIAL  2.  *** Baseline:  Goal status: INITIAL  3.  *** Baseline:  Goal status: INITIAL  4.  *** Baseline:  Goal status: INITIAL  5.  *** Baseline:  Goal status: INITIAL  6.  *** Baseline:  Goal status: INITIAL   PLAN:  PT FREQUENCY: {rehab frequency:25116}  PT DURATION: {rehab duration:25117}  PLANNED INTERVENTIONS: {rehab planned interventions:25118::"Therapeutic exercises","Therapeutic activity","Neuromuscular re-education","Balance training","Gait training","Patient/Family education","Self Care","Joint mobilization"}  PLAN FOR NEXT SESSION: ***   Jaiah Weigel, PT 11/26/2022, 9:43 AM

## 2022-11-27 ENCOUNTER — Ambulatory Visit: Payer: Medicaid Other | Attending: Neurology | Admitting: Physical Therapy

## 2022-11-27 ENCOUNTER — Encounter: Payer: Self-pay | Admitting: Physical Therapy

## 2022-11-27 DIAGNOSIS — M79641 Pain in right hand: Secondary | ICD-10-CM | POA: Insufficient documentation

## 2022-11-27 DIAGNOSIS — M542 Cervicalgia: Secondary | ICD-10-CM | POA: Diagnosis present

## 2022-11-27 DIAGNOSIS — M79642 Pain in left hand: Secondary | ICD-10-CM | POA: Diagnosis not present

## 2022-11-27 DIAGNOSIS — R200 Anesthesia of skin: Secondary | ICD-10-CM | POA: Diagnosis not present

## 2022-11-27 DIAGNOSIS — M79622 Pain in left upper arm: Secondary | ICD-10-CM | POA: Insufficient documentation

## 2022-11-27 DIAGNOSIS — M6281 Muscle weakness (generalized): Secondary | ICD-10-CM | POA: Insufficient documentation

## 2022-11-27 DIAGNOSIS — R202 Paresthesia of skin: Secondary | ICD-10-CM | POA: Insufficient documentation

## 2022-11-27 DIAGNOSIS — R252 Cramp and spasm: Secondary | ICD-10-CM | POA: Diagnosis present

## 2022-11-27 DIAGNOSIS — M5010 Cervical disc disorder with radiculopathy, unspecified cervical region: Secondary | ICD-10-CM | POA: Insufficient documentation

## 2022-12-02 ENCOUNTER — Telehealth: Payer: Self-pay | Admitting: Physical Therapy

## 2022-12-02 ENCOUNTER — Ambulatory Visit: Payer: Medicaid Other | Admitting: Physical Therapy

## 2022-12-02 NOTE — Telephone Encounter (Signed)
Contacted patient regarding no show to appointment. Left detailed message regarding scheduled appointments and attendance policy. Requested call back if he needs to make changes to schedule.

## 2022-12-04 ENCOUNTER — Ambulatory Visit: Payer: Medicaid Other | Admitting: Physical Therapy

## 2022-12-04 ENCOUNTER — Encounter: Payer: Self-pay | Admitting: Physical Therapy

## 2022-12-04 DIAGNOSIS — R252 Cramp and spasm: Secondary | ICD-10-CM

## 2022-12-04 DIAGNOSIS — M542 Cervicalgia: Secondary | ICD-10-CM

## 2022-12-04 DIAGNOSIS — M6281 Muscle weakness (generalized): Secondary | ICD-10-CM | POA: Diagnosis not present

## 2022-12-04 NOTE — Therapy (Addendum)
OUTPATIENT PHYSICAL THERAPY CERVICAL TREATMENT/Discharge   Patient Name: Frank Fischer MRN: [id number] DOB:April 12, 1961, 62 y.o., male Today's Date: 12/04/2022  END OF SESSION:  PT End of Session - 12/04/22 0928     Visit Number 2    Number of Visits 12    Date for PT Re-Evaluation 01/08/23    Authorization Type [place] MEDICAID UHC    Authorization - Number of Visits 27    PT Start Time 0930    PT Stop Time 1015    PT Time Calculation (min) 45 min             Past Medical History:  Diagnosis Date   Arthritis    Cervical stenosis of spine    Chronic headaches    Dental caries    periodontal disease   Frequency of urination    History of cerebral infarction 11-28-2017  per pt never had symptoms   per MRI imaging 10-31-2016 2 small chronic lacunar infartions in the right frontal periventricular white matter , and chronic left medial orbital fracture (as seen 2014 MRI)   Hypertension    Noncompliance with medication regimen    Prostate cancer [facility]) urologist-  dr winter/  oncologist-  dr Kathrynn Running   dx 07-21-2017--- Stage T1c, Gleason 4+3,  PSA 13.40,  vol 49.7cc--  scheduled for radioactive seed implants 12-03-2017   Past Surgical History:  Procedure Laterality Date   ABDOMINAL EXPLORATION SURGERY  age 7   per pt had Small Bowel Resection and Appendectomy   APPENDECTOMY     ARM SURGERY Left 1999   REPAIR INJURY-- "ARM CUT ABOUT OFF"   IR CATHETER TUBE CHANGE  10/28/2021   IR GUIDED DRAIN W CATHETER PLACEMENT  09/03/2021   IR US GUIDANCE  09/03/2021   PROSTATE BIOPSY  07/21/2017  dr winter office   RADIOACTIVE SEED IMPLANT N/A 12/03/2017   Procedure: RADIOACTIVE SEED IMPLANT/BRACHYTHERAPY IMPLANT;  Surgeon: Rene Paci, MD;  Location: [facility];  Service: Urology;  Laterality: N/A;  ONLY NEEDS 120 MIN   SPACE OAR INSTILLATION N/A 12/03/2017   Procedure: SPACE OAR INSTILLATION;  Surgeon: Rene Paci, MD;  Location: [facility];  Service: Urology;  Laterality: N/A;   TOOTH EXTRACTION N/A 09/21/2018   Procedure: DENTAL EXTRACTIONS WITH ALVEOLIPLASTY;  Surgeon: Ocie Doyne, DDS;  Location: [facility];  Service: Oral Surgery;  Laterality: N/A;   Patient Active Problem List   Diagnosis Date Noted   History of urinary retention 07/31/2022   Acute CVA (cerebrovascular accident) (HCC) 02/26/2019   Polysubstance abuse (HCC) 02/26/2019   Malignant neoplasm of prostate (HCC) 10/02/2017   Noncompliance with medication regimen 12/18/2016   Chronic tension type headache 10/20/2016   Headache(784.0) 10/19/2013   Itching 10/19/2013   Smoker 10/19/2013   Essential hypertension, benign 07/08/2013   Pain in joint, shoulder region 07/08/2013   Cervical disc disorder with radiculopathy of cervical region 07/08/2013   Dental caries 07/08/2013   Spinal stenosis in cervical region 07/07/2013   Tendinitis of left rotator cuff 07/07/2013    PCP: Richarda Blade NP  REFERRING PROVIDER: Antony Madura, MD  REFERRING DIAG: M50.10 (ICD-10-CM) - Cervical disc disorder with radiculopathy of cervical region M79.641,M79.642 (ICD-10-CM) - Pain in both hands R20.0,R20.2 (ICD-10-CM) - Numbness and tingling in both hands  THERAPY DIAG:  Cervicalgia  Cramp and spasm  Muscle weakness (generalized)  Rationale for Evaluation and Treatment: Rehabilitation  ONSET DATE: chronic   SUBJECTIVE:  SUBJECTIVE STATEMENT: I've been doing the exercises a little. No change in pain. Still have ringing in my ears.     EVAL:The pain has been going on about 1 yr.  My neck cramps up.  My hands hurt all the time and that has been going on 6 yrs or so.  Hands stiff.  When he lays "the wrong way" his neck cramps up.  Pain when turning neck . Spasms  last 5-15 min.  He endorses some vague weakness as well.  Hands are numb and tingling constantly.  L elbow pain.  He has some residual L sided weakness from his stroke.  He is unable to do his recreational activities and home tasks due to pain   He had a cramping episode while sitting edge of mat during the evaluation.   Hand dominance: Right  PERTINENT HISTORY:  L arm trauma, stroke x 2 (2020, right hemisphere), HTN, HLD, COPD, prostate cancer, OA, chronic headaches, and cervical radiculopathy   Neurology note: Patient has had symptoms for a few years. He cut his left arm around 2000. The arm has been numb in the anterior left forearm where he was cut since then. He describes achiness that he says is like squeezing with numbness and tingling. He thinks the left is worse than the right. He sometimes has difficulty picking things up. His symptoms are constant and not worse at night. The symptoms do not wake him up at night. He has had 2 surgeries on his left forearm previous due to ligament damage per patient.    He has a history of cervical radiculopathy and has pain that comes and goes. He gets cramping of the neck. He denies radiating pain. He has had nerve blocks in the past, but this did not change the symptoms in his arm.   He denies any numbness, tingling, or weakness in the legs.    PAIN:  Are you having pain? Yes: NPRS scale: 5/10 Pain location: L neck , head and ear Pain description: cramping  Aggravating factors: can be spontaneous Relieving factors: tries to move it a little  5/10 cramped to 10/10 during session   PRECAUTIONS: None  WEIGHT BEARING RESTRICTIONS: No  FALLS:  Has patient fallen in last 6 months? No he does report some balance issues   LIVING ENVIRONMENT: Lives with: lives with their partner Lives in: House/apartment Stairs: No Has following equipment at home: None  OCCUPATION: not working , disabled   PLOF: Independent  PATIENT GOALS: I want to get  better, pain relief   NEXT MD VISIT: Oct. 2024   OBJECTIVE:   DIAGNOSTIC FINDINGS:   NCV Impression: This is an abnormal study. 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 The findings are mild in degree electrically at right C7 and right C8 levels. The findings are moderate in degree electrically at left C8 level. The findings are moderate to severe at left C7 and left T1 levels. 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 Severity is difficult to grade due to #1 above. No electrodiagnostic evidence of a right or left median mononeuropathy at or distal to the wrist (ie: carpal tunnel syndrome).     PATIENT SURVEYS:  FOTO deferred due to inability to read   COGNITION: Overall cognitive status:  Difficulty assessing in supine due to neck and shoulder tension   SENSATION: Tingling in L arm at times   POSTURE: rounded shoulders, forward head, increased thoracic kyphosis, and guarded   PALPATION: Difficulty relaxing UE and neck during supine assessment    CERVICAL ROM:   Active ROM A/PROM (deg) eval  Flexion 40  Extension 20 P  Right lateral flexion 28  Left lateral flexion 18 P  Right rotation 35  Left rotation 40   (Blank rows = not tested)  UPPER EXTREMITY ROM:  Active ROM Right eval Left eval  Shoulder flexion 120 90  Shoulder extension    Shoulder abduction 120 95  Shoulder adduction    Shoulder extension    Shoulder internal rotation Rt hip R lumbar   Shoulder external rotation    Elbow flexion full full  Elbow extension full Full   Wrist flexion    Wrist extension    Wrist ulnar deviation    Wrist radial deviation    Wrist pronation    Wrist supination     (Blank rows = not tested)  UPPER EXTREMITY  MMT:  MMT Right eval Left eval  Shoulder flexion 4 3+  Shoulder extension    Shoulder abduction 4 3+  Shoulder adduction    Shoulder extension    Shoulder internal rotation    Shoulder external rotation    Middle trapezius    Lower trapezius    Elbow flexion 4 4  Elbow extension 4 3+  Wrist flexion    Wrist extension    Wrist ulnar deviation    Wrist radial deviation    Wrist pronation    Wrist supination    Grip strength 50 lbs 38 lbs    (Blank rows = not tested)  CERVICAL SPECIAL TESTS:  Distraction test: painful   FUNCTIONAL TESTS:  NT   TODAY'S TREATMENT:                                                                                                                              DATE: 11/27/22  [facility] Adult PT Treatment:                                                DATE: 12/04/22 Therapeutic Exercise: Review of all initial HEP exercises Supine chest press and pullovers with dowel  Supine cervical rotation AROM Manual Therapy: Gentle supine paraspinal and suboccipital STW- does not tolerate and difficulty relaxing  Modalities: HMP x 10 min IFC estim x 10 min,posterior neck , 8 mA    [facility] Adult PT Treatment:  DATE: 11/27/22 Therapeutic Exercise: Supine chin tuck Scapular retraction Gentle ROM  Manual Therapy: Assessment of cervical mm, guarded Self Care: HEP, PT/POC, Dry needling, smoking cessation    PATIENT EDUCATION:  Education details: see self care above  Person educated: Patient Education method: Explanation, Demonstration, Verbal cues, and Handouts Education comprehension: verbalized understanding  HOME EXERCISE PROGRAM: Access Code: [email or web address] URL: [email or web address] Date: 11/27/2022 Prepared by: Karie Mainland  Exercises - Standing Cervical Retraction with Sidebending  - 1 x daily - 7 x weekly - 2 sets - 10 reps - 10-15 hold - Standing Cervical Rotation AROM  - 1 x daily - 7 x  weekly - 2 sets - 10 reps - 10-15 hold - Seated Chin Tuck with Neck Elongation  - 1 x daily - 7 x weekly - 2 sets - 10 reps - 5 hold - Supine Cervical Retraction with Towel  - 1 x daily - 7 x weekly - 2 sets - 10 reps - 5 hold - Standing Scapular Retraction  - 1 x daily - 7 x weekly - 2 sets - 10 reps - 5 hold  ASSESSMENT:  CLINICAL IMPRESSION: Patient is a 62 y.o. male who was seen today for physical therapy treatment for cervical radiculopathy. He reports mod compliance with HEP and required cues for correct technique. Progressed with supine shoulder ROM. Attempted cervical STW with pt having difficulty relaxing and poor tolerance to light pressure. Trial of IFC and HMP at end of session to address pain. He reported decreased pain at end of session and may be interested in TENS info at next session.   OBJECTIVE IMPAIRMENTS: decreased activity tolerance, decreased coordination, decreased endurance, decreased knowledge of condition, decreased mobility, difficulty walking, decreased ROM, decreased strength, hypomobility, increased fascial restrictions, increased muscle spasms, impaired flexibility, impaired UE functional use, postural dysfunction, and pain.   ACTIVITY LIMITATIONS: carrying, lifting, sitting, standing, squatting, bed mobility, reach over head, hygiene/grooming, and locomotion level  PARTICIPATION LIMITATIONS: meal prep, cleaning, interpersonal relationship, shopping, community activity, and yard work  PERSONAL FACTORS: Behavior pattern, Past/current experiences, Social background, Time since onset of injury/illness/exacerbation, and 3+ comorbidities: HTN, Prostate CA, OA   are also affecting patient's functional outcome.   REHAB POTENTIAL: Good  CLINICAL DECISION MAKING: Evolving/moderate complexity  EVALUATION COMPLEXITY: Moderate   GOALS: Goals reviewed with patient? Yes   LONG TERM GOALS: Target date: 01/08/2023    Pt will be I with HEP for posture, shoulder and  cervical mobility  Baseline: unknown  Goal status: INITIAL  2.  Pt will be able to report less instances of spasm in cervical mm, < 4 per week, and less intense  Baseline: daily , lasting 10-15 min  Goal status: INITIAL  3.  Pt will be able to increase cervical AROM to 60 deg or more with no pain  Baseline: see above  Goal status: INITIAL  4.  Pt will be able to raise arms overhead without increased neck/shoulder pain  Baseline: L UE limited to about 95 deg  Goal status: INITIAL  5.  Pt will be able to  Baseline:  Goal status: INITIAL   PLAN:  PT FREQUENCY: 2x/week  PT DURATION: 6 weeks  PLANNED INTERVENTIONS: Therapeutic exercises, Therapeutic activity, Neuromuscular re-education, Balance training, Gait training, Patient/Family education, Self Care, Joint mobilization, Dry Needling, Electrical stimulation, Spinal mobilization, Cryotherapy, Moist heat, Taping, Manual therapy, and Re-evaluation  PLAN FOR NEXT SESSION: check HEP, DN to cervicals, Manual/C-ROM , give info on TENS if helpful  Check all possible CPT codes: 16109 - PT Re-evaluation, 97110- Therapeutic Exercise, [phone]- Neuro Re-education, [phone] - Manual Therapy, 97530 - Therapeutic Activities, [phone] - Self Care, [phone] - Electrical stimulation (unattended), and 97750 - Physical performance training    Check all conditions that are expected to impact treatment: {Conditions expected to impact treatment:Musculoskeletal disorders and Social determinants of health   If treatment provided at initial evaluation, no treatment charged due to lack of authorization.     Jannette Spanner, PTA 12/04/22 2:49 PM Phone: [phone] Fax: [phone]   PHYSICAL THERAPY DISCHARGE SUMMARY  Visits from Start of Care: 2  Current functional level related to goals / functional outcomes: Pt self DCed   Remaining deficits: Pt self DCed   Education / Equipment: HEP   Patient agrees to discharge. Patient goals were not met.  Patient is being discharged due to not returning since the last visit.    Allen Ralls MS, PT 12/25/22 2:09 PM

## 2022-12-09 NOTE — Therapy (Deleted)
OUTPATIENT PHYSICAL THERAPY CERVICAL TREATMENT   Patient Name: Frank Fischer MRN: [id number] DOB:20-Sep-1960, 62 y.o., male Today's Date: 12/09/2022  END OF SESSION:    Past Medical History:  Diagnosis Date   Arthritis    Cervical stenosis of spine    Chronic headaches    Dental caries    periodontal disease   Frequency of urination    History of cerebral infarction 11-28-2017  per pt never had symptoms   per MRI imaging 10-31-2016 2 small chronic lacunar infartions in the right frontal periventricular white matter , and chronic left medial orbital fracture (as seen 2014 MRI)   Hypertension    Noncompliance with medication regimen    Prostate cancer [facility]) urologist-  dr winter/  oncologist-  dr Kathrynn Running   dx 07-21-2017--- Stage T1c, Gleason 4+3,  PSA 13.40,  vol 49.7cc--  scheduled for radioactive seed implants 12-03-2017   Past Surgical History:  Procedure Laterality Date   ABDOMINAL EXPLORATION SURGERY  age 29   per pt had Small Bowel Resection and Appendectomy   APPENDECTOMY     ARM SURGERY Left 1999   REPAIR INJURY-- "ARM CUT ABOUT OFF"   IR CATHETER TUBE CHANGE  10/28/2021   IR GUIDED DRAIN W CATHETER PLACEMENT  09/03/2021   IR US GUIDANCE  09/03/2021   PROSTATE BIOPSY  07/21/2017  dr winter office   RADIOACTIVE SEED IMPLANT N/A 12/03/2017   Procedure: RADIOACTIVE SEED IMPLANT/BRACHYTHERAPY IMPLANT;  Surgeon: Rene Paci, MD;  Location: [facility];  Service: Urology;  Laterality: N/A;  ONLY NEEDS 120 MIN   SPACE OAR INSTILLATION N/A 12/03/2017   Procedure: SPACE OAR INSTILLATION;  Surgeon: Rene Paci, MD;  Location: [facility];  Service: Urology;  Laterality: N/A;   TOOTH EXTRACTION N/A 09/21/2018   Procedure: DENTAL EXTRACTIONS WITH ALVEOLIPLASTY;  Surgeon: Ocie Doyne, DDS;  Location: [facility];  Service: Oral Surgery;  Laterality: N/A;   Patient Active Problem List   Diagnosis Date Noted   History of urinary  retention 07/31/2022   Acute CVA (cerebrovascular accident) (HCC) 02/26/2019   Polysubstance abuse (HCC) 02/26/2019   Malignant neoplasm of prostate (HCC) 10/02/2017   Noncompliance with medication regimen 12/18/2016   Chronic tension type headache 10/20/2016   Headache(784.0) 10/19/2013   Itching 10/19/2013   Smoker 10/19/2013   Essential hypertension, benign 07/08/2013   Pain in joint, shoulder region 07/08/2013   Cervical disc disorder with radiculopathy of cervical region 07/08/2013   Dental caries 07/08/2013   Spinal stenosis in cervical region 07/07/2013   Tendinitis of left rotator cuff 07/07/2013    PCP: Richarda Blade NP  REFERRING PROVIDER: Antony Madura, MD  REFERRING DIAG: M50.10 (ICD-10-CM) - Cervical disc disorder with radiculopathy of cervical region M79.641,M79.642 (ICD-10-CM) - Pain in both hands R20.0,R20.2 (ICD-10-CM) - Numbness and tingling in both hands  THERAPY DIAG:  No diagnosis found.  Rationale for Evaluation and Treatment: Rehabilitation  ONSET DATE: chronic   SUBJECTIVE:  SUBJECTIVE STATEMENT: I've been doing the exercises a little. No change in pain. Still have ringing in my ears.     EVAL:The pain has been going on about 1 yr.  My neck cramps up.  My hands hurt all the time and that has been going on 6 yrs or so.  Hands stiff.  When he lays "the wrong way" his neck cramps up.  Pain when turning neck . Spasms last 5-15 min.  He endorses some vague weakness as well.  Hands are numb and tingling constantly.  L elbow pain.  He has some residual L sided weakness from his stroke.  He is unable to do his recreational activities and home tasks due to pain   He had a cramping episode while sitting edge of mat during the evaluation.   Hand dominance:  Right  PERTINENT HISTORY:  L arm trauma, stroke x 2 (2020, right hemisphere), HTN, HLD, COPD, prostate cancer, OA, chronic headaches, and cervical radiculopathy   Neurology note: Patient has had symptoms for a few years. He cut his left arm around 2000. The arm has been numb in the anterior left forearm where he was cut since then. He describes achiness that he says is like squeezing with numbness and tingling. He thinks the left is worse than the right. He sometimes has difficulty picking things up. His symptoms are constant and not worse at night. The symptoms do not wake him up at night. He has had 2 surgeries on his left forearm previous due to ligament damage per patient.    He has a history of cervical radiculopathy and has pain that comes and goes. He gets cramping of the neck. He denies radiating pain. He has had nerve blocks in the past, but this did not change the symptoms in his arm.   He denies any numbness, tingling, or weakness in the legs.    PAIN:  Are you having pain? Yes: NPRS scale: 5/10 Pain location: L neck , head and ear Pain description: cramping  Aggravating factors: can be spontaneous Relieving factors: tries to move it a little  5/10 cramped to 10/10 during session   PRECAUTIONS: None  WEIGHT BEARING RESTRICTIONS: No  FALLS:  Has patient fallen in last 6 months? No he does report some balance issues   LIVING ENVIRONMENT: Lives with: lives with their partner Lives in: House/apartment Stairs: No Has following equipment at home: None  OCCUPATION: not working , disabled   PLOF: Independent  PATIENT GOALS: I want to get better, pain relief   NEXT MD VISIT: Oct. 2024   OBJECTIVE:   DIAGNOSTIC FINDINGS:   NCV Impression: This is an abnormal study. 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 The findings are mild in degree electrically at right  C7 and right C8 levels. The findings are moderate in degree electrically at left C8 level. The findings are moderate to severe at left C7 and left T1 levels. 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 Severity is difficult to grade due to #1 above. No electrodiagnostic evidence of a right or left median mononeuropathy at or distal to the wrist (ie: carpal tunnel syndrome).     PATIENT SURVEYS:  FOTO deferred due to inability to read   COGNITION: Overall cognitive status:  Difficulty assessing in supine due to neck and shoulder tension   SENSATION: Tingling in L arm at times   POSTURE: rounded shoulders, forward head, increased thoracic kyphosis, and guarded   PALPATION: Difficulty relaxing UE and neck during supine assessment    CERVICAL ROM:   Active ROM A/PROM (deg) eval  Flexion 40  Extension 20 P  Right lateral flexion 28  Left lateral flexion 18 P  Right rotation 35  Left rotation 40   (Blank rows = not tested)  UPPER EXTREMITY ROM:  Active ROM Right eval Left eval  Shoulder flexion 120 90  Shoulder extension    Shoulder abduction 120 95  Shoulder adduction    Shoulder extension    Shoulder internal rotation Rt hip R lumbar   Shoulder external rotation    Elbow flexion full full  Elbow extension full Full   Wrist flexion    Wrist extension    Wrist ulnar deviation    Wrist radial deviation    Wrist pronation    Wrist supination     (Blank rows = not tested)  UPPER EXTREMITY MMT:  MMT Right eval Left eval  Shoulder flexion 4 3+  Shoulder extension    Shoulder abduction 4 3+  Shoulder adduction    Shoulder extension    Shoulder internal rotation    Shoulder external rotation    Middle trapezius    Lower trapezius    Elbow flexion 4 4  Elbow extension 4 3+  Wrist flexion    Wrist extension    Wrist ulnar  deviation    Wrist radial deviation    Wrist pronation    Wrist supination    Grip strength 50 lbs 38 lbs    (Blank rows = not tested)  CERVICAL SPECIAL TESTS:  Distraction test: painful   FUNCTIONAL TESTS:  NT   TODAY'S TREATMENT:                                                                                                                              DATE:  [facility] Adult PT Treatment:                                                DATE: 12/10/22 Therapeutic Exercise: *** Manual Therapy: *** Neuromuscular re-ed: *** Therapeutic Activity: *** Modalities: *** Self Care: ***  [facility] Adult PT Treatment:                                                DATE: 12/04/22 Therapeutic Exercise: Review of all initial HEP exercises Supine chest press and pullovers with dowel  Supine cervical rotation AROM Manual Therapy: Gentle supine paraspinal and suboccipital STW-  does not tolerate and difficulty relaxing  Modalities: HMP x 10 min IFC estim x 10 min,posterior neck , 8 mA    [facility] Adult PT Treatment:                                                DATE: 11/27/22 Therapeutic Exercise: Supine chin tuck Scapular retraction Gentle ROM  Manual Therapy: Assessment of cervical mm, guarded Self Care: HEP, PT/POC, Dry needling, smoking cessation    PATIENT EDUCATION:  Education details: see self care above  Person educated: Patient Education method: Explanation, Demonstration, Verbal cues, and Handouts Education comprehension: verbalized understanding  HOME EXERCISE PROGRAM: Access Code: [email or web address] URL: [email or web address] Date: 11/27/2022 Prepared by: Karie Mainland  Exercises - Standing Cervical Retraction with Sidebending  - 1 x daily - 7 x weekly - 2 sets - 10 reps - 10-15 hold - Standing Cervical Rotation AROM  - 1 x daily - 7 x weekly - 2 sets - 10 reps - 10-15 hold - Seated Chin Tuck with Neck Elongation  - 1 x daily - 7 x weekly - 2 sets - 10 reps - 5  hold - Supine Cervical Retraction with Towel  - 1 x daily - 7 x weekly - 2 sets - 10 reps - 5 hold - Standing Scapular Retraction  - 1 x daily - 7 x weekly - 2 sets - 10 reps - 5 hold  ASSESSMENT:  CLINICAL IMPRESSION: Patient is a 62 y.o. male who was seen today for physical therapy treatment for cervical radiculopathy. He reports mod compliance with HEP and required cues for correct technique. Progressed with supine shoulder ROM. Attempted cervical STW with pt having difficulty relaxing and poor tolerance to light pressure. Trial of IFC and HMP at end of session to address pain. He reported decreased pain at end of session and may be interested in TENS info at next session.   OBJECTIVE IMPAIRMENTS: decreased activity tolerance, decreased coordination, decreased endurance, decreased knowledge of condition, decreased mobility, difficulty walking, decreased ROM, decreased strength, hypomobility, increased fascial restrictions, increased muscle spasms, impaired flexibility, impaired UE functional use, postural dysfunction, and pain.   ACTIVITY LIMITATIONS: carrying, lifting, sitting, standing, squatting, bed mobility, reach over head, hygiene/grooming, and locomotion level  PARTICIPATION LIMITATIONS: meal prep, cleaning, interpersonal relationship, shopping, community activity, and yard work  PERSONAL FACTORS: Behavior pattern, Past/current experiences, Social background, Time since onset of injury/illness/exacerbation, and 3+ comorbidities: HTN, Prostate CA, OA   are also affecting patient's functional outcome.   REHAB POTENTIAL: Good  CLINICAL DECISION MAKING: Evolving/moderate complexity  EVALUATION COMPLEXITY: Moderate   GOALS: Goals reviewed with patient? Yes   LONG TERM GOALS: Target date: 01/08/2023    Pt will be I with HEP for posture, shoulder and cervical mobility  Baseline: unknown  Goal status: INITIAL  2.  Pt will be able to report less instances of spasm in cervical mm,  < 4 per week, and less intense  Baseline: daily , lasting 10-15 min  Goal status: INITIAL  3.  Pt will be able to increase cervical AROM to 60 deg or more with no pain  Baseline: see above  Goal status: INITIAL  4.  Pt will be able to raise arms overhead without increased neck/shoulder pain  Baseline: L UE limited to about 95 deg  Goal status: INITIAL  5.  Pt will be able  to  Baseline:  Goal status: INITIAL   PLAN:  PT FREQUENCY: 2x/week  PT DURATION: 6 weeks  PLANNED INTERVENTIONS: Therapeutic exercises, Therapeutic activity, Neuromuscular re-education, Balance training, Gait training, Patient/Family education, Self Care, Joint mobilization, Dry Needling, Electrical stimulation, Spinal mobilization, Cryotherapy, Moist heat, Taping, Manual therapy, and Re-evaluation  PLAN FOR NEXT SESSION: check HEP, DN to cervicals, Manual/C-ROM , give info on TENS if helpful      Check all possible CPT codes: 52841 - PT Re-evaluation, 97110- Therapeutic Exercise, [phone]- Neuro Re-education, 97140 - Manual Therapy, 97530 - Therapeutic Activities, 97535 - Self Care, 97014 - Electrical stimulation (unattended), and 97750 - Physical performance training    Check all conditions that are expected to impact treatment: {Conditions expected to impact treatment:Musculoskeletal disorders and Social determinants of health   If treatment provided at initial evaluation, no treatment charged due to lack of authorization.     Jannette Spanner, PTA 12/09/22 1:57 PM Phone: [phone] Fax: [phone]

## 2022-12-10 ENCOUNTER — Encounter: Payer: Self-pay | Admitting: Physical Therapy

## 2022-12-10 ENCOUNTER — Telehealth: Payer: Self-pay | Admitting: Physical Therapy

## 2022-12-10 ENCOUNTER — Ambulatory Visit: Payer: Medicaid Other | Admitting: Physical Therapy

## 2022-12-10 NOTE — Telephone Encounter (Signed)
Patient no-showed today's appointment. Called and left voicemail regarding his 2nd no show.  I will keep his next appt. but from there he will need to schedule 1 at a time per our attendance policy.  Reviewed policy and asked if he cannot come to please call and let us know.  Karie Mainland, PT 12/10/22 10:52 AM Phone: [phone] Fax: [phone]

## 2022-12-12 ENCOUNTER — Ambulatory Visit: Payer: Medicaid Other

## 2022-12-19 ENCOUNTER — Ambulatory Visit: Payer: Medicaid Other | Admitting: Physical Therapy

## 2022-12-20 ENCOUNTER — Ambulatory Visit: Payer: Medicaid Other

## 2022-12-25 ENCOUNTER — Ambulatory Visit: Payer: Medicaid Other | Attending: Neurology

## 2022-12-25 ENCOUNTER — Telehealth: Payer: Self-pay

## 2022-12-25 NOTE — Telephone Encounter (Signed)
Attempted to contact by phone. There was no answer and waa unable to leave a message. This is the pt's 3rd no show and he is Dced from PT services at this time.

## 2022-12-26 ENCOUNTER — Ambulatory Visit: Payer: Medicaid Other | Admitting: Physical Therapy

## 2022-12-31 ENCOUNTER — Ambulatory Visit: Payer: Medicaid Other | Admitting: Urology

## 2022-12-31 NOTE — Progress Notes (Deleted)
Assessment: 1. Malignant neoplasm of prostate (HCC): GG 3; s/p LDR brachytherapy 7/19   2. History of urinary retention     Plan: PSA today Continue tamsulosin 0.4 mg daily for LUTS. Return to office in August 2024 with PSA f Chief Complaint:  No chief complaint on file.   History of Present Illness:  Frank Fischer is a 62 y.o. male who is seen for further evaluation of prostate cancer. He has a history of prostate cancer, diagnosed in March 2019.  PSA was 13.4.  Path showed Gleason 4+3 adenocarcinoma.  He underwent brachytherapy and SpaceOAR in July 2019 by Dr. Liliane Shi.   Post treatment PSA:   11/19 2.11 1/23 1.1 2/24 0.07  He has a history of urinary retention requiring SP tube placement in 2023.  His SP tube was removed in 8/23.   He has been voiding spontaneously following SP tube removal.  He reported symptoms of urinary urgency and dysuria.  No frequency or nocturia.  No gross hematuria.  He was not taking any medications for his urinary symptoms. Urine culture from 3/24 grew >100 K Klebsiella.  He was treated with Bactrim x 7 days. He was restarted on tamsulosin for his lower urinary tract symptoms.  Portions of the above documentation were copied from a prior visit for review purposes only.    Past Medical History:  Past Medical History:  Diagnosis Date   Arthritis    Cervical stenosis of spine    Chronic headaches    Dental caries    periodontal disease   Frequency of urination    History of cerebral infarction 11-28-2017  per pt never had symptoms   per MRI imaging 10-31-2016 2 small chronic lacunar infartions in the right frontal periventricular white matter , and chronic left medial orbital fracture (as seen 2014 MRI)   Hypertension    Noncompliance with medication regimen    Prostate cancer [facility]) urologist-  dr winter/  oncologist-  dr Kathrynn Running   dx 07-21-2017--- Stage T1c, Gleason 4+3,  PSA 13.40,  vol 49.7cc--  scheduled for radioactive seed implants  12-03-2017    Past Surgical History:  Past Surgical History:  Procedure Laterality Date   ABDOMINAL EXPLORATION SURGERY  age 43   per pt had Small Bowel Resection and Appendectomy   APPENDECTOMY     ARM SURGERY Left 1999   REPAIR INJURY-- "ARM CUT ABOUT OFF"   IR CATHETER TUBE CHANGE  10/28/2021   IR GUIDED DRAIN W CATHETER PLACEMENT  09/03/2021   IR US GUIDANCE  09/03/2021   PROSTATE BIOPSY  07/21/2017  dr winter office   RADIOACTIVE SEED IMPLANT N/A 12/03/2017   Procedure: RADIOACTIVE SEED IMPLANT/BRACHYTHERAPY IMPLANT;  Surgeon: Rene Paci, MD;  Location: [facility];  Service: Urology;  Laterality: N/A;  ONLY NEEDS 120 MIN   SPACE OAR INSTILLATION N/A 12/03/2017   Procedure: SPACE OAR INSTILLATION;  Surgeon: Rene Paci, MD;  Location: [facility];  Service: Urology;  Laterality: N/A;   TOOTH EXTRACTION N/A 09/21/2018   Procedure: DENTAL EXTRACTIONS WITH ALVEOLIPLASTY;  Surgeon: Ocie Doyne, DDS;  Location: [facility];  Service: Oral Surgery;  Laterality: N/A;    Allergies:  Allergies  Allergen Reactions   Penicillins Itching    Did it involve swelling of the face/tongue/throat, SOB, or low BP? No Did it involve sudden or severe rash/hives, skin peeling, or any reaction on the inside of your mouth or nose? Yes Did you need to seek medical attention  at a hospital or doctor's office? No When did it last happen?      09/2018 If all above answers are "NO", may proceed with cephalosporin use.    Family History:  Family History  Problem Relation Age of Onset   Lung cancer Mother    Hypertension Brother    Diabetes Paternal Grandfather    Prostate cancer Cousin     Social History:  Social History   Tobacco Use   Smoking status: Every Day    Current packs/day: 0.25    Average packs/day: 0.3 packs/day for 40.0 years (10.0 ttl pk-yrs)    Types: Cigarettes   Smokeless tobacco: Never  Vaping Use   Vaping status: Never Used   Substance Use Topics   Alcohol use: Yes    Alcohol/week: 21.0 standard drinks of alcohol    Types: 21 Cans of beer per week    Comment: 1-2 12oz  beers every evening   Drug use: Yes    Types: Marijuana    Comment:  per pt uses once week    ROS: Constitutional:  Negative for fever, chills, weight loss CV: Negative for chest pain, previous MI, hypertension Respiratory:  Negative for shortness of breath, wheezing, sleep apnea, frequent cough GI:  Negative for nausea, vomiting, bloody stool, GERD  Physical exam: There were no vitals taken for this visit. GENERAL APPEARANCE:  Well appearing, well developed, well nourished, NAD HEENT:  Atraumatic, normocephalic, oropharynx clear NECK:  Supple without lymphadenopathy or thyromegaly ABDOMEN:  Soft, non-tender, no masses EXTREMITIES:  Moves all extremities well, without clubbing, cyanosis, or edema NEUROLOGIC:  Alert and oriented x 3, normal gait, CN II-XII grossly intact MENTAL STATUS:  appropriate BACK:  Non-tender to palpation, No CVAT SKIN:  Warm, dry, and intact  Results: U/A:

## 2023-01-09 ENCOUNTER — Other Ambulatory Visit: Payer: Self-pay | Admitting: Adult Health

## 2023-01-09 DIAGNOSIS — I10 Essential (primary) hypertension: Secondary | ICD-10-CM

## 2023-03-07 ENCOUNTER — Other Ambulatory Visit: Payer: Self-pay | Admitting: Family

## 2023-03-07 ENCOUNTER — Other Ambulatory Visit: Payer: Medicaid Other

## 2023-03-07 DIAGNOSIS — R7303 Prediabetes: Secondary | ICD-10-CM

## 2023-03-07 DIAGNOSIS — I10 Essential (primary) hypertension: Secondary | ICD-10-CM

## 2023-03-07 DIAGNOSIS — R5382 Chronic fatigue, unspecified: Secondary | ICD-10-CM

## 2023-03-07 DIAGNOSIS — E785 Hyperlipidemia, unspecified: Secondary | ICD-10-CM

## 2023-03-07 DIAGNOSIS — J449 Chronic obstructive pulmonary disease, unspecified: Secondary | ICD-10-CM

## 2023-03-07 NOTE — Progress Notes (Signed)
Labs ordered.

## 2023-03-08 LAB — HEMOGLOBIN A1C
Hgb A1c MFr Bld: 6.3 %{Hb} — ABNORMAL HIGH (ref <5.7)
Mean Plasma Glucose: 134 mg/dL
eAG (mmol/L): 7.4 mmol/L

## 2023-03-08 LAB — LIPID PANEL
Cholesterol: 195 mg/dL (ref <200)
HDL: 49 mg/dL (ref >=40)
LDL Cholesterol (Calc): 120 mg/dL — ABNORMAL HIGH
Non-HDL Cholesterol (Calc): 146 mg/dL — ABNORMAL HIGH (ref <130)
Total CHOL/HDL Ratio: 4 (calc) (ref <5.0)
Triglycerides: 151 mg/dL — ABNORMAL HIGH (ref <150)

## 2023-03-08 LAB — COMPLETE METABOLIC PANEL WITH GFR
AG Ratio: 1.7 (calc) (ref 1.0–2.5)
ALT: 28 U/L (ref 9–46)
AST: 26 U/L (ref 10–35)
Albumin: 4.5 g/dL (ref 3.6–5.1)
Alkaline phosphatase (APISO): 103 U/L (ref 35–144)
BUN: 11 mg/dL (ref 7–25)
CO2: 22 mmol/L (ref 20–32)
Calcium: 9.4 mg/dL (ref 8.6–10.3)
Chloride: 106 mmol/L (ref 98–110)
Creat: 0.88 mg/dL (ref 0.70–1.35)
Globulin: 2.7 g/dL (ref 1.9–3.7)
Glucose, Bld: 90 mg/dL (ref 65–99)
Potassium: 4.2 mmol/L (ref 3.5–5.3)
Sodium: 139 mmol/L (ref 135–146)
Total Bilirubin: 0.4 mg/dL (ref 0.2–1.2)
Total Protein: 7.2 g/dL (ref 6.1–8.1)
eGFR: 96 mL/min/{1.73_m2} (ref >=60)

## 2023-03-08 LAB — CBC WITH DIFFERENTIAL/PLATELET
Absolute Lymphocytes: 1800 {cells}/uL (ref 850–3900)
Absolute Monocytes: 495 {cells}/uL (ref 200–950)
Basophils Absolute: 20 {cells}/uL (ref 0–200)
Basophils Relative: 0.4 %
Eosinophils Absolute: 30 {cells}/uL (ref 15–500)
Eosinophils Relative: 0.6 %
HCT: 44.6 % (ref 38.5–50.0)
Hemoglobin: 15.5 g/dL (ref 13.2–17.1)
MCH: 32.8 pg (ref 27.0–33.0)
MCHC: 34.8 g/dL (ref 32.0–36.0)
MCV: 94.3 fL (ref 80.0–100.0)
MPV: 10.6 fL (ref 7.5–12.5)
Monocytes Relative: 9.9 %
Neutro Abs: 2655 {cells}/uL (ref 1500–7800)
Neutrophils Relative %: 53.1 %
Platelets: 286 10*3/uL (ref 140–400)
RBC: 4.73 10*6/uL (ref 4.20–5.80)
RDW: 14.2 % (ref 11.0–15.0)
Total Lymphocyte: 36 %
WBC: 5 10*3/uL (ref 3.8–10.8)

## 2023-03-08 LAB — TSH: TSH: 2.62 m[IU]/L (ref 0.40–4.50)

## 2023-03-12 ENCOUNTER — Ambulatory Visit: Payer: Medicaid Other | Admitting: Family

## 2023-03-12 ENCOUNTER — Encounter: Payer: Self-pay | Admitting: Family

## 2023-03-12 VITALS — BP 128/76 | HR 68 | Temp 97.3°F | Resp 20 | Ht 70.0 in | Wt 209.0 lb

## 2023-03-12 DIAGNOSIS — R079 Chest pain, unspecified: Secondary | ICD-10-CM

## 2023-03-12 DIAGNOSIS — I10 Essential (primary) hypertension: Secondary | ICD-10-CM | POA: Diagnosis not present

## 2023-03-12 DIAGNOSIS — J449 Chronic obstructive pulmonary disease, unspecified: Secondary | ICD-10-CM

## 2023-03-12 DIAGNOSIS — R059 Cough, unspecified: Secondary | ICD-10-CM

## 2023-03-12 DIAGNOSIS — E785 Hyperlipidemia, unspecified: Secondary | ICD-10-CM | POA: Diagnosis not present

## 2023-03-12 DIAGNOSIS — L602 Onychogryphosis: Secondary | ICD-10-CM

## 2023-03-12 DIAGNOSIS — F17209 Nicotine dependence, unspecified, with unspecified nicotine-induced disorders: Secondary | ICD-10-CM

## 2023-03-12 DIAGNOSIS — R7303 Prediabetes: Secondary | ICD-10-CM

## 2023-03-12 NOTE — Patient Instructions (Signed)
-   Please get chest X-ray at [facility] at [address] then will call you with results. ? ?

## 2023-03-12 NOTE — Progress Notes (Signed)
Provider: Richarda Blade FNP-C   Frank Fischer, Frank Citrin, NP  Patient Care Team: Frank Fischer, Frank Citrin, NP as PCP - General (Family Medicine) Frank Fret, MD as Consulting Physician (Neurology) Frank Paci, MD as Consulting Physician (Urology) Frank Dys, MD as Consulting Physician (Radiation Oncology) Frank Fischer as Social Worker  Extended Emergency Contact Information Primary Emergency Contact: Frank Fischer, [name] Home Phone: [phone] Relation: Brother  Code Status:  Full Code  Goals of care: Advanced Directive information    03/12/2023   10:07 AM  Advanced Directives  Does Patient Have a Medical Advance Directive? No  Would patient like information on creating a medical advance directive? No - Patient declined     Chief Complaint  Patient presents with   Medical Management of Chronic Issues    6 month follow-up patient wants to discuss pain around his heart    HPI:  Pt is a 62 y.o. male seen today for 6 months for follow up medical management of chronic diseases.  Recent lab results reviewed and discussed during the visit.   He complains of left side chest pain worst sometimes than other times.states worst deep breathing and coughing. No radiation.    Smokes 10 cigarettes per day. Prescribed Wellbutrin twice daily but has been taking it once daily.   Hypertension - checks B/p every now and then.did not bring log to visit. He denies any headache,dizziness,vision changes,fatigue,chest tightness,palpitation, or shortness of breath.Has not take any medication this visit due to fasting.     Previous 212 lbs down to 209 lbs. Eats two meals per day.    States was send Cologuard but has not returned kit.   Drinks beer daily some days worst than others.   Past Medical History:  Diagnosis Date   Arthritis    Cervical stenosis of spine    Chronic headaches    Dental caries    periodontal  disease   Frequency of urination    History of cerebral infarction 11-28-2017  per pt never had symptoms   per MRI imaging 10-31-2016 2 small chronic lacunar infartions in the right frontal periventricular white matter , and chronic left medial orbital fracture (as seen 2014 MRI)   Hypertension    Noncompliance with medication regimen    Prostate cancer [facility]) urologist-  dr winter/  oncologist-  dr Kathrynn Running   dx 07-21-2017--- Stage T1c, Gleason 4+3,  PSA 13.40,  vol 49.7cc--  scheduled for radioactive seed implants 12-03-2017   Past Surgical History:  Procedure Laterality Date   ABDOMINAL EXPLORATION SURGERY  age 61   per pt had Small Bowel Resection and Appendectomy   APPENDECTOMY     ARM SURGERY Left 1999   REPAIR INJURY-- "ARM CUT ABOUT OFF"   IR CATHETER TUBE CHANGE  10/28/2021   IR GUIDED DRAIN W CATHETER PLACEMENT  09/03/2021   IR US GUIDANCE  09/03/2021   PROSTATE BIOPSY  07/21/2017  dr winter office   RADIOACTIVE SEED IMPLANT N/A 12/03/2017   Procedure: RADIOACTIVE SEED IMPLANT/BRACHYTHERAPY IMPLANT;  Surgeon: Frank Paci, MD;  Location: [facility];  Service: Urology;  Laterality: N/A;  ONLY NEEDS 120 MIN   SPACE OAR INSTILLATION N/A 12/03/2017   Procedure: SPACE OAR INSTILLATION;  Surgeon: Frank Paci, MD;  Location: [facility];  Service: Urology;  Laterality: N/A;   TOOTH EXTRACTION N/A 09/21/2018   Procedure: DENTAL EXTRACTIONS WITH ALVEOLIPLASTY;  Surgeon: Ocie Doyne, DDS;  Location: [facility] OR;  Service: Oral Surgery;  Laterality: N/A;    Allergies  Allergen Reactions   Penicillins Itching    Did it involve swelling of the face/tongue/throat, SOB, or low BP? No Did it involve sudden or severe rash/hives, skin peeling, or any reaction on the inside of your mouth or nose? Yes Did you need to seek medical attention at a hospital or doctor's office? No When did it last happen?      09/2018 If all above answers are "NO", may  proceed with cephalosporin use.    Allergies as of 03/12/2023       Reactions   Penicillins Itching   Did it involve swelling of the face/tongue/throat, SOB, or low BP? No Did it involve sudden or severe rash/hives, skin peeling, or any reaction on the inside of your mouth or nose? Yes Did you need to seek medical attention at a hospital or doctor's office? No When did it last happen?      09/2018 If all above answers are "NO", may proceed with cephalosporin use.        Medication List        Accurate as of March 12, 2023 10:58 AM. If you have any questions, ask your nurse or doctor.          albuterol 108 (90 Base) MCG/ACT inhaler Commonly known as: VENTOLIN HFA Inhale 2 puffs into the lungs every 6 (six) hours as needed for wheezing or shortness of breath.   amLODipine 10 MG tablet Commonly known as: NORVASC Take 1 tablet (10 mg total) by mouth daily.   ASPIRIN 81 PO Take 81 mg by mouth.   atorvastatin 80 MG tablet Commonly known as: LIPITOR TAKE 1 Tablet BY MOUTH ONCE DAILY AT 6pm   buPROPion 150 MG 12 hr tablet Commonly known as: WELLBUTRIN SR Take 1 tablet (150 mg total) by mouth 2 (two) times daily.   lisinopril 40 MG tablet Commonly known as: ZESTRIL TAKE 1 Tablet BY MOUTH ONCE DAILY IN THE MORNING   tamsulosin 0.4 MG Caps capsule Commonly known as: FLOMAX Take 1 capsule (0.4 mg total) by mouth daily.        Review of Systems  Constitutional:  Negative for appetite change, chills, fatigue, fever and unexpected weight change.  HENT:  Negative for congestion, dental problem, ear discharge, ear pain, facial swelling, hearing loss, nosebleeds, postnasal drip, rhinorrhea, sinus pressure, sinus pain, sneezing, sore throat, tinnitus and trouble swallowing.   Eyes:  Negative for pain, discharge, redness, itching and visual disturbance.  Respiratory:  Positive for cough. Negative for chest tightness, shortness of breath and wheezing.   Cardiovascular:   Negative for chest pain, palpitations and leg swelling.  Gastrointestinal:  Negative for abdominal distention, abdominal pain, blood in stool, constipation, diarrhea, nausea and vomiting.  Endocrine: Negative for cold intolerance, heat intolerance, polydipsia, polyphagia and polyuria.  Genitourinary:  Negative for difficulty urinating, dysuria, flank pain, frequency and urgency.  Musculoskeletal:  Positive for arthralgias. Negative for back pain, gait problem, joint swelling, myalgias, neck pain and neck stiffness.       Right hip pain   Skin:  Negative for color change, pallor, rash and wound.  Neurological:  Negative for dizziness, syncope, speech difficulty, weakness, light-headedness, numbness and headaches.  Hematological:  Does not bruise/bleed easily.  Psychiatric/Behavioral:  Negative for agitation, behavioral problems, confusion, hallucinations, self-injury, sleep disturbance and suicidal ideas. The patient is not nervous/anxious.     Immunization History  Administered Date(s) Administered   Tdap 07/30/2022   Pertinent  Health Maintenance Due  Topic Date Due   Colonoscopy  Never done   INFLUENZA VACCINE  08/18/2023 (Originally 12/19/2022)      09/03/2021    7:32 PM 10/28/2021    1:16 AM 08/06/2022    9:26 AM 09/12/2022   10:51 AM 03/12/2023   10:06 AM  Fall Risk  Falls in the past year?   0 0 1  Was there an injury with Fall?   0 0 0  Fall Risk Category Calculator   0 0 1  (RETIRED) Patient Fall Risk Level Low fall risk Low fall risk     Patient at Risk for Falls Due to   No Fall Risks  History of fall(s)  Fall risk Follow up   Falls evaluation completed Falls evaluation completed    Functional Status Survey:    Vitals:   03/12/23 1008  BP: 128/76  Pulse: 68  Resp: 20  Temp: (!) 97.3 F (36.3 C)  SpO2: 97%  Weight: 209 lb (94.8 kg)  Height: 5\' 10"  (1.778 m)   Body mass index is 29.99 kg/m. Physical Exam Vitals reviewed.  Constitutional:      General: He is  not in acute distress.    Appearance: Normal appearance. He is overweight. He is not ill-appearing or diaphoretic.  HENT:     Head: Normocephalic.     Right Ear: Tympanic membrane, ear canal and external ear normal. There is no impacted cerumen.     Left Ear: Tympanic membrane, ear canal and external ear normal. There is no impacted cerumen.     Nose: Nose normal. No congestion or rhinorrhea.     Mouth/Throat:     Mouth: Mucous membranes are moist.     Pharynx: Oropharynx is clear. No oropharyngeal exudate or posterior oropharyngeal erythema.  Eyes:     General: No scleral icterus.       Right eye: No discharge.        Left eye: No discharge.     Extraocular Movements: Extraocular movements intact.     Conjunctiva/sclera: Conjunctivae normal.     Pupils: Pupils are equal, round, and reactive to light.  Neck:     Vascular: No carotid bruit.  Cardiovascular:     Rate and Rhythm: Normal rate and regular rhythm.     Pulses: Normal pulses.     Heart sounds: Normal heart sounds. No murmur heard.    No friction rub. No gallop.  Pulmonary:     Effort: Pulmonary effort is normal. No respiratory distress.     Breath sounds: Examination of the left-middle field reveals rales. Examination of the right-lower field reveals rales. Examination of the left-lower field reveals rales. Decreased breath sounds and rales present. No wheezing or rhonchi.  Chest:     Chest wall: No tenderness.  Abdominal:     General: Bowel sounds are normal. There is no distension.     Palpations: Abdomen is soft. There is no mass.     Tenderness: There is no abdominal tenderness. There is no right CVA tenderness, left CVA tenderness, guarding or rebound.  Musculoskeletal:        General: No swelling or tenderness. Normal range of motion.     Cervical back: Normal range of motion. No rigidity or tenderness.     Right lower leg: No edema.     Left lower leg: No edema.  Feet:     Right foot:  Skin integrity: Skin  integrity normal.     Toenail Condition: Right toenails are abnormally thick and long.     Left foot:     Skin integrity: Skin integrity normal.     Toenail Condition: Left toenails are abnormally thick and long.  Lymphadenopathy:     Cervical: No cervical adenopathy.  Skin:    General: Skin is warm and dry.     Coloration: Skin is not pale.     Findings: No bruising, erythema, lesion or rash.  Neurological:     Mental Status: He is alert and oriented to person, place, and time.     Cranial Nerves: No cranial nerve deficit.     Sensory: No sensory deficit.     Motor: No weakness.     Coordination: Coordination normal.     Gait: Gait normal.  Psychiatric:        Mood and Affect: Mood normal.        Speech: Speech normal.        Behavior: Behavior normal.        Thought Content: Thought content normal.        Judgment: Judgment normal.     Labs reviewed: Recent Labs    07/09/22 1349 03/07/23 0901  NA 142 139  K 4.1 4.2  CL 107 106  CO2 26 22  GLUCOSE 101* 90  BUN 14 11  CREATININE 1.32 0.88  CALCIUM 9.5 9.4   Recent Labs    07/09/22 1349 03/07/23 0901  AST 21 26  ALT 18 28  BILITOT 0.3 0.4  PROT 6.8 7.2   Recent Labs    07/09/22 1349 03/07/23 0901  WBC 5.5 5.0  NEUTROABS 3,042 2,655  HGB 15.1 15.5  HCT 43.1 44.6  MCV 94.3 94.3  PLT 283 286   Lab Results  Component Value Date   TSH 2.62 03/07/2023   Lab Results  Component Value Date   HGBA1C 6.3 (H) 03/07/2023   Lab Results  Component Value Date   CHOL 195 03/07/2023   HDL 49 03/07/2023   LDLCALC 120 (H) 03/07/2023   TRIG 151 (H) 03/07/2023   CHOLHDL 4.0 03/07/2023    Significant Diagnostic Results in last 30 days:  No results found.  Assessment/Plan 1. Chest pain, unspecified type left side chest pain worst sometimes than other times.states worst deep breathing and coughing. - EKG 12-Lead  2. Essential hypertension, benign Blood pressure well-controlled -Continue on lisinopril and  amlodipine - COMPLETE METABOLIC PANEL WITH GFR; Future - CBC with Differential/Platelet; Future  3. Hyperlipidemia, unspecified hyperlipidemia type Previous LDL not at goal -Dietary modification and exercise advised -Continue on atorvastatin - Lipid panel; Future  4. Chronic obstructive pulmonary disease, unspecified COPD type (HCC) Breathing stable -Continue on albuterol  5. Prediabetes Lab Results  Component Value Date   HGBA1C 6.3 (H) 03/07/2023  -Dietary modification and exercise advised - Hemoglobin A1c; Future  6. Tobacco use disorder, continuous Smoking cessation advised.  Was advised to take Wellbutrin twice daily but has been taking once a day. -Advised to increase Wellbutrin to twice daily  7. Cough, unspecified type Diminished bases noted.  Will obtain chest x-ray to rule out acute abnormalities - DG Chest 2 View; Future  8. Overgrown toenails Long thick toenails noted.Will refer to the podiatrist to trim toenails - Ambulatory referral to Podiatry  Family/ staff Communication: Reviewed plan of care with patient verbalized understanding  Labs/tests ordered:  - DG Chest 2 View; Future  Next Appointment :  Return in about 3 months (around 06/12/2023) for medical mangement of chronic issues., fasting labs prior to visit.   Caesar Bookman, NP

## 2023-04-02 ENCOUNTER — Ambulatory Visit: Payer: Medicaid Other | Admitting: Podiatry

## 2023-04-04 ENCOUNTER — Other Ambulatory Visit: Payer: Self-pay | Admitting: Family

## 2023-04-04 DIAGNOSIS — I10 Essential (primary) hypertension: Secondary | ICD-10-CM

## 2023-04-04 DIAGNOSIS — F17209 Nicotine dependence, unspecified, with unspecified nicotine-induced disorders: Secondary | ICD-10-CM

## 2023-04-15 ENCOUNTER — Ambulatory Visit: Payer: Medicaid Other | Admitting: Podiatry

## 2023-04-25 NOTE — Progress Notes (Deleted)
I saw ETHANALEXANDER WIDMARK in neurology clinic on 05/07/23 in follow up for bilateral hand pain, numbness, and tingling.  HPI: LOWELL ORDOYNE is a 62 y.o. year old right-handed male with a medical history of stroke (2020, right hemisphere), HTN, HLD, COPD, prostate cancer, OA, chronic headaches, and cervical radiculopathy who we last saw on 09/12/22.  To briefly review: Patient has had symptoms for a few years. He cut his left arm around 2000. The arm has been numb in the anterior left forearm where he was cut since then. He describes achiness that he says is like squeezing with numbness and tingling. He thinks the left is worse than the right. He sometimes has difficulty picking things up. His symptoms are constant and not worse at night. The symptoms do not wake him up at night. He has had 2 surgeries on his left forearm previous due to ligament damage per patient.    He has a history of cervical radiculopathy and has pain that comes and goes. He gets cramping of the neck. He denies radiating pain. He has had nerve blocks in the past, but this did not change the symptoms in his arm.   He denies any numbness, tingling, or weakness in the legs.   He does not report any constitutional symptoms like fever, night sweats, anorexia or unintentional weight loss.   EtOH use: 1-2 beers per day  Restrictive diet? No Family history of neuropathy/myopathy/NM disease? Brother with back and neck spine surgeries  Most recent Assessment and Plan (09/12/22): VIVIN FLY is a 62 y.o. male who presents for evaluation of bilateral hand pain, numbness, and tingling. He has a relevant medical history of stroke (2020, right hemisphere), HTN, HLD, COPD, prostate cancer, OA, chronic headaches, and cervical radiculopathy. His neurological examination is pertinent for residual left sided weakness and numbness and positive Tinel's test at right carpal tunnel. His left arm is chronic numb since cutting his left forearm.  Available diagnostic data is significant for MRI brain in 2020 showed multifocal acute right hemispheric stroke. 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    Patient's current hand pain, numbness, and tingling could be the result of carpal tunnel, cervical radiculopathy, and possible nerve damage on left from previous injury to left forearm. I will get an EMG to better characterize.   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 There were no active changes. I recommended MRI cervical spine. This has not been completed.***  He no showed to PT x3 so was discharged from PT.***  LDL very high - taking atorvastatin 80 mg? Asa 81mg  ?  ROS: Pertinent positive and negative systems reviewed in HPI. ***   MEDICATIONS:  Outpatient Encounter Medications as of 05/07/2023  Medication Sig   albuterol (VENTOLIN HFA) 108 (90 Base) MCG/ACT inhaler Inhale 2 puffs into the lungs every 6 (six) hours as needed for wheezing or shortness of breath.   amLODipine (NORVASC) 10 MG tablet TAKE 1 Tablet BY MOUTH ONCE DAILY   ASPIRIN 81 PO Take 81 mg by mouth.   atorvastatin (  LIPITOR) 80 MG tablet TAKE 1 Tablet BY MOUTH ONCE DAILY AT 6pm   buPROPion (WELLBUTRIN SR) 150 MG 12 hr tablet TAKE 1 Tablet BY MOUTH TWICE DAILY   lisinopril (ZESTRIL) 40 MG tablet TAKE 1 Tablet BY MOUTH ONCE DAILY IN THE MORNING   tamsulosin (FLOMAX) 0.4 MG CAPS capsule Take 1 capsule (0.4 mg total) by mouth daily.   No facility-administered encounter medications on file as of  05/07/2023.    PAST MEDICAL HISTORY: Past Medical History:  Diagnosis Date   Arthritis    Cervical stenosis of spine    Chronic headaches    Dental caries    periodontal disease   Frequency of urination    History of cerebral infarction 11-28-2017  per pt never had symptoms   per MRI imaging 10-31-2016 2 small chronic lacunar infartions in the right frontal periventricular white matter , and chronic left medial orbital fracture (as seen 2014 MRI)   Hypertension    Noncompliance with medication regimen    Prostate cancer [facility]) urologist-  dr winter/  oncologist-  dr Kathrynn Running   dx 07-21-2017--- Stage T1c, Gleason 4+3,  PSA 13.40,  vol 49.7cc--  scheduled for radioactive seed implants 12-03-2017    PAST SURGICAL HISTORY: Past Surgical History:  Procedure Laterality Date   ABDOMINAL EXPLORATION SURGERY  age 29   per pt had Small Bowel Resection and Appendectomy   APPENDECTOMY     ARM SURGERY Left 1999   REPAIR INJURY-- "ARM CUT ABOUT OFF"   IR CATHETER TUBE CHANGE  10/28/2021   IR GUIDED DRAIN W CATHETER PLACEMENT  09/03/2021   IR US GUIDANCE  09/03/2021   PROSTATE BIOPSY  07/21/2017  dr winter office   RADIOACTIVE SEED IMPLANT N/A 12/03/2017   Procedure: RADIOACTIVE SEED IMPLANT/BRACHYTHERAPY IMPLANT;  Surgeon: Rene Paci, MD;  Location: [facility];  Service: Urology;  Laterality: N/A;  ONLY NEEDS 120 MIN   SPACE OAR INSTILLATION N/A 12/03/2017   Procedure: SPACE OAR INSTILLATION;  Surgeon: Rene Paci, MD;  Location: [facility];  Service: Urology;  Laterality: N/A;   TOOTH EXTRACTION N/A 09/21/2018   Procedure: DENTAL EXTRACTIONS WITH ALVEOLIPLASTY;  Surgeon: Ocie Doyne, DDS;  Location: [facility];  Service: Oral Surgery;  Laterality: N/A;    ALLERGIES: Allergies  Allergen Reactions   Penicillins Itching    Did it involve swelling of the face/tongue/throat, SOB, or low BP? No Did it involve sudden or severe rash/hives, skin  peeling, or any reaction on the inside of your mouth or nose? Yes Did you need to seek medical attention at a hospital or doctor's office? No When did it last happen?      09/2018 If all above answers are "NO", may proceed with cephalosporin use.    FAMILY HISTORY: Family History  Problem Relation Age of Onset   Lung cancer Mother    Hypertension Brother    Diabetes Paternal Grandfather    Prostate cancer Cousin     SOCIAL HISTORY: Social History   Tobacco Use   Smoking status: Every Day    Current packs/day: 0.25    Average packs/day: 0.3 packs/day for 40.0 years (10.0 ttl pk-yrs)    Types: Cigarettes   Smokeless tobacco: Never  Vaping Use   Vaping status: Never Used  Substance Use Topics   Alcohol use: Yes    Alcohol/week: 21.0 standard drinks of alcohol    Types: 21 Cans of beer per week    Comment: 1-2 12oz  beers every evening   Drug use: Yes    Types: Marijuana    Comment:  per pt uses once week   Social History   Social History Narrative   Lives at home alone   Right-handed   Caffeine: tea throughout the day   03/04/19 Mr RASHOD KLASE is a 62 year old male who is single living at home alone in his apartment. He voices his support system is his fiance, Baldo Daub and brother Theoren Schramm. He denies issues with transportation to medical appointments. He is independent in his care needs   disablity    Objective:  Vital Signs:  There were no vitals taken for this visit.  General:*** General appearance: Awake and alert. No distress. Cooperative with exam.  Skin: No obvious rash or jaundice. HEENT: Atraumatic. Anicteric. Lungs: Non-labored breathing on room air  Heart: Regular Abdomen: Soft, non tender. Extremities: No edema. No obvious deformity.  Musculoskeletal: No obvious joint swelling.  Neurological: Mental Status: Alert. Speech fluent. No pseudobulbar affect Cranial Nerves: CNII: No RAPD. Visual fields intact. CNIII, IV, VI: PERRL. No  nystagmus. EOMI. CN V: Facial sensation intact bilaterally to fine touch. Masseter clench strong. Jaw jerk***. CN VII: Facial muscles symmetric and strong. No ptosis at rest or after sustained upgaze***. CN VIII: Hears finger rub well bilaterally. CN IX: No hypophonia. CN X: Palate elevates symmetrically. CN XI: Full strength shoulder shrug bilaterally. CN XII: Tongue protrusion full and midline. No atrophy or fasciculations. No significant dysarthria*** Motor: Tone is ***. *** fasciculations in *** extremities. *** atrophy. No grip or percussive myotonia.  Individual muscle group testing (MRC grade out of 5):  Movement     Neck flexion ***    Neck extension ***     Right Left   Shoulder abduction *** ***   Shoulder adduction *** ***   Shoulder ext rotation *** ***   Shoulder int rotation *** ***   Elbow flexion *** ***   Elbow extension *** ***   Wrist extension *** ***   Wrist flexion *** ***   Finger abduction - FDI *** ***   Finger abduction - ADM *** ***   Finger extension *** ***   Finger distal flexion - 2/3 *** ***   Finger distal flexion - 4/5 *** ***   Thumb flexion - FPL *** ***   Thumb abduction - APB *** ***    Hip flexion *** ***   Hip extension *** ***   Hip adduction *** ***   Hip abduction *** ***   Knee extension *** ***   Knee flexion *** ***   Dorsiflexion *** ***   Plantarflexion *** ***   Inversion *** ***   Eversion *** ***   Great toe extension *** ***   Great toe flexion *** ***     Reflexes:  Right Left  Bicep *** ***  Tricep *** ***  BrRad *** ***  Knee *** ***  Ankle *** ***   Pathological Reflexes: Babinski: *** response bilaterally*** Hoffman: *** Troemner: *** Pectoral: *** Palmomental: *** Facial: *** Midline tap: *** Sensation: Pinprick: *** Vibration: *** Temperature: *** Proprioception: *** Coordination: Intact finger-to- nose-finger and heel-to-shin bilaterally. Romberg negative.*** Gait: Able to rise from  chair with arms crossed unassisted. Normal, narrow-based gait. Able to tandem walk. Able to walk on toes and heels.***   Lab and Test Review: New results: 03/07/23: HbA1c: 6.3 CBC unremarkable CMP unremarkable TSH wnl Lipid panel: tChol 195, LDL 120, TG 151  EMG (11/04/22): NCV & EMG Findings: Extensive electrodiagnostic evaluation of bilateral upper limbs shows: Bilateral median, ulnar, and radial sensory responses are within normal limits. The right ulnar sensory response is 41% of the response in the left ulnar sensory response. Left median (APB) motor response shows reduced amplitude (4.3 mV). The right median (APB) motor response is within normal limits. Left ulnar (ADM) motor response shows reduced amplitude (4.2 mV). Right ulnar (ADM) motor response shows decreased conduction velocity from above the elbow to below the elbow stimulation sites (40 m/s). Chronic motor axon loss changes without accompanying active denervation changes are seen in bilateral first dorsal interosseous, bilateral extensor indicis proprius, bilateral flexor carpi ulnaris, bilateral triceps, bilateral cervical paraspinal (C7 level), and left abductor pollicis brevis muscles.   Impression: This is an abnormal study. 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 The findings are mild in degree electrically at right C7 and right C8 levels. The findings are moderate in degree electrically at left C8 level. The findings are moderate to severe at left C7 and left T1 levels. 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 Severity is difficult to grade due to #1 above. No electrodiagnostic evidence of a right or left median mononeuropathy at or distal to the wrist (ie: carpal  tunnel syndrome).   Previously reviewed results: 07/09/22: HbA1c: 6.1 B12: 275 TSH: 2.33 CBC wnl CMP wnl   MRI/MRA brain wo contrast (02/26/2019): Per my read: Multifocal right hemispheric acute strokes in frontal, parietal, and occipital lobes including in precentral and postcentral gyri.   Radiology read: FINDINGS: MR HEAD FINDINGS   BRAIN: Multifocal acute ischemia within the right hemisphere, including along the precentral gyrus, postcentral gyrus, posterior right parietal lobe and right occipital lobe. There is no contralateral acute ischemia. There is petechial hemorrhage at the right parieto-occipital infarct site. There is mild cytotoxic edema at the ischemic sites. The white matter signal is normal for the patient's age. The cerebral and cerebellar volume are age-appropriate. There is no hydrocephalus. The midline structures are normal.   VASCULAR: The major intracranial arterial and venous sinus flow voids are normal.   SKULL AND UPPER CERVICAL SPINE: Calvarial bone marrow signal is normal. There is no skull base mass. The visualized upper cervical spine and soft tissues are normal.   SINUSES/ORBITS: There are no fluid levels or advanced mucosal thickening. The mastoid air cells and middle ear cavities are free of fluid. The orbits are normal.   MR CIRCLE OF WILLIS FINDINGS   POSTERIOR CIRCULATION:   --Vertebral arteries: Normal V4 segments.   --Posterior inferior cerebellar arteries (PICA): Patent origins from the vertebral arteries.   --Anterior inferior cerebellar arteries (AICA): Patent origins from the basilar artery.   --Basilar artery: Normal.   --Superior cerebellar arteries: Normal.   --Posterior cerebral arteries: Normal. The right PCA is partially supplied by a small posterior communicating artery (p-comm).   ANTERIOR CIRCULATION:   --Intracranial internal carotid arteries: Severe stenosis of the distal right cavernous segment (series 7,  image 90). Multifocal mild stenosis of the left cavernous and clinoid segments.   --Anterior cerebral arteries (ACA): Normal. Both A1 segments are present. Patent anterior communicating artery (a-comm).   --Middle cerebral arteries (MCA): Normal.   MRA NECK FINDINGS   Aortic arch: Normal 3  vessel aortic branching pattern. The visualized subclavian arteries are normal.   Right carotid system: Normal course and caliber without stenosis or evidence of dissection.   Left carotid system: Normal course and caliber without stenosis or evidence of dissection.   Vertebral arteries: Codominant. Vertebral artery origins are normal. Vertebral arteries are normal in course and caliber to the vertebrobasilar confluence without stenosis or evidence of dissection.   IMPRESSION: 1. Multifocal acute ischemia within the right hemisphere, including along the precentral gyrus, postcentral gyrus, posterior right parietal lobe and right occipital lobe. 2. Petechial hemorrhage at the right parieto-occipital infarct site. No mass effect. 3. 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 Multifocal mild stenosis of the left cavernous and clinoid segments. 4. Normal MRA of the neck. No proximal embolic source is identified. Correlation with echocardiography may be helpful.   CTA head and neck (02/26/2019): FINDINGS: CTA NECK FINDINGS   Aortic arch: Standard branching. Included portions of the aortic arch demonstrate no evidence of dissection or aneurysm.   Right carotid system: CCA and ICA patent within the neck without significant stenosis (50% or greater). The proximal right ICA is somewhat tortuous. No significant atherosclerotic plaque.   Left carotid system: CCA and ICA patent within the neck without significant stenosis (50% or greater). Mild predominantly noncalcified plaque within the distal common carotid artery and at the bifurcation.   Vertebral  arteries: The bilateral vertebral arteries are patent within the neck without significant stenosis. The left vertebral artery is slightly dominant.   Skeleton: Reversal of the expected cervical lordosis. Mild cervical spondylosis. No acute bony abnormality.   Other neck: No soft tissue neck mass or pathologically enlarged cervical chain lymph nodes. Thyroid unremarkable.   Upper chest: Paraseptal and central lobule emphysema within the imaged lung apices.   Review of the MIP images confirms the above findings   CTA HEAD FINDINGS   Anterior circulation:   The intracranial right internal carotid artery is patent. There is prominent soft and calcified plaque within the cavernous and paraclinoid right ICA. Resultant severe stenosis within the cavernous right ICA (series 9, image 84). Mild to moderate stenosis also present within the paraclinoid right ICA.   Calcified atherosclerotic plaque within the cavernous and paraclinoid left ICA. Mild-to-moderate stenosis within the cavernous left ICA with otherwise no more than mild luminal narrowing.   The right middle and anterior cerebral arteries are patent without significant proximal stenosis.   The left middle and anterior cerebral arteries are patent without significant proximal stenosis.   No intracranial aneurysm is identified.   Posterior circulation:   The intracranial vertebral arteries are patent without significant stenosis.   The basilar artery is patent without significant stenosis.   The right posterior cerebral artery is patent without significant proximal stenosis.   The left posterior cerebral artery is patent. There are mild-to-moderate focal stenoses within the proximal and distal P2 left posterior cerebral artery.   Venous sinuses: Within limitations of contrast timing, no convincing thrombus.   Anatomic variants: None significant   Review of the MIP images confirms the above findings    IMPRESSION: CTA head:   1. 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 Confirmed severe stenosis within the cavernous right ICA. Additional sites of mild to moderate stenosis as described. 2. Mild-to-moderate focal stenoses within the proximal and distal P2 left posterior cerebral artery.   CTA neck:   Common carotid, internal carotid and vertebral arteries patent within the neck bilaterally without significant stenosis.  Mild scattered atherosclerotic plaque within the bilateral carotid systems. No evidence of dissection or aneurysm.   CT cervical spine wo contrast (10/29/2018): FINDINGS: Alignment: Mild reversal of cervical lordosis centered at the level of C5, presumably positional. Alignment is otherwise anatomic.   Skull base and vertebrae: No acute fracture. No primary bone lesion or focal pathologic process.   Soft tissues and spinal canal: No prevertebral fluid or swelling. No visible canal hematoma.   Disc levels: Multilevel degenerative disc disease, most severe at C5-C6 and C6-C7. Mild multilevel facet arthropathy.   Upper chest: Negative.   Other: None.   IMPRESSION: 1. No acute abnormality of the cervical spine. 2. Mild multilevel degenerative disc disease and cervical spondylosis, as above.  ASSESSMENT: This is [name], a 62 y.o. male with:  ***  Plan: ***  Return to clinic in ***  Total time spent reviewing records, interview, history/exam, documentation, and coordination of care on day of encounter:  *** min  Jacquelyne Balint, MD

## 2023-05-01 ENCOUNTER — Ambulatory Visit: Payer: Medicaid Other | Admitting: Podiatry

## 2023-05-07 ENCOUNTER — Encounter: Payer: Self-pay | Admitting: Neurology

## 2023-05-07 ENCOUNTER — Ambulatory Visit: Payer: Medicaid Other | Admitting: Neurology

## 2023-06-10 ENCOUNTER — Other Ambulatory Visit: Payer: Medicaid Other

## 2023-06-13 ENCOUNTER — Encounter: Payer: Medicaid Other | Admitting: Family

## 2023-06-13 NOTE — Progress Notes (Signed)
This encounter was created in error - please disregard. No show

## 2023-07-08 ENCOUNTER — Encounter: Payer: Medicaid Other | Admitting: Family

## 2023-07-08 NOTE — Progress Notes (Signed)
   This encounter was created in error - please disregard. No show

## 2023-07-15 ENCOUNTER — Encounter: Payer: Medicaid Other | Admitting: Family

## 2023-07-16 NOTE — Progress Notes (Signed)
   This encounter was created in error - please disregard. No show

## 2023-07-22 ENCOUNTER — Encounter: Payer: Self-pay | Admitting: Family

## 2023-07-22 ENCOUNTER — Ambulatory Visit (INDEPENDENT_AMBULATORY_CARE_PROVIDER_SITE_OTHER): Payer: Medicaid Other | Admitting: Family

## 2023-07-22 VITALS — BP 152/78 | HR 88 | Temp 98.7°F | Resp 20 | Ht 69.0 in | Wt 210.2 lb

## 2023-07-22 DIAGNOSIS — E785 Hyperlipidemia, unspecified: Secondary | ICD-10-CM

## 2023-07-22 DIAGNOSIS — F1910 Other psychoactive substance abuse, uncomplicated: Secondary | ICD-10-CM

## 2023-07-22 DIAGNOSIS — M5010 Cervical disc disorder with radiculopathy, unspecified cervical region: Secondary | ICD-10-CM

## 2023-07-22 DIAGNOSIS — G44229 Chronic tension-type headache, not intractable: Secondary | ICD-10-CM

## 2023-07-22 DIAGNOSIS — Z2821 Immunization not carried out because of patient refusal: Secondary | ICD-10-CM

## 2023-07-22 DIAGNOSIS — R200 Anesthesia of skin: Secondary | ICD-10-CM

## 2023-07-22 DIAGNOSIS — R7303 Prediabetes: Secondary | ICD-10-CM

## 2023-07-22 DIAGNOSIS — J449 Chronic obstructive pulmonary disease, unspecified: Secondary | ICD-10-CM

## 2023-07-22 DIAGNOSIS — L309 Dermatitis, unspecified: Secondary | ICD-10-CM | POA: Diagnosis not present

## 2023-07-22 DIAGNOSIS — F17209 Nicotine dependence, unspecified, with unspecified nicotine-induced disorders: Secondary | ICD-10-CM | POA: Diagnosis not present

## 2023-07-22 DIAGNOSIS — I10 Essential (primary) hypertension: Secondary | ICD-10-CM

## 2023-07-22 MED ORDER — ATORVASTATIN CALCIUM 80 MG PO TABS: 80.0000 mg | ORAL_TABLET | Freq: Every day | ORAL | 1 refills | Status: DC

## 2023-07-22 MED ORDER — BUPROPION HCL ER (SR) 200 MG PO TB12: 200.0000 mg | ORAL_TABLET | Freq: Two times a day (BID) | ORAL | 1 refills | Status: DC

## 2023-07-22 MED ORDER — AMLODIPINE BESYLATE 10 MG PO TABS
10.0000 mg | ORAL_TABLET | Freq: Every day | ORAL | 3 refills | Status: DC
Start: 2023-07-22 — End: 2024-03-01

## 2023-07-22 MED ORDER — TAMSULOSIN HCL 0.4 MG PO CAPS: 0.4000 mg | ORAL_CAPSULE | Freq: Every day | ORAL | 11 refills | Status: AC

## 2023-07-22 MED ORDER — TRIAMCINOLONE ACETONIDE 0.1 % EX CREA: 1.0000 | TOPICAL_CREAM | Freq: Two times a day (BID) | CUTANEOUS | 0 refills | Status: DC

## 2023-07-22 MED ORDER — LISINOPRIL 40 MG PO TABS: 40.0000 mg | ORAL_TABLET | Freq: Every morning | ORAL | 1 refills | Status: DC

## 2023-07-22 MED ORDER — ALBUTEROL SULFATE HFA 108 (90 BASE) MCG/ACT IN AERS: 2.0000 | INHALATION_SPRAY | Freq: Four times a day (QID) | RESPIRATORY_TRACT | 3 refills | Status: AC | PRN

## 2023-07-22 NOTE — Patient Instructions (Signed)
 [facility]  [address] [place], [address]  [place], [place] [address] Tel: [phone] Fax: [phone]

## 2023-07-22 NOTE — Progress Notes (Addendum)
 Provider: Richarda Blade FNP-C   Denika Krone, Donalee Citrin, NP  Patient Care Team: Curly Mackowski, Donalee Citrin, NP as PCP - General (Family Medicine) Anson Fret, MD as Consulting Physician (Neurology) Rene Paci, MD as Consulting Physician (Urology) Margaretmary Dys, MD as Consulting Physician (Radiation Oncology) Shaune Leeks as Social Worker  Extended Emergency Contact Information Primary Emergency Contact: [place], [place] [address] Darden [place] of [place] Home Phone: [phone] Relation: Brother  Code Status:  Full Code  Goals of care: Advanced Directive information    07/22/2023    1:49 PM  Advanced Directives  Does Patient Have a Medical Advance Directive? No  Would patient like information on creating a medical advance directive? No - Patient declined     Chief Complaint  Patient presents with   Medical Management of Chronic Issues    6 months check up and discuss Hepatitis C Screening,colonoscopy,shingles,covid,and pneumococcal vaccines.    Discussed the use of AI scribe software for clinical note transcription with the patient, who gave verbal consent to proceed.  History of Present Illness   Frank Fischer is a 63 year old male with hypertension and chronic headaches who presents for a follow-up visit.  He has hypertension with a current blood pressure of 152/78, elevated from 128/76 at the last visit. He has not been monitoring his blood pressure at home. There is a slight weight gain of one pound since the last visit, now weighing 210 pounds.  He continues to experience severe headaches, describing them as 'my head still hurts like crazy.' He has not received a call for the MRI of his neck that was ordered previously. He also mentions a CT scan order related to his smoking history, but has not been contacted for that either. He has not contacted the neurologist due to not having his number, which he has now received.  He is currently  taking several medications: albuterol as needed, tamsulosin for prostate issues, atorvastatin for cholesterol, lisinopril 40 mg, amlodipine 10 mg for blood pressure, and Wellbutrin 150 mg twice daily for depression and smoking cessation. He uses albuterol and continues to smoke two to three cigarettes daily. He also smokes marijuana, which he states helps him relax.  He reports generalized body aches and mentions a specific concern about his bones, particularly noting a past issue with his ankle. He describes a sensation of something being 'right there' in his gut, feeling like a rib or something broken, which has been present for a while. Chest X-ray ordered to evaluate in the past visit but did not get it done.No numbness or tingling in his legs but states has numbness and tingling on the hands requires assistance with opening stuff.He request Aide to assist with his ADL's.   He reports a rash on both arms and his neck, which itches when he sweats.  He has a history of dental issues, having had all his teeth removed two years ago due to dental caries. He has not seen a dentist since then.  No changes in vision, but notes that his right eye is slightly larger than the left, which he believes has always been the case. He has not had any recent eye exams since his previous eye doctor retired.  He has a history of elevated A1c levels, indicating prediabetes, and elevated LDL cholesterol and triglycerides. He consumes potatoes and onions frequently, which may contribute to his elevated triglycerides. He reports a decrease in weight  from 216 to 210 pounds.  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 He does not engage in regular exercise.   Past Medical History:  Diagnosis Date   Arthritis    Cervical stenosis of spine    Chronic headaches    Dental caries    periodontal disease   Frequency of urination    History of cerebral infarction  11-28-2017  per pt never had symptoms   per MRI imaging 10-31-2016 2 small chronic lacunar infartions in the right frontal periventricular white matter , and chronic left medial orbital fracture (as seen 2014 MRI)   Hypertension    Noncompliance with medication regimen    Prostate cancer [facility]) urologist-  dr winter/  oncologist-  dr Kathrynn Running   dx 07-21-2017--- Stage T1c, Gleason 4+3,  PSA 13.40,  vol 49.7cc--  scheduled for radioactive seed implants 12-03-2017   Past Surgical History:  Procedure Laterality Date   ABDOMINAL EXPLORATION SURGERY  age 63   per pt had Small Bowel Resection and Appendectomy   APPENDECTOMY     ARM SURGERY Left 1999   REPAIR INJURY-- "ARM CUT ABOUT OFF"   IR CATHETER TUBE CHANGE  10/28/2021   IR GUIDED DRAIN W CATHETER PLACEMENT  09/03/2021   IR US GUIDANCE  09/03/2021   PROSTATE BIOPSY  07/21/2017  dr winter office   RADIOACTIVE SEED IMPLANT N/A 12/03/2017   Procedure: RADIOACTIVE SEED IMPLANT/BRACHYTHERAPY IMPLANT;  Surgeon: Rene Paci, MD;  Location: [facility];  Service: Urology;  Laterality: N/A;  ONLY NEEDS 120 MIN   SPACE OAR INSTILLATION N/A 12/03/2017   Procedure: SPACE OAR INSTILLATION;  Surgeon: Rene Paci, MD;  Location: [facility];  Service: Urology;  Laterality: N/A;   TOOTH EXTRACTION N/A 09/21/2018   Procedure: DENTAL EXTRACTIONS WITH ALVEOLIPLASTY;  Surgeon: Ocie Doyne, DDS;  Location: [facility] OR;  Service: Oral Surgery;  Laterality: N/A;    Allergies  Allergen Reactions   Penicillins Itching    Did it involve swelling of the face/tongue/throat, SOB, or low BP? No Did it involve sudden or severe rash/hives, skin peeling, or any reaction on the inside of your mouth or nose? Yes Did you need to seek medical attention at a hospital or doctor's office? No When did it last happen?      09/2018 If all above answers are "NO", may proceed with cephalosporin use.    Allergies as of 07/22/2023        Reactions   Penicillins Itching   Did it involve swelling of the face/tongue/throat, SOB, or low BP? No Did it involve sudden or severe rash/hives, skin peeling, or any reaction on the inside of your mouth or nose? Yes Did you need to seek medical attention at a hospital or doctor's office? No When did it last happen?      09/2018 If all above answers are "NO", may proceed with cephalosporin use.        Medication List        Accurate as of July 22, 2023 10:37 PM. If you have any questions, ask your nurse or doctor.          albuterol 108 (90 Base) MCG/ACT inhaler Commonly known as: VENTOLIN HFA Inhale 2 puffs into the lungs every 6 (six) hours as needed for wheezing or shortness of breath.   amLODipine 10 MG tablet Commonly known as: NORVASC Take 1 tablet (10 mg total) by  mouth daily.   ASPIRIN 81 PO Take 81 mg by mouth.   atorvastatin 80 MG tablet Commonly known as: LIPITOR Take 1 tablet (80 mg total) by mouth daily. What changed: See the new instructions. Changed by: Donalee Citrin Lakeisha Waldrop   buPROPion 200 MG 12 hr tablet Commonly known as: WELLBUTRIN SR Take 1 tablet (200 mg total) by mouth 2 (two) times daily. What changed:  medication strength how much to take Changed by: Ilisha Blust C Kanishk Stroebel   lisinopril 40 MG tablet Commonly known as: ZESTRIL Take 1 tablet (40 mg total) by mouth every morning.   tamsulosin 0.4 MG Caps capsule Commonly known as: FLOMAX Take 1 capsule (0.4 mg total) by mouth daily.   triamcinolone cream 0.1 % Commonly known as: KENALOG Apply 1 Application topically 2 (two) times daily. Started by: Donalee Citrin Destinae Neubecker        Review of Systems  Constitutional:  Negative for appetite change, chills, fatigue, fever and unexpected weight change.  HENT:  Negative for congestion, dental problem, ear discharge, ear pain, facial swelling, hearing loss, nosebleeds, postnasal drip, rhinorrhea, sinus pressure, sinus pain, sneezing, sore throat, tinnitus and  trouble swallowing.   Eyes:  Negative for pain, discharge, redness, itching and visual disturbance.  Respiratory:  Negative for cough, chest tightness, shortness of breath and wheezing.   Cardiovascular:  Negative for chest pain, palpitations and leg swelling.  Gastrointestinal:  Negative for abdominal distention, abdominal pain, blood in stool, constipation, diarrhea, nausea and vomiting.  Endocrine: Negative for cold intolerance, heat intolerance, polydipsia, polyphagia and polyuria.  Genitourinary:  Negative for difficulty urinating, dysuria, flank pain, frequency and urgency.  Musculoskeletal:  Negative for arthralgias, back pain, gait problem, joint swelling, myalgias, neck pain and neck stiffness.  Skin:  Positive for rash. Negative for color change, pallor and wound.  Neurological:  Positive for headaches. Negative for dizziness, syncope, speech difficulty, weakness, light-headedness and numbness.  Hematological:  Does not bruise/bleed easily.  Psychiatric/Behavioral:  Negative for agitation, behavioral problems, confusion, hallucinations, self-injury, sleep disturbance and suicidal ideas. The patient is not nervous/anxious.     Immunization History  Administered Date(s) Administered   Tdap 07/30/2022   Pertinent  Health Maintenance Due  Topic Date Due   INFLUENZA VACCINE  08/18/2023 (Originally 12/19/2022)   Colonoscopy  09/03/2023 (Originally 02/28/2004)      10/28/2021    1:16 AM 08/06/2022    9:26 AM 09/12/2022   10:51 AM 03/12/2023   10:06 AM 07/22/2023    1:49 PM  Fall Risk  Falls in the past year?  0 0 1 0  Was there an injury with Fall?  0 0 0 0  Fall Risk Category Calculator  0 0 1 0  (RETIRED) Patient Fall Risk Level Low fall risk      Patient at Risk for Falls Due to  No Fall Risks  History of fall(s) No Fall Risks  Fall risk Follow up  Falls evaluation completed Falls evaluation completed  Falls evaluation completed;Education provided;Falls prevention discussed    Functional Status Survey:    Vitals:   07/22/23 1351  BP: (!) 152/78  Pulse: 88  Resp: 20  Temp: 98.7 F (37.1 C)  SpO2: 95%  Weight: 210 lb 3.2 oz (95.3 kg)  Height: 5\' 9"  (1.753 m)   Body mass index is 31.04 kg/m. Physical Exam VITALS: T- 98.7, P- 88, BP- 152/78, SaO2- 95% MEASUREMENTS: Weight- 210. GENERAL: Alert, cooperative, well developed, no acute distress. HEENT: Normocephalic, normal oropharynx, moist mucous membranes, ears  normal with no infection, nose normal, pupils reactive to light, no sinus tenderness, no lymphadenopathy. NECK: No lymphadenopathy. CHEST: Clear to auscultation bilaterally, no wheezes, rhonchi, or crackles. CARDIOVASCULAR: Normal heart rate and rhythm, S1 and S2 normal without murmurs. ABDOMEN: Soft, non-tender, non-distended, without organomegaly, normal bowel sounds. EXTREMITIES: No cyanosis or edema. MUSCULOSKELETAL: Normal range of motion in hips. SKIN: dark hyperpigmented dry skin on both forearms noted NEUROLOGICAL: Cranial nerves grossly intact, moves all extremities without gross motor or sensory deficit. PSYCHIATRY/BEHAVIORAL: Mood stable   Labs reviewed: Recent Labs    03/07/23 0901  NA 139  K 4.2  CL 106  CO2 22  GLUCOSE 90  BUN 11  CREATININE 0.88  CALCIUM 9.4   Recent Labs    03/07/23 0901  AST 26  ALT 28  BILITOT 0.4  PROT 7.2   Recent Labs    03/07/23 0901  WBC 5.0  NEUTROABS 2,655  HGB 15.5  HCT 44.6  MCV 94.3  PLT 286   Lab Results  Component Value Date   TSH 2.62 03/07/2023   Lab Results  Component Value Date   HGBA1C 6.3 (H) 03/07/2023   Lab Results  Component Value Date   CHOL 195 03/07/2023   HDL 49 03/07/2023   LDLCALC 120 (H) 03/07/2023   TRIG 151 (H) 03/07/2023   CHOLHDL 4.0 03/07/2023    Significant Diagnostic Results in last 30 days:  No results found.  Assessment/Plan  Headache Persistent headache with no clear etiology. MRI for the cervical spine ordered by Dr. Loleta Chance is  pending. Emphasized hydration, suggesting increasing water intake to 6-8 glasses per day to alleviate headaches. - Follow up with neurologist regarding MRI for cervical spine - Increase water intake to 6-8 glasses per day  Chronic Pain Reports body aches and abdominal pain, possibly related to smoking. Advised smoking cessation to alleviate pain and prevent complications. Discussed potential need for x-ray to evaluate rib pain. - Encourage smoking cessation - Consider x-ray for rib pain at [facility]  Cervical radiculopathy /numbness and tingling of hands  - follows up with Neurologist  - limiting his activity of daily living unable to open stuff  - Request re certification of [facility] Aide.   Depression and Smoking Cessation Currently on Wellbutrin for depression and smoking cessation, but reports it is ineffective. Adjusted Wellbutrin dosage to 200 mg twice daily. - Adjust Wellbutrin dosage to 200 mg twice daily - Encourage smoking cessation  Hypertension Blood pressure elevated at 152/78 compared to previous 128/76. Currently on lisinopril and amlodipine. Emphasized exercise and weight management to control blood pressure. - Encourage exercise, such as brisk walking, at least three times a week for 30 minutes - Continue lisinopril and amlodipine as prescribed  Hyperlipidemia Currently on atorvastatin. LDL cholesterol and triglycerides are elevated. Discussed dietary modifications to reduce triglycerides, emphasizing reducing intake of potatoes and other carbohydrates. - Continue atorvastatin as prescribed - Reduce intake of potatoes and other carbohydrates  Prediabetes A1c is elevated, indicating prediabetes. Emphasized dietary modifications and weight management to control blood sugar levels. - Recheck A1c levels - Encourage dietary modifications to reduce carbohydrate intake  Dermatitis Reports itchy rash on arms and neck, exacerbated by sweating. Prescribed steroid cream  and advised on application and sun exposure precautions. - Prescribe steroid cream for rash - Advise on thin application and avoiding sun exposure after application  General Health Maintenance Due for vaccinations, including flu, pneumonia, and shingles, but declined. Due for colonoscopy and has Cologuard box at home. -  Encourage completion of Cologuard test - Discuss benefits of vaccinations, including flu, pneumonia, and shingles   Family/ staff Communication: Reviewed plan of care with patient verbalized understanding   Labs/tests ordered: None   Next Appointment : Return in about 6 months (around 01/22/2024) for medical mangement of chronic issues.Marland Kitchen   Spent 30 minutes of Face to face and non-face to face with patient  >50% time spent counseling; reviewing medical record; tests; labs; documentation and developing future plan of care.   Caesar Bookman, NP

## 2023-07-23 LAB — COMPLETE METABOLIC PANEL WITH GFR
AG Ratio: 1.7 (calc) (ref 1.0–2.5)
ALT: 32 U/L (ref 9–46)
AST: 29 U/L (ref 10–35)
Albumin: 4.5 g/dL (ref 3.6–5.1)
Alkaline phosphatase (APISO): 89 U/L (ref 35–144)
BUN: 11 mg/dL (ref 7–25)
CO2: 25 mmol/L (ref 20–32)
Calcium: 9.7 mg/dL (ref 8.6–10.3)
Chloride: 106 mmol/L (ref 98–110)
Creat: 1.16 mg/dL (ref 0.70–1.35)
Globulin: 2.6 g/dL (ref 1.9–3.7)
Glucose, Bld: 119 mg/dL (ref 65–139)
Potassium: 4 mmol/L (ref 3.5–5.3)
Sodium: 141 mmol/L (ref 135–146)
Total Bilirubin: 0.5 mg/dL (ref 0.2–1.2)
Total Protein: 7.1 g/dL (ref 6.1–8.1)
eGFR: 70 mL/min/{1.73_m2} (ref >=60)

## 2023-07-23 LAB — CBC WITH DIFFERENTIAL/PLATELET
Absolute Lymphocytes: 1725 {cells}/uL (ref 850–3900)
Absolute Monocytes: 442 {cells}/uL (ref 200–950)
Basophils Absolute: 39 {cells}/uL (ref 0–200)
Basophils Relative: 0.7 %
Eosinophils Absolute: 62 {cells}/uL (ref 15–500)
Eosinophils Relative: 1.1 %
HCT: 46.4 % (ref 38.5–50.0)
Hemoglobin: 15.7 g/dL (ref 13.2–17.1)
MCH: 32.5 pg (ref 27.0–33.0)
MCHC: 33.8 g/dL (ref 32.0–36.0)
MCV: 96.1 fL (ref 80.0–100.0)
MPV: 10.3 fL (ref 7.5–12.5)
Monocytes Relative: 7.9 %
Neutro Abs: 3332 {cells}/uL (ref 1500–7800)
Neutrophils Relative %: 59.5 %
Platelets: 266 10*3/uL (ref 140–400)
RBC: 4.83 10*6/uL (ref 4.20–5.80)
RDW: 13.7 % (ref 11.0–15.0)
Total Lymphocyte: 30.8 %
WBC: 5.6 10*3/uL (ref 3.8–10.8)

## 2023-07-23 LAB — LIPID PANEL
Cholesterol: 184 mg/dL (ref <200)
HDL: 46 mg/dL (ref >=40)
LDL Cholesterol (Calc): 111 mg/dL — ABNORMAL HIGH
Non-HDL Cholesterol (Calc): 138 mg/dL — ABNORMAL HIGH (ref <130)
Total CHOL/HDL Ratio: 4 (calc) (ref <5.0)
Triglycerides: 159 mg/dL — ABNORMAL HIGH (ref <150)

## 2023-07-23 LAB — HEMOGLOBIN A1C
Hgb A1c MFr Bld: 6.1 %{Hb} — ABNORMAL HIGH (ref <5.7)
Mean Plasma Glucose: 128 mg/dL
eAG (mmol/L): 7.1 mmol/L

## 2023-07-29 ENCOUNTER — Telehealth: Payer: Self-pay | Admitting: Family

## 2023-07-29 NOTE — Telephone Encounter (Signed)
 [place] request for independent assessment for personal Care service attestation of medical Need forms completed and placed on outgoing faxes box to be faxed as requested.

## 2023-07-30 ENCOUNTER — Telehealth: Payer: Self-pay

## 2023-07-30 NOTE — Telephone Encounter (Signed)
 Copied from CRM [id number]. Topic: General - Other >> Jul 30, 2023  1:00 PM Corin V wrote: Reason for CRM: Dois Davenport with patient insurance calling for [facility] May. For the forms regarding the personal care services for the patient she needs the  most recent office notes. The last visit was March 4, but she did not receive those notes.  F: [phone]. P: [phone]   Note was faxed using the routing feature in epic.  Refer to chart review- Misc Reports- Chart review:Routing History for documentation

## 2023-07-30 NOTE — Telephone Encounter (Signed)
 Thank You.

## 2023-08-07 ENCOUNTER — Other Ambulatory Visit: Payer: Self-pay

## 2023-08-07 DIAGNOSIS — E785 Hyperlipidemia, unspecified: Secondary | ICD-10-CM

## 2023-08-07 MED ORDER — EZETIMIBE 10 MG PO TABS: 10.0000 mg | ORAL_TABLET | Freq: Every day | ORAL | 3 refills | Status: DC

## 2023-12-12 ENCOUNTER — Other Ambulatory Visit

## 2023-12-12 DIAGNOSIS — E785 Hyperlipidemia, unspecified: Secondary | ICD-10-CM

## 2023-12-17 ENCOUNTER — Ambulatory Visit: Payer: Self-pay

## 2023-12-17 NOTE — Telephone Encounter (Signed)
 FYI Only or Action Required?: FYI only for provider.  Patient was last seen in primary care on 07/22/2023 by Ngetich, Roxan BROCKS, NP.  Called Nurse Triage reporting Hip Pain.  Symptoms began several months ago.  Interventions attempted: OTC medications: Tylenol .  Symptoms are: stable.  Triage Disposition: See PCP When Office is Open (Within 3 Days)  Patient/caregiver understands and will follow disposition?: Yes, but will wait                             Copied from CRM #[id number]. Topic: Clinical - Pink Word Triage >> Dec 17, 2023 10:21 AM Alfonso ORN wrote: Reason for Triage: right hip pain ,rate 5 or 6 mainly when get up and walk  new symptom be going on for about a month or more its on and off  patient call back number [phone] Reason for Disposition  [1] MODERATE pain (e.g., interferes with normal activities, limping) AND [2] present > 3 days  Answer Assessment - Initial Assessment Questions 1. LOCATION and RADIATION: Where is the pain located? Does the pain spread (shoot) anywhere else?     Right hip, sometimes pain goes around to by back 2. QUALITY: What does the pain feel like?  (e.g., sharp, dull, aching, burning)     Sharp pain in joint of hip 3. SEVERITY: How bad is the pain? What does it keep you from doing?   (Scale 1-10; or mild, moderate, severe)     Rates pain 5-6 4. ONSET: When did the pain start? Does it come and go, or is it there all the time?     At least 2 months 5. WORK OR EXERCISE: Has there been any recent work or exercise that involved this part of the body?      Denies 6. CAUSE: What do you think is causing the hip pain?      Unsure 7. AGGRAVATING FACTORS: What makes the hip pain worse? (e.g., walking, climbing stairs, running)     Walking 8. OTHER SYMPTOMS: Do you have any other symptoms? (e.g., back pain, pain shooting down leg,  fever, rash)    Sometimes if I turn the wrong way, I feel weakness,  denies numbness, denies tingling, denies fever, denies rash    Patient already has an appointment with PCP scheduled for 12/24/23. No sooner availability with PCP in office. Patient declined sooner appointments with alternate providers and stated he would be fine waiting until his previously scheduled appointment.  Protocols used: Hip Pain-A-AH

## 2023-12-17 NOTE — Telephone Encounter (Signed)
 Noted

## 2023-12-17 NOTE — Telephone Encounter (Signed)
 Patient previously triaged today for same symptoms.                            Message from Alfonso ORN sent at 12/17/2023 10:21 AM EDT  right hip pain ,rate 5 or 6 mainly when get up and walk new symptom be going on for about a month or more patient call back number [phone]   This encounter was created in error - please disregard.

## 2023-12-17 NOTE — Telephone Encounter (Signed)
 Will evaluate hip pain on upcoming visit.

## 2023-12-23 ENCOUNTER — Other Ambulatory Visit

## 2023-12-24 ENCOUNTER — Encounter: Admitting: Family

## 2024-01-06 ENCOUNTER — Encounter: Admitting: Family

## 2024-01-06 ENCOUNTER — Other Ambulatory Visit

## 2024-01-10 ENCOUNTER — Emergency Department (HOSPITAL_COMMUNITY)
Admission: EM | Admit: 2024-01-10 | Discharge: 2024-01-10 | Disposition: A | Discharge status: Home / Self Care | Attending: Emergency Medicine | Admitting: Emergency Medicine

## 2024-01-10 ENCOUNTER — Encounter (HOSPITAL_COMMUNITY): Payer: Self-pay

## 2024-01-10 ENCOUNTER — Emergency Department (HOSPITAL_COMMUNITY)

## 2024-01-10 ENCOUNTER — Other Ambulatory Visit: Payer: Self-pay

## 2024-01-10 DIAGNOSIS — Z7982 Long term (current) use of aspirin: Secondary | ICD-10-CM | POA: Diagnosis not present

## 2024-01-10 DIAGNOSIS — N451 Epididymitis: Secondary | ICD-10-CM

## 2024-01-10 DIAGNOSIS — N50811 Right testicular pain: Secondary | ICD-10-CM | POA: Insufficient documentation

## 2024-01-10 DIAGNOSIS — N50812 Left testicular pain: Secondary | ICD-10-CM | POA: Insufficient documentation

## 2024-01-10 LAB — BASIC METABOLIC PANEL WITH GFR
Anion gap: 7 (ref 5–15)
BUN: 9 mg/dL (ref 8–23)
CO2: 25 mmol/L (ref 22–32)
Calcium: 8.8 mg/dL — ABNORMAL LOW (ref 8.9–10.3)
Chloride: 103 mmol/L (ref 98–111)
Creatinine, Ser: 1.23 mg/dL (ref 0.61–1.24)
GFR, Estimated: >60 mL/min (ref >60)
Glucose, Bld: 109 mg/dL — ABNORMAL HIGH (ref 70–99)
Potassium: 4.2 mmol/L (ref 3.5–5.1)
Sodium: 135 mmol/L (ref 135–145)

## 2024-01-10 LAB — CBC WITH DIFFERENTIAL/PLATELET
Abs Immature Granulocytes: 0.03 K/uL (ref 0.00–0.07)
Basophils Absolute: 0 K/uL (ref 0.0–0.1)
Basophils Relative: 0 %
Eosinophils Absolute: 0 K/uL (ref 0.0–0.5)
Eosinophils Relative: 0 %
HCT: 44.7 % (ref 39.0–52.0)
Hemoglobin: 15.1 g/dL (ref 13.0–17.0)
Immature Granulocytes: 0 %
Lymphocytes Relative: 18 %
Lymphs Abs: 1.7 K/uL (ref 0.7–4.0)
MCH: 32.3 pg (ref 26.0–34.0)
MCHC: 33.8 g/dL (ref 30.0–36.0)
MCV: 95.7 fL (ref 80.0–100.0)
Monocytes Absolute: 0.9 K/uL (ref 0.1–1.0)
Monocytes Relative: 9 %
Neutro Abs: 7.1 K/uL (ref 1.7–7.7)
Neutrophils Relative %: 73 %
Platelets: 241 K/uL (ref 150–400)
RBC: 4.67 MIL/uL (ref 4.22–5.81)
RDW: 14.6 % (ref 11.5–15.5)
WBC: 9.9 K/uL (ref 4.0–10.5)
nRBC: 0 % (ref 0.0–0.2)

## 2024-01-10 LAB — URINALYSIS, ROUTINE W REFLEX MICROSCOPIC
Bilirubin Urine: NEGATIVE
Glucose, UA: NEGATIVE mg/dL
Ketones, ur: NEGATIVE mg/dL
Nitrite: POSITIVE — AB
Protein, ur: NEGATIVE mg/dL
Specific Gravity, Urine: 1.015 (ref 1.005–1.030)
WBC, UA: >50 WBC/hpf (ref 0–5)
pH: 5 (ref 5.0–8.0)

## 2024-01-10 MED ORDER — MORPHINE SULFATE (PF) 4 MG/ML IV SOLN
4.0000 mg | Freq: Once | INTRAVENOUS | Status: AC
  Administered 2024-01-10: 4 mg via INTRAVENOUS
  Filled 2024-01-10: qty 1

## 2024-01-10 MED ORDER — LEVOFLOXACIN 500 MG PO TABS: 500.0000 mg | ORAL_TABLET | Freq: Every day | ORAL | 0 refills | Status: AC

## 2024-01-10 MED ORDER — NAPROXEN 375 MG PO TABS: 375.0000 mg | ORAL_TABLET | Freq: Two times a day (BID) | ORAL | 0 refills | Status: DC

## 2024-01-10 NOTE — ED Provider Notes (Signed)
 [place] EMERGENCY DEPARTMENT AT [facility] Provider Note   CSN: [id number] Arrival date & time: 01/10/24  9180     Patient presents with: Testicle Pain   Frank Fischer is a 63 y.o. male.   HPI   63 year old male presents emergency department with bilateral testicular pain.  Patient states that this started yesterday when he woke up.  Was gradual onset, initially primarily on the left, then radiated to the right and is now complaining of bilateral testicular pain.  He feels like the testicles are slightly swollen as well.  He denies any penile pain, discharge or dysuria.  Did note a couple bouts of hematuria earlier.  Denies any trauma to the area.  Does not have acute concerns for STDs at this time.  Prior to Admission medications   Medication Sig Start Date End Date Taking? Authorizing Provider  albuterol  (VENTOLIN  HFA) 108 (90 Base) MCG/ACT inhaler Inhale 2 puffs into the lungs every 6 (six) hours as needed for wheezing or shortness of breath. 07/22/23   Ngetich, Dinah C, NP  amLODipine  (NORVASC ) 10 MG tablet Take 1 tablet (10 mg total) by mouth daily. 07/22/23   Ngetich, Dinah C, NP  ASPIRIN  81 PO Take 81 mg by mouth.    [provider]  atorvastatin  (LIPITOR ) 80 MG tablet Take 1 tablet (80 mg total) by mouth daily. 07/22/23   Ngetich, Dinah C, NP  buPROPion  (WELLBUTRIN  SR) 200 MG 12 hr tablet Take 1 tablet (200 mg total) by mouth 2 (two) times daily. 07/22/23   Ngetich, Dinah C, NP  ezetimibe  (ZETIA ) 10 MG tablet Take 1 tablet (10 mg total) by mouth daily. 08/07/23   Ngetich, Dinah C, NP  lisinopril  (ZESTRIL ) 40 MG tablet Take 1 tablet (40 mg total) by mouth every morning. 07/22/23   Ngetich, Dinah C, NP  tamsulosin  (FLOMAX ) 0.4 MG CAPS capsule Take 1 capsule (0.4 mg total) by mouth daily. 07/22/23   Ngetich, Dinah C, NP  triamcinolone  cream (KENALOG ) 0.1 % Apply 1 Application topically 2 (two) times daily. 07/22/23   Ngetich, Dinah C, NP    Allergies: Penicillins     Review of Systems  Constitutional:  Negative for fever.  Respiratory:  Negative for shortness of breath.   Cardiovascular:  Negative for chest pain.  Gastrointestinal:  Negative for abdominal pain, diarrhea and vomiting.  Genitourinary:  Positive for hematuria, scrotal swelling and testicular pain. Negative for difficulty urinating, dysuria, flank pain, frequency, penile discharge, penile pain and penile swelling.  Skin:  Negative for rash.  Neurological:  Negative for headaches.    Updated Vital Signs BP (!) 159/88 (BP Location: Right Arm)   Pulse 82   Temp 98.5 F (36.9 C)   Resp 20   Ht 5' 10 (1.778 m)   Wt 95.3 kg   SpO2 95%   BMI 30.13 kg/m   Physical Exam Vitals and nursing note reviewed.  Constitutional:      Appearance: Normal appearance.  HENT:     Head: Normocephalic.     Mouth/Throat:     Mouth: Mucous membranes are moist.  Cardiovascular:     Rate and Rhythm: Normal rate.  Pulmonary:     Effort: Pulmonary effort is normal. No respiratory distress.  Abdominal:     Palpations: Abdomen is soft.     Tenderness: There is no abdominal tenderness.  Genitourinary:    Penis: Normal.      Comments: Bilateral slightly swollen scrotum, diffusely tender to touch, difficult to  palpate anatomical structures of the testes, no focal swelling or palpated hernia, no visual inguinal hernia or skin changes of the lower abdomen/groin Skin:    General: Skin is warm.  Neurological:     Mental Status: He is alert and oriented to person, place, and time. Mental status is at baseline.  Psychiatric:        Mood and Affect: Mood normal.     (all labs ordered are listed, but only abnormal results are displayed) Labs Reviewed  CBC WITH DIFFERENTIAL/PLATELET  BASIC METABOLIC PANEL WITH GFR  URINALYSIS, ROUTINE W REFLEX MICROSCOPIC    EKG: None  Radiology: No results found.   Procedures   Medications Ordered in the ED - No data to display                                   Medical Decision Making Amount and/or Complexity of Data Reviewed Labs: ordered. Radiology: ordered.  Risk Prescription drug management.   63 year old male presents emergency department bilateral testicular tenderness.  Vitals are normal and stable on arrival.  He has some mild scrotal edema bilaterally with tenderness to palpation.  Blood work is reassuring. UA shows UTI, urine culture sent.  Ultrasound shows findings of bilateral epididymitis.  Discussed these results with the patient.  Will plan to treat with pain medicine, antibiotic and supportive measures.  Patient at this time appears safe and stable for discharge and close outpatient follow up. Discharge plan and strict return to ED precautions discussed, patient verbalizes understanding and agreement.     Final diagnoses:  None    ED Discharge Orders     None          Bari Roxie HERO, DO 01/10/24 1301

## 2024-01-10 NOTE — Discharge Instructions (Signed)
 You have been seen and discharged from the emergency department.  You were found to have a urinary tract infection that is affecting the epididymis of the testicles.  Take antibiotic as directed.  Use pain medicine as needed.  Follow-up with your primary provider for further evaluation and further care. Take home medications as prescribed. If you have any worsening symptoms or further concerns for your health please return to an emergency department for further evaluation.

## 2024-01-10 NOTE — ED Triage Notes (Signed)
 Pt bib POV c/o genital pain. Pt states the pain is tender to touch. Pt denies swelling, redness, or discharge. Pain started yesterday when he woke up. Pt has hx prostate cancer

## 2024-01-13 NOTE — Progress Notes (Signed)
   This encounter was created in error - please disregard. No show

## 2024-01-14 LAB — URINE CULTURE: Culture: >=100000 — AB

## 2024-01-15 ENCOUNTER — Telehealth (HOSPITAL_BASED_OUTPATIENT_CLINIC_OR_DEPARTMENT_OTHER): Payer: Self-pay | Admitting: *Deleted

## 2024-01-15 NOTE — Telephone Encounter (Signed)
 Post ED Visit - Positive Culture Follow-up  Culture report reviewed by antimicrobial stewardship pharmacist: [name] [x]  Feliciano Close, Pharm.D. []  Venetia Gully, Pharm.D., BCPS AQ-ID []  Garrel Crews, Pharm.D., BCPS []  Almarie Lunger, [address].D., BCPS []  [place], [address].D., BCPS, AAHIVP []  Rosaline Bihari, Pharm.D., BCPS, AAHIVP []  Vernell Meier, PharmD, BCPS []  Latanya Hint, PharmD, BCPS []  Donald Medley, PharmD, BCPS []  Rocky Bold, PharmD []  Dorothyann Alert, PharmD, BCPS []  Morene Babe, PharmD  [facility] []  Rosaline Edison, PharmD []  Romona Bliss, PharmD []  Dolphus Roller, PharmD []  Veva Seip, Rph []  Vernell Daunt) Leonce, PharmD []  Eva Allis, PharmD []  Rosaline Millet, PharmD []  Iantha Batch, PharmD []  Arvin Gauss, PharmD []  Wanda Hasting, PharmD []  Ronal Rav, PharmD []  Rocky Slade, PharmD []  Bard Jeans, PharmD   Positive urine culture Treated with levofloxacin , organism sensitive to the same and no further patient follow-up is required at this time.  Lorita Barnie Pereyra 01/15/2024, 11:15 AM

## 2024-01-27 ENCOUNTER — Ambulatory Visit: Admitting: Family

## 2024-02-21 ENCOUNTER — Inpatient Hospital Stay (HOSPITAL_COMMUNITY): Admitting: Certified Registered Nurse Anesthetist

## 2024-02-21 ENCOUNTER — Inpatient Hospital Stay (HOSPITAL_COMMUNITY)

## 2024-02-21 ENCOUNTER — Emergency Department (HOSPITAL_COMMUNITY)

## 2024-02-21 ENCOUNTER — Inpatient Hospital Stay (HOSPITAL_COMMUNITY)
Admission: EM | Admit: 2024-02-21 | Discharge: 2024-02-27 | DRG: 023 | Disposition: A | Discharge status: Rehabilitation Facility | Attending: Internal Medicine | Admitting: Internal Medicine

## 2024-02-21 ENCOUNTER — Encounter (HOSPITAL_COMMUNITY): Payer: Self-pay

## 2024-02-21 ENCOUNTER — Encounter (HOSPITAL_COMMUNITY)
Admission: EM | Disposition: A | Discharge status: Rehabilitation Facility | Payer: Self-pay | Source: Home / Self Care | Attending: Neurology

## 2024-02-21 DIAGNOSIS — I63512 Cerebral infarction due to unspecified occlusion or stenosis of left middle cerebral artery: Principal | ICD-10-CM | POA: Diagnosis present

## 2024-02-21 DIAGNOSIS — R531 Weakness: Secondary | ICD-10-CM | POA: Diagnosis not present

## 2024-02-21 DIAGNOSIS — I10 Essential (primary) hypertension: Secondary | ICD-10-CM | POA: Diagnosis present

## 2024-02-21 DIAGNOSIS — Z833 Family history of diabetes mellitus: Secondary | ICD-10-CM

## 2024-02-21 DIAGNOSIS — F10239 Alcohol dependence with withdrawal, unspecified: Secondary | ICD-10-CM | POA: Diagnosis present

## 2024-02-21 DIAGNOSIS — G8191 Hemiplegia, unspecified affecting right dominant side: Secondary | ICD-10-CM | POA: Diagnosis present

## 2024-02-21 DIAGNOSIS — Z59868 Other specified financial insecurity: Secondary | ICD-10-CM

## 2024-02-21 DIAGNOSIS — Z1152 Encounter for screening for COVID-19: Secondary | ICD-10-CM

## 2024-02-21 DIAGNOSIS — Z79899 Other long term (current) drug therapy: Secondary | ICD-10-CM

## 2024-02-21 DIAGNOSIS — I69354 Hemiplegia and hemiparesis following cerebral infarction affecting left non-dominant side: Secondary | ICD-10-CM

## 2024-02-21 DIAGNOSIS — H518 Other specified disorders of binocular movement: Secondary | ICD-10-CM | POA: Diagnosis present

## 2024-02-21 DIAGNOSIS — T466X6A Underdosing of antihyperlipidemic and antiarteriosclerotic drugs, initial encounter: Secondary | ICD-10-CM

## 2024-02-21 DIAGNOSIS — N400 Enlarged prostate without lower urinary tract symptoms: Secondary | ICD-10-CM | POA: Diagnosis present

## 2024-02-21 DIAGNOSIS — Z880 Allergy status to penicillin: Secondary | ICD-10-CM | POA: Diagnosis not present

## 2024-02-21 DIAGNOSIS — I639 Cerebral infarction, unspecified: Secondary | ICD-10-CM | POA: Diagnosis not present

## 2024-02-21 DIAGNOSIS — R29725 NIHSS score 25: Secondary | ICD-10-CM | POA: Diagnosis present

## 2024-02-21 DIAGNOSIS — I779 Disorder of arteries and arterioles, unspecified: Secondary | ICD-10-CM | POA: Diagnosis not present

## 2024-02-21 DIAGNOSIS — I959 Hypotension, unspecified: Secondary | ICD-10-CM | POA: Diagnosis not present

## 2024-02-21 DIAGNOSIS — R414 Neurologic neglect syndrome: Secondary | ICD-10-CM | POA: Diagnosis not present

## 2024-02-21 DIAGNOSIS — R233 Spontaneous ecchymoses: Secondary | ICD-10-CM | POA: Diagnosis not present

## 2024-02-21 DIAGNOSIS — Z8546 Personal history of malignant neoplasm of prostate: Secondary | ICD-10-CM

## 2024-02-21 DIAGNOSIS — R001 Bradycardia, unspecified: Secondary | ICD-10-CM | POA: Diagnosis not present

## 2024-02-21 DIAGNOSIS — E785 Hyperlipidemia, unspecified: Secondary | ICD-10-CM | POA: Diagnosis present

## 2024-02-21 DIAGNOSIS — R55 Syncope and collapse: Secondary | ICD-10-CM | POA: Diagnosis not present

## 2024-02-21 DIAGNOSIS — Y906 Blood alcohol level of 120-199 mg/100 ml: Secondary | ICD-10-CM | POA: Diagnosis present

## 2024-02-21 DIAGNOSIS — R4701 Aphasia: Secondary | ICD-10-CM | POA: Diagnosis present

## 2024-02-21 DIAGNOSIS — E669 Obesity, unspecified: Secondary | ICD-10-CM | POA: Diagnosis present

## 2024-02-21 DIAGNOSIS — I69351 Hemiplegia and hemiparesis following cerebral infarction affecting right dominant side: Secondary | ICD-10-CM | POA: Diagnosis not present

## 2024-02-21 DIAGNOSIS — I69391 Dysphagia following cerebral infarction: Secondary | ICD-10-CM | POA: Diagnosis not present

## 2024-02-21 DIAGNOSIS — F1990 Other psychoactive substance use, unspecified, uncomplicated: Secondary | ICD-10-CM | POA: Diagnosis not present

## 2024-02-21 DIAGNOSIS — I618 Other nontraumatic intracerebral hemorrhage: Secondary | ICD-10-CM | POA: Diagnosis present

## 2024-02-21 DIAGNOSIS — Z791 Long term (current) use of non-steroidal anti-inflammatories (NSAID): Secondary | ICD-10-CM

## 2024-02-21 DIAGNOSIS — R7989 Other specified abnormal findings of blood chemistry: Secondary | ICD-10-CM | POA: Diagnosis not present

## 2024-02-21 DIAGNOSIS — Z91148 Patient's other noncompliance with medication regimen for other reason: Secondary | ICD-10-CM

## 2024-02-21 DIAGNOSIS — F32A Depression, unspecified: Secondary | ICD-10-CM | POA: Diagnosis present

## 2024-02-21 DIAGNOSIS — E782 Mixed hyperlipidemia: Secondary | ICD-10-CM | POA: Diagnosis present

## 2024-02-21 DIAGNOSIS — Z8249 Family history of ischemic heart disease and other diseases of the circulatory system: Secondary | ICD-10-CM | POA: Diagnosis not present

## 2024-02-21 DIAGNOSIS — I951 Orthostatic hypotension: Secondary | ICD-10-CM | POA: Diagnosis present

## 2024-02-21 DIAGNOSIS — I952 Hypotension due to drugs: Secondary | ICD-10-CM | POA: Diagnosis not present

## 2024-02-21 DIAGNOSIS — R29718 NIHSS score 18: Secondary | ICD-10-CM | POA: Diagnosis not present

## 2024-02-21 DIAGNOSIS — J449 Chronic obstructive pulmonary disease, unspecified: Secondary | ICD-10-CM | POA: Diagnosis present

## 2024-02-21 DIAGNOSIS — F17210 Nicotine dependence, cigarettes, uncomplicated: Secondary | ICD-10-CM | POA: Diagnosis present

## 2024-02-21 DIAGNOSIS — F10931 Alcohol use, unspecified with withdrawal delirium: Secondary | ICD-10-CM | POA: Diagnosis not present

## 2024-02-21 DIAGNOSIS — I69320 Aphasia following cerebral infarction: Secondary | ICD-10-CM | POA: Diagnosis not present

## 2024-02-21 DIAGNOSIS — Z8042 Family history of malignant neoplasm of prostate: Secondary | ICD-10-CM

## 2024-02-21 DIAGNOSIS — F09 Unspecified mental disorder due to known physiological condition: Secondary | ICD-10-CM | POA: Diagnosis not present

## 2024-02-21 DIAGNOSIS — M069 Rheumatoid arthritis, unspecified: Secondary | ICD-10-CM | POA: Diagnosis not present

## 2024-02-21 DIAGNOSIS — F1010 Alcohol abuse, uncomplicated: Secondary | ICD-10-CM | POA: Diagnosis not present

## 2024-02-21 DIAGNOSIS — E876 Hypokalemia: Secondary | ICD-10-CM | POA: Diagnosis present

## 2024-02-21 DIAGNOSIS — R1310 Dysphagia, unspecified: Secondary | ICD-10-CM | POA: Diagnosis present

## 2024-02-21 DIAGNOSIS — Z7982 Long term (current) use of aspirin: Secondary | ICD-10-CM

## 2024-02-21 DIAGNOSIS — I63312 Cerebral infarction due to thrombosis of left middle cerebral artery: Secondary | ICD-10-CM | POA: Diagnosis not present

## 2024-02-21 DIAGNOSIS — I6389 Other cerebral infarction: Secondary | ICD-10-CM

## 2024-02-21 DIAGNOSIS — Z720 Tobacco use: Secondary | ICD-10-CM

## 2024-02-21 DIAGNOSIS — F1410 Cocaine abuse, uncomplicated: Secondary | ICD-10-CM | POA: Diagnosis present

## 2024-02-21 DIAGNOSIS — F10939 Alcohol use, unspecified with withdrawal, unspecified: Secondary | ICD-10-CM

## 2024-02-21 DIAGNOSIS — F1210 Cannabis abuse, uncomplicated: Secondary | ICD-10-CM | POA: Diagnosis present

## 2024-02-21 DIAGNOSIS — I6522 Occlusion and stenosis of left carotid artery: Secondary | ICD-10-CM | POA: Diagnosis present

## 2024-02-21 DIAGNOSIS — E559 Vitamin D deficiency, unspecified: Secondary | ICD-10-CM | POA: Diagnosis present

## 2024-02-21 DIAGNOSIS — I69322 Dysarthria following cerebral infarction: Secondary | ICD-10-CM | POA: Diagnosis not present

## 2024-02-21 DIAGNOSIS — M4802 Spinal stenosis, cervical region: Secondary | ICD-10-CM | POA: Diagnosis present

## 2024-02-21 DIAGNOSIS — C61 Malignant neoplasm of prostate: Secondary | ICD-10-CM | POA: Diagnosis not present

## 2024-02-21 DIAGNOSIS — Z801 Family history of malignant neoplasm of trachea, bronchus and lung: Secondary | ICD-10-CM

## 2024-02-21 DIAGNOSIS — I63511 Cerebral infarction due to unspecified occlusion or stenosis of right middle cerebral artery: Secondary | ICD-10-CM | POA: Diagnosis not present

## 2024-02-21 LAB — RAPID URINE DRUG SCREEN, HOSP PERFORMED
Amphetamines: NOT DETECTED
Barbiturates: NOT DETECTED
Benzodiazepines: NOT DETECTED
Cocaine: POSITIVE — AB
Opiates: NOT DETECTED
Tetrahydrocannabinol: POSITIVE — AB

## 2024-02-21 LAB — CBC
HCT: 46.1 % (ref 39.0–52.0)
Hemoglobin: 15.4 g/dL (ref 13.0–17.0)
MCH: 31.9 pg (ref 26.0–34.0)
MCHC: 33.4 g/dL (ref 30.0–36.0)
MCV: 95.4 fL (ref 80.0–100.0)
Platelets: 258 K/uL (ref 150–400)
RBC: 4.83 MIL/uL (ref 4.22–5.81)
RDW: 14 % (ref 11.5–15.5)
WBC: 5.4 K/uL (ref 4.0–10.5)
nRBC: 0 % (ref 0.0–0.2)

## 2024-02-21 LAB — CBC WITH DIFFERENTIAL/PLATELET
Abs Immature Granulocytes: 0.02 K/uL (ref 0.00–0.07)
Basophils Absolute: 0 K/uL (ref 0.0–0.1)
Basophils Relative: 0 %
Eosinophils Absolute: 0 K/uL (ref 0.0–0.5)
Eosinophils Relative: 0 %
HCT: 42.2 % (ref 39.0–52.0)
Hemoglobin: 14.4 g/dL (ref 13.0–17.0)
Immature Granulocytes: 0 %
Lymphocytes Relative: 19 %
Lymphs Abs: 1.4 K/uL (ref 0.7–4.0)
MCH: 32.1 pg (ref 26.0–34.0)
MCHC: 34.1 g/dL (ref 30.0–36.0)
MCV: 94 fL (ref 80.0–100.0)
Monocytes Absolute: 0.6 K/uL (ref 0.1–1.0)
Monocytes Relative: 8 %
Neutro Abs: 5.4 K/uL (ref 1.7–7.7)
Neutrophils Relative %: 73 %
Platelets: 271 K/uL (ref 150–400)
RBC: 4.49 MIL/uL (ref 4.22–5.81)
RDW: 14.4 % (ref 11.5–15.5)
WBC: 7.4 K/uL (ref 4.0–10.5)
nRBC: 0 % (ref 0.0–0.2)

## 2024-02-21 LAB — COMPREHENSIVE METABOLIC PANEL WITH GFR
ALT: 23 U/L (ref 0–44)
AST: 20 U/L (ref 15–41)
Albumin: 4 g/dL (ref 3.5–5.0)
Alkaline Phosphatase: 89 U/L (ref 38–126)
Anion gap: 15 (ref 5–15)
BUN: 8 mg/dL (ref 8–23)
CO2: 18 mmol/L — ABNORMAL LOW (ref 22–32)
Calcium: 8.8 mg/dL — ABNORMAL LOW (ref 8.9–10.3)
Chloride: 105 mmol/L (ref 98–111)
Creatinine, Ser: 0.96 mg/dL (ref 0.61–1.24)
GFR, Estimated: >60 mL/min (ref >60)
Glucose, Bld: 114 mg/dL — ABNORMAL HIGH (ref 70–99)
Potassium: 3.8 mmol/L (ref 3.5–5.1)
Sodium: 138 mmol/L (ref 135–145)
Total Bilirubin: 0.2 mg/dL (ref 0.0–1.2)
Total Protein: 6.9 g/dL (ref 6.5–8.1)

## 2024-02-21 LAB — ECHOCARDIOGRAM COMPLETE
AR max vel: 2.28 cm2
AV Area VTI: 2.53 cm2
AV Area mean vel: 2.22 cm2
AV Mean grad: 6 mmHg
AV Peak grad: 13.5 mmHg
Ao pk vel: 1.84 m/s
Area-P 1/2: 3.42 cm2
S' Lateral: 3.24 cm
Single Plane A4C EF: 60.9 %
Weight: 3280.44 [oz_av]

## 2024-02-21 LAB — BASIC METABOLIC PANEL WITH GFR
Anion gap: 12 (ref 5–15)
BUN: 6 mg/dL — ABNORMAL LOW (ref 8–23)
CO2: 21 mmol/L — ABNORMAL LOW (ref 22–32)
Calcium: 8.5 mg/dL — ABNORMAL LOW (ref 8.9–10.3)
Chloride: 108 mmol/L (ref 98–111)
Creatinine, Ser: 0.95 mg/dL (ref 0.61–1.24)
GFR, Estimated: >60 mL/min (ref >60)
Glucose, Bld: 95 mg/dL (ref 70–99)
Potassium: 3.9 mmol/L (ref 3.5–5.1)
Sodium: 141 mmol/L (ref 135–145)

## 2024-02-21 LAB — I-STAT CHEM 8, ED
BUN: 9 mg/dL (ref 8–23)
Calcium, Ion: 1.15 mmol/L (ref 1.15–1.40)
Chloride: 106 mmol/L (ref 98–111)
Creatinine, Ser: 1.1 mg/dL (ref 0.61–1.24)
Glucose, Bld: 115 mg/dL — ABNORMAL HIGH (ref 70–99)
HCT: 46 % (ref 39.0–52.0)
Hemoglobin: 15.6 g/dL (ref 13.0–17.0)
Potassium: 3.8 mmol/L (ref 3.5–5.1)
Sodium: 141 mmol/L (ref 135–145)
TCO2: 21 mmol/L — ABNORMAL LOW (ref 22–32)

## 2024-02-21 LAB — DIFFERENTIAL
Abs Immature Granulocytes: 0.01 K/uL (ref 0.00–0.07)
Basophils Absolute: 0 K/uL (ref 0.0–0.1)
Basophils Relative: 0 %
Eosinophils Absolute: 0 K/uL (ref 0.0–0.5)
Eosinophils Relative: 0 %
Immature Granulocytes: 0 %
Lymphocytes Relative: 21 %
Lymphs Abs: 1.1 K/uL (ref 0.7–4.0)
Monocytes Absolute: 0.3 K/uL (ref 0.1–1.0)
Monocytes Relative: 6 %
Neutro Abs: 4 K/uL (ref 1.7–7.7)
Neutrophils Relative %: 73 %

## 2024-02-21 LAB — LIPID PANEL
Cholesterol: 159 mg/dL (ref 0–200)
HDL: 41 mg/dL (ref >40)
LDL Cholesterol: 92 mg/dL (ref 0–99)
Total CHOL/HDL Ratio: 3.9 ratio
Triglycerides: 130 mg/dL (ref <150)
VLDL: 26 mg/dL (ref 0–40)

## 2024-02-21 LAB — RESP PANEL BY RT-PCR (RSV, FLU A&B, COVID)  RVPGX2
Influenza A by PCR: NEGATIVE
Influenza B by PCR: NEGATIVE
Resp Syncytial Virus by PCR: NEGATIVE
SARS Coronavirus 2 by RT PCR: NEGATIVE

## 2024-02-21 LAB — PROTIME-INR
INR: 1 (ref 0.8–1.2)
Prothrombin Time: 13.5 s (ref 11.4–15.2)

## 2024-02-21 LAB — HEMOGLOBIN A1C
Hgb A1c MFr Bld: 5.7 % — ABNORMAL HIGH (ref 4.8–5.6)
Mean Plasma Glucose: 116.89 mg/dL

## 2024-02-21 LAB — HIV ANTIBODY (ROUTINE TESTING W REFLEX): HIV Screen 4th Generation wRfx: NONREACTIVE

## 2024-02-21 LAB — ETHANOL: Alcohol, Ethyl (B): 130 mg/dL — ABNORMAL HIGH (ref <15)

## 2024-02-21 LAB — MRSA NEXT GEN BY PCR, NASAL: MRSA by PCR Next Gen: NOT DETECTED

## 2024-02-21 LAB — CBG MONITORING, ED: Glucose-Capillary: 117 mg/dL — ABNORMAL HIGH (ref 70–99)

## 2024-02-21 LAB — APTT: aPTT: 25 s (ref 24–36)

## 2024-02-21 SURGERY — RADIOLOGY WITH ANESTHESIA
Anesthesia: General

## 2024-02-21 MED ORDER — AMLODIPINE BESYLATE 10 MG PO TABS
10.0000 mg | ORAL_TABLET | Freq: Every day | ORAL | Status: DC
  Administered 2024-02-22 – 2024-02-27 (×6): 10 mg via ORAL
  Filled 2024-02-21 (×6): qty 1

## 2024-02-21 MED ORDER — ATORVASTATIN CALCIUM 80 MG PO TABS
80.0000 mg | ORAL_TABLET | Freq: Every day | ORAL | Status: DC
  Administered 2024-02-22 – 2024-02-27 (×6): 80 mg via ORAL
  Filled 2024-02-21 (×6): qty 1

## 2024-02-21 MED ORDER — SODIUM CHLORIDE 0.9 % IV SOLN: INTRAVENOUS | Status: AC

## 2024-02-21 MED ORDER — NICOTINE 7 MG/24HR TD PT24
7.0000 mg | MEDICATED_PATCH | Freq: Every day | TRANSDERMAL | Status: DC
  Administered 2024-02-22 – 2024-02-27 (×6): 7 mg via TRANSDERMAL
  Filled 2024-02-21 (×7): qty 1

## 2024-02-21 MED ORDER — LABETALOL HCL 5 MG/ML IV SOLN
20.0000 mg | Freq: Once | INTRAVENOUS | Status: DC | PRN
  Filled 2024-02-21: qty 4

## 2024-02-21 MED ORDER — ACETAMINOPHEN 325 MG PO TABS: 650.0000 mg | ORAL_TABLET | ORAL | Status: DC | PRN

## 2024-02-21 MED ORDER — PROPOFOL 10 MG/ML IV BOLUS
INTRAVENOUS | Status: DC | PRN
  Administered 2024-02-21: 200 mg via INTRAVENOUS

## 2024-02-21 MED ORDER — ONDANSETRON HCL 4 MG/2ML IJ SOLN
INTRAMUSCULAR | Status: DC | PRN
Start: 2024-02-21 — End: 2024-02-21
  Administered 2024-02-21: 4 mg via INTRAVENOUS

## 2024-02-21 MED ORDER — ACETAMINOPHEN 650 MG RE SUPP: 650.0000 mg | RECTAL | Status: DC | PRN

## 2024-02-21 MED ORDER — IOHEXOL 300 MG/ML  SOLN
150.0000 mL | Freq: Once | INTRAMUSCULAR | Status: AC | PRN
  Administered 2024-02-21: 50 mL via INTRA_ARTERIAL

## 2024-02-21 MED ORDER — LORAZEPAM 2 MG/ML IJ SOLN
1.0000 mg | INTRAMUSCULAR | Status: DC | PRN
  Administered 2024-02-23 (×2): 2 mg via INTRAVENOUS
  Filled 2024-02-21 (×2): qty 1

## 2024-02-21 MED ORDER — ADULT MULTIVITAMIN W/MINERALS CH
1.0000 | ORAL_TABLET | Freq: Every day | ORAL | Status: DC
  Administered 2024-02-22 – 2024-02-27 (×6): 1 via ORAL
  Filled 2024-02-21 (×6): qty 1

## 2024-02-21 MED ORDER — TAMSULOSIN HCL 0.4 MG PO CAPS
0.4000 mg | ORAL_CAPSULE | Freq: Every day | ORAL | Status: DC
  Administered 2024-02-22 – 2024-02-27 (×6): 0.4 mg via ORAL
  Filled 2024-02-21 (×6): qty 1

## 2024-02-21 MED ORDER — LIDOCAINE HCL (CARDIAC) PF 100 MG/5ML IV SOSY
PREFILLED_SYRINGE | INTRAVENOUS | Status: DC | PRN
  Administered 2024-02-21: 60 mg via INTRAVENOUS

## 2024-02-21 MED ORDER — PHENYLEPHRINE HCL-NACL 20-0.9 MG/250ML-% IV SOLN
INTRAVENOUS | Status: DC | PRN
  Administered 2024-02-21: 20 ug/min via INTRAVENOUS

## 2024-02-21 MED ORDER — ALBUTEROL SULFATE (2.5 MG/3ML) 0.083% IN NEBU: 2.5000 mg | INHALATION_SOLUTION | RESPIRATORY_TRACT | Status: DC | PRN

## 2024-02-21 MED ORDER — FOLIC ACID 1 MG PO TABS
1.0000 mg | ORAL_TABLET | Freq: Every day | ORAL | Status: DC
  Administered 2024-02-22 – 2024-02-27 (×6): 1 mg via ORAL
  Filled 2024-02-21 (×6): qty 1

## 2024-02-21 MED ORDER — ESMOLOL HCL 100 MG/10ML IV SOLN
INTRAVENOUS | Status: DC | PRN
  Administered 2024-02-21: 10 mg via INTRAVENOUS

## 2024-02-21 MED ORDER — BUPROPION HCL ER (SR) 100 MG PO TB12
200.0000 mg | ORAL_TABLET | Freq: Two times a day (BID) | ORAL | Status: DC
  Administered 2024-02-22 – 2024-02-27 (×12): 200 mg via ORAL
  Filled 2024-02-21 (×16): qty 2

## 2024-02-21 MED ORDER — TENECTEPLASE FOR STROKE
0.2500 mg/kg | PACK | Freq: Once | INTRAVENOUS | Status: AC
  Administered 2024-02-21: 23 mg via INTRAVENOUS
  Filled 2024-02-21: qty 10

## 2024-02-21 MED ORDER — STROKE: EARLY STAGES OF RECOVERY BOOK
Freq: Once | Status: AC
  Filled 2024-02-21: qty 1

## 2024-02-21 MED ORDER — EZETIMIBE 10 MG PO TABS
10.0000 mg | ORAL_TABLET | Freq: Every day | ORAL | Status: DC
  Administered 2024-02-22 – 2024-02-27 (×6): 10 mg via ORAL
  Filled 2024-02-21 (×6): qty 1

## 2024-02-21 MED ORDER — PANTOPRAZOLE SODIUM 40 MG IV SOLR
40.0000 mg | Freq: Every day | INTRAVENOUS | Status: DC
  Administered 2024-02-21: 40 mg via INTRAVENOUS
  Filled 2024-02-21: qty 10

## 2024-02-21 MED ORDER — ACETAMINOPHEN 160 MG/5ML PO SOLN: 650.0000 mg | ORAL | Status: DC | PRN

## 2024-02-21 MED ORDER — THIAMINE HCL 100 MG/ML IJ SOLN
200.0000 mg | Freq: Every day | INTRAMUSCULAR | Status: DC
Start: 2024-02-21 — End: 2024-02-22

## 2024-02-21 MED ORDER — SENNOSIDES-DOCUSATE SODIUM 8.6-50 MG PO TABS: 1.0000 | ORAL_TABLET | Freq: Every evening | ORAL | Status: DC | PRN

## 2024-02-21 MED ORDER — CHLORHEXIDINE GLUCONATE CLOTH 2 % EX PADS
6.0000 | MEDICATED_PAD | Freq: Every day | CUTANEOUS | Status: DC
  Administered 2024-02-21 – 2024-02-27 (×7): 6 via TOPICAL

## 2024-02-21 MED ORDER — IOHEXOL 350 MG/ML SOLN
75.0000 mL | Freq: Once | INTRAVENOUS | Status: AC | PRN
  Administered 2024-02-21: 75 mL via INTRAVENOUS

## 2024-02-21 MED ORDER — GLYCOPYRROLATE 0.2 MG/ML IJ SOLN
INTRAMUSCULAR | Status: DC | PRN
  Administered 2024-02-21: .2 mg via INTRAVENOUS

## 2024-02-21 MED ORDER — EPHEDRINE SULFATE (PRESSORS) 50 MG/ML IJ SOLN
INTRAMUSCULAR | Status: DC | PRN
  Administered 2024-02-21 (×3): 5 mg via INTRAVENOUS

## 2024-02-21 MED ORDER — THIAMINE MONONITRATE 100 MG PO TABS
100.0000 mg | ORAL_TABLET | Freq: Every day | ORAL | Status: DC
  Administered 2024-02-22 – 2024-02-23 (×2): 100 mg via ORAL
  Filled 2024-02-21 (×2): qty 1

## 2024-02-21 MED ORDER — CLEVIDIPINE BUTYRATE 0.5 MG/ML IV EMUL: 0.0000 mg/h | INTRAVENOUS | Status: DC

## 2024-02-21 MED ORDER — LISINOPRIL 20 MG PO TABS
40.0000 mg | ORAL_TABLET | Freq: Every day | ORAL | Status: DC
  Administered 2024-02-22 – 2024-02-27 (×6): 40 mg via ORAL
  Filled 2024-02-21 (×6): qty 2

## 2024-02-21 MED ORDER — SUCCINYLCHOLINE CHLORIDE 200 MG/10ML IV SOSY
PREFILLED_SYRINGE | INTRAVENOUS | Status: DC | PRN
  Administered 2024-02-21: 140 mg via INTRAVENOUS

## 2024-02-21 NOTE — Progress Notes (Signed)
 PT Cancellation Note  Patient Details Name: Frank Fischer MRN: [id number] DOB: 16-Dec-1960   Cancelled Treatment:    Reason Eval/Treat Not Completed: (P) Active bedrest order. Will plan to follow-up as able as activity orders progress.   Theo Ferretti, PT, DPT Acute Rehabilitation Services  Office: [phone]    Theo CHRISTELLA Ferretti 02/21/2024, 8:05 AM

## 2024-02-21 NOTE — Progress Notes (Signed)
 NIR Progress Note:  POD#0 Left MCA and M1 thrombectomy (TICI 3)  Assessment and Plan: Follow-up imaging per protocol.  No activity restrictions from NIR standpoint.  Routine post evaluation and treatment. Please call with questions or concerns.  Overnight Events: Arrived from PACU around 6 am. Some modest improvement in right sided weakness.  Follows some commands. NIHSS 22 estimated.  Subjective: Unable to obtain.  Objective: Physical Exam: BP 125/72   Pulse 77   Temp 97.9 F (36.6 C)   Resp 15   Wt 93 kg   SpO2 93%   BMI 29.42 kg/m   Arouses to voice Expressive aphasia Left gaze preference Modest improvement in RUE weakness Access site with small blood stain on dressing, soft Distal extremity warm and well perfused   Current Facility-Administered Medications:    [START ON 02/22/2024]  stroke: early stages of recovery book, , Does not apply, Once, Bui, Xem M, MD   0.9 %  sodium chloride  infusion, , Intravenous, Continuous, Waddell Karna LABOR, NP, Last Rate: 40 mL/hr at 02/21/24 0629, New Bag at 02/21/24 9370   acetaminophen  (TYLENOL ) tablet 650 mg, 650 mg, Oral, Q4H PRN **OR** acetaminophen  (TYLENOL ) 160 MG/5ML solution 650 mg, 650 mg, Per Tube, Q4H PRN **OR** acetaminophen  (TYLENOL ) suppository 650 mg, 650 mg, Rectal, Q4H PRN, Bui, Xem M, MD   albuterol  (PROVENTIL ) (2.5 MG/3ML) 0.083% nebulizer solution 2.5 mg, 2.5 mg, Nebulization, Q4H PRN, Bui, Xem M, MD   amLODipine  (NORVASC ) tablet 10 mg, 10 mg, Oral, Daily, Bui, Xem M, MD   buPROPion  ER (WELLBUTRIN  SR) 12 hr tablet 200 mg, 200 mg, Oral, BID WC, Bui, Xem M, MD   Chlorhexidine Gluconate Cloth 2 % PADS 6 each, 6 each, Topical, Q0600, Bui, Xem M, MD, 6 each at 02/21/24 0628   labetalol  (NORMODYNE ) injection 20 mg, 20 mg, Intravenous, Once PRN **AND** clevidipine (CLEVIPREX) infusion 0.5 mg/mL, 0-21 mg/hr, Intravenous, Continuous, Bui, Xem M, MD   ezetimibe  (ZETIA ) tablet 10 mg, 10 mg, Oral, Daily, Bui, Xem M, MD   folic acid   (FOLVITE ) tablet 1 mg, 1 mg, Oral, Daily, Bui, Xem M, MD   lisinopril  (ZESTRIL ) tablet 40 mg, 40 mg, Oral, Daily, Bui, Xem M, MD   LORazepam  (ATIVAN ) injection 1-2 mg, 1-2 mg, Intravenous, Q1H PRN, Bui, Xem M, MD   multivitamin with minerals tablet 1 tablet, 1 tablet, Oral, Daily, Bui, Xem M, MD   nicotine (NICODERM CQ - dosed in mg/24 hr) patch 7 mg, 7 mg, Transdermal, Daily, Bui, Xem M, MD   pantoprazole (PROTONIX) injection 40 mg, 40 mg, Intravenous, QHS, Bui, Xem M, MD   senna-docusate (Senokot-S) tablet 1 tablet, 1 tablet, Oral, QHS PRN, Bui, Xem M, MD   tamsulosin  (FLOMAX ) capsule 0.4 mg, 0.4 mg, Oral, Daily, Bui, Xem M, MD   thiamine  (VITAMIN B1) injection 200 mg, 200 mg, Intravenous, Daily **OR** thiamine  (VITAMIN B1) tablet 100 mg, 100 mg, Oral, Daily, Sallyann Normie HERO, MD  Labs: CBC Recent Labs    02/21/24 0035 02/21/24 0036  WBC 5.4  --   HGB 15.4 15.6  HCT 46.1 46.0  PLT 258  --    BMET Recent Labs    02/21/24 0035 02/21/24 0036  NA 138 141  K 3.8 3.8  CL 105 106  CO2 18*  --   GLUCOSE 114* 115*  BUN 8 9  CREATININE 0.96 1.10  CALCIUM  8.8*  --    LFT Recent Labs    02/21/24 0035  PROT 6.9  ALBUMIN 4.0  AST 20  ALT 23  ALKPHOS 89  BILITOT 0.2   PT/INR Recent Labs    02/21/24 0035  LABPROT 13.5  INR 1.0    LOS: 0 days   I spent a total of 15 minutes in face to face in clinical consultation, greater than 50% of which was counseling/coordinating care.  RAY COY 02/21/2024 9:36 AM

## 2024-02-21 NOTE — Progress Notes (Signed)
 NIR Brief Note  Briefly, 63 yo male with acute ischemic stroke presenting with NIHSS > 20 and disabling symptoms.  Patient was a candidate for mechanical thrombectomy.  The patient's next of kin was not available.  We discussed the procedure (myself and Dr. Sallyann) with the patient's fiance who stated that the patient would want to have the procedure performed.  As next of kin (brother) was not available, this serves as documentation of emergent, two physician consent (myself and Dr. Sallyann).

## 2024-02-21 NOTE — H&P (Addendum)
 NEUROLOGY H&P NOTE   Date of service: February 21, 2024 Patient Name: Frank Fischer MRN:  [id number] DOB:  March 23, 1961 Chief Complaint: Left gaze, right hemiplegia - Code stroke  History of Present Illness  Frank Fischer is a 63 y.o. male with hx of multiple prior strokes (R sided, with residual mild L hemiplegia), prostate cancer (s/p brachytherapy, 2018), polysubstance use (marijuana, crack, tobacco, alcohol), HTN, depression, BPH who presents as a code stroke for left gaze & right hemiplegia.  As per fiancee, patient was normal when she saw him around 1130pm after he came back from the grocery store. He then went to the bathroom and she found him down with a left gaze and right sided weakness.   Last known well: 1130pm @ 02/20/2024 Pre-modified rankin score: 0 IV Thrombolysis: Yes Thrombectomy: Yes  ICH Score: N/A NIHSS components Score: Comment  1a Level of Conscious 0[x]  1[]  2[]  3[]      1b LOC Questions 0[]  1[]  2[x]       1c LOC Commands 0[x]  1[]  2[]       2 Best Gaze 0[]  1[]  2[x]     Forced, does not correct with VOR  3 Visual 0[]  1[]  2[x]  3[]    R hemianopia  4 Facial Palsy 0[]  1[]  2[x]  3[]      5a Motor Arm - left 0[x]  1[]  2[]  3[]  4[]  UN[]    5b Motor Arm - Right 0[]  1[]  2[]  3[]  4[x]  UN[]    6a Motor Leg - Left 0[]  1[]  2[x]  3[]  4[]  UN[]    6b Motor Leg - Right 0[]  1[]  2[]  3[]  4[x]  UN[]    7 Limb Ataxia 0[x]  1[]  2[]  UN[]      8 Sensory 0[]  1[]  2[x]  UN[]      9 Best Language 0[]  1[]  2[]  3[x]      10 Dysarthria 0[]  1[]  2[x]  UN[]      11 Extinct. and Inattention 0[x]  1[]  2[]       TOTAL: 25    Of note, patient has history of multiple prior infarcts of the R hemisphere, with residual L weakness. Last stroke was 3 years ago as per fiancee. As per Frank Fischer note on 09/12/2022, his left limb strength ranged from 4+ to 5.   CT angio (2020) showed intracranial stenosis (severe R cavernous ICA, mild to moderate L ICA, PCA.  Stroke risk factors include: HTN, tobacco use, cocaine use, history of  cancer, intracranial stenosis.    ROS  Unable to perform due to mental status  Past History   Past Medical History:  Diagnosis Date   Arthritis    Cervical stenosis of spine    Chronic headaches    Dental caries    periodontal disease   Frequency of urination    History of cerebral infarction 11-28-2017  per pt never had symptoms   per MRI imaging 10-31-2016 2 small chronic lacunar infartions in the right frontal periventricular white matter , and chronic left medial orbital fracture (as seen 2014 MRI)   Hypertension    Noncompliance with medication regimen    Prostate cancer Frank Fischer) urologist-  Frank Fischer/  oncologist-  Frank Fischer   dx 07-21-2017--- Stage T1c, Gleason 4+3,  PSA 13.40,  vol 49.7cc--  scheduled for radioactive seed implants 12-03-2017   Past Surgical History:  Procedure Laterality Date   ABDOMINAL EXPLORATION SURGERY  age 48   per pt had Small Bowel Resection and Appendectomy   APPENDECTOMY     ARM SURGERY Left 1999   REPAIR INJURY-- ARM CUT ABOUT OFF   IR  CATHETER TUBE CHANGE  10/28/2021   IR GUIDED DRAIN W CATHETER PLACEMENT  09/03/2021   IR US  GUIDANCE  09/03/2021   PROSTATE BIOPSY  07/21/2017  Frank Fischer office   RADIOACTIVE SEED IMPLANT N/A 12/03/2017   Procedure: RADIOACTIVE SEED IMPLANT/BRACHYTHERAPY IMPLANT;  Surgeon: Frank Lonni Righter, MD;  Location: [facility];  Service: Urology;  Laterality: N/A;  ONLY NEEDS 120 MIN   SPACE OAR INSTILLATION N/A 12/03/2017   Procedure: SPACE OAR INSTILLATION;  Surgeon: Frank Lonni Righter, MD;  Location[facility];  Service: Urology;  Laterality: N/A;   TOOTH EXTRACTION N/A 09/21/2018   Procedure: DENTAL EXTRACTIONS WITH ALVEOLIPLASTY;  Surgeon: Frank Fischer, DDS;  Location: [facility] OR;  Service: Oral Surgery;  Laterality: N/A;   Family History  Problem Relation Age of Onset   Lung cancer Mother    Hypertension Brother    Diabetes Paternal Grandfather    Prostate cancer Cousin     Social History   Socioeconomic History   Marital status: Single    Spouse name: Not on file   Number of children: 2   Years of education: 12   Highest education level: Not on file  Occupational History   Occupation: N/A  Tobacco Use   Smoking status: Every Day    Current packs/day: 0.25    Average packs/day: 0.3 packs/day for 40.0 years (10.0 ttl pk-yrs)    Types: Cigarettes   Smokeless tobacco: Never  Vaping Use   Vaping status: Never Used  Substance and Sexual Activity   Alcohol use: Yes    Alcohol/week: 21.0 standard drinks of alcohol    Types: 21 Cans of beer per week    Comment: 1-2 12oz  beers every evening   Drug use: Yes    Types: Marijuana    Comment:  per pt uses once week   Sexual activity: Yes  Other Topics Concern   Not on file  Social History Narrative   Lives at home alone   Right-handed   Caffeine : tea throughout the day   03/04/19 [name] is a 63 year old male who is single living at home alone in his apartment. He voices his support system is his fiance, Frank Fischer and brother Frank Fischer. He denies issues with transportation to medical appointments. He is independent in his care needs   disablity   Social Drivers of Health   Financial Resource Strain: Medium Risk (09/13/2022)   Overall Financial Resource Strain (CARDIA)    Difficulty of Paying Living Expenses: Somewhat hard  Food Insecurity: No Food Insecurity (09/13/2022)   Hunger Vital Sign    Worried About Running Out of Food in the Last Year: Never true    Ran Out of Food in the Last Year: Never true  Transportation Needs: No Transportation Needs (09/13/2022)   PRAPARE - Administrator, Civil Service (Medical): No    Lack of Transportation (Non-Medical): No  Physical Activity: Not on file  Stress: Not on file  Social Connections: Unknown (03/04/2019)   Social Connection and Isolation Panel    Frequency of Communication with Friends and Family: Three times a week     Frequency of Social Gatherings with Friends and Family: Three times a week    Attends Religious Services: 1 to 4 times per year    Active Member of Clubs or Organizations: Not on file    Attends Banker Meetings: Not on file    Marital Status: Never married  Allergies  Allergen Reactions   Penicillins Itching    Did it involve swelling of the face/tongue/throat, SOB, or low BP? No Did it involve sudden or severe rash/hives, skin peeling, or any reaction on the inside of your mouth or nose? Yes Did you need to seek medical attention at a hospital or doctor's office? No When did it last happen?      09/2018 If all above answers are "NO", may proceed with cephalosporin use.    Medications   Current Facility-Administered Medications:    [START ON 02/22/2024]  stroke: early stages of recovery book, , Does not apply, Once, Hartley Wyke M, MD   0.9 %  sodium chloride  infusion, , Intravenous, Continuous, Chantavia Bazzle M, MD, New Bag at 02/21/24 0128   acetaminophen  (TYLENOL ) tablet 650 mg, 650 mg, Oral, Q4H PRN **OR** acetaminophen  (TYLENOL ) 160 MG/5ML solution 650 mg, 650 mg, Per Tube, Q4H PRN **OR** acetaminophen  (TYLENOL ) suppository 650 mg, 650 mg, Rectal, Q4H PRN, Sallyann, Leela Vanbrocklin M, MD   labetalol  (NORMODYNE ) injection 20 mg, 20 mg, Intravenous, Once PRN **AND** clevidipine (CLEVIPREX) infusion 0.5 mg/mL, 0-21 mg/hr, Intravenous, Continuous, Adreonna Yontz M, MD   pantoprazole (PROTONIX) injection 40 mg, 40 mg, Intravenous, QHS, Treazure Nery M, MD   senna-docusate (Senokot-S) tablet 1 tablet, 1 tablet, Oral, QHS PRN, Kelly Eisler M, MD  Current Outpatient Medications:    albuterol  (VENTOLIN  HFA) 108 (90 Base) MCG/ACT inhaler, Inhale 2 puffs into the lungs every 6 (six) hours as needed for wheezing or shortness of breath., Disp: 1 each, Rfl: 3   amLODipine  (NORVASC ) 10 MG tablet, Take 1 tablet (10 mg total) by mouth daily., Disp: 30 tablet, Rfl: 3   ASPIRIN  81 PO, Take 81 mg by mouth., Disp: , Rfl:     atorvastatin  (LIPITOR ) 80 MG tablet, Take 1 tablet (80 mg total) by mouth daily., Disp: 90 tablet, Rfl: 1   buPROPion  (WELLBUTRIN  SR) 200 MG 12 hr tablet, Take 1 tablet (200 mg total) by mouth 2 (two) times daily., Disp: 180 tablet, Rfl: 1   ezetimibe  (ZETIA ) 10 MG tablet, Take 1 tablet (10 mg total) by mouth daily., Disp: 90 tablet, Rfl: 3   lisinopril  (ZESTRIL ) 40 MG tablet, Take 1 tablet (40 mg total) by mouth every morning., Disp: 90 tablet, Rfl: 1   naproxen  (NAPROSYN ) 375 MG tablet, Take 1 tablet (375 mg total) by mouth 2 (two) times daily with a meal., Disp: 10 tablet, Rfl: 0   tamsulosin  (FLOMAX ) 0.4 MG CAPS capsule, Take 1 capsule (0.4 mg total) by mouth daily., Disp: 30 capsule, Rfl: 11   triamcinolone  cream (KENALOG ) 0.1 %, Apply 1 Application topically 2 (two) times daily., Disp: 30 g, Rfl: 0   Vitals   Vitals:   02/21/24 0101 02/21/24 0115 02/21/24 0130 02/21/24 0258  BP: (!) 147/82 (!) 158/85 (!) 165/83 (!) 145/79  Pulse: 63 61 (!) 50 72  Resp: (!) 23 (!) 24 18   SpO2: 100% 98% 99%   Weight:         Body mass index is 29.42 kg/m.  Physical Exam   Constitutional: Appears well-developed. Psych: Flat Eyes: Bilateral scleral injection.  HENT: No OP obstruction.  Head: Normocephalic.  Cardiovascular: Normal rate and regular rhythm.  Respiratory: Effort normal, non-labored breathing.  GI: Soft.   Skin: WDI.   Neurologic Examination   Mental status: alert, eyes open widely Speech: mute Cranial nerves: PERRL Conjugate gaze with forced deviation to the left Does not blink to threat with stimulus  coming in from the right Face with droop on the right Motor: Normal bulk and tone. RUE: 0/5, does not move to nailbed LUE: at least 3/5, spontaneous movement RLE: 0/5, does not move to nailbed LLE: at least 3/5, spontaneous movement Sensory: As above Reflexes: DTR's deferred. Toes are mute bilaterally Coordination: UTA Gait: deferred   Labs   CBC:  Recent Labs   Lab 02/21/24 0035 02/21/24 0036  WBC 5.4  --   NEUTROABS 4.0  --   HGB 15.4 15.6  HCT 46.1 46.0  MCV 95.4  --   PLT 258  --    Basic Metabolic Panel:  Lab Results  Component Value Date   NA 141 02/21/2024   K 3.8 02/21/2024   CO2 18 (L) 02/21/2024   GLUCOSE 115 (H) 02/21/2024   BUN 9 02/21/2024   CREATININE 1.10 02/21/2024   CALCIUM  8.8 (L) 02/21/2024   GFRNONAA >60 02/21/2024   GFRAA >60 06/15/2019   Lipid Panel:  Lab Results  Component Value Date   LDLCALC 111 (H) 07/22/2023   HgbA1c:  Lab Results  Component Value Date   HGBA1C 6.1 (H) 07/22/2023   Urine Drug Screen:     Component Value Date/Time   LABOPIA NONE DETECTED 02/26/2019 0028   COCAINSCRNUR POSITIVE (A) 02/26/2019 0028   COCAINSCRNUR NEG 06/21/2013 0000   LABBENZ NONE DETECTED 02/26/2019 0028   LABBENZ NEG 06/21/2013 0000   AMPHETMU NONE DETECTED 02/26/2019 0028   THCU POSITIVE (A) 02/26/2019 0028   LABBARB NONE DETECTED 02/26/2019 0028    Alcohol Level     Component Value Date/Time   ETH 130 (H) 02/21/2024 0035   INR  Lab Results  Component Value Date   INR 1.0 02/21/2024   APTT  Lab Results  Component Value Date   APTT 25 02/21/2024     CT Head without contrast(Personally reviewed): Hypodensity in R frontal, bilateral parietal lobes. Hyperdense L MCA.   CT angio Head and Neck with contrast(Personally reviewed): L ICA and M1 occlusion.   MRI Brain(Personally reviewed): Pending.   Assessment   Frank Fischer is a 63 y.o. male with hx of multiple prior strokes (R sided, with residual mild L hemiplegia), prostate cancer (s/p brachytherapy, 2018), polysubstance use (marijuana, crack, tobacco, alcohol), HTN, depression, BPH who presents as a code stroke for left gaze deviation, right sided weakness, and global aphasia. Found to have L ICA and M1 occlusion. S/p TICI 3 x3 passes.    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    Secondary Diagnosis: Essential (primary) hypertension and Obesity  Plan  Cerebrovascular\Neuro  Stroke:  left ICA, MCA Etiology:  Atherosclerosis Code Stroke CT head showed hyperdense L MCA. ASPECTS 8. CTA head & neck showed L ICA and M1 occlusion. CT perfusion n/a as within 6 hours MRI  pending 2D Echo pending Telemetry LDL 111 HgbA1c 6.1 VTE prophylaxis - SCD, no enoxaparin  within 24 hours of TNK (given 02/21/24 @ 0045)    Diet   Diet NPO time specified   aspirin  81 mg daily prior to admission, now on No antithrombotic for 24 hours after TNK. ASA 81mg  afterward. Therapy recommendations:  TBD Disposition:  TBD  Hypertension Home meds:  Amlo 10, lisinopril  40 SBP 120-160 after thrombectomy Long-term BP goal normotensive  Hyperlipidemia Home meds:  ator 80, zetia  10mg , resumed in hospital LDL 111, goal < 70 Continue statin at discharge  Pre-diabetes HgbA1c 6.1, goal < 7.0  Tobacco  use Smokes half a pack per day Nicotine 7mg  patch  Alcohol use Drinks 6 beers a day during the week & more over the weekend No history of delirium tremens or withdrawal seizures CIWA  MVI, folate, thiamine   Depression Home buproprion 200mg  BID  BPH Tamsulosin  0.4mg   COPD Albuterol  PRN O2 sats 88-92%  Other Stroke Risk Factors Cigarette smoker: will need counseling for cessation ETOH use, alcohol level 130: drinks a 6-pack daily during the week and more over the weekend, will advise to drink no more than 2 drink(s) a day Substance abuse - marijuana daily & cocaine 2-3/month (last use 02/20/24). Will advise patient to stop using due to stroke risk. Obesity, Body mass index is 29.42 kg/m., BMI >/= 30 associated with increased stroke risk, recommend weight loss, diet and exercise as appropriate  Hx stroke/TIA    Cardiovascular  -Blood pressure goal as above  Respiratory   Hematology  - platelet 258 - H/H 15.4/46.1  ID\Immunology  -WBC 5.4  Renal  -Cr  0.96  Endocrine\Metabolic   GI\Nutrition  -Diet: NPO -Bowel Regimen: PRN senna  Fluid\Electrolytes  -IVF:  PRN -Sodium Goal: Normal -Electrolyte Replacement: PRN  Prophylaxis\Quality\Safety:  -Venous Thrombosis: SCD's for now until 24 hours after TNK -GI: omeprazole (home)  Code Status: Full Care Level: ICU Disposition:  TBD ______________________________________________________________________   Signed, Vinod Mikesell M Kallista Pae, MD [facility]  Risks, benefits and alternatives of IVT discussed with fiancee who believes patient would want to receive IVT. Unable to contact brother. Two physician agreement with Frank. Lonni Seats (ED physician) to proceed.  Two physician agreement with Frank. Maude Naegeli after discussion with fiancee to proceed with thrombectomy. CTH personally reviewed prior to TNK administration.

## 2024-02-21 NOTE — Progress Notes (Signed)
 eLink Physician-Brief Progress Note Patient Name: LAYTON NAVES DOB: [birth year] MRN: [id number]   Date of Service  02/21/2024  HPI/Events of Note  63 year old male with a history of strokes, cancer, polysubstances who presents with left gaze deviation and  right hemiplegia found to have L left M1 occlusion status post IR thrombectomy.  TNK administered at 0045  Vitals and imaging reviewed  eICU Interventions  Standard tenecteplase precautions were 24 hours  Tight blood pressure control, goal less than 160 per IR, clevidipine and labetalol  in place  Interval imaging per neurology, echo, MRI  PMNR evaluations  DVT prophylaxis with SCDs GI prophylaxis not indicated     Intervention Category Evaluation Type: New Patient Evaluation  Tremont Gavitt 02/21/2024, 6:44 AM

## 2024-02-21 NOTE — ED Notes (Signed)
 EMS vitals  Bp 148/86 Pulse 55 Spo2 96% Cbg 111

## 2024-02-21 NOTE — Progress Notes (Addendum)
   02/21/24 1951  Provider Notification  Provider Name/Title X. Bui MD  Date Provider Notified 02/21/24  Time Provider Notified 1952  Method of Notification Page  Notification Reason Change in status (HR keeps dropping to 39 bpm for about 5-10 seconds. Neuro exam unchanged, BP stable)    Call received from [facility] MD @ 2000 - continue to monitor, no new orders at this time. Noted that pt has hx of regular cocaine abuse.

## 2024-02-21 NOTE — ED Provider Notes (Addendum)
 [name] Provider Note   CSN: [id number] Arrival date & time: 02/21/24  0030  An emergency department physician performed an initial assessment on this suspected stroke patient at 0030.  Patient presents with: Code Stroke   Frank Fischer is a 63 y.o. male.   Patient brought to the emergency department for evaluation of possible stroke.  He comes to the emergency department by EMS.  Information provided by EMS, patient is noncommunicative.  Patient reportedly came home from the store around 11:30 PM and was fine according to his wife.  At some point after that she found him on the floor in the bathroom.  EMS reports that he has a right sided weakness where left-sided gaze preference, is not speaking.       Prior to Admission medications   Medication Sig Start Date End Date Taking? Authorizing Provider  albuterol  (VENTOLIN  HFA) 108 (90 Base) MCG/ACT inhaler Inhale 2 puffs into the lungs every 6 (six) hours as needed for wheezing or shortness of breath. 07/22/23   Ngetich, Dinah C, NP  amLODipine  (NORVASC ) 10 MG tablet Take 1 tablet (10 mg total) by mouth daily. 07/22/23   Ngetich, Dinah C, NP  ASPIRIN  81 PO Take 81 mg by mouth.    [provider]  atorvastatin  (LIPITOR ) 80 MG tablet Take 1 tablet (80 mg total) by mouth daily. 07/22/23   Ngetich, Dinah C, NP  buPROPion  (WELLBUTRIN  SR) 200 MG 12 hr tablet Take 1 tablet (200 mg total) by mouth 2 (two) times daily. 07/22/23   Ngetich, Dinah C, NP  ezetimibe  (ZETIA ) 10 MG tablet Take 1 tablet (10 mg total) by mouth daily. 08/07/23   Ngetich, Dinah C, NP  lisinopril  (ZESTRIL ) 40 MG tablet Take 1 tablet (40 mg total) by mouth every morning. 07/22/23   Ngetich, Dinah C, NP  naproxen  (NAPROSYN ) 375 MG tablet Take 1 tablet (375 mg total) by mouth 2 (two) times daily with a meal. 01/10/24   Horton, Kristie M, DO  tamsulosin  (FLOMAX ) 0.4 MG CAPS capsule Take 1 capsule (0.4 mg total) by mouth daily. 07/22/23    Ngetich, Dinah C, NP  triamcinolone  cream (KENALOG ) 0.1 % Apply 1 Application topically 2 (two) times daily. 07/22/23   Ngetich, Dinah C, NP    Allergies: Penicillins    Review of Systems  Updated Vital Signs BP (!) 158/85   Pulse 61   Resp (!) 24   Wt 93 kg   SpO2 98%   BMI 29.42 kg/m   Physical Exam Vitals and nursing note reviewed.  Eyes:     Comments: Leftward gaze deviation, will not cross midline  Cardiovascular:     Rate and Rhythm: Normal rate and regular rhythm.  Pulmonary:     Effort: Pulmonary effort is normal.     Breath sounds: Normal breath sounds.  Abdominal:     Palpations: Abdomen is soft.  Skin:    General: Skin is warm.  Neurological:     Cranial Nerves: Cranial nerve deficit present.     Motor: Weakness present.     (all labs ordered are listed, but only abnormal results are displayed) Labs Reviewed  ETHANOL - Abnormal; Notable for the following components:      Result Value   Alcohol, Ethyl (B) 130 (*)    All other components within normal limits  COMPREHENSIVE METABOLIC PANEL WITH GFR - Abnormal; Notable for the following components:   CO2 18 (*)    Glucose,  Bld 114 (*)    Calcium  8.8 (*)    All other components within normal limits  I-STAT CHEM 8, ED - Abnormal; Notable for the following components:   Glucose, Bld 115 (*)    TCO2 21 (*)    All other components within normal limits  CBG MONITORING, ED - Abnormal; Notable for the following components:   Glucose-Capillary 117 (*)    All other components within normal limits  RESP PANEL BY RT-PCR (RSV, FLU A&B, COVID)  RVPGX2  PROTIME-INR  APTT  CBC  DIFFERENTIAL  RAPID URINE DRUG SCREEN, HOSP PERFORMED  HIV ANTIBODY (ROUTINE TESTING W REFLEX)  LIPID PANEL  HEMOGLOBIN A1C    EKG: EKG Interpretation Date/Time:  Saturday February 21 2024 01:06:36 EDT Ventricular Rate:  66 PR Interval:  160 QRS Duration:  99 QT Interval:  422 QTC Calculation: 443 R Axis:   72  Text  Interpretation: Sinus rhythm Borderline ST elevation, lateral leads Confirmed by Haze Lonni PARAS [phone]) on 02/21/2024 1:12:09 AM  Radiology: CT HEAD CODE STROKE WO CONTRAST Result Date: 02/21/2024 EXAM: CT HEAD WITHOUT CONTRAST 02/21/2024 12:40:34 AM TECHNIQUE: CT of the head was performed without the administration of intravenous contrast. Automated exposure control, iterative reconstruction, and/or weight based adjustment of the mA/kV was utilized to reduce the radiation dose to as low as reasonably achievable. COMPARISON: 02/26/2019 CLINICAL HISTORY: Neuro deficit, acute, stroke suspected. Stroke, Rt side weakness. FINDINGS: BRAIN AND VENTRICLES: No acute hemorrhage. There are old infarcts of the right MCA and PCA territory. There is a focal area of loss of gray-white differentiation in the posterior left MCA territory. ASPECTS is 8. Hyperdense left MCA. No hydrocephalus, extra-axial collection, mass effect, or midline shift. ASPECTS ([place] stroke program early CT score) - ganglionic level infarction (caudate, lentiform nuclei, internal capsule, insula, M1-m3 cortex): 6 - supraganglionic infarction (m4-m6 cortex): 2 Total score (0-10 with 10 being normal): 8 ORBITS: No acute abnormality. SINUSES: No acute abnormality. SOFT TISSUES AND SKULL: No acute soft tissue abnormality. No skull fracture. IMPRESSION: 1. Acute/early subacute left MCA territory infarct with focal loss of gray-white differentiation (ASPECTS 8) and hyperdense left MCA. 2. No intracranial hemorrhage. 3. 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 Findings communicated to Dr. MAGDALENO Blower via [facility] text page at 12:50 AM on 02/21/24. Electronically signed by: Franky Stanford MD 02/21/2024 12:52 AM EDT RP Workstation: [id number]     Procedures   Medications Ordered in the ED   stroke: early stages of recovery book (has no administration in time range)  0.9 %  sodium chloride  infusion (has no administration in time range)   acetaminophen  (TYLENOL ) tablet 650 mg (has no administration in time range)    Or  acetaminophen  (TYLENOL ) 160 MG/5ML solution 650 mg (has no administration in time range)    Or  acetaminophen  (TYLENOL ) suppository 650 mg (has no administration in time range)  senna-docusate (Senokot-S) tablet 1 tablet (has no administration in time range)  pantoprazole (PROTONIX) injection 40 mg (has no administration in time range)  labetalol  (NORMODYNE ) injection 20 mg (has no administration in time range)    And  clevidipine (CLEVIPREX) infusion 0.5 mg/mL (has no administration in time range)  tenecteplase (TNKASE) injection for Stroke 23 mg (23 mg Intravenous Given 02/21/24 0045)                                    Medical Decision Making Amount  and/or Complexity of Data Reviewed Labs: ordered. Radiology: ordered.  Risk Decision regarding hospitalization.   Differential diagnosis considered includes, but not limited to:  TIA; Stroke; seizure; metabolic encephalopathy  Patient presents to the emergency department for evaluation of stroke symptoms.  Patient arrives approximately an hour after last known normal.  Upon arrival to the emergency department, patient with left gaze right hemiparesis and aphasia.  Patient arrives as a code stroke, evaluated in conjunction with neurology.  Patient met at the bridge and evaluated.  He was felt to be protecting his airway, appropriate for imaging.  Patient with hyperdense MCA consistent with current presentation, administered TNK by neurology.  Patient was unable to give consent because of aphasia.  Dr. Sallyann and I both agree that the patient required emergent TNK.  Patient will be admitted by neurology service, taken to IR prior to admission for treatment of MCA clot.  CRITICAL CARE Performed by: Lonni JINNY Seats   Total critical care time: 30 minutes  Critical care time was exclusive of separately billable procedures and treating other  patients.  Critical care was necessary to treat or prevent imminent or life-threatening deterioration.  Critical care was time spent personally by me on the following activities: development of treatment plan with patient and/or surrogate as well as nursing, discussions with consultants, evaluation of patient's response to treatment, examination of patient, obtaining history from patient or surrogate, ordering and performing treatments and interventions, ordering and review of laboratory studies, ordering and review of radiographic studies, pulse oximetry and re-evaluation of patient's condition.      Final diagnoses:  Cerebrovascular accident (CVA), unspecified mechanism [facility])    ED Discharge Orders     None          Damesha Lawler, Lonni JINNY, MD 02/21/24 [id number]    Seats Lonni JINNY, MD 02/21/24 [phone]

## 2024-02-21 NOTE — ED Notes (Signed)
 Pt transported to IR

## 2024-02-21 NOTE — Transfer of Care (Signed)
 Immediate Anesthesia Transfer of Care Note  Patient: Frank Fischer  Procedure(s) Performed: RADIOLOGY WITH ANESTHESIA  Patient Location: PACU  Anesthesia Type:General  Level of Consciousness: drowsy  Airway & Oxygen Therapy: Patient Spontanous Breathing and Patient connected to face mask oxygen  Post-op Assessment: Report given to RN and Post -op Vital signs reviewed and stable  Post vital signs: Reviewed and stable  Last Vitals:  Vitals Value Taken Time  BP    Temp    Pulse    Resp    SpO2      Last Pain: There were no vitals filed for this visit.       Complications: No notable events documented.

## 2024-02-21 NOTE — Progress Notes (Signed)
 OT Cancellation Note  Patient Details Name: Frank Fischer MRN: [id number] DOB: 1961-01-23   Cancelled Treatment:    Reason Eval/Treat Not Completed: Active bedrest order (s/p IR for thrombectomy with strict bedrest beginning 10/04 at 0045. will follow up as medically ready.)  Frank Fischer, OTD, OTR/L [facility] Acute Rehabilitation Office: [phone]   Frank Fischer 02/21/2024, 7:19 AM

## 2024-02-21 NOTE — ED Triage Notes (Signed)
 Pt coming in from home where he was last known well at 2330  Pt has a history of prior stroke. Pt wife reports that pt went to the store at 2300 last night at 2330 pt startted to have stroke symptoms

## 2024-02-21 NOTE — Progress Notes (Signed)
  Echocardiogram 2D Echocardiogram has been performed.  Koleen KANDICE Popper, RDCS 02/21/2024, 4:12 PM

## 2024-02-21 NOTE — Progress Notes (Signed)
 PHARMACIST CODE STROKE RESPONSE  Notified to mix TNK at 0043 by Dr. Sallyann TNK preparation completed at 0045  TNK dose = 23 mg (4.6 mL) IV over 5 seconds  Issues/delays encountered (if applicable):  Patient unable to provide consent MD spoke with wife for consent  Lynwood Mckusick, PharmD, BCPS Clinical Pharmacist Phone: [phone]

## 2024-02-21 NOTE — Procedures (Signed)
  NEUROSURGERY BRIEF THROMBECTOMY NOTE   PREOP DX: Acute ischemic stroke  POSTOP DX: Same  PROCEDURE: Left carotid terminus and M1 thrombectomy  SURGEON: Jamiah Homeyer   ANESTHESIA: GETA  EBL: Minimal  Number of Passes: 3  Technique: ASPIRATION WITH STENT RETRIEVER  Final TICI score: 3  Post OP blood pressure goal: SBP<160  Arterial Angioplasty or Stent: No   Anti-Platelet Therapy: No   COMPLICATIONS: No   CONDITION: Stable to recovery  FINDINGS (Full report in [facility]): 1. Left carotid terminus and M1 occlusion treated with combination of stent-retriever and suction thrombectomy.   Frank Fischer  @today @ 2:39 AM

## 2024-02-21 NOTE — Code Documentation (Addendum)
 Responded to Code Stroke called at 0019 for R sided weakness, L gaze, and aphasia, [id number]. Pt arrived at 0030, CBG-117, NIH-25, CT head-Acute/early subacute left MCA territory infarct and hyperdense left MCA, no intracranial hemorrhage. TNK given at 0045, CTA- L MCA occlusion. IR paged out at 0114. Pt transported to IR, arriving at 0124.

## 2024-02-21 NOTE — Progress Notes (Signed)
 Patient has been SB all shift, ranging from 48-59). Within the last 20 minutes patient started dropping to 37-38 and sustaining. MD paged, awaiting response.   With stimulation patients HR increases to 48.

## 2024-02-21 NOTE — Progress Notes (Addendum)
 STROKE TEAM PROGRESS NOTE    SIGNIFICANT HOSPITAL EVENTS 10/3 presented as code stroke for left gaze and right hemiplegia received IV TNK and underwent left carotid terminus and M1 occlusion with TICI 3.  Admitted to the ICU  INTERIM HISTORY/SUBJECTIVE  On rounds this morning no family was present. Neurological exam he is globally aphasic-makes noise with no real speech output, left gaze preference right hemianopia, no blink on the right does not cross midline some minimal movement on the right side, with trace drawl of right leg purposeful on the left  Will increase IV fluids to 75 cc Will place patient on CIWA protocol  Family was updated in the afternoon by Dr.Cohan Stipes and all questions answered Patient scheduled for 24-hour brain imaging overnight  CBC    Component Value Date/Time   WBC 7.4 02/21/2024 0834   RBC 4.49 02/21/2024 0834   HGB 14.4 02/21/2024 0834   HGB 15.7 10/17/2016 1618   HCT 42.2 02/21/2024 0834   HCT 46.9 10/17/2016 1618   PLT 271 02/21/2024 0834   PLT 241 10/17/2016 1618   MCV 94.0 02/21/2024 0834   MCV 95 10/17/2016 1618   MCH 32.1 02/21/2024 0834   MCHC 34.1 02/21/2024 0834   RDW 14.4 02/21/2024 0834   RDW 15.6 (H) 10/17/2016 1618   LYMPHSABS 1.4 02/21/2024 0834   MONOABS 0.6 02/21/2024 0834   EOSABS 0.0 02/21/2024 0834   BASOSABS 0.0 02/21/2024 0834    BMET    Component Value Date/Time   NA 141 02/21/2024 0834   NA 144 10/17/2016 1618   K 3.9 02/21/2024 0834   CL 108 02/21/2024 0834   CO2 21 (L) 02/21/2024 0834   GLUCOSE 95 02/21/2024 0834   BUN 6 (L) 02/21/2024 0834   BUN 14 10/17/2016 1618   CREATININE 0.95 02/21/2024 0834   CREATININE 1.16 07/22/2023 1434   CALCIUM  8.5 (L) 02/21/2024 0834   EGFR 70 07/22/2023 1434   GFRNONAA >60 02/21/2024 0834   GFRNONAA 88 06/21/2013 1559    IMAGING past 24 hours IR PERCUTANEOUS ART THROMBECTOMY/INFUSION INTRACRANIAL INC DIAG ANGIO Result Date: 02/21/2024 PROCEDURE PERFORMED: 1. Cerebral  angiography with stroke thrombectomy 2. Ultrasound guided vascular access 3. Cone beam CT for treatment planning COMPARISON:  CT angiogram of the head and neck performed February 21, 2024 CLINICAL DATA:  63 year old male with acute ischemic stroke and symptoms of right-sided plegia, aphasia, and gaze preference. NIH stroke scale measures 25. Patient was a candidate for thrombolysis and also mechanical thrombectomy and presented to interventional radiology for treatment. INDICATION: Acute ischemic stroke. ANESTHESIA/SEDATION: General anesthesia was utilized for the procedure. CONTRAST:  Approximately 50 cc Ominipque 300 MEDICATIONS: See MAR FLUOROSCOPY TIME:  Fluoroscopy Time: 12 minutes, (628 mGy). COMPLICATIONS: None immediate. BODY OF REPORT: Following a full explanation of the procedure along with the potential associated complications, an informed witnessed consent was obtained. The patient was then placed under general anesthesia by the Department of Anesthesiology at [facility]. The right femoral access site was prepped and draped in the usual sterile fashion. Ultrasound was used to study the right common femoral artery which was patent. Using real-time ultrasound guidance, a 19 gauge introducer needle was used to access the right common femoral artery. Access was performed at 01:49. A hard copy image ultrasound the saved and stored in PACS. Using this access, a 6 French sheath was placed in the descending thoracic aorta. Next, selective catheterization the left internal carotid artery was performed. A selective arteriogram was performed which demonstrated  occlusion of the left internal carotid artery in the distal segment. Pretreatment TICI score 0. Next, red 72 was advanced into the distal left ICA. The first pass was performed at 0203. This resulted in partial extraction of thrombus from the carotid terminus. Next, selective catheterization of the left M1 segment was performed and an EMBO trap was  deployed within the left M1 segment and distal ICA. This resulted in partial extraction of the left M1 thrombus and the residual thrombus within the distal left ICA. This pass was performed at 02:15. Finally, a third pass was performed using a penumbra 43 catheter into a proximal left M2 branch occlusion which successfully yielded thrombus. This pass was performed at 02:25. There was no significant residual thromboembolic disease. Mild plaque was present within the distal left intracranial ICA. A mild stenosis is also present in the proximal left ICA origin which was estimated at less than 50% based on NASCET criteria. Post treatment TICI score 3. After reviewing the imaging, I elected to terminate the procedure at this point. Evaluation of the right femoral access site demonstrated that the site was suitable for a closure device. A 6 French Angio-Seal device was deployed without complication. Cone beam CT was then performed to evaluate for intracranial hemorrhage and treatment planning. This demonstrated minimal contrast staining. The patient was removed from anesthesia and transferred to recovery in stable condition. IMPRESSION: 1. Suction and stent retriever assisted thrombectomy of left carotid terminus and M1 occlusion. 2. Post-treatment TICI score = 3. PLAN: 1. To ICU for routine postoperative supportive care. Electronically Signed   By: Maude Naegeli M.D.   On: 02/21/2024 09:15   IR US  Guide Vasc Access Right Result Date: 02/21/2024 PROCEDURE PERFORMED: 1. Cerebral angiography with stroke thrombectomy 2. Ultrasound guided vascular access 3. Cone beam CT for treatment planning COMPARISON:  CT angiogram of the head and neck performed February 21, 2024 CLINICAL DATA:  63 year old male with acute ischemic stroke and symptoms of right-sided plegia, aphasia, and gaze preference. NIH stroke scale measures 25. Patient was a candidate for thrombolysis and also mechanical thrombectomy and presented to interventional  radiology for treatment. INDICATION: Acute ischemic stroke. ANESTHESIA/SEDATION: General anesthesia was utilized for the procedure. CONTRAST:  Approximately 50 cc Ominipque 300 MEDICATIONS: See MAR FLUOROSCOPY TIME:  Fluoroscopy Time: 12 minutes, (628 mGy). COMPLICATIONS: None immediate. BODY OF REPORT: Following a full explanation of the procedure along with the potential associated complications, an informed witnessed consent was obtained. The patient was then placed under general anesthesia by the Department of Anesthesiology at [facility]. The right femoral access site was prepped and draped in the usual sterile fashion. Ultrasound was used to study the right common femoral artery which was patent. Using real-time ultrasound guidance, a 19 gauge introducer needle was used to access the right common femoral artery. Access was performed at 01:49. A hard copy image ultrasound the saved and stored in PACS. Using this access, a 6 French sheath was placed in the descending thoracic aorta. Next, selective catheterization the left internal carotid artery was performed. 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 Pretreatment TICI score 0. Next, red 72 was advanced into the distal left ICA. The first pass was performed at 0203. This resulted in partial extraction of thrombus from the carotid terminus. Next, selective catheterization of the left M1 segment was performed and an EMBO trap was deployed within the left M1 segment and distal ICA. This resulted  in partial extraction of the left M1 thrombus and the residual thrombus within the distal left ICA. This pass was performed at 02:15. Finally, a third pass was performed using a penumbra 43 catheter into a proximal left M2 branch occlusion which successfully yielded thrombus. This pass was performed at 02:25. There was no significant residual thromboembolic disease. Mild plaque was  present within the distal left intracranial ICA. A mild stenosis is also present in the proximal left ICA origin which was estimated at less than 50% based on NASCET criteria. Post treatment TICI score 3. After reviewing the imaging, I elected to terminate the procedure at this point. Evaluation of the right femoral access site demonstrated that the site was suitable for a closure device. A 6 French Angio-Seal device was deployed without complication. Cone beam CT was then performed to evaluate for intracranial hemorrhage and treatment planning. This demonstrated minimal contrast staining. The patient was removed from anesthesia and transferred to recovery in stable condition. IMPRESSION: 1. Suction and stent retriever assisted thrombectomy of left carotid terminus and M1 occlusion. 2. Post-treatment TICI score = 3. PLAN: 1. To ICU for routine postoperative supportive care. Electronically Signed   By: Maude Naegeli M.D.   On: 02/21/2024 09:15   IR CT Head Ltd Result Date: 02/21/2024 PROCEDURE PERFORMED: 1. Cerebral angiography with stroke thrombectomy 2. Ultrasound guided vascular access 3. Cone beam CT for treatment planning COMPARISON:  CT angiogram of the head and neck performed February 21, 2024 CLINICAL DATA:  63 year old male with acute ischemic stroke and symptoms of right-sided plegia, aphasia, and gaze preference. NIH stroke scale measures 25. Patient was a candidate for thrombolysis and also mechanical thrombectomy and presented to interventional radiology for treatment. INDICATION: Acute ischemic stroke. ANESTHESIA/SEDATION: General anesthesia was utilized for the procedure. CONTRAST:  Approximately 50 cc Ominipque 300 MEDICATIONS: See MAR FLUOROSCOPY TIME:  Fluoroscopy Time: 12 minutes, (628 mGy). COMPLICATIONS: None immediate. BODY OF REPORT: Following a full explanation of the procedure along with the potential associated complications, an informed witnessed consent was obtained. The patient was then  placed under general anesthesia by the Department of Anesthesiology at [facility]. The right femoral access site was prepped and draped in the usual sterile fashion. Ultrasound was used to study the right common femoral artery which was patent. Using real-time ultrasound guidance, a 19 gauge introducer needle was used to access the right common femoral artery. Access was performed at 01:49. A hard copy image ultrasound the saved and stored in PACS. Using this access, a 6 French sheath was placed in the descending thoracic aorta. Next, selective catheterization the left internal carotid artery was performed. 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 Pretreatment TICI score 0. Next, red 72 was advanced into the distal left ICA. The first pass was performed at 0203. This resulted in partial extraction of thrombus from the carotid terminus. Next, selective catheterization of the left M1 segment was performed and an EMBO trap was deployed within the left M1 segment and distal ICA. This resulted in partial extraction of the left M1 thrombus and the residual thrombus within the distal left ICA. This pass was performed at 02:15. Finally, a third pass was performed using a penumbra 43 catheter into a proximal left M2 branch occlusion which successfully yielded thrombus. This pass was performed at 02:25. There was no significant residual thromboembolic disease. Mild plaque was present within the distal left intracranial ICA. A mild stenosis  is also present in the proximal left ICA origin which was estimated at less than 50% based on NASCET criteria. Post treatment TICI score 3. After reviewing the imaging, I elected to terminate the procedure at this point. Evaluation of the right femoral access site demonstrated that the site was suitable for a closure device. A 6 French Angio-Seal device was deployed without complication. Cone beam  CT was then performed to evaluate for intracranial hemorrhage and treatment planning. This demonstrated minimal contrast staining. The patient was removed from anesthesia and transferred to recovery in stable condition. IMPRESSION: 1. Suction and stent retriever assisted thrombectomy of left carotid terminus and M1 occlusion. 2. Post-treatment TICI score = 3. PLAN: 1. To ICU for routine postoperative supportive care. Electronically Signed   By: Maude Naegeli M.D.   On: 02/21/2024 09:15   CT C-SPINE NO CHARGE Result Date: 02/21/2024 EXAM: CT CERVICAL SPINE WITHOUT CONTRAST 02/21/2024 01:19:35 AM TECHNIQUE: CT of the cervical spine was performed without the administration of intravenous contrast. Multiplanar reformatted images are provided for review. Automated exposure control, iterative reconstruction, and/or weight based adjustment of the mA/kV was utilized to reduce the radiation dose to as low as reasonably achievable. COMPARISON: None available. CLINICAL HISTORY: Neck trauma (Age >= 65y); Neck trauma, intoxicated or obtunded (Age >= 16y). FINDINGS: BONES AND ALIGNMENT: No acute fracture or traumatic malalignment. DEGENERATIVE CHANGES: No significant degenerative changes. SOFT TISSUES: No prevertebral soft tissue swelling. IMPRESSION: 1. No acute abnormality of the cervical spine. Electronically signed by: Franky Stanford MD 02/21/2024 01:33 AM EDT RP Workstation: [id number]   CT ANGIO HEAD NECK W WO CM (CODE STROKE) Result Date: 02/21/2024 EXAM: CTA HEAD AND NECK WITH AND WITHOUT 02/21/2024 01:18:14 AM TECHNIQUE: CTA of the head and neck was performed with and without the administration of 75 mL of intravenous iohexol  (OMNIPAQUE ) 350 MG/ML injection. Multiplanar 2D and/or 3D reformatted images are provided for review. Automated exposure control, iterative reconstruction, and/or weight based adjustment of the mA/kV was utilized to reduce the radiation dose to as low as reasonably achievable. Stenosis of the  internal carotid arteries measured using NASCET criteria. COMPARISON: None available CLINICAL HISTORY: Neuro deficit, acute, stroke suspected. Stroke, Rt side weakness, hx strokes; Dr. Sallyann. FINDINGS: CTA NECK: AORTIC ARCH AND ARCH VESSELS: No dissection or arterial injury. No significant stenosis of the brachiocephalic or subclavian arteries. CERVICAL CAROTID ARTERIES: Progressive narrowing of the distal left internal carotid artery beginning at the petrous segment with occlusion at the distal cavernous segment. There is reconstitution of the supraclinoid portion with attenuated enhancement. The right internal carotid artery is patent without significant stenosis or dissection. No dissection or arterial injury is identified in the remaining cervical carotid arteries. CERVICAL VERTEBRAL ARTERIES: No dissection, arterial injury, or significant stenosis. LUNGS AND MEDIASTINUM: Biapical Emphysema. SOFT TISSUES: No acute abnormality. BONES: No acute abnormality. CTA HEAD: ANTERIOR CIRCULATION: The left internal carotid artery demonstrates progressive narrowing beginning at the petrous segment with occlusion at the distal cavernous segment. There is reconstitution of the supraclinoid portion with attenuated enhancement. The left MCA is occluded at its origin with poor distal collateralization. Moderate stenosis of the right MCA M1 segment. No significant stenosis of the anterior cerebral arteries. No aneurysm. POSTERIOR CIRCULATION: Mild stenosis of the right PCA p2 segment. Mild atherosclerotic irregularity of the left PCA without high-grade stenosis. No significant stenosis of the basilar artery. No significant stenosis of the vertebral arteries. No aneurysm. OTHER: No dural venous sinus thrombosis on this non-dedicated study. IMPRESSION: 1. Emergent large  vessel occlusion of the left MCA at its origin with poor distal collateralization. 2. Progressive narrowing of the distal left internal carotid artery beginning at  the petrous segment with occlusion at the distal cavernous segment and reconstitution of the supraclinoid portion with attenuated enhancement. 3. Moderate stenosis of the right MCA M1 segment. 4. Mild stenosis of the right PCA P2 segment. 5. Mild atherosclerotic irregularity of the left PCA without high-grade stenosis. Critical findings communicated to Dr. MAGDALENO Blower via telephone at 1:28 AM on 02/21/24. Electronically signed by: Franky Stanford MD 02/21/2024 01:32 AM EDT RP Workstation: [id number]   CT HEAD CODE STROKE WO CONTRAST Result Date: 02/21/2024 EXAM: CT HEAD WITHOUT CONTRAST 02/21/2024 12:40:34 AM TECHNIQUE: CT of the head was performed without the administration of intravenous contrast. Automated exposure control, iterative reconstruction, and/or weight based adjustment of the mA/kV was utilized to reduce the radiation dose to as low as reasonably achievable. COMPARISON: 02/26/2019 CLINICAL HISTORY: Neuro deficit, acute, stroke suspected. Stroke, Rt side weakness. FINDINGS: BRAIN AND VENTRICLES: No acute hemorrhage. There are old infarcts of the right MCA and PCA territory. There is a focal area of loss of gray-white differentiation in the posterior left MCA territory. ASPECTS is 8. Hyperdense left MCA. No hydrocephalus, extra-axial collection, mass effect, or midline shift. ASPECTS ([place] stroke program early CT score) - ganglionic level infarction (caudate, lentiform nuclei, internal capsule, insula, M1-m3 cortex): 6 - supraganglionic infarction (m4-m6 cortex): 2 Total score (0-10 with 10 being normal): 8 ORBITS: No acute abnormality. SINUSES: No acute abnormality. SOFT TISSUES AND SKULL: No acute soft tissue abnormality. No skull fracture. IMPRESSION: 1. Acute/early subacute left MCA territory infarct with focal loss of gray-white differentiation (ASPECTS 8) and hyperdense left MCA. 2. No intracranial hemorrhage. 3. 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 Findings communicated to Dr. MAGDALENO Blower via [facility] text page at 12:50 AM on 02/21/24. Electronically signed by: Franky Stanford MD 02/21/2024 12:52 AM EDT RP Workstation: [id number]    Vitals:   02/21/24 0800 02/21/24 0830 02/21/24 0900 02/21/24 0930  BP: 125/72 136/88 130/70 (!) 145/74  Pulse: 77 73 84 (!) 57  Resp: 15 16 (!) 24 15  Temp: 97.9 F (36.6 C) 98.1 F (36.7 C) 98.2 F (36.8 C) 98.2 F (36.8 C)  TempSrc:      SpO2: 93% 95% 95% 91%  Weight:         PHYSICAL EXAM General: Critically ill African-American male Psych:  Mood and affect appropriate for situation CV: Regular rate and rhythm on monitor Respiratory:  Regular, unlabored respirations on room air GI: Abdomen soft and nontender   NEURO:  Mental Status: He is lethargic, does open eyes to stimulation.  He is globally aphasic-Will make some noise but no real speech output.  Intermittently follow very simple commands slightly delayed  Cranial Nerves:  II: PERRL. Visual fields no blink to threat on right III, IV, VI: Left gaze preference does not cross midline V: Sensation is intact to light touch and symmetrical to face.  VII: Face is symmetrical resting and smiling VIII: hearing intact to voice. IX, X: Palate elevates symmetrically. Phonation is normal.  KP:Dynloizm shrug 5/5. XII: tongue is midline without fasciculations. Motor: He is purposeful on the left, and antigravity with left arm and leg, right arm no movement, right leg with trace withdrawal Tone: is normal and bulk is normal Sensation-decreased on right Coordination: Unable to assess Gait- deferred  Most Recent NIH18   ASSESSMENT/PLAN  Frank Fischer is  a 63 y.o. male with history of multiple prior strokes (R sided, with residual mild L hemiplegia), prostate cancer (s/p brachytherapy, 2018), polysubstance use (marijuana, crack, tobacco, alcohol), HTN, depression, BPH who presents as a code stroke for left gaze & right hemiplegia.  NIH on Admission 25  Acute Ischemic Infarct:   left MCA s/p mechanical thrombectomy and TNK of left carotid terminus and M1 occlusion with TICI 3 Etiology: Large vessel disease, uncontrolled risk factors Code Stroke  CT head  Acute/early subacute left MCA territory infarct with focal loss of gray-white differentiation (ASPECTS 8) and hyperdense left MCA. Chronic infarcts in the right MCA and PCA territories  CTA head & neck large vessel occlusion of the left MCA  MRI ordered MRA ordered 2D Echo ordered LDL 111 HgbA1c 6.1 UDS ordered VTE prophylaxis -SCDs No antithrombotic prior to admission, now on No antithrombotic for 24 hours post TNK and plan 24-hour brain imaging negative for hemorrhage.  If negative for hemorrhage will consider starting aspirin  and Plavix  Therapy recommendations:  Pending Disposition: Pending  Hx of Stroke/TIA Chronic infarcts in the right MCA and PCA territories  Hypertension Home meds: Amlodipine  10 mg, lisinopril  40 mg-appears patient is noncompliant and was not taking medication Stable Blood Pressure Goal: SBP 120-160 for first 24 hours then less than 180   Hyperlipidemia Home meds: Zetia  10 mg and atorvastatin  80 mg,  resumed in hospital-patient noted to be noncompliant with medication LDL 111, goal < 70 Continue statin at discharge   COPD Albuterol  as needed Maintain sats 88 to 92%  Tobacco Abuse Patient smokes cigarettes daily Assess willingness to quit when able Nicotine replacement therapy provided  Substance Abuse EtOH abuse Patient uses THC, cocaine Drinks 6 beers a day UDS ordered CIWA protocol Thiamine , folate, multivitamin ETOH use, alcohol level 130, advised to drink no more than 2 drink(s) a day Assess willingness to quit when able [facility] consult for cessation placed  Dysphagia Patient has post-stroke dysphagia, SLP consulted    Diet   Diet NPO time specified   Will need NG tube placement/core Trak  Other Stroke Risk Factors Chronic headaches History of prostate  cancer   Other Active Problems Depression-Home bupropion  200 mg twice daily BPH-Flomax   Hospital day # 0   [name] DNP, ACNPC-AG  [facility]  I have personally obtained history,examined this patient, reviewed notes, independently viewed imaging studies, participated in medical decision making and plan of care.ROS completed by me personally and pertinent positives fully documented  I have made any additions or clarifications directly to the above note. Agree with note above.  Patient presented with aphasia, gaze deviation and hemiparesis due to terminal left carotid and middle cerebral artery occlusion underwent successful recannulization with IV TNK followed by mechanical thrombectomy.  Patient neurological exam however still shows significant left gaze deviation, global aphasia and right hemiparesis.  Continue close neurological observation and strict blood pressure control as per post TNK protocol.  N.p.o. for now and check swallow eval in the morning.  Check 24-hour brain imaging.  Alcohol withdrawal precautions.  Long discussion with patient's family at the bedside and answered questions about his prognosis.  Discussed with Dr. Ray neurointerventional radiology This patient is critically ill and at significant risk of neurological worsening, death and care requires constant monitoring of vital signs, hemodynamics,respiratory and cardiac monitoring, extensive review of multiple databases, frequent neurological assessment, discussion with family, other specialists and medical decision making of high complexity.I have made any additions or clarifications directly to the above  note.This critical care time does not reflect procedure time, or teaching time or supervisory time of PA/NP/Med Resident etc but could involve care discussion time.  I spent 40 minutes of neurocritical care time  in the care of  this patient.      Eather Popp, MD Medical Director [facility] Pager: [phone] 02/21/2024 3:19 PM   To contact Stroke Continuity provider, please refer to [email or web address]. After hours, contact General Neurology

## 2024-02-21 NOTE — Plan of Care (Signed)
   Problem: Nutrition: Goal: Adequate nutrition will be maintained Outcome: Not Progressing

## 2024-02-21 NOTE — Anesthesia Procedure Notes (Signed)
 Procedure Name: Intubation Date/Time: 02/21/2024 1:36 AM  Performed by: Taira Knabe T, CRNAPre-anesthesia Checklist: Patient identified, Emergency Drugs available, Suction available and Patient being monitored Patient Re-evaluated:Patient Re-evaluated prior to induction Oxygen Delivery Method: Circle system utilized Preoxygenation: Pre-oxygenation with 100% oxygen Induction Type: IV induction and Rapid sequence Ventilation: Mask ventilation without difficulty Laryngoscope Size: Mac and 4 Grade View: Grade I Tube type: Oral Tube size: 7.5 mm Number of attempts: 1 Airway Equipment and Method: Stylet and Oral airway Placement Confirmation: ETT inserted through vocal cords under direct vision, positive ETCO2 and breath sounds checked- equal and bilateral Secured at: 22 cm Tube secured with: Tape Dental Injury: Teeth and Oropharynx as per pre-operative assessment

## 2024-02-22 LAB — BASIC METABOLIC PANEL WITH GFR
Anion gap: 7 (ref 5–15)
BUN: 7 mg/dL — ABNORMAL LOW (ref 8–23)
CO2: 23 mmol/L (ref 22–32)
Calcium: 8.2 mg/dL — ABNORMAL LOW (ref 8.9–10.3)
Chloride: 109 mmol/L (ref 98–111)
Creatinine, Ser: 1.03 mg/dL (ref 0.61–1.24)
GFR, Estimated: >60 mL/min (ref >60)
Glucose, Bld: 96 mg/dL (ref 70–99)
Potassium: 3.8 mmol/L (ref 3.5–5.1)
Sodium: 139 mmol/L (ref 135–145)

## 2024-02-22 LAB — CBC WITH DIFFERENTIAL/PLATELET
Abs Immature Granulocytes: 0.02 K/uL (ref 0.00–0.07)
Basophils Absolute: 0 K/uL (ref 0.0–0.1)
Basophils Relative: 0 %
Eosinophils Absolute: 0 K/uL (ref 0.0–0.5)
Eosinophils Relative: 0 %
HCT: 40.7 % (ref 39.0–52.0)
Hemoglobin: 13.7 g/dL (ref 13.0–17.0)
Immature Granulocytes: 0 %
Lymphocytes Relative: 28 %
Lymphs Abs: 2.1 K/uL (ref 0.7–4.0)
MCH: 32 pg (ref 26.0–34.0)
MCHC: 33.7 g/dL (ref 30.0–36.0)
MCV: 95.1 fL (ref 80.0–100.0)
Monocytes Absolute: 0.9 K/uL (ref 0.1–1.0)
Monocytes Relative: 11 %
Neutro Abs: 4.5 K/uL (ref 1.7–7.7)
Neutrophils Relative %: 61 %
Platelets: 245 K/uL (ref 150–400)
RBC: 4.28 MIL/uL (ref 4.22–5.81)
RDW: 14.5 % (ref 11.5–15.5)
WBC: 7.6 K/uL (ref 4.0–10.5)
nRBC: 0 % (ref 0.0–0.2)

## 2024-02-22 LAB — GLUCOSE, CAPILLARY
Glucose-Capillary: 111 mg/dL — ABNORMAL HIGH (ref 70–99)
Glucose-Capillary: 113 mg/dL — ABNORMAL HIGH (ref 70–99)

## 2024-02-22 MED ORDER — SODIUM CHLORIDE 0.9 % IV SOLN: INTRAVENOUS | Status: AC

## 2024-02-22 MED ORDER — PANTOPRAZOLE SODIUM 40 MG PO TBEC: 40.0000 mg | DELAYED_RELEASE_TABLET | Freq: Every day | ORAL | Status: DC

## 2024-02-22 MED ORDER — ASPIRIN 81 MG PO CHEW
81.0000 mg | CHEWABLE_TABLET | Freq: Every day | ORAL | Status: DC
  Administered 2024-02-22 – 2024-02-27 (×6): 81 mg via ORAL
  Filled 2024-02-22 (×6): qty 1

## 2024-02-22 MED ORDER — ENOXAPARIN SODIUM 40 MG/0.4ML IJ SOSY
40.0000 mg | PREFILLED_SYRINGE | Freq: Every day | INTRAMUSCULAR | Status: DC
  Administered 2024-02-22 – 2024-02-27 (×6): 40 mg via SUBCUTANEOUS
  Filled 2024-02-22 (×6): qty 0.4

## 2024-02-22 NOTE — Progress Notes (Signed)
 Inpatient Rehab Admissions Coordinator Note:   Per therapy patient was screened for CIR candidacy by Calyn Rubi [name], CCC-SLP. At this time, pt appears to be a potential candidate for CIR. I will place an order for rehab consult for full assessment, per our protocol.  Please contact me any with questions.SABRA Tinnie Yvone Cohens, MS, CCC-SLP Admissions Coordinator [phone] 02/22/24 5:12 PM

## 2024-02-22 NOTE — Anesthesia Postprocedure Evaluation (Signed)
 Anesthesia Post Note  Patient: Frank Fischer  Procedure(s) Performed: RADIOLOGY WITH ANESTHESIA     Patient location during evaluation: PACU Anesthesia Type: General Level of consciousness: awake Pain management: pain level controlled Vital Signs Assessment: post-procedure vital signs reviewed and stable Respiratory status: spontaneous breathing, nonlabored ventilation, respiratory function stable and patient connected to nasal cannula oxygen Cardiovascular status: blood pressure returned to baseline and stable Postop Assessment: no apparent nausea or vomiting Anesthetic complications: no   No notable events documented.  Last Vitals:  Vitals:   02/22/24 0800 02/22/24 0833  BP: 126/75 (!) 131/51  Pulse: (!) 41 62  Resp: 13 18  Temp: 37.3 C   SpO2: 96% 95%    Last Pain:  Vitals:   02/22/24 0800  TempSrc: (P) Rectal  PainSc:                  [name] S

## 2024-02-22 NOTE — Evaluation (Signed)
 Physical Therapy Evaluation Patient Details Name: Frank Fischer MRN: [id number] DOB: 1960-08-03 Today's Date: 02/22/2024  History of Present Illness  Pt is a 63 y.o. male who presented 02/21/24 with L gaze and R-sided weakness. Pt received TNK. S/p L carotid terminus and M1 thrombectomy 10/4. MRI showed large early subacute left MCA territory infarct, predominantly posterior, with small petechial hemorrhage. PMH: multiple prior strokes (R sided, with residual mild L hemiplegia), prostate cancer (s/p brachytherapy, 2018), polysubstance use (marijuana, crack, tobacco, alcohol), HTN, depression, BPH   Clinical Impression  Pt presents with condition above and deficits mentioned below, see PT Problem List. Pt is currently displaying deficits in communication, but appeared to be fairly consistent and accurate with yes/no questions, intermittently verbally speaking intelligible words to answer questions. His PLOF/home info may need to be confirmed at a later date, but pt reported at baseline he is independent without DME and he lives with his wife in a 1-level home with a level entry. Currently, in addition to deficits in communication, the pt displays deficits in cognition, balance, power, activity tolerance, and R-sided strength and coordination. Pt also with R inattention and lean. Pt needed minAx2 to transfer sit to stand and modAx2 for bed mobility and to step pivot along EOB with bil HHA this date. He is at high risk for falls and could greatly benefit from intensive inpatient rehab, > 3 hours/day. Will continue to follow acutely.        If plan is discharge home, recommend the following: Two people to help with walking and/or transfers;A lot of help with bathing/dressing/bathroom;Assistance with cooking/housework;Direct supervision/assist for medications management;Direct supervision/assist for financial management;Assist for transportation;Help with stairs or ramp for entrance;Supervision due to  cognitive status   Can travel by private vehicle        Equipment Recommendations Rolling walker (2 wheels);BSC/3in1;Wheelchair (measurements PT);Wheelchair cushion (measurements PT) (pending progress)  Recommendations for Other Services  Rehab consult    Functional Status Assessment Patient has had a recent decline in their functional status and demonstrates the ability to make significant improvements in function in a reasonable and predictable amount of time.     Precautions / Restrictions Precautions Precautions: Fall Recall of Precautions/Restrictions: Impaired Precaution/Restrictions Comments: SBP < 160 Restrictions Weight Bearing Restrictions Per Provider Order: No      Mobility  Bed Mobility Overal bed mobility: Needs Assistance Bed Mobility: Supine to Sit, Sit to Supine     Supine to sit: Mod assist, +2 for physical assistance, +2 for safety/equipment, HOB elevated Sit to supine: Mod assist, +2 for physical assistance, +2 for safety/equipment, HOB elevated   General bed mobility comments: Max multi-modal cues and gestures for pt to initiate sitting up L EOB, with pt needing modAx2 to manage trunk, scoot hips, and manage R extremities. ModAx2 needed to direct pt to lean to L to descend trunk and to lift legs to return to supine.    Transfers Overall transfer level: Needs assistance Equipment used: 2 person hand held assist Transfers: Sit to/from Stand, Bed to chair/wheelchair/BSC Sit to Stand: Min assist, +2 physical assistance, +2 safety/equipment   Step pivot transfers: Mod assist, +2 physical assistance, +2 safety/equipment       General transfer comment: Bil HHA provided with pt demonstrating good initiation to power up to stand, needing minAx2 to balance and power up from EOB. ModAx2 with bil HHA to shift weight bil to facilitate feet advancement to step L along EOB towards the [facility].    Ambulation/Gait Ambulation/Gait assistance: Mod  assist, +2 physical  assistance, +2 safety/equipment Gait Distance (Feet): 3 Feet Assistive device: 2 person hand held assist Gait Pattern/deviations: Step-to pattern, Decreased step length - right, Decreased step length - left, Decreased stride length, Decreased weight shift to right, Decreased weight shift to left Gait velocity: reduced Gait velocity interpretation: <1.31 ft/sec, indicative of household ambulator   General Gait Details: No buckling noted. Pt needed modAx2 to shift weight to facilitate stepping to L along EOB towards HOB.  Stairs            Wheelchair Mobility     Tilt Bed    Modified Rankin (Stroke Patients Only) Modified Rankin (Stroke Patients Only) Pre-Morbid Rankin Score: No symptoms Modified Rankin: Moderately severe disability     Balance Overall balance assessment: Needs assistance Sitting-balance support: No upper extremity supported, Feet supported Sitting balance-Leahy Scale: Fair Sitting balance - Comments: static sitting EOB, initially with modA due to R lean, but as neck stretched to midline from pt resting in R lateral flexion, his balance at his trunk improved to midline also, needing only CGA-minA for balance Postural control: Right lateral lean Standing balance support: Bilateral upper extremity supported, During functional activity, Reliant on assistive device for balance Standing balance-Leahy Scale: Poor Standing balance comment: reliant on bil HHA and external physical assistance                             Pertinent Vitals/Pain Pain Assessment Pain Assessment: Faces Faces Pain Scale: Hurts a little bit Pain Location: grimacing intermittently with general mobility Pain Descriptors / Indicators: Grimacing Pain Intervention(s): Limited activity within patient's tolerance, Monitored during session, Repositioned    Home Living Family/patient expects to be discharged to:: Private residence Living Arrangements: Spouse/significant  other Available Help at Discharge: Family   Home Access: Level entry       Home Layout: One level Home Equipment:  (pt with difficulty reporting) Additional Comments: may need to confirm at later date due to expressive and receptive deficits, unsure of accuracy    Prior Function Prior Level of Function : Independent/Modified Independent             Mobility Comments: no AD ADLs Comments: does not drive     Extremity/Trunk Assessment   Upper Extremity Assessment Upper Extremity Assessment: Defer to OT evaluation    Lower Extremity Assessment Lower Extremity Assessment: RLE deficits/detail RLE Deficits / Details: withdrew to noxious stimuli but with delay; grossly </= 3- strength; incoordination noted    Cervical / Trunk Assessment Cervical / Trunk Assessment: Normal  Communication   Communication Communication: Impaired Factors Affecting Communication: Difficulty expressing self;Reduced clarity of speech    Cognition Arousal: Alert Behavior During Therapy: Flat affect   PT - Cognitive impairments: Difficult to assess, Attention, Awareness, Initiation, Sequencing, Problem solving, Safety/Judgement Difficult to assess due to: Impaired communication                     PT - Cognition Comments: Pt with impaired receptive and expressive communication, but would intermittently mumble some intelligible words. Pt did appear to be fairly consistent and accurate with yes/no questions, answering orientation questions correctly and providing some home info. Pt with decreased R attention, needing max cues to attend to R extremities. Poor awareness of impaired motor control. Needs multi-modal max cuing to initiate and sequence mobility Following commands: Impaired Following commands impaired: Follows one step commands inconsistently, Follows one step commands with increased time  Cueing Cueing Techniques: Verbal cues, Tactile cues, Visual cues, Gestural cues      General Comments General comments (skin integrity, edema, etc.): VSS on RA    Exercises     Assessment/Plan    PT Assessment Patient needs continued PT services  PT Problem List Decreased strength;Decreased activity tolerance;Decreased balance;Decreased mobility;Decreased coordination;Decreased cognition;Decreased knowledge of use of DME;Decreased safety awareness       PT Treatment Interventions DME instruction;Gait training;Stair training;Functional mobility training;Therapeutic activities;Therapeutic exercise;Balance training;Neuromuscular re-education;Cognitive remediation;Patient/family education    PT Goals (Current goals can be found in the Care Plan section)  Acute Rehab PT Goals Patient Stated Goal: did not/unable to state PT Goal Formulation: Patient unable to participate in goal setting Time For Goal Achievement: 03/07/24 Potential to Achieve Goals: Good    Frequency Min 3X/week     Co-evaluation   Reason for Co-Treatment: Complexity of the patient's impairments (multi-system involvement);Necessary to address cognition/behavior during functional activity;For patient/therapist safety;To address functional/ADL transfers PT goals addressed during session: Mobility/safety with mobility;Balance OT goals addressed during session: ADL's and self-care       AM-PAC PT 6 Clicks Mobility  Outcome Measure Help needed turning from your back to your side while in a flat bed without using bedrails?: Total Help needed moving from lying on your back to sitting on the side of a flat bed without using bedrails?: Total Help needed moving to and from a bed to a chair (including a wheelchair)?: Total Help needed standing up from a chair using your arms (e.g., wheelchair or bedside chair)?: Total Help needed to walk in hospital room?: Total Help needed climbing 3-5 steps with a railing? : Total 6 Click Score: 6    End of Session Equipment Utilized During Treatment: Gait  belt Activity Tolerance: Patient tolerated treatment well Patient left: in bed;with call bell/phone within reach;with bed alarm set Nurse Communication: Mobility status PT Visit Diagnosis: Unsteadiness on feet (R26.81);Other abnormalities of gait and mobility (R26.89);Muscle weakness (generalized) (M62.81);Difficulty in walking, not elsewhere classified (R26.2);Other symptoms and signs involving the nervous system (R29.898);Hemiplegia and hemiparesis Hemiplegia - Right/Left: Right Hemiplegia - caused by: Cerebral infarction    Time: 8946-8877 PT Time Calculation (min) (ACUTE ONLY): 29 min   Charges:   PT Evaluation $PT Eval Moderate Complexity: 1 Mod   PT General Charges $$ ACUTE PT VISIT: 1 Visit         Theo Ferretti, PT, DPT Acute Rehabilitation Services  Office: [phone]   Theo CHRISTELLA Ferretti 02/22/2024, 1:21 PM

## 2024-02-22 NOTE — Progress Notes (Addendum)
 STROKE TEAM PROGRESS NOTE    SIGNIFICANT HOSPITAL EVENTS 10/3 presented as code stroke for left gaze and right hemiplegia received IV TNK and underwent left carotid terminus and M1 occlusion with TICI 3.  Admitted to the ICU  INTERIM HISTORY/SUBJECTIVE  No family at the bedside.  MRI with large subacute left MCA infarct with small petechial hemorrhage.  MRA with now new interval left ICA occlusion but preserved flow in the left MCA He is more awake today and is following commands with cues UDS is positive for cocaine and THC Will transfer him out of the ICU today CBC    Component Value Date/Time   WBC 7.6 02/22/2024 0510   RBC 4.28 02/22/2024 0510   HGB 13.7 02/22/2024 0510   HGB 15.7 10/17/2016 1618   HCT 40.7 02/22/2024 0510   HCT 46.9 10/17/2016 1618   PLT 245 02/22/2024 0510   PLT 241 10/17/2016 1618   MCV 95.1 02/22/2024 0510   MCV 95 10/17/2016 1618   MCH 32.0 02/22/2024 0510   MCHC 33.7 02/22/2024 0510   RDW 14.5 02/22/2024 0510   RDW 15.6 (H) 10/17/2016 1618   LYMPHSABS 2.1 02/22/2024 0510   MONOABS 0.9 02/22/2024 0510   EOSABS 0.0 02/22/2024 0510   BASOSABS 0.0 02/22/2024 0510    BMET    Component Value Date/Time   NA 139 02/22/2024 0510   NA 144 10/17/2016 1618   K 3.8 02/22/2024 0510   CL 109 02/22/2024 0510   CO2 23 02/22/2024 0510   GLUCOSE 96 02/22/2024 0510   BUN 7 (L) 02/22/2024 0510   BUN 14 10/17/2016 1618   CREATININE 1.03 02/22/2024 0510   CREATININE 1.16 07/22/2023 1434   CALCIUM  8.2 (L) 02/22/2024 0510   EGFR 70 07/22/2023 1434   GFRNONAA >60 02/22/2024 0510   GFRNONAA 88 06/21/2013 1559    IMAGING past 24 hours MR BRAIN WO CONTRAST Result Date: 02/22/2024 EXAM: MRI BRAIN WITHOUT CONTRAST MRA HEAD WITHOUT CONTRAST 02/22/2024 12:35:18 AM TECHNIQUE: Multiplanar multisequence MRI of the brain was performed without the administration of intravenous contrast. MRA of the head was performed without contrast using time-of-flight technique. 3D  postprocessing with multiplanar reformations and MIPs was performed for better evaluation of the vasculature. COMPARISON: 02/26/2019 CLINICAL HISTORY: Stroke, follow up. FINDINGS: MRI BRAIN: BRAIN AND VENTRICLES: Large early subacute infarct of the left MCA territory predominantly affecting the posterior region. Small amount of petechial hemorrhage in the infarcted region. Multifocal hyperintense T2-weighted signal within the cerebral white matter, most commonly due to chronic small vessel disease. Multiple old infarcts of the right MCA and PCA territories. Abnormal left ICA flow void. No mass or abnormal enhancement. No midline shift. No hydrocephalus. The sella is unremarkable. Normal flow voids. ORBITS: No acute abnormality. SINUSES AND MASTOIDS: No acute abnormality. BONES AND SOFT TISSUES: Normal bone marrow signal. No acute soft tissue abnormality. MRA HEAD: ANTERIOR CIRCULATION: There is no flow related enhancement within the left ICA on the time of flight angiographic images. No significant stenosis of the right internal carotid artery. There is normal flow related enhancement throughout the left MCA. No significant stenosis of the right middle cerebral artery. No significant stenosis of the anterior cerebral arteries. No aneurysm. POSTERIOR CIRCULATION: Unchanged severe stenosis of the right PCA P2 segment. No significant stenosis of the left posterior cerebral artery. No significant stenosis of the basilar artery. No significant stenosis of the vertebral arteries. No aneurysm. IMPRESSION: 1. Large early subacute left MCA territory infarct, predominantly posterior, with small petechial  hemorrhage (Heidelberg class 1). 2. Left ICA occlusion.  Left MCA is now patent. 3. Multiple chronic infarcts in the right MCA and PCA territories. 4. 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 Electronically signed by: Franky Stanford MD 02/22/2024 12:52 AM EDT RP Workstation: [id number]   MR ANGIO HEAD WO  CONTRAST Result Date: 02/22/2024 EXAM: MRI BRAIN WITHOUT CONTRAST MRA HEAD WITHOUT CONTRAST 02/22/2024 12:35:18 AM TECHNIQUE: Multiplanar multisequence MRI of the brain was performed without the administration of intravenous contrast. MRA of the head was performed without contrast using time-of-flight technique. 3D postprocessing with multiplanar reformations and MIPs was performed for better evaluation of the vasculature. COMPARISON: 02/26/2019 CLINICAL HISTORY: Stroke, follow up. FINDINGS: MRI BRAIN: BRAIN AND VENTRICLES: Large early subacute infarct of the left MCA territory predominantly affecting the posterior region. Small amount of petechial hemorrhage in the infarcted region. Multifocal hyperintense T2-weighted signal within the cerebral white matter, most commonly due to chronic small vessel disease. Multiple old infarcts of the right MCA and PCA territories. Abnormal left ICA flow void. No mass or abnormal enhancement. No midline shift. No hydrocephalus. The sella is unremarkable. Normal flow voids. ORBITS: No acute abnormality. SINUSES AND MASTOIDS: No acute abnormality. BONES AND SOFT TISSUES: Normal bone marrow signal. No acute soft tissue abnormality. MRA HEAD: ANTERIOR CIRCULATION: There is no flow related enhancement within the left ICA on the time of flight angiographic images. No significant stenosis of the right internal carotid artery. There is normal flow related enhancement throughout the left MCA. No significant stenosis of the right middle cerebral artery. No significant stenosis of the anterior cerebral arteries. No aneurysm. POSTERIOR CIRCULATION: Unchanged severe stenosis of the right PCA P2 segment. No significant stenosis of the left posterior cerebral artery. No significant stenosis of the basilar artery. No significant stenosis of the vertebral arteries. No aneurysm. IMPRESSION: 1. Large early subacute left MCA territory infarct, predominantly posterior, with small petechial  hemorrhage (Heidelberg class 1). 2. Left ICA occlusion.  Left MCA is now patent. 3. Multiple chronic infarcts in the right MCA and PCA territories. 4. 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 Electronically signed by: Franky Stanford MD 02/22/2024 12:52 AM EDT RP Workstation: [id number]   ECHOCARDIOGRAM COMPLETE Result Date: 02/21/2024    ECHOCARDIOGRAM REPORT   Patient Name:   MELODY SAVIDGE Date of Exam: 02/21/2024 Medical Rec #:  [id number]      Height:       70.0 in Accession #:    [id number]     Weight:       205.0 lb Date of Birth:  01/24/1961     BSA:          2.109 m Patient Age:    62 years       BP:           150/74 mmHg Patient Gender: M              HR:           50 bpm. Exam Location:  Inpatient Procedure: 2D Echo, 3D Echo, Color Doppler and Cardiac Doppler (Both Spectral            and Color Flow Doppler were utilized during procedure). Indications:    Stroke I63.9  History:        Patient has prior history of Echocardiogram examinations, most                 recent 02/26/2019. Stroke, Arrythmias:Bradycardia; Risk  Factors:Hypertension, Current Smoker and PSA.  Sonographer:    Koleen Popper RDCS Referring Phys: [id number] NORMIE M BUI IMPRESSIONS  1. Left ventricular ejection fraction, by estimation, is 60 to 65%. Left ventricular ejection fraction by 3D volume is 52 %. The left ventricle has normal function. The left ventricle has no regional wall motion abnormalities. Left ventricular diastolic  parameters were normal.  2. Right ventricular systolic function is normal. The right ventricular size is normal. Tricuspid regurgitation signal is inadequate for assessing PA pressure.  3. The mitral valve is normal in structure. No evidence of mitral valve regurgitation. No evidence of mitral stenosis.  4. The aortic valve was not well visualized. Aortic valve regurgitation is not visualized. No aortic stenosis is present.  5. The inferior vena cava is normal in size with greater than  50% respiratory variability, suggesting right atrial pressure of 3 mmHg.  6. Increased flow velocities may be secondary to anemia, thyrotoxicosis, hyperdynamic or high flow state. Comparison(s): No significant change from prior study. FINDINGS  Left Ventricle: Left ventricular ejection fraction, by estimation, is 60 to 65%. Left ventricular ejection fraction by 3D volume is 52 %. The left ventricle has normal function. The left ventricle has no regional wall motion abnormalities. Strain was performed and the global longitudinal strain is indeterminate. The left ventricular internal cavity size was normal in size. There is no left ventricular hypertrophy. Left ventricular diastolic parameters were normal. Right Ventricle: The right ventricular size is normal. No increase in right ventricular wall thickness. Right ventricular systolic function is normal. Tricuspid regurgitation signal is inadequate for assessing PA pressure. Left Atrium: Left atrial size was normal in size. Right Atrium: Right atrial size was normal in size. Pericardium: There is no evidence of pericardial effusion. Mitral Valve: The mitral valve is normal in structure. No evidence of mitral valve regurgitation. No evidence of mitral valve stenosis. Tricuspid Valve: The tricuspid valve is normal in structure. Tricuspid valve regurgitation is not demonstrated. No evidence of tricuspid stenosis. Aortic Valve: The aortic valve was not well visualized. Aortic valve regurgitation is not visualized. No aortic stenosis is present. Aortic valve mean gradient measures 6.0 mmHg. Aortic valve peak gradient measures 13.5 mmHg. Aortic valve area, by VTI measures 2.53 cm. Pulmonic Valve: The pulmonic valve was normal in structure. Pulmonic valve regurgitation is not visualized. No evidence of pulmonic stenosis. Aorta: The aortic root is normal in size and structure. Venous: The inferior vena cava is normal in size with greater than 50% respiratory variability,  suggesting right atrial pressure of 3 mmHg. IAS/Shunts: No atrial level shunt detected by color flow Doppler. Additional Comments: 3D was performed not requiring image post processing on an independent workstation and was indeterminate.  LEFT VENTRICLE PLAX 2D LVIDd:         4.50 cm         Diastology LVIDs:         3.24 cm         LV e' medial:    11.60 cm/s LV PW:         0.85 cm         LV E/e' medial:  8.8 LV IVS:        1.09 cm         LV e' lateral:   13.50 cm/s LVOT diam:     1.81 cm         LV E/e' lateral: 7.6 LV SV:         80 LV SV  Index:   38 LVOT Area:     2.57 cm        3D Volume EF                                LV 3D EF:    Left                                             ventricul LV Volumes (MOD)                            ar LV vol d, MOD    132.0 ml                   ejection A4C:                                        fraction LV vol s, MOD    51.6 ml                    by 3D A4C:                                        volume is LV SV MOD A4C:   132.0 ml                   52 %.                                 3D Volume EF:                                3D EF:        52 %                                LV EDV:       144 ml                                LV ESV:       69 ml                                LV SV:        75 ml RIGHT VENTRICLE             IVC RV Basal diam:  3.98 cm     IVC diam: 1.92 cm RV S prime:     18.20 cm/s TAPSE (M-mode): 2.4 cm LEFT ATRIUM             Index        RIGHT ATRIUM           Index LA diam:        2.86 cm 1.36 cm/m   RA Area:  12.60 cm LA Vol (A2C):   55.2 ml 26.17 ml/m  RA Volume:   26.90 ml  12.75 ml/m LA Vol (A4C):   26.8 ml 12.70 ml/m LA Biplane Vol: 37.8 ml 17.92 ml/m  AORTIC VALVE AV Area (Vmax):    2.28 cm AV Area (Vmean):   2.22 cm AV Area (VTI):     2.53 cm AV Vmax:           184.00 cm/s AV Vmean:          102.000 cm/s AV VTI:            0.317 m AV Peak Grad:      13.5 mmHg AV Mean Grad:      6.0 mmHg LVOT Vmax:         163.00 cm/s LVOT  Vmean:        88.100 cm/s LVOT VTI:          0.312 m LVOT/AV VTI ratio: 0.98  AORTA Ao Root diam: 2.37 cm MITRAL VALVE MV Area (PHT): 3.42 cm     SHUNTS MV Decel Time: 222 msec     Systemic VTI:  0.31 m MV E velocity: 102.00 cm/s  Systemic Diam: 1.81 cm MV A velocity: 66.60 cm/s MV E/A ratio:  1.53 Vishnu Priya Mallipeddi Electronically signed by Diannah Late Mallipeddi Signature Date/Time: 02/21/2024/4:42:33 PM    Final     Vitals:   02/22/24 0800 02/22/24 0833 02/22/24 0900 02/22/24 1000  BP: 126/75 (!) 131/51    Pulse: (!) 41 62 (!) 54 (!) 45  Resp: 13 18 20 15   Temp: 99.1 F (37.3 C)  99 F (37.2 C) 99.1 F (37.3 C)  TempSrc: Rectal     SpO2: 96% 95% 96% 95%  Weight:         PHYSICAL EXAM General: Critically ill African-American male Psych:  Mood and affect appropriate for situation CV: Regular rate and rhythm on monitor Respiratory:  Regular, unlabored respirations on room air GI: Abdomen soft and nontender   NEURO:  Mental Status: More awake and responsive today he is globally aphasic-Will make some noise but no real speech output.  Able to follow commands with cues slightly delayed  Cranial Nerves:  II: PERRL. Visual fields no blink to threat on right III, IV, VI: Left gaze preference can cross midline V: Sensation is intact to light touch and symmetrical to face.  VII: Face is symmetrical resting and smiling VIII: hearing intact to voice. IX, X: Palate elevates symmetrically. Phonation is normal.  KP:Dynloizm shrug 5/5. XII: tongue is midline without fasciculations. Motor: He is purposeful on the left, and antigravity with left arm and leg, right arm is antigravity t, right leg with trace withdrawal Tone: is normal and bulk is normal Sensation-decreased on right Coordination: Unable to assess Gait- deferred  Most Recent NIH18   ASSESSMENT/PLAN  [name] is a 63 y.o. male with history of multiple prior strokes (R sided, with residual mild L  hemiplegia), prostate cancer (s/p brachytherapy, 2018), polysubstance use (marijuana, crack, tobacco, alcohol), HTN, depression, BPH who presents as a code stroke for left gaze & right hemiplegia.  NIH on Admission 25  Acute Ischemic Infarct:  left MCA s/p mechanical thrombectomy and TNK of left carotid terminus and M1 occlusion with TICI 3 Etiology: Large vessel disease, uncontrolled risk factors Code Stroke  CT head  Acute/early subacute left MCA territory infarct with focal loss of gray-white differentiation (ASPECTS 8) and hyperdense left MCA. Chronic infarcts  in the right MCA and PCA territories  CTA head & neck large vessel occlusion of the left MCA  MRI large subacute left MCA infarct with small petechial hemorrhage MRA  left ICA occlusion 2D Echo EF 60 to 65%. LDL 111 HgbA1c 6.1 UDS positive for cocaine and THC VTE prophylaxis -SCDs No antithrombotic prior to admission, now on aspirin  81 mg daily alone given large size of stroke Therapy recommendations:  Pending Disposition: Pending  Hx of Stroke/TIA Chronic infarcts in the right MCA and PCA territories  Hypertension Home meds: Amlodipine  10 mg, lisinopril  40 mg-appears patient is noncompliant and was not taking medication Stable Norvasc  and lisinopril  restarted Blood Pressure Goal: SBP 120-160 for first 24 hours then less than 180   Hyperlipidemia Home meds: Zetia  10 mg and atorvastatin  80 mg,  resumed in hospital-patient noted to be noncompliant with medication LDL 111, goal < 70 Continue statin at discharge   COPD Albuterol  as needed Maintain sats 88 to 92%  Tobacco Abuse Patient smokes cigarettes daily Assess willingness to quit when able Nicotine replacement therapy provided  Substance Abuse EtOH abuse Patient uses THC, cocaine Drinks 6 beers a day UDS ordered CIWA protocol Thiamine , folate, multivitamin ETOH use, alcohol level 130, advised to drink no more than 2 drink(s) a day Assess willingness to quit  when able [facility] consult for cessation placed  Dysphagia Patient has post-stroke dysphagia, SLP consulted    Diet   DIET DYS 3 Room service appropriate? Yes with Assist; Fluid consistency: Thin    Other Stroke Risk Factors Chronic headaches History of prostate cancer   Other Active Problems Depression-Home bupropion  200 mg twice daily BPH-Flomax   Hospital day # 1   [name] DNP, ACNPC-AG  [facility]  I have personally obtained history,examined this patient, reviewed notes, independently viewed imaging studies, participated in medical decision making and plan of care.ROS completed by me personally and pertinent positives fully documented  I have made any additions or clarifications directly to the above note. Agree with note above.  Patient presented with terminal left ICA occlusion and MCA occlusion and underwent treatment with IV TNK followed by successful mechanical thrombectomy.  Continue close neurological observation and strict blood pressure control as per post TNK and thrombectomy protocol.  Mobilize out of bed.  Therapy consults.  Continue ongoing stroke workup.  Recommend aspirin  alone after 24 hours post TNK for stroke prevention given large size of stroke.  No family at the bedside.  Discussed with Dr. Maude Naegeli neurointerventionalist. This patient is critically ill and at significant risk of neurological worsening, death and care requires constant monitoring of vital signs, hemodynamics,respiratory and cardiac monitoring, extensive review of multiple databases, frequent neurological assessment, discussion with family, other specialists and medical decision making of high complexity.I have made any additions or clarifications directly to the above note.This critical care time does not reflect procedure time, or teaching time or supervisory time of PA/NP/Med Resident etc but could involve care discussion time.  I spent 33 minutes of neurocritical care time  in the  care of  this patient.      Eather Popp, MD Medical Director [facility] Pager: [phone] 02/22/2024 3:30 PM   To contact Stroke Continuity provider, please refer to [email or web address]. After hours, contact General Neurology

## 2024-02-22 NOTE — Evaluation (Signed)
 Clinical/Bedside Swallow Evaluation Patient Details  Name: Frank Fischer MRN: [id number] Date of Birth: 1960-09-19  Today's Date: 02/22/2024 Time: SLP Start Time (ACUTE ONLY): 1030 SLP Stop Time (ACUTE ONLY): 1055 SLP Time Calculation (min) (ACUTE ONLY): 25 min  Past Medical History:  Past Medical History:  Diagnosis Date   Arthritis    Cervical stenosis of spine    Chronic headaches    Dental caries    periodontal disease   Frequency of urination    History of cerebral infarction 11-28-2017  per pt never had symptoms   per MRI imaging 10-31-2016 2 small chronic lacunar infartions in the right frontal periventricular white matter , and chronic left medial orbital fracture (as seen 2014 MRI)   Hypertension    Noncompliance with medication regimen    Prostate cancer [facility]) urologist-  dr winter/  oncologist-  dr patrcia   dx 07-21-2017--- Stage T1c, Gleason 4+3,  PSA 13.40,  vol 49.7cc--  scheduled for radioactive seed implants 12-03-2017   Past Surgical History:  Past Surgical History:  Procedure Laterality Date   ABDOMINAL EXPLORATION SURGERY  age 50   per pt had Small Bowel Resection and Appendectomy   APPENDECTOMY     ARM SURGERY Left 1999   REPAIR INJURY-- ARM CUT ABOUT OFF   IR CATHETER TUBE CHANGE  10/28/2021   IR CT HEAD LTD  02/21/2024   IR GUIDED DRAIN W CATHETER PLACEMENT  09/03/2021   IR PERCUTANEOUS ART THROMBECTOMY/INFUSION INTRACRANIAL INC DIAG ANGIO  02/21/2024   IR US  GUIDANCE  09/03/2021   IR US  GUIDE VASC ACCESS RIGHT  02/21/2024   PROSTATE BIOPSY  07/21/2017  dr winter office   RADIOACTIVE SEED IMPLANT N/A 12/03/2017   Procedure: RADIOACTIVE SEED IMPLANT/BRACHYTHERAPY IMPLANT;  Surgeon: Devere Lonni Righter, MD;  Location: [facility];  Service: Urology;  Laterality: N/A;  ONLY NEEDS 120 MIN   SPACE OAR INSTILLATION N/A 12/03/2017   Procedure: SPACE OAR INSTILLATION;  Surgeon: Devere Lonni Righter, MD;  Location: [facility];  Service: Urology;  Laterality: N/A;   TOOTH EXTRACTION N/A 09/21/2018   Procedure: DENTAL EXTRACTIONS WITH ALVEOLIPLASTY;  Surgeon: Sheryle Hamilton, DDS;  Location: [facility] OR;  Service: Oral Surgery;  Laterality: N/A;   HPI:  63 y.o. male with hx of multiple prior strokes (R sided, with residual mild L hemiplegia), prostate cancer (s/p brachytherapy, 2018), polysubstance use (marijuana, crack, tobacco, alcohol), HTN, depression, BPH, presented as a code stroke for left gaze & right hemiplegia. MRI of head noted for Large early subacute infarct of the left MCA territory predominantly affecting  the posterior region. Small amount of petechial hemorrhage in the infarcted region.    Assessment / Plan / Recommendation  Clinical Impression  Pt presents with a suspected mild oropharyngeal dysphagia. Orally pt with some delay in oral propulsion and mildly prolonged mastication of solids. At baseline pt reports edentulous status. No hx of dysphagia. Per palpation swallow initiation appeared delayed. Vcoal quality remained clear. Prior to POs pt noted to have a bit of strider on deep inhalation at baseline, nursing notified. No distress noted with coordinating breathing and swallowing. No overt s/sx of aspiration with any POs. SLP present for trial of PO meds whole in puree with nursing. Recommend diet initiation of dysphagia 3 (mechanical soft) and thin liquids with meds whole in puree. SLP to follow up for diet tolerance and completion of speech language evaluation. Pt does exhibit aphasia post CVA.  SLP Visit Diagnosis: Dysphagia, unspecified (R13.10);Dysphagia,  oral phase (R13.11)    Aspiration Risk  Mild aspiration risk    Diet Recommendation   Thin;Dysphagia 3 (mechanical soft)  Medication Administration: Whole meds with puree    Other  Recommendations Oral Care Recommendations: Oral care BID     Assistance Recommended at Discharge    Functional Status Assessment    Frequency and Duration min  1 x/week  1 week       Prognosis        Swallow Study   General Date of Onset: 02/21/24 HPI: 63 y.o. male with hx of multiple prior strokes (R sided, with residual mild L hemiplegia), prostate cancer (s/p brachytherapy, 2018), polysubstance use (marijuana, crack, tobacco, alcohol), HTN, depression, BPH, presented as a code stroke for left gaze & right hemiplegia. MRI of head noted for Large early subacute infarct of the left MCA territory predominantly affecting  the posterior region. Small amount of petechial hemorrhage in the infarcted region. Type of Study: Bedside Swallow Evaluation Previous Swallow Assessment: none on file Diet Prior to this Study: NPO Temperature Spikes Noted: No Respiratory Status: Room air History of Recent Intubation: No Behavior/Cognition: Alert;Requires cueing;Confused Oral Cavity Assessment: Dry Oral Care Completed by SLP: Yes Oral Cavity - Dentition: Edentulous Vision: Functional for self-feeding Self-Feeding Abilities: Needs assist Patient Positioning: Upright in bed Baseline Vocal Quality: Normal Volitional Cough: Congested Volitional Swallow: Able to elicit    Oral/Motor/Sensory Function Overall Oral Motor/Sensory Function: Within functional limits   Ice Chips Ice chips: Impaired Oral Phase Functional Implications: Prolonged oral transit Pharyngeal Phase Impairments: Suspected delayed Swallow;Multiple swallows   Thin Liquid Thin Liquid: Impaired Presentation: Cup;Straw Oral Phase Impairments: Reduced lingual movement/coordination Pharyngeal  Phase Impairments: Suspected delayed Swallow;Multiple swallows    Nectar Thick Nectar Thick Liquid: Not tested   Honey Thick Honey Thick Liquid: Not tested   Puree Puree: Within functional limits   Solid     Solid: Impaired Presentation: Spoon Oral Phase Impairments: Reduced lingual movement/coordination Oral Phase Functional Implications: Prolonged oral transit Pharyngeal Phase Impairments:  Suspected delayed Swallow;Multiple swallows      Frank Fischer H. MA, CCC-SLP Acute Rehabilitation Services   02/22/2024,11:22 AM

## 2024-02-22 NOTE — Evaluation (Signed)
 Occupational Therapy Evaluation Patient Details Name: Frank Fischer MRN: [id number] DOB: September 16, 1960 Today's Date: 02/22/2024   History of Present Illness   Pt is a 63 y.o. male who presented 02/21/24 with L gaze and R-sided weakness. Pt received TNK. S/p L carotid terminus and M1 thrombectomy 10/4. MRI showed large early subacute left MCA territory infarct, predominantly posterior, with small petechial hemorrhage. PMH: multiple prior strokes (R sided, with residual mild L hemiplegia), prostate cancer (s/p brachytherapy, 2018), polysubstance use (marijuana, crack, tobacco, alcohol), HTN, depression, BPH     Clinical Impressions PTA, per pt report, he lived with spouse and was independent. Unsure of accuracy of report due to current receptive and expressive communication difficulties. Pt presents with decreased expressive>receptive language deficits, R sided weakness, decr proprioception and motor planning, decreased balance, and cognition. Pt needing up to mod A +2 for basic mobility and transfers as well as mod-total A for BADL. Due to significant functional decline, recommending intensive multidisciplinary rehabilitation >3 hours/day to optimize safety and independence in ADL.      If plan is discharge home, recommend the following:   Two people to help with walking and/or transfers;Two people to help with bathing/dressing/bathroom;Assistance with cooking/housework;Assistance with feeding;Direct supervision/assist for medications management;Direct supervision/assist for financial management;Assist for transportation;Help with stairs or ramp for entrance     Functional Status Assessment   Patient has had a recent decline in their functional status and demonstrates the ability to make significant improvements in function in a reasonable and predictable amount of time.     Equipment Recommendations   Other (comment) (defer)     Recommendations for Other Services   Rehab  consult;Speech consult     Precautions/Restrictions   Precautions Precautions: Fall Recall of Precautions/Restrictions: Impaired Precaution/Restrictions Comments: SBP < 160 Restrictions Weight Bearing Restrictions Per Provider Order: No     Mobility Bed Mobility Overal bed mobility: Needs Assistance Bed Mobility: Supine to Sit, Sit to Supine     Supine to sit: Mod assist, +2 for physical assistance, +2 for safety/equipment, HOB elevated Sit to supine: Mod assist, +2 for physical assistance, +2 for safety/equipment, HOB elevated   General bed mobility comments: Max multi-modal cues and gestures for pt to initiate sitting up L EOB, with pt needing modAx2 to manage trunk, scoot hips, and manage R extremities. ModAx2 needed to direct pt to lean to L to descend trunk and to lift legs to return to supine.    Transfers Overall transfer level: Needs assistance Equipment used: 2 person hand held assist Transfers: Sit to/from Stand, Bed to chair/wheelchair/BSC Sit to Stand: Min assist, +2 physical assistance, +2 safety/equipment     Step pivot transfers: Mod assist, +2 physical assistance, +2 safety/equipment     General transfer comment: Bil HHA provided with pt demonstrating good initiation to power up to stand, needing minAx2 to balance and power up from EOB. ModAx2 with bil HHA to shift weight bil to facilitate feet advancement to step L along EOB towards the [facility].      Balance Overall balance assessment: Needs assistance Sitting-balance support: No upper extremity supported, Feet supported Sitting balance-Leahy Scale: Fair Sitting balance - Comments: static sitting EOB, initially with modA due to R lean, but as neck stretched to midline from pt resting in R lateral flexion, his balance at his trunk improved to midline also, needing only CGA-minA for balance Postural control: Right lateral lean Standing balance support: Bilateral upper extremity supported, During functional  activity, Reliant on assistive device for balance Standing  balance-Leahy Scale: Poor Standing balance comment: reliant on bil HHA and external physical assistance                           ADL either performed or assessed with clinical judgement   ADL Overall ADL's : Needs assistance/impaired     Grooming: Moderate assistance;Sitting;Wash/dry face Grooming Details (indicate cue type and reason): due to decr grasp and poor proprioception/motor planning Upper Body Bathing: Moderate assistance   Lower Body Bathing: +2 for physical assistance;+2 for safety/equipment;Maximal assistance   Upper Body Dressing : Moderate assistance   Lower Body Dressing: +2 for physical assistance;+2 for safety/equipment;Maximal assistance   Toilet Transfer: Moderate assistance;+2 for physical assistance;+2 for safety/equipment                   Vision Patient Visual Report:  (pt did not report) Vision Assessment?: Vision impaired- to be further tested in functional context Additional Comments: R inattention vs visual field deficit?     Perception Perception: Impaired Preception Impairment Details: Inattention/Neglect (R)     Praxis Praxis: Impaired Praxis Impairment Details: Motor planning, Organization Praxis-Other Comments: poor proptioception, significant deficits noted with attempt to use RUE functionally; overshoots during washing face to L side of face   Pertinent Vitals/Pain Pain Assessment Pain Assessment: Faces Faces Pain Scale: Hurts a little bit Pain Location: grimacing intermittently with general mobility Pain Descriptors / Indicators: Grimacing Pain Intervention(s): Limited activity within patient's tolerance, Monitored during session     Extremity/Trunk Assessment Upper Extremity Assessment Upper Extremity Assessment: RUE deficits/detail;LUE deficits/detail RUE Deficits / Details: withdrew to noxious stim with delay, poor proprioception and motor control.  attempts to use LUE Deficits / Details: unsure if affected by prior CVA, but slowed initiation on L. Withdrew to noxious stimuli.   Lower Extremity Assessment Lower Extremity Assessment: Defer to PT evaluation RLE Deficits / Details: withdrew to noxious stimuli but with delay; grossly </= 3- strength; incoordination noted   Cervical / Trunk Assessment Cervical / Trunk Assessment: Normal   Communication Communication Communication: Impaired Factors Affecting Communication: Difficulty expressing self;Reduced clarity of speech   Cognition Arousal: Alert Behavior During Therapy: Flat affect Cognition: No family/caregiver present to determine baseline, Cognition impaired   Orientation impairments:  (appears to be oriented but difficulty with verbal expression. answers pretty consistently with yes/no) Awareness: Intellectual awareness impaired, Online awareness impaired   Attention impairment (select first level of impairment): Sustained attention (demos sustained attention for brief periods) Executive functioning impairment (select all impairments): Organization, Sequencing, Reasoning, Problem solving, Initiation OT - Cognition Comments: multimodal cues for problem solving in light of physical deficits throughout. pt follows one step commands inconsistently                 Following commands: Impaired Following commands impaired: Follows one step commands inconsistently, Follows one step commands with increased time     Cueing  General Comments   Cueing Techniques: Verbal cues;Tactile cues;Visual cues;Gestural cues  VSS on RA   Exercises Exercises: Other exercises Other Exercises Other Exercises: finger to nose x3 BUE   Shoulder Instructions      Home Living Family/patient expects to be discharged to:: Private residence Living Arrangements: Spouse/significant other Available Help at Discharge: Family   Home Access: Level entry     Home Layout: One level      Bathroom Shower/Tub: Tub/shower unit         Home Equipment:  (pt with difficulty reporting)   Additional  Comments: may need to confirm at later date due to expressive and receptive deficits, unsure of accuracy      Prior Functioning/Environment Prior Level of Function : Independent/Modified Independent             Mobility Comments: no AD ADLs Comments: does not drive    OT Problem List: Decreased strength;Impaired balance (sitting and/or standing);Decreased activity tolerance;Decreased coordination;Decreased cognition;Decreased safety awareness;Decreased knowledge of use of DME or AE;Decreased knowledge of precautions;Impaired sensation;Impaired UE functional use   OT Treatment/Interventions: Self-care/ADL training;Therapeutic exercise;DME and/or AE instruction;Cognitive remediation/compensation;Therapeutic activities;Visual/perceptual remediation/compensation;Patient/family education;Balance training      OT Goals(Current goals can be found in the care plan section)   Acute Rehab OT Goals OT Goal Formulation: With patient Time For Goal Achievement: 03/07/24 Potential to Achieve Goals: Good   OT Frequency:  Min 2X/week    Co-evaluation PT/OT/SLP Co-Evaluation/Treatment: Yes Reason for Co-Treatment: Complexity of the patient's impairments (multi-system involvement);Necessary to address cognition/behavior during functional activity;For patient/therapist safety;To address functional/ADL transfers PT goals addressed during session: Mobility/safety with mobility;Balance OT goals addressed during session: ADL's and self-care      AM-PAC OT 6 Clicks Daily Activity     Outcome Measure Help from another person eating meals?: A Lot Help from another person taking care of personal grooming?: A Lot Help from another person toileting, which includes using toliet, bedpan, or urinal?: Total Help from another person bathing (including washing, rinsing, drying)?: A Lot Help  from another person to put on and taking off regular upper body clothing?: A Lot Help from another person to put on and taking off regular lower body clothing?: Total 6 Click Score: 10   End of Session Equipment Utilized During Treatment: Gait belt Nurse Communication: Mobility status  Activity Tolerance: Patient tolerated treatment well Patient left: in bed;with call bell/phone within reach;with bed alarm set  OT Visit Diagnosis: Unsteadiness on feet (R26.81);Muscle weakness (generalized) (M62.81);Other symptoms and signs involving cognitive function;Low vision, both eyes (H54.2);Apraxia (R48.2);Hemiplegia and hemiparesis                Time: 8946-8877 OT Time Calculation (min): 29 min Charges:  OT General Charges $OT Visit: 1 Visit OT Evaluation $OT Eval Moderate Complexity: 1 Mod  Elma JONETTA Lebron FREDERICK, OTR/L [facility] Acute Rehabilitation Office: [phone]   Elma JONETTA Lebron 02/22/2024, 1:53 PM

## 2024-02-22 NOTE — Anesthesia Preprocedure Evaluation (Signed)
 Anesthesia Evaluation  Patient identified by MRN, date of birth, ID band Patient unresponsive    Reviewed: Allergy & Precautions, H&P , NPO status , Patient's Chart, lab work & pertinent test resultsPreop documentation limited or incomplete due to emergent nature of procedure.  Airway Mallampati: Unable to assess       Dental   Pulmonary Current Smoker   breath sounds clear to auscultation       Cardiovascular hypertension,  Rhythm:regular Rate:Normal     Neuro/Psych  Headaches Code stroke  Neuromuscular disease CVA    GI/Hepatic   Endo/Other    Renal/GU      Musculoskeletal   Abdominal   Peds  Hematology   Anesthesia Other Findings   Reproductive/Obstetrics                              Anesthesia Physical Anesthesia Plan  ASA: 3 and emergent  Anesthesia Plan: General   Post-op Pain Management:    Induction: Intravenous  PONV Risk Score and Plan: 1 and Ondansetron , Dexamethasone  and Treatment may vary due to age or medical condition  Airway Management Planned: Oral ETT  Additional Equipment:   Intra-op Plan:   Post-operative Plan: Extubation in OR  Informed Consent: I have reviewed the patients History and Physical, chart, labs and discussed the procedure including the risks, benefits and alternatives for the proposed anesthesia with the patient or authorized representative who has indicated his/her understanding and acceptance.     Dental advisory given  Plan Discussed with: CRNA, Anesthesiologist and Surgeon  Anesthesia Plan Comments:         Anesthesia Quick Evaluation

## 2024-02-22 NOTE — Progress Notes (Signed)
 NIR Progress Note:  POD#1 s/p Left carotid terminus and M1 thrombectomy  Assessment and Plan: Acute ischemic stroke - Left carotid and MCA intervention with interval ICA occlusion and preserved MCA perfusion. Access site clean and soft. No activity restriction from VIR standpoint. Continue optimal medical therapy, investigation of possible etiologies, and best supportive care. Please call with questions.   Overnight Events: More awake this morning with limited left sided movement.  MRA demonstrated interval Left ICA occlusion, and preserved left MCA with predominantly left posterior and basal ganglia infarct:  Pre-tx:    Post-tx:    Overnight MRA:        Subjective: Imperceptible words  Objective: Physical Exam: BP (!) 131/51 (BP Location: Right Arm)   Pulse (!) 45   Temp 99.1 F (37.3 C)   Resp 15   Wt 93 kg   SpO2 95%   BMI 29.42 kg/m   Awake, but not following commands Access site C/D/I Distal extremity warm and well perfused   Current Facility-Administered Medications:    0.9 %  sodium chloride  infusion, , Intravenous, Continuous, Bui, Xem M, MD, Last Rate: 75 mL/hr at 02/22/24 1000, Infusion Verify at 02/22/24 1000   acetaminophen  (TYLENOL ) tablet 650 mg, 650 mg, Oral, Q4H PRN **OR** acetaminophen  (TYLENOL ) 160 MG/5ML solution 650 mg, 650 mg, Per Tube, Q4H PRN **OR** acetaminophen  (TYLENOL ) suppository 650 mg, 650 mg, Rectal, Q4H PRN, Sallyann, Xem M, MD   albuterol  (PROVENTIL ) (2.5 MG/3ML) 0.083% nebulizer solution 2.5 mg, 2.5 mg, Nebulization, Q4H PRN, Bui, Xem M, MD   amLODipine  (NORVASC ) tablet 10 mg, 10 mg, Oral, Daily, Bui, Xem M, MD, 10 mg at 02/22/24 1033   aspirin  chewable tablet 81 mg, 81 mg, Oral, Daily, Waddell Aquas A, NP   atorvastatin  (LIPITOR ) tablet 80 mg, 80 mg, Oral, Daily, Waddell Aquas A, NP, 80 mg at 02/22/24 1033   buPROPion  ER (WELLBUTRIN  SR) 12 hr tablet 200 mg, 200 mg, Oral, BID WC, Bui, Xem M, MD, 200 mg at 02/22/24 1034   Chlorhexidine Gluconate  Cloth 2 % PADS 6 each, 6 each, Topical, Q0600, Bui, Xem M, MD, 6 each at 02/22/24 0600   ezetimibe  (ZETIA ) tablet 10 mg, 10 mg, Oral, Daily, Bui, Xem M, MD, 10 mg at 02/22/24 1033   folic acid  (FOLVITE ) tablet 1 mg, 1 mg, Oral, Daily, Bui, Xem M, MD, 1 mg at 02/22/24 1033   labetalol  (NORMODYNE ) injection 20 mg, 20 mg, Intravenous, Once PRN **AND** [DISCONTINUED] clevidipine (CLEVIPREX) infusion 0.5 mg/mL, 0-21 mg/hr, Intravenous, Continuous, Bui, Xem M, MD   lisinopril  (ZESTRIL ) tablet 40 mg, 40 mg, Oral, Daily, Bui, Xem M, MD, 40 mg at 02/22/24 1033   LORazepam  (ATIVAN ) injection 1-2 mg, 1-2 mg, Intravenous, Q1H PRN, Bui, Xem M, MD   multivitamin with minerals tablet 1 tablet, 1 tablet, Oral, Daily, Bui, Xem M, MD, 1 tablet at 02/22/24 1033   nicotine (NICODERM CQ - dosed in mg/24 hr) patch 7 mg, 7 mg, Transdermal, Daily, Bui, Xem M, MD, 7 mg at 02/22/24 1034   pantoprazole (PROTONIX) EC tablet 40 mg, 40 mg, Oral, QHS, Chen, Lydia D, RPH   senna-docusate (Senokot-S) tablet 1 tablet, 1 tablet, Oral, QHS PRN, Bui, Xem M, MD   tamsulosin  (FLOMAX ) capsule 0.4 mg, 0.4 mg, Oral, Daily, Bui, Xem M, MD, 0.4 mg at 02/22/24 1033   [DISCONTINUED] thiamine  (VITAMIN B1) injection 200 mg, 200 mg, Intravenous, Daily **OR** thiamine  (VITAMIN B1) tablet 100 mg, 100 mg, Oral, Daily, Bui, Xem M, MD, 100  mg at 02/22/24 1033  Labs: CBC Recent Labs    02/21/24 0834 02/22/24 0510  WBC 7.4 7.6  HGB 14.4 13.7  HCT 42.2 40.7  PLT 271 245   BMET Recent Labs    02/21/24 0834 02/22/24 0510  NA 141 139  K 3.9 3.8  CL 108 109  CO2 21* 23  GLUCOSE 95 96  BUN 6* 7*  CREATININE 0.95 1.03  CALCIUM  8.5* 8.2*   LFT Recent Labs    02/21/24 0035  PROT 6.9  ALBUMIN 4.0  AST 20  ALT 23  ALKPHOS 89  BILITOT 0.2   PT/INR Recent Labs    02/21/24 0035  LABPROT 13.5  INR 1.0    LOS: 1 day   I spent a total of 15 minutes in face to face in clinical consultation, greater than 50% of which was  counseling/coordinating care.  RAY COY 02/22/2024 11:56 AM

## 2024-02-23 ENCOUNTER — Encounter (HOSPITAL_COMMUNITY): Payer: Self-pay | Admitting: Radiology

## 2024-02-23 DIAGNOSIS — I779 Disorder of arteries and arterioles, unspecified: Secondary | ICD-10-CM | POA: Diagnosis not present

## 2024-02-23 DIAGNOSIS — I63512 Cerebral infarction due to unspecified occlusion or stenosis of left middle cerebral artery: Secondary | ICD-10-CM

## 2024-02-23 DIAGNOSIS — R29718 NIHSS score 18: Secondary | ICD-10-CM

## 2024-02-23 DIAGNOSIS — F1010 Alcohol abuse, uncomplicated: Secondary | ICD-10-CM

## 2024-02-23 DIAGNOSIS — F17210 Nicotine dependence, cigarettes, uncomplicated: Secondary | ICD-10-CM

## 2024-02-23 DIAGNOSIS — F1410 Cocaine abuse, uncomplicated: Secondary | ICD-10-CM

## 2024-02-23 DIAGNOSIS — R233 Spontaneous ecchymoses: Secondary | ICD-10-CM | POA: Diagnosis not present

## 2024-02-23 DIAGNOSIS — I69391 Dysphagia following cerebral infarction: Secondary | ICD-10-CM

## 2024-02-23 DIAGNOSIS — F1210 Cannabis abuse, uncomplicated: Secondary | ICD-10-CM

## 2024-02-23 DIAGNOSIS — E785 Hyperlipidemia, unspecified: Secondary | ICD-10-CM

## 2024-02-23 LAB — BASIC METABOLIC PANEL WITH GFR
Anion gap: 11 (ref 5–15)
BUN: 7 mg/dL — ABNORMAL LOW (ref 8–23)
CO2: 21 mmol/L — ABNORMAL LOW (ref 22–32)
Calcium: 8.3 mg/dL — ABNORMAL LOW (ref 8.9–10.3)
Chloride: 104 mmol/L (ref 98–111)
Creatinine, Ser: 1.04 mg/dL (ref 0.61–1.24)
GFR, Estimated: >60 mL/min (ref >60)
Glucose, Bld: 99 mg/dL (ref 70–99)
Potassium: 3.4 mmol/L — ABNORMAL LOW (ref 3.5–5.1)
Sodium: 136 mmol/L (ref 135–145)

## 2024-02-23 LAB — CBC WITH DIFFERENTIAL/PLATELET
Abs Immature Granulocytes: 0 K/uL (ref 0.00–0.07)
Basophils Absolute: 0 K/uL (ref 0.0–0.1)
Basophils Relative: 0 %
Eosinophils Absolute: 0 K/uL (ref 0.0–0.5)
Eosinophils Relative: 0 %
HCT: 39.7 % (ref 39.0–52.0)
Hemoglobin: 13.6 g/dL (ref 13.0–17.0)
Immature Granulocytes: 0 %
Lymphocytes Relative: 19 %
Lymphs Abs: 1.1 K/uL (ref 0.7–4.0)
MCH: 32.8 pg (ref 26.0–34.0)
MCHC: 34.3 g/dL (ref 30.0–36.0)
MCV: 95.7 fL (ref 80.0–100.0)
Monocytes Absolute: 0.6 K/uL (ref 0.1–1.0)
Monocytes Relative: 10 %
Neutro Abs: 4.1 K/uL (ref 1.7–7.7)
Neutrophils Relative %: 71 %
Platelets: 199 K/uL (ref 150–400)
RBC: 4.15 MIL/uL — ABNORMAL LOW (ref 4.22–5.81)
RDW: 14.1 % (ref 11.5–15.5)
WBC: 5.9 K/uL (ref 4.0–10.5)
nRBC: 0 % (ref 0.0–0.2)

## 2024-02-23 MED ORDER — LORAZEPAM 1 MG PO TABS
1.0000 mg | ORAL_TABLET | ORAL | Status: DC | PRN
  Administered 2024-02-25: 1 mg via ORAL
  Filled 2024-02-23: qty 1

## 2024-02-23 MED ORDER — THIAMINE HCL 100 MG/ML IJ SOLN
100.0000 mg | Freq: Every day | INTRAMUSCULAR | Status: DC
  Filled 2024-02-23: qty 2

## 2024-02-23 MED ORDER — ADULT MULTIVITAMIN W/MINERALS CH: 1.0000 | ORAL_TABLET | Freq: Every day | ORAL | Status: DC

## 2024-02-23 MED ORDER — LORAZEPAM 2 MG/ML IJ SOLN
1.0000 mg | INTRAMUSCULAR | Status: DC | PRN
  Administered 2024-02-23: 3 mg via INTRAVENOUS
  Administered 2024-02-23 – 2024-02-24 (×2): 2 mg via INTRAVENOUS
  Administered 2024-02-24: 3 mg via INTRAVENOUS
  Administered 2024-02-25: 4 mg via INTRAVENOUS
  Filled 2024-02-23 (×3): qty 2
  Filled 2024-02-23 (×2): qty 1

## 2024-02-23 MED ORDER — LORAZEPAM 2 MG/ML IJ SOLN: 1.0000 mg | INTRAMUSCULAR | Status: DC | PRN

## 2024-02-23 MED ORDER — FOLIC ACID 1 MG PO TABS: 1.0000 mg | ORAL_TABLET | Freq: Every day | ORAL | Status: DC

## 2024-02-23 MED ORDER — ENSURE PLUS HIGH PROTEIN PO LIQD
237.0000 mL | Freq: Two times a day (BID) | ORAL | Status: DC
Start: 2024-02-23 — End: 2024-02-27
  Administered 2024-02-23 – 2024-02-27 (×6): 237 mL via ORAL

## 2024-02-23 MED ORDER — THIAMINE MONONITRATE 100 MG PO TABS
100.0000 mg | ORAL_TABLET | Freq: Every day | ORAL | Status: DC
  Administered 2024-02-24 – 2024-02-27 (×4): 100 mg via ORAL
  Filled 2024-02-23 (×4): qty 1

## 2024-02-23 NOTE — Evaluation (Signed)
 Speech Language Pathology Evaluation Patient Details Name: Frank Fischer MRN: [id number] DOB: [birth year] Today's Date: 02/23/2024 Time: 8367-8359 SLP Time Calculation (min) (ACUTE ONLY): 8 min  Problem List:  Patient Active Problem List   Diagnosis Date Noted   Ischemic stroke (HCC) 02/21/2024   Immunization declined 07/22/2023   History of urinary retention 07/31/2022   Acute CVA (cerebrovascular accident) (HCC) 02/26/2019   Polysubstance abuse (HCC) 02/26/2019   Malignant neoplasm of prostate (HCC) 10/02/2017   Noncompliance with medication regimen 12/18/2016   Chronic tension type headache 10/20/2016   Headache 10/19/2013   Itching 10/19/2013   Smoker 10/19/2013   Essential hypertension, benign 07/08/2013   Pain in joint, shoulder region 07/08/2013   Cervical disc disorder with radiculopathy of cervical region 07/08/2013   Spinal stenosis in cervical region 07/07/2013   Tendinitis of left rotator cuff 07/07/2013   Past Medical History:  Past Medical History:  Diagnosis Date   Arthritis    Cervical stenosis of spine    Chronic headaches    Dental caries    periodontal disease   Frequency of urination    History of cerebral infarction 11-28-2017  per pt never had symptoms   per MRI imaging 10-31-2016 2 small chronic lacunar infartions in the right frontal periventricular white matter , and chronic left medial orbital fracture (as seen 2014 MRI)   Hypertension    Noncompliance with medication regimen    Prostate cancer [facility]) urologist-  dr winter/  oncologist-  dr patrcia   dx 07-21-2017--- Stage T1c, Gleason 4+3,  PSA 13.40,  vol 49.7cc--  scheduled for radioactive seed implants 12-03-2017   Past Surgical History:  Past Surgical History:  Procedure Laterality Date   ABDOMINAL EXPLORATION SURGERY  age 17   per pt had Small Bowel Resection and Appendectomy   APPENDECTOMY     ARM SURGERY Left 1999   REPAIR INJURY-- ARM CUT ABOUT OFF   IR CATHETER TUBE CHANGE   10/28/2021   IR CT HEAD LTD  02/21/2024   IR GUIDED DRAIN W CATHETER PLACEMENT  09/03/2021   IR PERCUTANEOUS ART THROMBECTOMY/INFUSION INTRACRANIAL INC DIAG ANGIO  02/21/2024   IR US  GUIDANCE  09/03/2021   IR US  GUIDE VASC ACCESS RIGHT  02/21/2024   PROSTATE BIOPSY  07/21/2017  dr winter office   RADIOACTIVE SEED IMPLANT N/A 12/03/2017   Procedure: RADIOACTIVE SEED IMPLANT/BRACHYTHERAPY IMPLANT;  Surgeon: Devere Lonni Righter, MD;  Location: [facility];  Service: Urology;  Laterality: N/A;  ONLY NEEDS 120 MIN   RADIOLOGY WITH ANESTHESIA N/A 02/21/2024   Procedure: RADIOLOGY WITH ANESTHESIA;  Surgeon: Radiologist, Medication, MD;  Location: [facility] OR;  Service: Radiology;  Laterality: N/A;   SPACE OAR INSTILLATION N/A 12/03/2017   Procedure: SPACE OAR INSTILLATION;  Surgeon: Devere Lonni Righter, MD;  Location: [facility];  Service: Urology;  Laterality: N/A;   TOOTH EXTRACTION N/A 09/21/2018   Procedure: DENTAL EXTRACTIONS WITH ALVEOLIPLASTY;  Surgeon: Sheryle Hamilton, DDS;  Location: [facility] OR;  Service: Oral Surgery;  Laterality: N/A;   HPI:  63 y.o. male with hx of multiple prior strokes (R sided, with residual mild L hemiplegia), prostate cancer (s/p brachytherapy, 2018), polysubstance use (marijuana, crack, tobacco, alcohol), HTN, depression, BPH, presented as a code stroke for left gaze & right hemiplegia. MRI of head noted for Large early subacute infarct of the left MCA territory predominantly affecting  the posterior region. Small amount of petechial hemorrhage in the infarcted region.   Assessment / Plan / Recommendation  Clinical Impression  Pt presents with receptive and expressive language deficits that are suspected to be impacted further by restlessness and agitation. He could not be redirected when pulling at lines despite receiving Ativan  ~1 hour prior to session. He is oriented to self only and typically answers yes when given binary choices. Pt was  increasingly accurate with basic biographical yes/no questions but accuracy decreased when they pertained to his direct environmental or were complex. He exhibits decreased awareness of situation and safety and is unable to sustain attention to attempts at problem solving and reasoning. The majority of his verbalizations were characterized either by perseveration or were unintelligible. Recommend intensive SLP f/u for ongoing assessment of cognitive-linguistic deficits.    SLP Assessment  SLP Recommendation/Assessment: Patient needs continued Speech Language Pathology Services SLP Visit Diagnosis: Aphasia (R47.01);Cognitive communication deficit (R41.841)     Assistance Recommended at Discharge  Frequent or constant Supervision/Assistance  Functional Status Assessment Patient has had a recent decline in their functional status and demonstrates the ability to make significant improvements in function in a reasonable and predictable amount of time.  Frequency and Duration min 2x/week  2 weeks      SLP Evaluation Cognition  Overall Cognitive Status: Impaired/Different from baseline Arousal/Alertness: Awake/alert Orientation Level: Oriented to person;Disoriented to place;Disoriented to time;Disoriented to situation Attention: Sustained Sustained Attention: Impaired Sustained Attention Impairment: Functional basic Awareness: Impaired Awareness Impairment: Intellectual impairment Problem Solving: Impaired Problem Solving Impairment: Functional basic       Comprehension  Auditory Comprehension Overall Auditory Comprehension: Impaired Yes/No Questions: Impaired Basic Biographical Questions: 76-100% accurate Basic Immediate Environment Questions: 0-24% accurate Complex Questions: 0-24% accurate Commands: Impaired One Step Basic Commands: 25-49% accurate    Expression Expression Primary Mode of Expression: Verbal Verbal Expression Overall Verbal Expression: Impaired Initiation: No  impairment Automatic Speech: Name Repetition: Impaired Level of Impairment: Word level Naming: Impairment Confrontation: Impaired   Oral / Motor  Oral Motor/Sensory Function Overall Oral Motor/Sensory Function: Within functional limits Motor Speech Overall Motor Speech: Appears within functional limits for tasks assessed            Damien Blumenthal, M.A., CCC-SLP Speech Language Pathology, Acute Rehabilitation Services  Secure Chat preferred [phone]  02/23/2024, 5:11 PM

## 2024-02-23 NOTE — Progress Notes (Signed)
 Physical Therapy Treatment Patient Details Name: Frank Fischer MRN: [id number] DOB: 19-Feb-1961 Today's Date: 02/23/2024   History of Present Illness Pt is a 63 y.o. male who presented 02/21/24 with L gaze and R-sided weakness. Pt received TNK. S/p L carotid terminus and M1 thrombectomy 10/4. MRI showed large early subacute left MCA territory infarct, predominantly posterior, with small petechial hemorrhage. PMH: multiple prior strokes (R sided, with residual mild L hemiplegia), prostate cancer (s/p brachytherapy, 2018), polysubstance use (marijuana, crack, tobacco, alcohol), HTN, depression, BPH    PT Comments  PT focus of session on seated and standing balance, pt with heavy R lateral truncal bias and difficult for pt to maintain correction to upright and midline. Pt requiring mod-max +1 for bed mobility, EOB sitting, and transfer OOB this date, max difficulty progressing RLE during dynamic standing tasks. Pt continues to be a good rehab candidate post-acutely, will continue to follow.      If plan is discharge home, recommend the following: A lot of help with bathing/dressing/bathroom;Assistance with cooking/housework;Direct supervision/assist for medications management;Direct supervision/assist for financial management;Assist for transportation;Help with stairs or ramp for entrance;Supervision due to cognitive status;A lot of help with walking and/or transfers   Can travel by private vehicle        Equipment Recommendations  Rolling walker (2 wheels);BSC/3in1;Wheelchair (measurements PT);Wheelchair cushion (measurements PT)    Recommendations for Other Services       Precautions / Restrictions Precautions Precautions: Fall Recall of Precautions/Restrictions: Impaired Precaution/Restrictions Comments: SBP < 160 Restrictions Weight Bearing Restrictions Per Provider Order: No     Mobility  Bed Mobility Overal bed mobility: Needs Assistance Bed Mobility: Supine to Sit     Supine  to sit: HOB elevated, Used rails, Max assist     General bed mobility comments: assist for trunk elevation, LE progression to EOB, correcting heavy R truncal bias    Transfers Overall transfer level: Needs assistance Equipment used: Rolling walker (2 wheels) Transfers: Sit to/from Stand, Bed to chair/wheelchair/BSC Sit to Stand: Mod assist   Step pivot transfers: Mod assist       General transfer comment: assist for power up, rise, steady. Good stability RLE with LLE marching, but pt with difficulty progressing RLE and requires PT assist for this    Ambulation/Gait         Gait velocity: decr     General Gait Details: unable given R lateral bias, pt requesting rest post-transfer   Stairs             Wheelchair Mobility     Tilt Bed    Modified Rankin (Stroke Patients Only) Modified Rankin (Stroke Patients Only) Pre-Morbid Rankin Score: No symptoms Modified Rankin: Moderately severe disability     Balance Overall balance assessment: Needs assistance Sitting-balance support: No upper extremity supported, Feet supported Sitting balance-Leahy Scale: Poor Sitting balance - Comments: heavy R lateral bias, requiring multiple PT assist bouts to correct Postural control: Right lateral lean Standing balance support: Bilateral upper extremity supported, During functional activity, Reliant on assistive device for balance Standing balance-Leahy Scale: Poor Standing balance comment: reliant on PT and RW support                            Communication Communication Communication: Impaired Factors Affecting Communication: Difficulty expressing self;Reduced clarity of speech  Cognition Arousal: Alert Behavior During Therapy: Flat affect   PT - Cognitive impairments: Difficult to assess, Attention, Awareness, Initiation, Sequencing, Problem solving, Safety/Judgement Difficult  to assess due to: Impaired communication                     PT -  Cognition Comments: R inattention and over/undershooting with reaching tasks esp if involving across midline to R and RUE. receptive and expressive deficits, appears accurate yes/no Following commands: Impaired Following commands impaired: Follows one step commands inconsistently, Follows one step commands with increased time    Cueing Cueing Techniques: Verbal cues, Tactile cues, Visual cues, Gestural cues  Exercises      General Comments General comments (skin integrity, edema, etc.): vss      Pertinent Vitals/Pain Pain Assessment Pain Assessment: No/denies pain    Home Living                          Prior Function            PT Goals (current goals can now be found in the care plan section) Acute Rehab PT Goals Patient Stated Goal: did not/unable to state PT Goal Formulation: Patient unable to participate in goal setting Time For Goal Achievement: 03/07/24 Potential to Achieve Goals: Good Progress towards PT goals: Progressing toward goals    Frequency    Min 3X/week      PT Plan      Co-evaluation              AM-PAC PT 6 Clicks Mobility   Outcome Measure  Help needed turning from your back to your side while in a flat bed without using bedrails?: A Lot Help needed moving from lying on your back to sitting on the side of a flat bed without using bedrails?: A Lot Help needed moving to and from a bed to a chair (including a wheelchair)?: Total Help needed standing up from a chair using your arms (e.g., wheelchair or bedside chair)?: Total Help needed to walk in hospital room?: Total Help needed climbing 3-5 steps with a railing? : Total 6 Click Score: 8    End of Session Equipment Utilized During Treatment: Gait belt Activity Tolerance: Patient tolerated treatment well;Patient limited by fatigue Patient left: with call bell/phone within reach;in chair;with chair alarm set Nurse Communication: Mobility status PT Visit Diagnosis:  Unsteadiness on feet (R26.81);Other symptoms and signs involving the nervous system (R29.898);Hemiplegia and hemiparesis Hemiplegia - Right/Left: Right Hemiplegia - dominant/non-dominant: Dominant Hemiplegia - caused by: Cerebral infarction     Time: 1001-1022 PT Time Calculation (min) (ACUTE ONLY): 21 min  Charges:    $Neuromuscular Re-education: 8-22 mins PT General Charges $$ ACUTE PT VISIT: 1 Visit                     Johana RAMAN, PT DPT Acute Rehabilitation Services Secure Chat Preferred  Office [phone]    Tranisha Tissue E Johna 02/23/2024, 10:58 AM

## 2024-02-23 NOTE — Progress Notes (Addendum)
 STROKE TEAM PROGRESS NOTE    SIGNIFICANT HOSPITAL EVENTS 10/3 presented as code stroke for left gaze and right hemiplegia received IV TNK and underwent left carotid terminus and M1 occlusion with TICI 3.  Admitted to the ICU  INTERIM HISTORY/SUBJECTIVE  No family at the bedside.   He is lying comfortably in bed.  Neurological exam is unchanged.  Vital signs stable. CBC    Component Value Date/Time   WBC 5.9 02/23/2024 0646   RBC 4.15 (L) 02/23/2024 0646   HGB 13.6 02/23/2024 0646   HGB 15.7 10/17/2016 1618   HCT 39.7 02/23/2024 0646   HCT 46.9 10/17/2016 1618   PLT 199 02/23/2024 0646   PLT 241 10/17/2016 1618   MCV 95.7 02/23/2024 0646   MCV 95 10/17/2016 1618   MCH 32.8 02/23/2024 0646   MCHC 34.3 02/23/2024 0646   RDW 14.1 02/23/2024 0646   RDW 15.6 (H) 10/17/2016 1618   LYMPHSABS 1.1 02/23/2024 0646   MONOABS 0.6 02/23/2024 0646   EOSABS 0.0 02/23/2024 0646   BASOSABS 0.0 02/23/2024 0646    BMET    Component Value Date/Time   NA 136 02/23/2024 0646   NA 144 10/17/2016 1618   K 3.4 (L) 02/23/2024 0646   CL 104 02/23/2024 0646   CO2 21 (L) 02/23/2024 0646   GLUCOSE 99 02/23/2024 0646   BUN 7 (L) 02/23/2024 0646   BUN 14 10/17/2016 1618   CREATININE 1.04 02/23/2024 0646   CREATININE 1.16 07/22/2023 1434   CALCIUM  8.3 (L) 02/23/2024 0646   EGFR 70 07/22/2023 1434   GFRNONAA >60 02/23/2024 0646   GFRNONAA 88 06/21/2013 1559    IMAGING past 24 hours No results found.   Vitals:   02/23/24 0816 02/23/24 0925 02/23/24 1200 02/23/24 1228  BP: (!) 151/98 (!) 142/91 (!) 140/76 124/81  Pulse: (!) 51 70    Resp:  13 18 20   Temp:      TempSrc:      SpO2:  98%  96%  Weight:         PHYSICAL EXAM General: Critically ill African-American male Psych:  Mood and affect appropriate for situation CV: Regular rate and rhythm on monitor Respiratory:  Regular, unlabored respirations on room air GI: Abdomen soft and nontender   NEURO:  Mental Status: More awake  and responsive today he is globally aphasic-dysarthric.  Able to follow some commands with cues slightly delayed  Cranial Nerves:  II: PERRL. Visual fields no blink to threat on right III, IV, VI: Left gaze preference can cross midline V: Sensation is intact to light touch and symmetrical to face.  VII: Face is asymmetric slight facial droop VIII: hearing intact to voice. IX, X: Palate elevates symmetrically. Phonation is normal.  KP:Dynloizm shrug 5/5. XII: tongue is midline without fasciculations. Motor: He is purposeful on the left, and antigravity with left arm and leg, right arm is antigravity, right leg with no movement. Tone: is normal and bulk is normal Sensation-decreased on right Coordination: Unable to assess Gait- deferred  Most Recent NIH18   ASSESSMENT/PLAN  Mr. KANI CHAUVIN is a 63 y.o. male with history of multiple prior strokes (R sided, with residual mild L hemiplegia), prostate cancer (s/p brachytherapy, 2018), polysubstance use (marijuana, crack, tobacco, alcohol), HTN, depression, BPH who presents as a code stroke for left gaze & right hemiplegia.  NIH on Admission 25  Acute Ischemic Infarct:  left MCA s/p mechanical thrombectomy and TNK of left carotid terminus and M1 occlusion with  TICI 3 Etiology: Large vessel disease, uncontrolled risk factors Code Stroke  CT head  Acute/early subacute left MCA territory infarct with focal loss of gray-white differentiation (ASPECTS 8) and hyperdense left MCA. Chronic infarcts in the right MCA and PCA territories  CTA head & neck large vessel occlusion of the left MCA  MRI large subacute left MCA infarct with small petechial hemorrhage MRA  left ICA occlusion 2D Echo EF 60 to 65%. LDL 111 HgbA1c 6.1 UDS positive for cocaine and THC VTE prophylaxis -SCDs No antithrombotic prior to admission, now on aspirin  81 mg daily alone given large size of stroke Therapy recommendations:  Pending Disposition: Pending  Hx of  Stroke/TIA Chronic infarcts in the right MCA and PCA territories  Hypertension Home meds: Amlodipine  10 mg, lisinopril  40 mg-appears patient is noncompliant and was not taking medication Stable Norvasc  and lisinopril  restarted Blood Pressure Goal: SBP 120-160 for first 24 hours then less than 180   Hyperlipidemia Home meds: Zetia  10 mg and atorvastatin  80 mg,  resumed in hospital-patient noted to be noncompliant with medication LDL 111, goal < 70 Continue statin at discharge   COPD Albuterol  as needed Maintain sats 88 to 92%  Tobacco Abuse Patient smokes cigarettes daily Assess willingness to quit when able Nicotine replacement therapy provided  Substance Abuse EtOH abuse Patient uses THC, cocaine Drinks 6 beers a day UDS ordered CIWA protocol Thiamine , folate, multivitamin ETOH use, alcohol level 130, advised to drink no more than 2 drink(s) a day Assess willingness to quit when able [facility] consult for cessation placed  Dysphagia Patient has post-stroke dysphagia, SLP consulted    Diet   DIET DYS 3 Room service appropriate? Yes with Assist; Fluid consistency: Thin    Other Stroke Risk Factors Chronic headaches History of prostate cancer   Other Active Problems Depression-Home bupropion  200 mg twice daily BPH-Flomax   Hospital day # 2 Harlene Pouch, MSN, NP-C [facility] See AMION or use Epic Chat  I have personally obtained history,examined this patient, reviewed notes, independently viewed imaging studies, participated in medical decision making and plan of care.ROS completed by me personally and pertinent positives fully documented  I have made any additions or clarifications directly to the above note. Agree with note above.  Continue ongoing therapies and transfer to inpatient rehab when bed available.  Eather Popp, MD Medical Director [facility] Pager: [phone] 02/23/2024 5:15 PM   To contact Stroke Continuity  provider, please refer to [email or web address]. After hours, contact General Neurology

## 2024-02-23 NOTE — Plan of Care (Signed)
  Problem: Education: Goal: Knowledge of patient specific risk factors will improve (DELETE if not current risk factor) Outcome: Progressing   Problem: Health Behavior/Discharge Planning: Goal: Goals will be collaboratively established with patient/family Outcome: Progressing   Problem: Nutrition: Goal: Risk of aspiration will decrease Outcome: Progressing Goal: Dietary intake will improve Outcome: Progressing

## 2024-02-23 NOTE — Progress Notes (Signed)
 Inpatient Rehab Coordinator Note:  I met with patient at bedside to discuss CIR recommendations and goals/expectations of CIR stay.  We reviewed 3 hrs/day of therapy, physician follow up, and average length of stay 2 weeks (dependent upon progress) with goals of supervision.  Pt very confused and unable to really provide meaningful insight into caregiver support or prior level of function.  I will need to touch base with family.  Per pt's RN, s/o works during the day.  Will follow.   Reche Lowers, PT, DPT Admissions Coordinator [phone] 02/23/24  4:20 PM

## 2024-02-23 NOTE — Progress Notes (Signed)
 Speech Language Pathology Treatment: Dysphagia  Patient Details Name: Frank Fischer MRN: [id number] DOB: May 06, 1961 Today's Date: 02/23/2024 Time: 8359-8351 SLP Time Calculation (min) (ACUTE ONLY): 8 min  Assessment / Plan / Recommendation Clinical Impression  RN reports pt's intake has been limited today despite offering choices that he would normally enjoy. He declined solids during session but took large, successive straw sips without signs clinically concerning for aspiration. Continue current diet and SLP will f/u at least briefly in an attempt to more thoroughly assess performance with solids.    HPI HPI: 63 y.o. male with hx of multiple prior strokes (R sided, with residual mild L hemiplegia), prostate cancer (s/p brachytherapy, 2018), polysubstance use (marijuana, crack, tobacco, alcohol), HTN, depression, BPH, presented as a code stroke for left gaze & right hemiplegia. MRI of head noted for Large early subacute infarct of the left MCA territory predominantly affecting  the posterior region. Small amount of petechial hemorrhage in the infarcted region.      SLP Plan  Continue with current plan of care  Patient needs continued Speech Language Pathology Services       Recommendations  Diet recommendations: Dysphagia 3 (mechanical soft);Thin liquid Liquids provided via: Cup;Straw Medication Administration: Whole meds with puree Supervision: Staff to assist with self feeding Compensations: Minimize environmental distractions;Slow rate;Small sips/bites Postural Changes and/or Swallow Maneuvers: Seated upright 90 degrees                  Oral care BID   Frequent or constant Supervision/Assistance Dysphagia, unspecified (R13.10)     Continue with current plan of care     Damien Blumenthal, M.A., CCC-SLP Speech Language Pathology, Acute Rehabilitation Services  Secure Chat preferred [phone]   02/23/2024, 5:14 PM

## 2024-02-24 DIAGNOSIS — R29718 NIHSS score 18: Secondary | ICD-10-CM | POA: Diagnosis not present

## 2024-02-24 DIAGNOSIS — I63511 Cerebral infarction due to unspecified occlusion or stenosis of right middle cerebral artery: Secondary | ICD-10-CM

## 2024-02-24 DIAGNOSIS — I10 Essential (primary) hypertension: Secondary | ICD-10-CM

## 2024-02-24 DIAGNOSIS — R233 Spontaneous ecchymoses: Secondary | ICD-10-CM | POA: Diagnosis not present

## 2024-02-24 DIAGNOSIS — F10939 Alcohol use, unspecified with withdrawal, unspecified: Secondary | ICD-10-CM

## 2024-02-24 DIAGNOSIS — F10931 Alcohol use, unspecified with withdrawal delirium: Secondary | ICD-10-CM | POA: Diagnosis not present

## 2024-02-24 DIAGNOSIS — Z720 Tobacco use: Secondary | ICD-10-CM | POA: Diagnosis not present

## 2024-02-24 DIAGNOSIS — I639 Cerebral infarction, unspecified: Secondary | ICD-10-CM

## 2024-02-24 DIAGNOSIS — F1990 Other psychoactive substance use, unspecified, uncomplicated: Secondary | ICD-10-CM | POA: Diagnosis not present

## 2024-02-24 DIAGNOSIS — I779 Disorder of arteries and arterioles, unspecified: Secondary | ICD-10-CM | POA: Diagnosis not present

## 2024-02-24 NOTE — Plan of Care (Signed)
  Problem: Ischemic Stroke/TIA Tissue Perfusion: Goal: Complications of ischemic stroke/TIA will be minimized Outcome: Progressing   Problem: Health Behavior/Discharge Planning: Goal: Goals will be collaboratively established with patient/family Outcome: Progressing   Problem: Self-Care: Goal: Ability to communicate needs accurately will improve Outcome: Progressing   Problem: Nutrition: Goal: Risk of aspiration will decrease Outcome: Progressing   Problem: Education: Goal: Knowledge of General Education information will improve Description: Including pain rating scale, medication(s)/side effects and non-pharmacologic comfort measures Outcome: Progressing   Problem: Clinical Measurements: Goal: Ability to maintain clinical measurements within normal limits will improve Outcome: Progressing Goal: Will remain free from infection Outcome: Progressing Goal: Diagnostic test results will improve Outcome: Progressing Goal: Respiratory complications will improve Outcome: Progressing Goal: Cardiovascular complication will be avoided Outcome: Progressing   Problem: Activity: Goal: Risk for activity intolerance will decrease Outcome: Progressing   Problem: Elimination: Goal: Will not experience complications related to bowel motility Outcome: Progressing Goal: Will not experience complications related to urinary retention Outcome: Progressing   Problem: Pain Managment: Goal: General experience of comfort will improve and/or be controlled Outcome: Progressing   Problem: Safety: Goal: Ability to remain free from injury will improve Outcome: Progressing   Problem: Skin Integrity: Goal: Risk for impaired skin integrity will decrease Outcome: Progressing

## 2024-02-24 NOTE — Care Management Important Message (Signed)
 Important Message  Patient Details  Name: Frank Fischer MRN: [id number] Date of Birth: [birth year]   Important Message Given:  Yes - Medicare IM     Jon Cruel 02/24/2024, 2:30 PM

## 2024-02-24 NOTE — Consult Note (Signed)
 Initial Consultation Note   Patient: Frank Fischer DOB: 1960/07/23 PCP: Frank Roxan BROCKS, NP DOA: 02/21/2024 DOS: the patient was seen and examined on 02/24/2024 Primary service: Stroke, Md, Fischer  Referring physician: Dr. Eather Fischer, Frank Geralds, NP  Reason for consult: alcohol withdrawal  Assessment and Plan:  Concern for acute alcohol withdrawal - History of alcohol dependence drinking at least 6 beers a day.  Noted ethanol 130 on presentation 10/4.  Overnight noting more restlessness/agitation, tachycardia, hypertension, concerns for alcohol withdrawal.  Would agree with initiation of CIWA protocol with as needed Ativan , vitamin supplementation.  Can consider p.o. Librium 50 mg 3 times daily with subsequent decreasing taper over 5-7 days to replace or supplement IV CIWA.  Should IV Ativan  not provide enough sedation to control withdrawal/DTs, may consider transitioning to ICU for further sedation with Precedex or other sedation.  Acute ischemic CVA left MCA - S/p mechanical thrombectomy and TNK left carotid terminus and M1 occlusion.  Managed closely by neurology.  Currently on aspirin  daily.  PT following closely.  Therapy recommendations CIR.  Hypertension - Continues on Norvasc  10 mg daily plus lisinopril  40 mg daily.  Blood pressure appears well-controlled.  Hyperlipidemia - High-dose atorvastatin  and Zetia  on board.  Tobacco abuse - Nicotine patch on board.  Encouraged cessation.  Polysubstance abuse - Noted tox screen on presentation positive for cocaine and marijuana.  Ethanol on board as well.  Likely contributing factors to patient's CVA.  Encourage cessation.  Poststroke dysphagia - Evaluated by SLP.  Patient declining solids but able to take large sips of sips of liquids without signs of aspiration.  Continues on mechanical soft (dysphagia 3) with thin liquids.  BPH - Flomax  on board.   TRH will continue to follow the patient.  HPI: Frank Fischer  is a 63 y.o. 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  Subsequently treated with TNK and thrombectomy was transition to progressive care.  Today, patient is noted to have increasing agitation and restlessness, tachycardia, hypertension, giving concerns for possible alcohol withdrawal.  Patient's history of drinking at least 6 beers a day along with other polysubstance use including cocaine, marijuana, tobacco.  Upon evaluation in the room, patient appears calm at this time.  Has aphasia with leftward gaze, does not answer questions, only grunts/moans in response.  Does not follow commands.  Otherwise alert.  Patient did receive IV Ativan  just prior to evaluation.  Review of Systems: As mentioned in the history of present illness. All other systems reviewed and are negative. Past Medical History:  Diagnosis Date   Arthritis    Cervical stenosis of spine    Chronic headaches    Dental caries    periodontal disease   Frequency of urination    History of cerebral infarction 11-28-2017  per pt never had symptoms   per MRI imaging 10-31-2016 2 small chronic lacunar infartions in the right frontal periventricular white matter , and chronic left medial orbital fracture (as seen 2014 MRI)   Hypertension    Noncompliance with medication regimen    Prostate cancer [facility] urologist-  Frank Fischer/  oncologist-  Frank Fischer   dx 07-21-2017--- Stage T1c, Gleason 4+3,  PSA 13.40,  vol 49.7cc--  scheduled for radioactive seed implants 12-03-2017   Past Surgical History:  Procedure Laterality Date  ABDOMINAL EXPLORATION SURGERY  age 62   per pt had Small Bowel Resection and Appendectomy   APPENDECTOMY     ARM SURGERY Left 1999   REPAIR INJURY-- ARM CUT ABOUT OFF   IR CATHETER TUBE  CHANGE  10/28/2021   IR CT HEAD LTD  02/21/2024   IR GUIDED DRAIN W CATHETER PLACEMENT  09/03/2021   IR PERCUTANEOUS ART THROMBECTOMY/INFUSION INTRACRANIAL INC DIAG ANGIO  02/21/2024   IR US  GUIDANCE  09/03/2021   IR US  GUIDE VASC ACCESS RIGHT  02/21/2024   PROSTATE BIOPSY  07/21/2017  Frank Fischer office   RADIOACTIVE SEED IMPLANT N/A 12/03/2017   Procedure: RADIOACTIVE SEED IMPLANT/BRACHYTHERAPY IMPLANT;  Surgeon: Frank Lonni Righter, Fischer;  [facility];  Service: Urology;  Laterality: N/A;  ONLY NEEDS 120 MIN   RADIOLOGY WITH ANESTHESIA N/A 02/21/2024   Procedure: RADIOLOGY WITH ANESTHESIA;  Surgeon: Frank Fischer;  Location: [facility] OR;  Service: Radiology;  Laterality: N/A;   SPACE OAR INSTILLATION N/A 12/03/2017   Procedure: SPACE OAR INSTILLATION;  Surgeon: Frank Lonni Righter, Fischer;  Location: [facility];  Service: Urology;  Laterality: N/A;   TOOTH EXTRACTION N/A 09/21/2018   Procedure: DENTAL EXTRACTIONS WITH ALVEOLIPLASTY;  Surgeon: Frank Fischer, DDS;  Location: [facility] OR;  Service: Oral Surgery;  Laterality: N/A;   Social History:  reports that he has been smoking cigarettes. He has a 10 pack-year smoking history. He has never used smokeless tobacco. He reports current alcohol use of about 21.0 standard drinks of alcohol per week. He reports current drug use. Drug: Marijuana.  Allergies  Allergen Reactions   Penicillins Itching    Family History  Problem Relation Age of Onset   Lung cancer Mother    Hypertension Brother    Diabetes Paternal Grandfather    Prostate cancer Cousin     Prior to Admission medications   Medication Sig Start Date End Date Taking? Authorizing Provider  albuterol  (VENTOLIN  HFA) 108 (90 Base) MCG/ACT inhaler Inhale 2 puffs into the lungs every 6 (six) hours as needed for wheezing or shortness of breath. 07/22/23  Yes Ngetich, Dinah C, NP  atorvastatin  (LIPITOR ) 80 MG tablet Take 1 tablet (80 mg total) by  mouth daily. 07/22/23  Yes Ngetich, Dinah C, NP  ezetimibe  (ZETIA ) 10 MG tablet Take 1 tablet (10 mg total) by mouth daily. 08/07/23  Yes Ngetich, Dinah C, NP  lisinopril  (ZESTRIL ) 40 MG tablet Take 1 tablet (40 mg total) by mouth every morning. 07/22/23  Yes Ngetich, Dinah C, NP  naproxen  (NAPROSYN ) 375 MG tablet Take 1 tablet (375 mg total) by mouth 2 (two) times daily with a meal. 01/10/24  Yes Horton, Kristie M, DO  triamcinolone  cream (KENALOG ) 0.1 % Apply 1 Application topically 2 (two) times daily. Patient taking differently: Apply 1 Application topically 2 (two) times daily as needed (skin irritation). 07/22/23  Yes Ngetich, Dinah C, NP  amLODipine  (NORVASC ) 10 MG tablet Take 1 tablet (10 mg total) by mouth daily. Patient not taking: Reported on 02/21/2024 07/22/23   Ngetich, Dinah C, NP  aspirin  EC 81 MG tablet Take 81 mg by mouth daily. Patient not taking: Reported on 02/21/2024    Provider, Historical, Fischer  buPROPion  (WELLBUTRIN  SR) 200 MG 12 hr tablet Take 1 tablet (200 mg total) by mouth 2 (two) times daily. Patient not taking: Reported on 02/21/2024 07/22/23   Ngetich, Dinah C, NP  tamsulosin  (FLOMAX ) 0.4 MG CAPS capsule Take 1 capsule (0.4 mg total) by mouth  daily. Patient not taking: Reported on 02/21/2024 07/22/23   Ngetich, Roxan BROCKS, NP    Physical Exam: Vitals:   02/24/24 0300 02/24/24 0445 02/24/24 1630 02/24/24 1631  BP: 123/70 129/81 107/83   Pulse: 62 89 (!) 130 (!) 130  Resp: 17 (!) 22 (!) 28   Temp: 98.4 F (36.9 Fischer)     TempSrc: Axillary     SpO2: 99% 99%    Weight:       GENERAL:  Alert, no acute distress, ill-appearing HEENT:  EOMI, leftward gaze CARDIOVASCULAR:  RRR, no murmurs appreciated RESPIRATORY:  Clear to auscultation, no wheezing, rales, or rhonchi GASTROINTESTINAL:  Soft, nontender, nondistended EXTREMITIES:  No LE edema bilaterally NEURO: Aphasic/dysarthric, leftward gaze, moving upper extremities spontaneously SKIN:  No rashes noted PSYCH: Unable to  assess  Data Reviewed:   There are no new results to review at this time.    Family Communication: None at bedside Primary team communication: Frank Geralds, NP Thank you very much for involving us  in the care of your patient.  Author: Carliss LELON Canales, DO 02/24/2024 5:49 PM  For on call review [email or web address].

## 2024-02-24 NOTE — Plan of Care (Signed)
  Problem: Nutrition: Goal: Risk of aspiration will decrease Outcome: Progressing   Problem: Pain Managment: Goal: General experience of comfort will improve and/or be controlled Outcome: Progressing

## 2024-02-24 NOTE — Progress Notes (Signed)
 10/7 I delivered IMM Letter to patient, Patient unable to receive information. I called patient's brother Minnie Shi @ [phone] and received verbal confirmation telephonically.

## 2024-02-24 NOTE — Progress Notes (Addendum)
 Inpatient Rehab Admissions Coordinator:   Attempted to reach spouse via phone but no answer and no VM set up.  I was able to reach pt's brother and I explained recommendations for inpatient rehab with ELOS ~2 weeks with goals of 24/7 supervision. Brother reports spouse works and pt does not have someone that could be with him 24/7 for supervision.  He feels pt will likely need SNF.  I notified TOC/Stroke team.    1125: I was able to speak to pt's spouse, Vena, on the phone.  She is a PCA 3hrs/week, but is able to take time off to care for [name] or bring him with her.  She confirms she can provide 24/7 care for him after ~2 weeks on rehab.  Pt now eligible for Medicare and medicaid so we reviewed those benefits.  I don't have a bed for him today, but will update once bed available.  I notified TOC and stroke service.    Reche Lowers, PT, DPT Admissions Coordinator [phone] 02/24/24  11:16 AM

## 2024-02-24 NOTE — Progress Notes (Signed)
   02/24/24 1630  Vitals  BP 107/83  MAP (mmHg) 91  BP Location Right Arm  BP Method Automatic  Patient Position (if appropriate) Lying  Pulse Rate (!) 130  Pulse Rate Source Monitor  ECG Heart Rate (!) 120  Resp (!) 28   Patient sustaining HR in the 130s. Found sitting in chair visibly agitated, attempting to remove mittens and telemetry leads. Unable to reorient patient. 3mg  Ativan  administered IV per PRN CIWA parameters. On call stroke Physician notified by secure chat and Amion, will continue to monitor.

## 2024-02-24 NOTE — TOC Initial Note (Signed)
 Transition of Care (TOC) - Initial/Assessment Note  Rayfield Gobble RN,BSN Inpatient Care Management Unit 4NP (Non Trauma)- RN Case Manager See Treatment Team for direct Phone #   Patient Details  Name: Frank Fischer MRN: [id number] Date of Birth: 30-Oct-1960  Transition of Care [facility]) CM/SW Contact:    Gobble Rayfield Hurst, RN Phone Number: 02/24/2024, 2:34 PM  Clinical Narrative:                 Admitted with CVA, from home with sig.other.  Note CIR following for admit when bed available.  No IP CM needs noted at this time  Expected Discharge Plan: IP Rehab Facility Barriers to Discharge: Other (must enter comment) (Awaiting CIR bed)   Patient Goals and CMS Choice Patient states their goals for this hospitalization and ongoing recovery are:: return home after rehab   Choice offered to / list presented to : Patient (SO)      Expected Discharge Plan and Services   Discharge Planning Services: CM Consult Post Acute Care Choice: IP Rehab Living arrangements for the past 2 months: Single Family Home                 DME Arranged: N/A DME Agency: NA       HH Arranged: NA HH Agency: NA        Prior Living Arrangements/Services Living arrangements for the past 2 months: Single Family Home Lives with:: Self, Significant Other Patient language and need for interpreter reviewed:: Yes Do you feel safe going back to the place where you live?: Yes      Need for Family Participation in Patient Care: Yes (Comment) Care giver support system in place?: Yes (comment)   Criminal Activity/Legal Involvement Pertinent to Current Situation/Hospitalization: No - Comment as needed  Activities of Daily Living      Permission Sought/Granted                  Emotional Assessment Appearance:: Appears stated age Attitude/Demeanor/Rapport: Lethargic Affect (typically observed): Unable to Assess Orientation: : Oriented to Self Alcohol / Substance Use: Not Applicable Psych  Involvement: No (comment)  Admission diagnosis:  Ischemic stroke [facility]) [I63.9] Cerebrovascular accident (CVA), unspecified mechanism (HCC) [I63.9] Patient Active Problem List   Diagnosis Date Noted   Ischemic stroke (HCC) 02/21/2024   Immunization declined 07/22/2023   History of urinary retention 07/31/2022   Acute CVA (cerebrovascular accident) (HCC) 02/26/2019   Polysubstance abuse (HCC) 02/26/2019   Malignant neoplasm of prostate (HCC) 10/02/2017   Noncompliance with medication regimen 12/18/2016   Chronic tension type headache 10/20/2016   Headache 10/19/2013   Itching 10/19/2013   Smoker 10/19/2013   Essential hypertension, benign 07/08/2013   Pain in joint, shoulder region 07/08/2013   Cervical disc disorder with radiculopathy of cervical region 07/08/2013   Spinal stenosis in cervical region 07/07/2013   Tendinitis of left rotator cuff 07/07/2013   PCP:  Leonarda Roxan BROCKS, NP Pharmacy:   [facility] - [place], [place] - [address] [place] [address] Phone: [phone] Fax: [phone]  [id number] - [place], [place] - [address] AT [facility] [address] [facility] [place] [address] Phone: [phone] Fax: [phone]     Social Drivers of Health (SDOH) Social History: SDOH Screenings   Food Insecurity: No Food Insecurity (02/21/2024)  Housing: Low Risk  (02/21/2024)  Transportation Needs: No Transportation Needs (09/13/2022)  Utilities: Not At  Risk (09/13/2022)  Depression (PHQ2-9): Low Risk  (03/12/2023)  Financial Resource Strain: Medium Risk (09/13/2022)  Social Connections: Unknown (03/04/2019)  Tobacco Use: High Risk (02/21/2024)   SDOH Interventions:     Readmission Risk Interventions     No data to display

## 2024-02-24 NOTE — Progress Notes (Addendum)
 STROKE TEAM PROGRESS NOTE   SIGNIFICANT HOSPITAL EVENTS 10/3 presented as code stroke for left gaze and right hemiplegia received IV TNK and underwent left carotid terminus and M1 occlusion with TICI 3.  Admitted to the ICU   INTERIM HISTORY/SUBJECTIVE No family at the bedside.  No new neurological events overnight Neurological exam remains stable and unchanged  CBC    Component Value Date/Time   WBC 5.9 02/23/2024 0646   RBC 4.15 (L) 02/23/2024 0646   HGB 13.6 02/23/2024 0646   HGB 15.7 10/17/2016 1618   HCT 39.7 02/23/2024 0646   HCT 46.9 10/17/2016 1618   PLT 199 02/23/2024 0646   PLT 241 10/17/2016 1618   MCV 95.7 02/23/2024 0646   MCV 95 10/17/2016 1618   MCH 32.8 02/23/2024 0646   MCHC 34.3 02/23/2024 0646   RDW 14.1 02/23/2024 0646   RDW 15.6 (H) 10/17/2016 1618   LYMPHSABS 1.1 02/23/2024 0646   MONOABS 0.6 02/23/2024 0646   EOSABS 0.0 02/23/2024 0646   BASOSABS 0.0 02/23/2024 0646    BMET    Component Value Date/Time   NA 136 02/23/2024 0646   NA 144 10/17/2016 1618   K 3.4 (L) 02/23/2024 0646   CL 104 02/23/2024 0646   CO2 21 (L) 02/23/2024 0646   GLUCOSE 99 02/23/2024 0646   BUN 7 (L) 02/23/2024 0646   BUN 14 10/17/2016 1618   CREATININE 1.04 02/23/2024 0646   CREATININE 1.16 07/22/2023 1434   CALCIUM  8.3 (L) 02/23/2024 0646   EGFR 70 07/22/2023 1434   GFRNONAA >60 02/23/2024 0646   GFRNONAA 88 06/21/2013 1559    IMAGING past 24 hours No results found.  Vitals:   02/24/24 0043 02/24/24 0045 02/24/24 0300 02/24/24 0445  BP: 124/75 124/75 123/70 129/81  Pulse: (!) 59  62 89  Resp: 18  17 (!) 22  Temp:   98.4 F (36.9 C)   TempSrc:   Axillary   SpO2: 99%  99% 99%  Weight:         PHYSICAL EXAM General: Critically ill African-American male Psych:  Mood and affect appropriate for situation CV: Regular rate and rhythm on monitor Respiratory:  Regular, unlabored respirations on room air GI: Abdomen soft and nontender     NEURO:  Mental  Status: lethargic, arouses he is globally aphasic-dysarthric.  Able to follow some commands with cues slightly delayed   Cranial Nerves:  II: PERRL. Visual fields no blink to threat on right III, IV, VI: Left gaze preference can cross midline V: Sensation is intact to light touch and symmetrical to face.  VII: Face is asymmetric slight facial droop VIII: hearing intact to voice. IX, X: Palate elevates symmetrically. Phonation is normal.  KP:Dynloizm shrug 5/5. XII: tongue is midline without fasciculations. Motor: He is purposeful on the left, and antigravity with left arm and leg, right arm is antigravity, right leg with no movement. Tone: is normal and bulk is normal Sensation-decreased on right Coordination: Unable to assess Gait- deferred   Most Recent NIH18     ASSESSMENT/PLAN   [name] is a 63 y.o. male with history of multiple prior strokes (R sided, with residual mild L hemiplegia), prostate cancer (s/p brachytherapy, 2018), polysubstance use (marijuana, crack, tobacco, alcohol), HTN, depression, BPH who presents as a code stroke for left gaze & right hemiplegia.  NIH on Admission 25   Acute Ischemic Infarct:  left MCA s/p mechanical thrombectomy and TNK of left carotid terminus and M1 occlusion with  TICI 3 Etiology: Large vessel disease, uncontrolled risk factors Code Stroke  CT head  Acute/early subacute left MCA territory infarct with focal loss of gray-white differentiation (ASPECTS 8) and hyperdense left MCA. Chronic infarcts in the right MCA and PCA territories  CTA head & neck large vessel occlusion of the left MCA  MRI large subacute left MCA infarct with small petechial hemorrhage MRA  left ICA occlusion 2D Echo EF 60 to 65%. LDL 111 HgbA1c 6.1 UDS positive for cocaine and THC VTE prophylaxis -SCDs No antithrombotic prior to admission, now on aspirin  81 mg daily alone given large size of stroke Therapy recommendations:  CIR Disposition: Pending   Hx  of Stroke/TIA Chronic infarcts in the right MCA and PCA territories   Hypertension Home meds: Amlodipine  10 mg, lisinopril  40 mg-appears patient is noncompliant and was not taking medication Stable Norvasc  and lisinopril  restarted Blood Pressure Goal: SBP 120-160 for first 24 hours then less than 180    Hyperlipidemia Home meds: Zetia  10 mg and atorvastatin  80 mg,  resumed in hospital-patient noted to be noncompliant with medication LDL 111, goal < 70 Continue statin at discharge     COPD Albuterol  as needed Maintain sats 88 to 92%   Tobacco Abuse Patient smokes cigarettes daily Assess willingness to quit when able Nicotine replacement therapy provided   Substance Abuse EtOH abuse Patient uses THC, cocaine Drinks 6 beers a day UDS ordered CIWA protocol Thiamine , folate, multivitamin ETOH use, alcohol level 130, advised to drink no more than 2 drink(s) a day Assess willingness to quit when able [facility] consult for cessation placed   Dysphagia Patient has post-stroke dysphagia, SLP consulted  Other Stroke Risk Factors Chronic headaches History of prostate cancer     Other Active Problems Depression-Home bupropion  200 mg twice daily BPH-Flomax     Hospital day # 3  [name] DNP, ACNPC-AG  [facility]  I have personally obtained history,examined this patient, reviewed notes, independently viewed imaging studies, participated in medical decision making and plan of care.ROS completed by me personally and pertinent positives fully documented  I have made any additions or clarifications directly to the above note. Agree with note above.    Eather Popp, MD Medical Director [facility] Pager: [phone] 02/24/2024 4:38 PM   To contact Stroke Continuity provider, please refer to [email or web address]. After hours, contact General Neurology

## 2024-02-24 NOTE — Progress Notes (Signed)
 Physical Therapy Treatment Patient Details Name: Frank Fischer MRN: [id number] DOB: [birth year] Today's Date: 02/24/2024   History of Present Illness Pt is a 63 y.o. male who presented 02/21/24 with L gaze and R-sided weakness. Pt received TNK. S/p L carotid terminus and M1 thrombectomy 10/4. MRI showed large early subacute left MCA territory infarct, predominantly posterior, with small petechial hemorrhage. PMH: multiple prior strokes (R sided, with residual mild L hemiplegia), prostate cancer (s/p brachytherapy, 2018), polysubstance use (marijuana, crack, tobacco, alcohol), HTN, depression, BPH    PT Comments  PT focus of session on repeated transfer training, upright posture as pt with heavy R lateral bias, and pre-gait tasks. Pt tolerating x3 transfers into standing, pt demonstrating great initiation when given demonstrative and visual cues. Pt with significant R inattention with R lateral bias in sitting and standing, benefits from max cuing on R side to attend to R. Pt with no overt RLE buckling, but reluctant to place full weight on RLE for stepping. Pt progressing well, remains a good inpatient rehab candidate.      If plan is discharge home, recommend the following: A lot of help with walking and/or transfers;A lot of help with bathing/dressing/bathroom   Can travel by private vehicle        Equipment Recommendations  Rolling walker (2 wheels);BSC/3in1;Wheelchair (measurements PT);Wheelchair cushion (measurements PT)    Recommendations for Other Services       Precautions / Restrictions Precautions Precautions: Fall Recall of Precautions/Restrictions: Impaired Precaution/Restrictions Comments: SBP < 160, bilat mitts Restrictions Weight Bearing Restrictions Per Provider Order: No     Mobility  Bed Mobility Overal bed mobility: Needs Assistance Bed Mobility: Supine to Sit     Supine to sit: HOB elevated, Mod assist     General bed mobility comments: trunk and LE  management, scooting to EOB, correcting R lateral bias    Transfers Overall transfer level: Needs assistance Equipment used: Rolling walker (2 wheels), 2 person hand held assist Transfers: Sit to/from Stand Sit to Stand: Mod assist   Step pivot transfers: Mod assist, +2 physical assistance       General transfer comment: assist for power up, rise, steady. Repeated stands x3, step pivot x1 towards L with facilitation RLE progression and at trunk.    Ambulation/Gait             Pre-gait activities: weight shift L and R at hips, RLE marching with PT facilitating, finding upright and midline General Gait Details: focused on transfers and pregait   Stairs             Wheelchair Mobility     Tilt Bed    Modified Rankin (Stroke Patients Only) Modified Rankin (Stroke Patients Only) Pre-Morbid Rankin Score: No symptoms Modified Rankin: Moderately severe disability     Balance Overall balance assessment: Needs assistance Sitting-balance support: No upper extremity supported, Feet supported Sitting balance-Leahy Scale: Poor Sitting balance - Comments: heavy R lateral bias, requiring max external cues to correct and mod physical assist Postural control: Right lateral lean Standing balance support: Bilateral upper extremity supported, During functional activity, Reliant on assistive device for balance Standing balance-Leahy Scale: Poor Standing balance comment: reliant on PT and RW support                            Communication Communication Communication: Impaired Factors Affecting Communication: Difficulty expressing self;Reduced clarity of speech  Cognition Arousal: Alert Behavior During Therapy: Flat affect  PT - Cognitive impairments: Difficult to assess, Attention, Awareness, Initiation, Sequencing, Problem solving, Safety/Judgement Difficult to assess due to: Impaired communication                     PT - Cognition Comments:  expressive and receptive difficulties, responds best to demonstrative cues. fidgeting and pulling at lines/leads Following commands: Impaired Following commands impaired: Follows one step commands inconsistently, Follows one step commands with increased time    Cueing Cueing Techniques: Verbal cues, Tactile cues, Visual cues, Gestural cues  Exercises General Exercises - Lower Extremity Long Arc Quad: AAROM, Right, 5 reps, Seated Hip Flexion/Marching: AAROM, Right, 5 reps, Seated    General Comments General comments (skin integrity, edema, etc.): vss      Pertinent Vitals/Pain Pain Assessment Pain Assessment: Faces Faces Pain Scale: No hurt Pain Intervention(s): Monitored during session    Home Living                          Prior Function            PT Goals (current goals can now be found in the care plan section) Acute Rehab PT Goals Patient Stated Goal: did not/unable to state PT Goal Formulation: Patient unable to participate in goal setting Time For Goal Achievement: 03/07/24 Potential to Achieve Goals: Good Progress towards PT goals: Progressing toward goals    Frequency    Min 3X/week      PT Plan      Co-evaluation              AM-PAC PT 6 Clicks Mobility   Outcome Measure  Help needed turning from your back to your side while in a flat bed without using bedrails?: A Lot Help needed moving from lying on your back to sitting on the side of a flat bed without using bedrails?: A Lot Help needed moving to and from a bed to a chair (including a wheelchair)?: A Lot Help needed standing up from a chair using your arms (e.g., wheelchair or bedside chair)?: A Lot Help needed to walk in hospital room?: Total Help needed climbing 3-5 steps with a railing? : Total 6 Click Score: 10    End of Session Equipment Utilized During Treatment: Gait belt Activity Tolerance: Patient tolerated treatment well;Patient limited by fatigue Patient left:  with call bell/phone within reach;in chair;with chair alarm set (posey belt chair alarm) Nurse Communication: Mobility status PT Visit Diagnosis: Unsteadiness on feet (R26.81);Other symptoms and signs involving the nervous system (R29.898);Hemiplegia and hemiparesis Hemiplegia - Right/Left: Right Hemiplegia - dominant/non-dominant: Dominant Hemiplegia - caused by: Cerebral infarction     Time: 1412-1440 PT Time Calculation (min) (ACUTE ONLY): 28 min  Charges:    $Therapeutic Activity: 8-22 mins $Neuromuscular Re-education: 8-22 mins PT General Charges $$ ACUTE PT VISIT: 1 Visit                     Johana RAMAN, PT DPT Acute Rehabilitation Services Secure Chat Preferred  Office [phone]    Tawnia Schirm E Johna 02/24/2024, 4:25 PM

## 2024-02-25 DIAGNOSIS — I639 Cerebral infarction, unspecified: Secondary | ICD-10-CM | POA: Diagnosis not present

## 2024-02-25 DIAGNOSIS — R233 Spontaneous ecchymoses: Secondary | ICD-10-CM | POA: Diagnosis not present

## 2024-02-25 DIAGNOSIS — R29718 NIHSS score 18: Secondary | ICD-10-CM | POA: Diagnosis not present

## 2024-02-25 DIAGNOSIS — I779 Disorder of arteries and arterioles, unspecified: Secondary | ICD-10-CM | POA: Diagnosis not present

## 2024-02-25 DIAGNOSIS — I63511 Cerebral infarction due to unspecified occlusion or stenosis of right middle cerebral artery: Secondary | ICD-10-CM | POA: Diagnosis not present

## 2024-02-25 MED ORDER — LORAZEPAM 2 MG/ML IJ SOLN
1.0000 mg | INTRAMUSCULAR | Status: DC | PRN
Start: 2024-02-25 — End: 2024-02-27
  Administered 2024-02-26 – 2024-02-27 (×3): 1 mg via INTRAVENOUS
  Filled 2024-02-25 (×4): qty 1

## 2024-02-25 MED ORDER — PHENOBARBITAL 32.4 MG PO TABS: 32.4000 mg | ORAL_TABLET | Freq: Three times a day (TID) | ORAL | Status: DC

## 2024-02-25 MED ORDER — POTASSIUM CHLORIDE CRYS ER 20 MEQ PO TBCR
40.0000 meq | EXTENDED_RELEASE_TABLET | Freq: Once | ORAL | Status: AC
  Administered 2024-02-25: 40 meq via ORAL
  Filled 2024-02-25: qty 2

## 2024-02-25 MED ORDER — PHENOBARBITAL 32.4 MG PO TABS
64.8000 mg | ORAL_TABLET | Freq: Three times a day (TID) | ORAL | Status: DC
  Administered 2024-02-27: 64.8 mg via ORAL
  Filled 2024-02-25: qty 2

## 2024-02-25 MED ORDER — PHENOBARBITAL 32.4 MG PO TABS
97.2000 mg | ORAL_TABLET | Freq: Three times a day (TID) | ORAL | Status: AC
  Administered 2024-02-25 – 2024-02-27 (×6): 97.2 mg via ORAL
  Filled 2024-02-25 (×6): qty 3

## 2024-02-25 NOTE — Progress Notes (Addendum)
 STROKE TEAM PROGRESS NOTE   SIGNIFICANT HOSPITAL EVENTS 10/3 presented as code stroke for left gaze and right hemiplegia received IV TNK and underwent left carotid terminus and M1 occlusion with TICI 3.  Admitted to the ICU   INTERIM HISTORY/SUBJECTIVE No family at the bedside. Yesterday patient became very restless and agitated, with increased RR and HR, concern for withdrawal even though on CIWA protocol and receiving ativan  which was not helping yesterday. Medicine team consulted to help with CIWA management  Patient is calm this morning, following commands. Neurological exam remains stable and unchanged    CBC    Component Value Date/Time   WBC 5.9 02/23/2024 0646   RBC 4.15 (L) 02/23/2024 0646   HGB 13.6 02/23/2024 0646   HGB 15.7 10/17/2016 1618   HCT 39.7 02/23/2024 0646   HCT 46.9 10/17/2016 1618   PLT 199 02/23/2024 0646   PLT 241 10/17/2016 1618   MCV 95.7 02/23/2024 0646   MCV 95 10/17/2016 1618   MCH 32.8 02/23/2024 0646   MCHC 34.3 02/23/2024 0646   RDW 14.1 02/23/2024 0646   RDW 15.6 (H) 10/17/2016 1618   LYMPHSABS 1.1 02/23/2024 0646   MONOABS 0.6 02/23/2024 0646   EOSABS 0.0 02/23/2024 0646   BASOSABS 0.0 02/23/2024 0646    BMET    Component Value Date/Time   NA 136 02/23/2024 0646   NA 144 10/17/2016 1618   K 3.4 (L) 02/23/2024 0646   CL 104 02/23/2024 0646   CO2 21 (L) 02/23/2024 0646   GLUCOSE 99 02/23/2024 0646   BUN 7 (L) 02/23/2024 0646   BUN 14 10/17/2016 1618   CREATININE 1.04 02/23/2024 0646   CREATININE 1.16 07/22/2023 1434   CALCIUM  8.3 (L) 02/23/2024 0646   EGFR 70 07/22/2023 1434   GFRNONAA >60 02/23/2024 0646   GFRNONAA 88 06/21/2013 1559    IMAGING past 24 hours No results found.  Vitals:   02/25/24 0930 02/25/24 1145 02/25/24 1215 02/25/24 1246  BP:  (!) 138/103 137/87 112/82  Pulse: 93 (!) 129 (!) 124 (!) 115  Resp:   (!) 26   Temp:      TempSrc:      SpO2:      Weight:         PHYSICAL EXAM General: Critically ill  African-American male Psych:  Mood and affect appropriate for situation CV: Regular rate and rhythm on monitor Respiratory:  Regular, unlabored respirations on room air GI: Abdomen soft and nontender     NEURO:  Mental Status: lethargic, arouses he is globally aphasic-dysarthric.  Able to follow some commands with cues slightly delayed   Cranial Nerves:  II: PERRL. Visual fields no blink to threat on right III, IV, VI: Left gaze preference can cross midline V: Sensation is intact to light touch and symmetrical to face.  VII: Face is asymmetric slight facial droop VIII: hearing intact to voice. IX, X: Palate elevates symmetrically. Phonation is normal.  KP:Dynloizm shrug 5/5. XII: tongue is midline without fasciculations. Motor: He is purposeful on the left, and antigravity with left arm and leg, right arm is antigravity, right leg with no movement. Tone: is normal and bulk is normal Sensation-decreased on right Coordination: Unable to assess Gait- deferred   Most Recent NIH18     ASSESSMENT/PLAN   Mr. NAVEEN CLARDY is a 63 y.o. male with history of multiple prior strokes (R sided, with residual mild L hemiplegia), prostate cancer (s/p brachytherapy, 2018), polysubstance use (marijuana, crack, tobacco, alcohol), HTN,  depression, BPH who presents as a code stroke for left gaze & right hemiplegia.  NIH on Admission 25   Acute Ischemic Infarct:  left MCA s/p mechanical thrombectomy and TNK of left carotid terminus and M1 occlusion with TICI 3 Etiology: Large vessel disease, uncontrolled risk factors Code Stroke  CT head  Acute/early subacute left MCA territory infarct with focal loss of gray-white differentiation (ASPECTS 8) and hyperdense left MCA. Chronic infarcts in the right MCA and PCA territories  CTA head & neck large vessel occlusion of the left MCA  MRI large subacute left MCA infarct with small petechial hemorrhage MRA  left ICA occlusion 2D Echo EF 60 to 65%. LDL  111 HgbA1c 6.1 UDS positive for cocaine and THC VTE prophylaxis -SCDs No antithrombotic prior to admission, now on aspirin  81 mg daily alone given large size of stroke Therapy recommendations:  CIR Disposition: Pending   Hx of Stroke/TIA Chronic infarcts in the right MCA and PCA territories   Hypertension Home meds: Amlodipine  10 mg, lisinopril  40 mg-appears patient is noncompliant and was not taking medication Stable Norvasc  and lisinopril  restarted Blood Pressure Goal: SBP 120-160 for first 24 hours then less than 180    Hyperlipidemia Home meds: Zetia  10 mg and atorvastatin  80 mg,  resumed in hospital-patient noted to be noncompliant with medication LDL 111, goal < 70 Continue statin at discharge     COPD Albuterol  as needed Maintain sats 88 to 92%   Tobacco Abuse Patient smokes cigarettes daily Assess willingness to quit when able Nicotine replacement therapy provided   Substance Abuse EtOH abuse Patient uses THC, cocaine Drinks 6 beers a day UDS ordered CIWA protocol Thiamine , folate, multivitamin ETOH use, alcohol level 130, advised to drink no more than 2 drink(s) a day Assess willingness to quit when able [facility] consult for cessation placed   Dysphagia Patient has post-stroke dysphagia, SLP consulted  Other Stroke Risk Factors Chronic headaches History of prostate cancer     Other Active Problems Depression-Home bupropion  200 mg twice daily BPH-Flomax     Hospital day # 4  [name] DNP, ACNPC-AG  [facility]  I have personally obtained history,examined this patient, reviewed notes, independently viewed imaging studies, participated in medical decision making and plan of care.ROS completed by me personally and pertinent positives fully documented  I have made any additions or clarifications directly to the above note. Agree with note above.    Eather Popp, MD Medical Director [facility] Pager: [phone] 02/25/2024  4:49 PM   To contact Stroke Continuity provider, please refer to [email or web address]. After hours, contact General Neurology

## 2024-02-25 NOTE — Progress Notes (Addendum)
 PROGRESS NOTE  FRAZIER BALFOUR FMW:[id number] DOB: 1960/07/23 DOA: 02/21/2024 PCP: Leonarda Roxan BROCKS, NP   LOS: 4 days   Brief Narrative / Interim history: Frank Fischer is a 63 y.o. 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  Subsequently treated with TNK and thrombectomy was transition to progressive care.  Today, patient is noted to have increasing agitation and restlessness, tachycardia, hypertension, giving concerns for possible alcohol withdrawal.  Patient's history of drinking at least 6 beers a day along with other polysubstance use including cocaine, marijuana, tobacco.   Subjective / 24h Interval events: He is alert, wakes up, appears slightly confused but given aphasia/dysarthria this is unclear.  Assesement and Plan: Principal problem Concern for acute alcohol withdrawal - History of alcohol dependence drinking at least 6 beers a day.  Noted ethanol 130 on presentation 10/4.  Hospitalist team consulted 10/7 due to increased restlessness/agitation, bradycardia with concern for alcohol withdrawal.  Has been placed on CIWA - No significant withdrawals this morning.  Could consider scheduled p.o. Librium but would like to hold for now given that he looks calm this morning, following commands, without any significant symptoms   Active problems Acute ischemic CVA left MCA - S/p mechanical thrombectomy and TNK left carotid terminus and M1 occlusion.  Underwent full stroke workup with 2D echocardiogram LVEF 65 to 65%, LDL 111, A1c 6.1, currently on aspirin  81 mg.  CIR is recommended   Hypertension - Continues on Norvasc  10 mg daily plus lisinopril  40 mg daily.  Pressure stable   Hyperlipidemia -  High-dose atorvastatin  and Zetia  on board.   Tobacco abuse -continue  nicotine patch  Hypokalemia-replenish potassium  Polysubstance abuse - Noted tox screen on presentation positive for cocaine and marijuana.    Poststroke dysphagia - Evaluated by SLP.  Patient declining solids but able to take large sips of sips of liquids without signs of aspiration.  Continues on mechanical soft (dysphagia 3) with thin liquids.   BPH -continue Flomax   Scheduled Meds:  amLODipine   10 mg Oral Daily   aspirin   81 mg Oral Daily   atorvastatin   80 mg Oral Daily   buPROPion  ER  200 mg Oral BID WC   Chlorhexidine Gluconate Cloth  6 each Topical Q0600   enoxaparin  (LOVENOX ) injection  40 mg Subcutaneous Daily   ezetimibe   10 mg Oral Daily   feeding supplement  237 mL Oral BID BM   folic acid   1 mg Oral Daily   lisinopril   40 mg Oral Daily   multivitamin with minerals  1 tablet Oral Daily   nicotine  7 mg Transdermal Daily   tamsulosin   0.4 mg Oral Daily   thiamine   100 mg Oral Daily   Or   thiamine   100 mg Intravenous Daily   Continuous Infusions: PRN Meds:.acetaminophen  **OR** acetaminophen  (TYLENOL ) oral liquid 160 mg/5 mL **OR** acetaminophen , albuterol , labetalol  **AND** [DISCONTINUED] clevidipine, LORazepam  **OR** LORazepam , senna-docusate  Current Outpatient Medications  Medication Instructions   albuterol  (VENTOLIN  HFA) 108 (90 Base) MCG/ACT inhaler 2 puffs, Inhalation, Every 6 hours PRN   amLODipine  (NORVASC ) 10 mg, Oral, Daily   aspirin  EC 81 mg, Daily   atorvastatin  (LIPITOR ) 80 mg, Oral, Daily   buPROPion  (WELLBUTRIN  SR) 200 mg, Oral, 2 times daily   ezetimibe  (ZETIA ) 10 mg, Oral, Daily   lisinopril  (  ZESTRIL ) 40 mg, Oral, Every morning   naproxen  (NAPROSYN ) 375 mg, Oral, 2 times daily with meals   tamsulosin  (FLOMAX ) 0.4 mg, Oral, Daily   triamcinolone  cream (KENALOG ) 0.1 % 1 Application, Topical, 2 times daily    Diet Orders (From admission, onward)     Start     Ordered   02/22/24 1044  DIET DYS 3 Room service appropriate? Yes with Assist; Fluid  consistency: Thin  Diet effective now       Comments: Meds whole in puree  Question Answer Comment  Room service appropriate? Yes with Assist   Fluid consistency: Thin      02/22/24 1043            DVT prophylaxis: enoxaparin  (LOVENOX ) injection 40 mg Start: 02/22/24 1515 SCD's Start: 02/21/24 0045   Lab Results  Component Value Date   PLT 199 02/23/2024      Code Status: Full Code  Family Communication: No family at bedside  Status is: Inpatient Remains inpatient appropriate because: Severity of illness   Level of care: Telemetry Medical  Objective: Vitals:   02/25/24 0304 02/25/24 0445 02/25/24 0759 02/25/24 0800  BP: 132/82 123/78 (!) 141/93 (!) 141/93  Pulse: 74  95 81  Resp: 20  16   Temp: 98.1 F (36.7 C)  98.2 F (36.8 C)   TempSrc: Oral  Oral   SpO2: 99%  99%   Weight:        Intake/Output Summary (Last 24 hours) at 02/25/2024 1040 Last data filed at 02/25/2024 0845 Gross per 24 hour  Intake 50 ml  Output 250 ml  Net -200 ml   Wt Readings from Last 3 Encounters:  02/21/24 93 kg  01/10/24 95.3 kg  07/22/23 95.3 kg    Examination:  Constitutional: NAD Eyes: no scleral icterus ENMT: Mucous membranes are moist.  Neck: normal, supple Respiratory: clear to auscultation bilaterally, no wheezing, no crackles.  Cardiovascular: Regular rate and rhythm, no murmurs / rubs / gallops.  Abdomen: non distended, no tenderness. Bowel sounds positive.  Musculoskeletal: no clubbing / cyanosis.  Skin: no rashes Neurologic: Right-sided weakness   Data Reviewed: I have independently reviewed following labs and imaging studies   CBC Recent Labs  Lab 02/21/24 0035 02/21/24 0036 02/21/24 0834 02/22/24 0510 02/23/24 0646  WBC 5.4  --  7.4 7.6 5.9  HGB 15.4 15.6 14.4 13.7 13.6  HCT 46.1 46.0 42.2 40.7 39.7  PLT 258  --  271 245 199  MCV 95.4  --  94.0 95.1 95.7  MCH 31.9  --  32.1 32.0 32.8  MCHC 33.4  --  34.1 33.7 34.3  RDW 14.0  --  14.4 14.5  14.1  LYMPHSABS 1.1  --  1.4 2.1 1.1  MONOABS 0.3  --  0.6 0.9 0.6  EOSABS 0.0  --  0.0 0.0 0.0  BASOSABS 0.0  --  0.0 0.0 0.0    Recent Labs  Lab 02/21/24 0035 02/21/24 0036 02/21/24 0834 02/21/24 0848 02/22/24 0510 02/23/24 0646  NA 138 141 141  --  139 136  K 3.8 3.8 3.9  --  3.8 3.4*  CL 105 106 108  --  109 104  CO2 18*  --  21*  --  23 21*  GLUCOSE 114* 115* 95  --  96 99  BUN 8 9 6*  --  7* 7*  CREATININE 0.96 1.10 0.95  --  1.03 1.04  CALCIUM  8.8*  --  8.5*  --  8.2* 8.3*  AST 20  --   --   --   --   --   ALT 23  --   --   --   --   --   ALKPHOS 89  --   --   --   --   --   BILITOT 0.2  --   --   --   --   --   ALBUMIN 4.0  --   --   --   --   --   INR 1.0  --   --   --   --   --   HGBA1C  --   --   --  5.7*  --   --     ------------------------------------------------------------------------------------------------------------------ No results for input(s): CHOL, HDL, LDLCALC, TRIG, CHOLHDL, LDLDIRECT in the last 72 hours.  Lab Results  Component Value Date   HGBA1C 5.7 (H) 02/21/2024   ------------------------------------------------------------------------------------------------------------------ No results for input(s): TSH, T4TOTAL, T3FREE, THYROIDAB in the last 72 hours.  Invalid input(s): FREET3  Cardiac Enzymes No results for input(s): CKMB, TROPONINI, MYOGLOBIN in the last 168 hours.  Invalid input(s): CK ------------------------------------------------------------------------------------------------------------------ No results found for: BNP  CBG: Recent Labs  Lab 02/21/24 0032 02/22/24 1548 02/22/24 2016  GLUCAP 117* 111* 113*    Recent Results (from the past 240 hours)  Resp panel by RT-PCR (RSV, Flu A&B, Covid) Anterior Nasal Swab     Status: None   Collection Time: 02/21/24  1:11 AM   Specimen: Anterior Nasal Swab  Result Value Ref Range Status   SARS Coronavirus 2 by RT PCR NEGATIVE NEGATIVE  Final   Influenza A by PCR NEGATIVE NEGATIVE Final   Influenza B by PCR NEGATIVE NEGATIVE Final    Comment: (NOTE) The Xpert Xpress SARS-CoV-2/FLU/RSV plus assay is intended as an aid in the diagnosis of influenza from Nasopharyngeal swab specimens and should not be used as a sole basis for treatment. Nasal washings and aspirates are unacceptable for Xpert Xpress SARS-CoV-2/FLU/RSV testing.  Fact Sheet for Patients: BloggerCourse.com  Fact Sheet for Healthcare Providers: SeriousBroker.it  This test is not yet approved or cleared by the [place]  FDA and has been authorized for detection and/or diagnosis of SARS-CoV-2 by FDA under an Emergency Use Authorization (EUA). This EUA will remain in effect (meaning this test can be used) for the duration of the COVID-19 declaration under Section 564(b)(1) of the Act, 21 U.S.C. section 360bbb-3(b)(1), unless the authorization is terminated or revoked.     Resp Syncytial Virus by PCR NEGATIVE NEGATIVE Final    Comment: (NOTE) Fact Sheet for Patients: BloggerCourse.com  Fact Sheet for Healthcare Providers: SeriousBroker.it  This test is not yet approved or cleared by the [place]  FDA and has been authorized for detection and/or diagnosis of SARS-CoV-2 by FDA under an Emergency Use Authorization (EUA). This EUA will remain in effect (meaning this test can be used) for the duration of the COVID-19 declaration under Section 564(b)(1) of the Act, 21 U.S.C. section 360bbb-3(b)(1), unless the authorization is terminated or revoked.  Performed at [facility], [address], [place], [place] [address]   MRSA Next Gen by PCR, Nasal     Status: None   Collection Time: 02/21/24  6:14 AM   Specimen: Nasal Swab  Result Value Ref Range Status   MRSA by PCR Next Gen NOT DETECTED NOT DETECTED Final    Comment: (NOTE) The GeneXpert  MRSA Assay (FDA approved for NASAL specimens only),  is one component of a comprehensive MRSA colonization surveillance program. It is not intended to diagnose MRSA infection nor to guide or monitor treatment for MRSA infections. Test performance is not FDA approved in patients less than 24 years old. Performed at [facility], [address], [place], [place] [address]      Radiology Studies: No results found.   Nilda Fendt, MD, PhD [facility]  Between 7 am - 7 pm I am available, please contact me via Amion (for emergencies) or Securechat (non urgent messages)  Between 7 pm - 7 am I am not available, please contact night coverage MD/APP via Amion

## 2024-02-25 NOTE — Progress Notes (Signed)
 Inpatient Rehab Admissions Coordinator:   I have no beds available for this patient to admit to CIR today.  Will continue to follow for timing of potential admission pending bed availability.   Reche Lowers, PT, DPT Admissions Coordinator [phone] 02/25/24  9:19 AM

## 2024-02-25 NOTE — Progress Notes (Signed)
 Speech Language Pathology Treatment: Dysphagia;Cognitive-Linguistic  Patient Details Name: Frank Fischer MRN: [id number] DOB: 08/17/1960 Today's Date: 02/25/2024 Time: 8891-8873 SLP Time Calculation (min) (ACUTE ONLY): 18 min  Assessment / Plan / Recommendation Clinical Impression  Session completed with OT to address functional goals. His verbal output is generally limited to the word level and is characterized by social language and yes/no; however, was inaccurate with basic biographical yes/no questions. He was positioned upright at EOB and fed himself regular solids and thin liquids. He remains inattentive to his R side and cannot track to midline but made progress towards self-feeding today with both hands. With regular solids, there is R pocketing that pt can clear with a liquid wash but is otherwise, generally unaware. One instance of significantly delayed coughing was observed with thin liquids but this did not recur with large volumes. Recommend continuing current diet with full supervision to monitor for R buccal pocketing. SLP will f/u.    HPI HPI: 63 y.o. male with hx of multiple prior strokes (R sided, with residual mild L hemiplegia), prostate cancer (s/p brachytherapy, 2018), polysubstance use (marijuana, crack, tobacco, alcohol), HTN, depression, BPH, presented as a code stroke for left gaze & right hemiplegia. MRI of head noted for Large early subacute infarct of the left MCA territory predominantly affecting  the posterior region. Small amount of petechial hemorrhage in the infarcted region.      SLP Plan  Continue with current plan of care          Recommendations  Diet recommendations: Dysphagia 3 (mechanical soft);Thin liquid Liquids provided via: Cup;Straw Medication Administration: Whole meds with puree Supervision: Staff to assist with self feeding Compensations: Minimize environmental distractions;Slow rate;Small sips/bites Postural Changes and/or Swallow  Maneuvers: Seated upright 90 degrees                  Oral care BID   Frequent or constant Supervision/Assistance Dysphagia, unspecified (R13.10);Aphasia (R47.01);Cognitive communication deficit (M58.158)     Continue with current plan of care     Damien Blumenthal, M.A., CCC-SLP Speech Language Pathology, Acute Rehabilitation Services  Secure Chat preferred [phone]   02/25/2024, 11:54 AM

## 2024-02-25 NOTE — Progress Notes (Addendum)
   02/25/24 1200  Provider Notification  Provider Name/Title Dr. Trixie, MD  Date Provider Notified 02/25/24  Time Provider Notified 1200  Method of Notification Page  Notification Reason Change in status (CIWA 21. Pt agitated, tachycardic, restless, and attmpeting to get OOB.)  Provider response Evaluate remotely;See new orders  Date of Provider Response 02/25/24  Time of Provider Response 1211   Pt attempting to get OOB and remove telemetry equipment and mittens. Pt appears to be agitated, restless, and unable to follow commands. HR sustaining in 120s and respiratory rate in mid 20s. CIWA 21. Ativan  administered per CIWA protocol. RN attempted to reorient pt and therapeutically communicate current care plan. Dr. Trixie notified. New orders received.

## 2024-02-25 NOTE — Progress Notes (Signed)
 Occupational Therapy Treatment Patient Details Name: Frank Fischer MRN: [id number] DOB: [birth year] Today's Date: 02/25/2024   History of present illness Pt is a 63 y.o. male who presented 02/21/24 with L gaze and R-sided weakness. Pt received TNK. S/p L carotid terminus and M1 thrombectomy 10/4. MRI showed large early subacute left MCA territory infarct, predominantly posterior, with small petechial hemorrhage. PMH: multiple prior strokes (R sided, with residual mild L hemiplegia), prostate cancer (s/p brachytherapy, 2018), polysubstance use (marijuana, crack, tobacco, alcohol), HTN, depression, BPH   OT comments  Pt progressing towards OT goals this session. Initially lethargic, but with mod A came EOB where he was more alert. Continued to demonstrate R lateral and posterior lean requiring at least mod A to correct. Pt seemed to improve lean after a few min of leaning on L elbow to left. Pt engaged in taking objects from central vision with L and R hands (significantly more cues and assist for R hand - and would frequently use L hand to take from R) for eating/grooming. Pt with cues will attend to R side of face/head. Pt able to complete multiple sit<>stand EOB with mod A+2 - with improved performance each repetition. Pt HR initially around 100, with activity was jumping up to 130 and stayed elevated and so with side steps up bed to left with mod A +2 returned supine. OT will continue to follow acutely and continue to recommend post acute rehab of >3 hours daily.       If plan is discharge home, recommend the following:  Two people to help with walking and/or transfers;Two people to help with bathing/dressing/bathroom;Assistance with cooking/housework;Assistance with feeding;Direct supervision/assist for medications management;Direct supervision/assist for financial management;Assist for transportation;Help with stairs or ramp for entrance   Equipment Recommendations  Other (comment) (defer to next  venue)    Recommendations for Other Services Rehab consult;Speech consult    Precautions / Restrictions Precautions Precautions: Fall Recall of Precautions/Restrictions: Impaired Precaution/Restrictions Comments: SBP < 160, bilat mitts, CIWA Restrictions Weight Bearing Restrictions Per Provider Order: No       Mobility Bed Mobility Overal bed mobility: Needs Assistance Bed Mobility: Supine to Sit, Sit to Supine     Supine to sit: HOB elevated, Mod assist Sit to supine: Mod assist, +2 for physical assistance, +2 for safety/equipment, HOB elevated   General bed mobility comments: trunk and LE management, scooting to EOB, correcting R lateral bias    Transfers Overall transfer level: Needs assistance Equipment used: 2 person hand held assist Transfers: Sit to/from Stand Sit to Stand: Mod assist, +2 physical assistance, From elevated surface     Step pivot transfers: Mod assist, +2 physical assistance     General transfer comment: assist for power up, rise, steady. Repeated stands x3 with improvement each time, step up the bed towards L with facilitation RLE progression and at trunk.     Balance Overall balance assessment: Needs assistance Sitting-balance support: No upper extremity supported, Feet supported Sitting balance-Leahy Scale: Poor Sitting balance - Comments: heavy R lateral bias, requiring max external cues to correct and mod physical assist Postural control: Right lateral lean Standing balance support: Bilateral upper extremity supported, During functional activity, Reliant on assistive device for balance Standing balance-Leahy Scale: Poor Standing balance comment: reliant on external assist                           ADL either performed or assessed with clinical judgement   ADL Overall  ADL's : Needs assistance/impaired     Grooming: Moderate assistance;Sitting;Wash/dry face Grooming Details (indicate cue type and reason): Pt with multimodal  cues was able to use LUE to wash behind R ear and face.             Lower Body Dressing: Maximal assistance;Bed level Lower Body Dressing Details (indicate cue type and reason): will lift legs off the bed to don socks Toilet Transfer: Moderate assistance;+2 for physical assistance;+2 for safety/equipment Toilet Transfer Details (indicate cue type and reason): simulated through sit<>stand at EOB Toileting- Clothing Manipulation and Hygiene: Maximal assistance;Bed level       Functional mobility during ADLs: Moderate assistance;+2 for physical assistance;+2 for safety/equipment;Cueing for sequencing (2 person face to face) General ADL Comments: R inattention, R side deficits, visual deficits?, decreased balance, cognition, activity tolerance    Extremity/Trunk Assessment Upper Extremity Assessment Upper Extremity Assessment: RUE deficits/detail RUE Deficits / Details: Pt attempting to hold items with max multimodal cues RUE Coordination: decreased fine motor;decreased gross motor            Vision   Vision Assessment?: Vision impaired- to be further tested in functional context   Perception Perception Perception: Impaired Preception Impairment Details: Inattention/Neglect (Right) Perception-Other Comments: Today Pt would take items in R hand. no visual attention to R, would not turn head to right. Would pass items from L to right hand to use functionally   Praxis Praxis Praxis: Impaired Praxis Impairment Details: Motor planning;Organization   Communication Communication Communication: Impaired Factors Affecting Communication: Difficulty expressing self;Reduced clarity of speech   Cognition Arousal: Lethargic (improved to alert with positional change) Behavior During Therapy: Flat affect       Awareness: Intellectual awareness impaired, Online awareness impaired   Attention impairment (select first level of impairment): Sustained attention (briefly) Executive  functioning impairment (select all impairments): Organization, Sequencing, Reasoning, Problem solving, Initiation OT - Cognition Comments: multimodal cues for problem solving in light of physical deficits throughout. pt follows one step commands inconsistently                 Following commands: Impaired Following commands impaired: Follows one step commands inconsistently, Follows one step commands with increased time      Cueing   Cueing Techniques: Verbal cues, Tactile cues, Visual cues, Gestural cues  Exercises      Shoulder Instructions       General Comments HR initially aounr 100 upon entering the room. With seated activity and sit<>stand Pt HR up to 130, returned supine, HR returned to around 100    Pertinent Vitals/ Pain       Pain Assessment Pain Assessment: Faces Faces Pain Scale: No hurt Pain Intervention(s): Monitored during session  Home Living                                          Prior Functioning/Environment              Frequency  Min 2X/week        Progress Toward Goals  OT Goals(current goals can now be found in the care plan section)  Progress towards OT goals: Progressing toward goals  Acute Rehab OT Goals OT Goal Formulation: With patient Time For Goal Achievement: 03/07/24 Potential to Achieve Goals: Good  Plan      Co-evaluation    PT/OT/SLP Co-Evaluation/Treatment: Yes Reason for Co-Treatment: Necessary to address cognition/behavior during  functional activity;To address functional/ADL transfers   OT goals addressed during session: ADL's and self-care;Strengthening/ROM SLP goals addressed during session: Swallowing;Cognition;Communication    AM-PAC OT 6 Clicks Daily Activity     Outcome Measure   Help from another person eating meals?: A Lot Help from another person taking care of personal grooming?: A Lot Help from another person toileting, which includes using toliet, bedpan, or urinal?:  Total Help from another person bathing (including washing, rinsing, drying)?: A Lot Help from another person to put on and taking off regular upper body clothing?: A Lot Help from another person to put on and taking off regular lower body clothing?: Total 6 Click Score: 10    End of Session Equipment Utilized During Treatment: Gait belt  OT Visit Diagnosis: Unsteadiness on feet (R26.81);Muscle weakness (generalized) (M62.81);Other symptoms and signs involving cognitive function;Low vision, both eyes (H54.2);Apraxia (R48.2);Hemiplegia and hemiparesis Hemiplegia - Right/Left: Left Hemiplegia - dominant/non-dominant: Non-Dominant   Activity Tolerance Patient tolerated treatment well   Patient Left in bed;with call bell/phone within reach;with bed alarm set   Nurse Communication Mobility status        Time: 8893-8868 OT Time Calculation (min): 25 min  Charges: OT General Charges $OT Visit: 1 Visit OT Treatments $Self Care/Home Management : 8-22 mins  Leita DEL OTR/L Acute Rehabilitation Services Office: [phone]  Leita PARAS [facility] 02/25/2024, 12:47 PM

## 2024-02-26 DIAGNOSIS — I639 Cerebral infarction, unspecified: Secondary | ICD-10-CM | POA: Diagnosis not present

## 2024-02-26 DIAGNOSIS — R233 Spontaneous ecchymoses: Secondary | ICD-10-CM | POA: Diagnosis not present

## 2024-02-26 DIAGNOSIS — I779 Disorder of arteries and arterioles, unspecified: Secondary | ICD-10-CM | POA: Diagnosis not present

## 2024-02-26 DIAGNOSIS — R29718 NIHSS score 18: Secondary | ICD-10-CM | POA: Diagnosis not present

## 2024-02-26 DIAGNOSIS — I63511 Cerebral infarction due to unspecified occlusion or stenosis of right middle cerebral artery: Secondary | ICD-10-CM | POA: Diagnosis not present

## 2024-02-26 LAB — CBC
HCT: 42.6 % (ref 39.0–52.0)
Hemoglobin: 14.2 g/dL (ref 13.0–17.0)
MCH: 31.8 pg (ref 26.0–34.0)
MCHC: 33.3 g/dL (ref 30.0–36.0)
MCV: 95.3 fL (ref 80.0–100.0)
Platelets: 281 K/uL (ref 150–400)
RBC: 4.47 MIL/uL (ref 4.22–5.81)
RDW: 14.2 % (ref 11.5–15.5)
WBC: 6.2 K/uL (ref 4.0–10.5)
nRBC: 0 % (ref 0.0–0.2)

## 2024-02-26 LAB — MAGNESIUM: Magnesium: 2.1 mg/dL (ref 1.7–2.4)

## 2024-02-26 LAB — BASIC METABOLIC PANEL WITH GFR
Anion gap: 13 (ref 5–15)
BUN: 9 mg/dL (ref 8–23)
CO2: 22 mmol/L (ref 22–32)
Calcium: 8.8 mg/dL — ABNORMAL LOW (ref 8.9–10.3)
Chloride: 104 mmol/L (ref 98–111)
Creatinine, Ser: 1.15 mg/dL (ref 0.61–1.24)
GFR, Estimated: >60 mL/min (ref >60)
Glucose, Bld: 94 mg/dL (ref 70–99)
Potassium: 3.5 mmol/L (ref 3.5–5.1)
Sodium: 139 mmol/L (ref 135–145)

## 2024-02-26 LAB — PHOSPHORUS: Phosphorus: 4.8 mg/dL — ABNORMAL HIGH (ref 2.5–4.6)

## 2024-02-26 NOTE — Progress Notes (Signed)
 PROGRESS NOTE  LEILAN BOCHENEK FMW:[id number] DOB: 30-Dec-1960 DOA: 02/21/2024 PCP: Leonarda Roxan BROCKS, NP   LOS: 5 days   Brief Narrative / Interim history: Frank Fischer is a 63 y.o. 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  Subsequently treated with TNK and thrombectomy was transition to progressive care.  Today, patient is noted to have increasing agitation and restlessness, tachycardia, hypertension, giving concerns for possible alcohol withdrawal.  Patient's history of drinking at least 6 beers a day along with other polysubstance use including cocaine, marijuana, tobacco.   Subjective / 24h Interval events: Remains intermittently agitated, alternating with periods of sleepiness  Assesement and Plan: Principal problem Acute alcohol withdrawal - History of alcohol dependence drinking at least 6 beers a day.  Noted ethanol 130 on presentation 10/4.  Hospitalist team consulted 10/7 due to increased restlessness/agitation, bradycardia with concern for alcohol withdrawal.  Has been withdrawing significantly, more so 10/8, started on phenobarbital taper - Continue to closely monitor   Active problems Acute ischemic CVA left MCA - S/p mechanical thrombectomy and TNK left carotid terminus and M1 occlusion.  Underwent full stroke workup with 2D echocardiogram LVEF 65 to 65%, LDL 111, A1c 6.1, currently on aspirin  81 mg.  CIR is recommended, awaiting bed  Hypertension - Continues on Norvasc  10 mg daily plus lisinopril  40 mg daily.  Blood pressures are stable   Hyperlipidemia -  High-dose atorvastatin  and Zetia  on board.   Tobacco abuse -continue nicotine patch  Hypokalemia-continue to replenish potassium.  Magnesium normal  Polysubstance abuse - Noted tox screen on  presentation positive for cocaine and marijuana.    Poststroke dysphagia - Evaluated by SLP.  Patient declining solids but able to take large sips of sips of liquids without signs of aspiration.  Continues on mechanical soft (dysphagia 3) with thin liquids.   BPH -continue Flomax   Scheduled Meds:  amLODipine   10 mg Oral Daily   aspirin   81 mg Oral Daily   atorvastatin   80 mg Oral Daily   buPROPion  ER  200 mg Oral BID WC   Chlorhexidine Gluconate Cloth  6 each Topical Q0600   enoxaparin  (LOVENOX ) injection  40 mg Subcutaneous Daily   ezetimibe   10 mg Oral Daily   feeding supplement  237 mL Oral BID BM   folic acid   1 mg Oral Daily   lisinopril   40 mg Oral Daily   multivitamin with minerals  1 tablet Oral Daily   nicotine  7 mg Transdermal Daily   phenobarbital  97.2 mg Oral Q8H   Followed by   NOREEN ON 02/27/2024] phenobarbital  64.8 mg Oral Q8H   Followed by   NOREEN ON 02/29/2024] phenobarbital  32.4 mg Oral Q8H   tamsulosin   0.4 mg Oral Daily   thiamine   100 mg Oral Daily   Or   thiamine   100 mg Intravenous Daily   Continuous Infusions: PRN Meds:.acetaminophen  **OR** acetaminophen  (TYLENOL ) oral liquid 160 mg/5 mL **OR** acetaminophen , albuterol , labetalol  **AND** [DISCONTINUED] clevidipine, LORazepam , senna-docusate  Current Outpatient Medications  Medication Instructions   albuterol  (VENTOLIN  HFA) 108 (90 Base) MCG/ACT inhaler 2 puffs, Inhalation, Every 6 hours PRN   amLODipine  (NORVASC ) 10 mg, Oral, Daily   aspirin  EC 81 mg, Daily   atorvastatin  (LIPITOR ) 80 mg, Oral, Daily   buPROPion  (WELLBUTRIN  SR) 200 mg,  Oral, 2 times daily   ezetimibe  (ZETIA ) 10 mg, Oral, Daily   lisinopril  (ZESTRIL ) 40 mg, Oral, Every morning   naproxen  (NAPROSYN ) 375 mg, Oral, 2 times daily with meals   tamsulosin  (FLOMAX ) 0.4 mg, Oral, Daily   triamcinolone  cream (KENALOG ) 0.1 % 1 Application, Topical, 2 times daily    Diet Orders (From admission, onward)     Start     Ordered    02/22/24 1044  DIET DYS 3 Room service appropriate? Yes with Assist; Fluid consistency: Thin  Diet effective now       Comments: Meds whole in puree  Question Answer Comment  Room service appropriate? Yes with Assist   Fluid consistency: Thin      02/22/24 1043            DVT prophylaxis: enoxaparin  (LOVENOX ) injection 40 mg Start: 02/22/24 1515 SCD's Start: 02/21/24 0045   Lab Results  Component Value Date   PLT 281 02/26/2024      Code Status: Full Code  Family Communication: No family at bedside  Status is: Inpatient Remains inpatient appropriate because: Severity of illness   Level of care: Progressive  Objective: Vitals:   02/26/24 0001 02/26/24 0045 02/26/24 0412 02/26/24 0800  BP: (!) 153/91 (!) 153/91 137/81 120/70  Pulse: 90  (!) 56 78  Resp: (!) 26  16 16   Temp: 98.3 F (36.8 C)  97.8 F (36.6 C) 99 F (37.2 C)  TempSrc: Oral  Axillary Oral  SpO2: 100%  100% 99%  Weight:        Intake/Output Summary (Last 24 hours) at 02/26/2024 1044 Last data filed at 02/26/2024 0600 Gross per 24 hour  Intake 50 ml  Output 600 ml  Net -550 ml   Wt Readings from Last 3 Encounters:  02/21/24 93 kg  01/10/24 95.3 kg  07/22/23 95.3 kg    Examination:  Constitutional: NAD Eyes: lids and conjunctivae normal, no scleral icterus ENMT: mmm Neck: normal, supple Respiratory: clear to auscultation bilaterally, no wheezing, no crackles.  Cardiovascular: Regular rate and rhythm, no murmurs / rubs / gallops. No LE edema. Abdomen: soft, no distention, no tenderness. Bowel sounds positive.    Data Reviewed: I have independently reviewed following labs and imaging studies   CBC Recent Labs  Lab 02/21/24 0035 02/21/24 0036 02/21/24 0834 02/22/24 0510 02/23/24 0646 02/26/24 0425  WBC 5.4  --  7.4 7.6 5.9 6.2  HGB 15.4 15.6 14.4 13.7 13.6 14.2  HCT 46.1 46.0 42.2 40.7 39.7 42.6  PLT 258  --  271 245 199 281  MCV 95.4  --  94.0 95.1 95.7 95.3  MCH 31.9  --   32.1 32.0 32.8 31.8  MCHC 33.4  --  34.1 33.7 34.3 33.3  RDW 14.0  --  14.4 14.5 14.1 14.2  LYMPHSABS 1.1  --  1.4 2.1 1.1  --   MONOABS 0.3  --  0.6 0.9 0.6  --   EOSABS 0.0  --  0.0 0.0 0.0  --   BASOSABS 0.0  --  0.0 0.0 0.0  --     Recent Labs  Lab 02/21/24 0035 02/21/24 0036 02/21/24 0834 02/21/24 0848 02/22/24 0510 02/23/24 0646 02/26/24 0425  NA 138 141 141  --  139 136 139  K 3.8 3.8 3.9  --  3.8 3.4* 3.5  CL 105 106 108  --  109 104 104  CO2 18*  --  21*  --  23 21* 22  GLUCOSE  114* 115* 95  --  96 99 94  BUN 8 9 6*  --  7* 7* 9  CREATININE 0.96 1.10 0.95  --  1.03 1.04 1.15  CALCIUM  8.8*  --  8.5*  --  8.2* 8.3* 8.8*  AST 20  --   --   --   --   --   --   ALT 23  --   --   --   --   --   --   ALKPHOS 89  --   --   --   --   --   --   BILITOT 0.2  --   --   --   --   --   --   ALBUMIN 4.0  --   --   --   --   --   --   MG  --   --   --   --   --   --  2.1  INR 1.0  --   --   --   --   --   --   HGBA1C  --   --   --  5.7*  --   --   --     ------------------------------------------------------------------------------------------------------------------ No results for input(s): CHOL, HDL, LDLCALC, TRIG, CHOLHDL, LDLDIRECT in the last 72 hours.  Lab Results  Component Value Date   HGBA1C 5.7 (H) 02/21/2024   ------------------------------------------------------------------------------------------------------------------ No results for input(s): TSH, T4TOTAL, T3FREE, THYROIDAB in the last 72 hours.  Invalid input(s): FREET3  Cardiac Enzymes No results for input(s): CKMB, TROPONINI, MYOGLOBIN in the last 168 hours.  Invalid input(s): CK ------------------------------------------------------------------------------------------------------------------ No results found for: BNP  CBG: Recent Labs  Lab 02/21/24 0032 02/22/24 1548 02/22/24 2016  GLUCAP 117* 111* 113*    Recent Results (from the past 240 hours)  Resp panel  by RT-PCR (RSV, Flu A&B, Covid) Anterior Nasal Swab     Status: None   Collection Time: 02/21/24  1:11 AM   Specimen: Anterior Nasal Swab  Result Value Ref Range Status   SARS Coronavirus 2 by RT PCR NEGATIVE NEGATIVE Final   Influenza A by PCR NEGATIVE NEGATIVE Final   Influenza B by PCR NEGATIVE NEGATIVE Final    Comment: (NOTE) The Xpert Xpress SARS-CoV-2/FLU/RSV plus assay is intended as an aid in the diagnosis of influenza from Nasopharyngeal swab specimens and should not be used as a sole basis for treatment. Nasal washings and aspirates are unacceptable for Xpert Xpress SARS-CoV-2/FLU/RSV testing.  Fact Sheet for Patients: BloggerCourse.com  Fact Sheet for Healthcare Providers: SeriousBroker.it  This test is not yet approved or cleared by the [place]  FDA and has been authorized for detection and/or diagnosis of SARS-CoV-2 by FDA under an Emergency Use Authorization (EUA). This EUA will remain in effect (meaning this test can be used) for the duration of the COVID-19 declaration under Section 564(b)(1) of the Act, 21 U.S.C. section 360bbb-3(b)(1), unless the authorization is terminated or revoked.     Resp Syncytial Virus by PCR NEGATIVE NEGATIVE Final    Comment: (NOTE) Fact Sheet for Patients: BloggerCourse.com  Fact Sheet for Healthcare Providers: SeriousBroker.it  This test is not yet approved or cleared by the [place]  FDA and has been authorized for detection and/or diagnosis of SARS-CoV-2 by FDA under an Emergency Use Authorization (EUA). This EUA will remain in effect (meaning this test can be used) for the duration of the COVID-19 declaration under Section 564(b)(1) of the Act, 21 U.S.C.  section 360bbb-3(b)(1), unless the authorization is terminated or revoked.  Performed at [facility], [address], [place], [place] [address]   MRSA  Next Gen by PCR, Nasal     Status: None   Collection Time: 02/21/24  6:14 AM   Specimen: Nasal Swab  Result Value Ref Range Status   MRSA by PCR Next Gen NOT DETECTED NOT DETECTED Final    Comment: (NOTE) The GeneXpert MRSA Assay (FDA approved for NASAL specimens only), is one component of a comprehensive MRSA colonization surveillance program. It is not intended to diagnose MRSA infection nor to guide or monitor treatment for MRSA infections. Test performance is not FDA approved in patients less than 30 years old. Performed at [facility], [address], [place], [place] [address]      Radiology Studies: No results found.   Nilda Fendt, MD, PhD [facility]  Between 7 am - 7 pm I am available, please contact me via Amion (for emergencies) or Securechat (non urgent messages)  Between 7 pm - 7 am I am not available, please contact night coverage MD/APP via Amion

## 2024-02-26 NOTE — Progress Notes (Signed)
 Inpatient Rehab Admissions Coordinator:   I have no beds available for this patient to admit to CIR today.  Will continue to follow for timing of potential admission pending bed availability.   Reche Lowers, PT, DPT Admissions Coordinator [phone] 02/26/24  9:47 AM

## 2024-02-26 NOTE — Progress Notes (Addendum)
 STROKE TEAM PROGRESS NOTE   SIGNIFICANT HOSPITAL EVENTS 10/3 presented as code stroke for left gaze and right hemiplegia received IV TNK and underwent left carotid terminus and M1 occlusion with TICI 3.  Admitted to the ICU   INTERIM HISTORY/SUBJECTIVE No family at the bedside.  Patient slightly restless today but however is following commands less hospitalist to assume care tomorrow Otherwise no new neurological events overnight   CBC    Component Value Date/Time   WBC 6.2 02/26/2024 0425   RBC 4.47 02/26/2024 0425   HGB 14.2 02/26/2024 0425   HGB 15.7 10/17/2016 1618   HCT 42.6 02/26/2024 0425   HCT 46.9 10/17/2016 1618   PLT 281 02/26/2024 0425   PLT 241 10/17/2016 1618   MCV 95.3 02/26/2024 0425   MCV 95 10/17/2016 1618   MCH 31.8 02/26/2024 0425   MCHC 33.3 02/26/2024 0425   RDW 14.2 02/26/2024 0425   RDW 15.6 (H) 10/17/2016 1618   LYMPHSABS 1.1 02/23/2024 0646   MONOABS 0.6 02/23/2024 0646   EOSABS 0.0 02/23/2024 0646   BASOSABS 0.0 02/23/2024 0646    BMET    Component Value Date/Time   NA 139 02/26/2024 0425   NA 144 10/17/2016 1618   K 3.5 02/26/2024 0425   CL 104 02/26/2024 0425   CO2 22 02/26/2024 0425   GLUCOSE 94 02/26/2024 0425   BUN 9 02/26/2024 0425   BUN 14 10/17/2016 1618   CREATININE 1.15 02/26/2024 0425   CREATININE 1.16 07/22/2023 1434   CALCIUM  8.8 (L) 02/26/2024 0425   EGFR 70 07/22/2023 1434   GFRNONAA >60 02/26/2024 0425   GFRNONAA 88 06/21/2013 1559    IMAGING past 24 hours No results found.  Vitals:   02/26/24 0045 02/26/24 0412 02/26/24 0800 02/26/24 1051  BP: (!) 153/91 137/81 120/70 138/84  Pulse:  (!) 56 78 83  Resp:  16 16 (!) 21  Temp:  97.8 F (36.6 C) 99 F (37.2 C) 97.8 F (36.6 C)  TempSrc:  Axillary Oral Oral  SpO2:  100% 99% 99%  Weight:         PHYSICAL EXAM General: Critically ill African-American male Psych:  Mood and affect appropriate for situation CV: Regular rate and rhythm on monitor Respiratory:   Regular, unlabored respirations on room air GI: Abdomen soft and nontender     NEURO:  Mental Status: lethargic, arouses he is globally aphasic-dysarthric.  Able to follow some commands with cues slightly delayed   Cranial Nerves:  II: PERRL. Visual fields no blink to threat on right III, IV, VI: Left gaze preference can cross midline V: Sensation is intact to light touch and symmetrical to face.  VII: Face is asymmetric slight facial droop VIII: hearing intact to voice. IX, X: Palate elevates symmetrically. Phonation is normal.  KP:Dynloizm shrug 5/5. XII: tongue is midline without fasciculations. Motor: He is purposeful on the left, and antigravity with left arm and leg, right arm is antigravity, right leg with no movement. Tone: is normal and bulk is normal Sensation-decreased on right Coordination: Unable to assess Gait- deferred   Most Recent NIH18     ASSESSMENT/PLAN   Mr. NOMAR BROAD is a 63 y.o. male with history of multiple prior strokes (R sided, with residual mild L hemiplegia), prostate cancer (s/p brachytherapy, 2018), polysubstance use (marijuana, crack, tobacco, alcohol), HTN, depression, BPH who presents as a code stroke for left gaze & right hemiplegia.  NIH on Admission 25   Acute Ischemic Infarct:  left  MCA s/p mechanical thrombectomy and TNK of left carotid terminus and M1 occlusion with TICI 3 Etiology: Large vessel disease, uncontrolled risk factors Code Stroke  CT head  Acute/early subacute left MCA territory infarct with focal loss of gray-white differentiation (ASPECTS 8) and hyperdense left MCA. Chronic infarcts in the right MCA and PCA territories  CTA head & neck large vessel occlusion of the left MCA  MRI large subacute left MCA infarct with small petechial hemorrhage MRA  left ICA occlusion 2D Echo EF 60 to 65%. LDL 111 HgbA1c 6.1 UDS positive for cocaine and THC VTE prophylaxis -SCDs No antithrombotic prior to admission, now on aspirin  81 mg  daily alone given large size of stroke Therapy recommendations:  CIR Disposition: Pending   Hx of Stroke/TIA Chronic infarcts in the right MCA and PCA territories   Hypertension Home meds: Amlodipine  10 mg, lisinopril  40 mg-appears patient is noncompliant and was not taking medication Stable Norvasc  and lisinopril  restarted Blood Pressure Goal: SBP 120-160 for first 24 hours then less than 180    Hyperlipidemia Home meds: Zetia  10 mg and atorvastatin  80 mg,  resumed in hospital-patient noted to be noncompliant with medication LDL 111, goal < 70 Continue statin at discharge     COPD Albuterol  as needed Maintain sats 88 to 92%   Tobacco Abuse Patient smokes cigarettes daily Assess willingness to quit when able Nicotine replacement therapy provided   Substance Abuse EtOH abuse Patient uses THC, cocaine Drinks 6 beers a day UDS ordered CIWA protocol Thiamine , folate, multivitamin ETOH use, alcohol level 130, advised to drink no more than 2 drink(s) a day Assess willingness to quit when able [facility] consult for cessation placed   Dysphagia Patient has post-stroke dysphagia, SLP consulted  Other Stroke Risk Factors Chronic headaches History of prostate cancer     Other Active Problems Depression-Home bupropion  200 mg twice daily BPH-Flomax     Hospital day # 5  [name] DNP, ACNPC-AG  [facility]  I have personally obtained history,examined this patient, reviewed notes, independently viewed imaging studies, participated in medical decision making and plan of care.ROS completed by me personally and pertinent positives fully documented  I have made any additions or clarifications directly to the above note. Agree with note above.  Patient remains intermittently agitated and is on CIWA protocol and now phenobarbital taper has been ordered.  Discussed with Dr. Nilda.  Patient will be transferred to medical hospitalist team and stroke team will be available  for follow-up as needed.  Stroke team will sign off.  Kindly call for questions   I personally spent a total of 35 minutes in the care of the patient today including getting/reviewing separately obtained history, performing a medically appropriate exam/evaluation, counseling and educating, placing orders, referring and communicating with other health care professionals, documenting clinical information in the EHR, independently interpreting results, and coordinating care.         Eather Popp, MD Medical Director [facility] Pager: [phone] 02/26/2024 4:23 PM   To contact Stroke Continuity provider, please refer to [email or web address]. After hours, contact General Neurology

## 2024-02-26 NOTE — PMR Pre-admission (Signed)
 PMR Admission Coordinator Pre-Admission Assessment  Patient: Frank Fischer is an 63 y.o., male MRN: [id number] DOB: October 22, 1960 Height:   Weight: 93 kg  Insurance Information HMO:     PPO:      PCP:      IPA:      80/20:      OTHER:  PRIMARY: Medicare Part A and B      Policy#: [id number]       Subscriber: pt CM Name:       Phone#:      Fax#:  Pre-Cert#: verified Health and safety inspector:  Benefits:  Phone #:      Name:  Eff. Date: A/B 02/18/24     Deduct: $1676      Out of Pocket Max: n/a      Life Max: n/a CIR: 100%      SNF: 20 full days Outpatient: 80%     Co-Pay: 20% Home Health: 100%      Co-Pay:  DME: 80%     Co-Pay: 20% Providers:  SECONDARY: Medicaid of [place]      Policy#: [id number]     Phone#: [phone]  Financial Counselor:       Phone#:   The "Data Collection Information Summary" for patients in Inpatient Rehabilitation Facilities with attached "Privacy Act Statement-Health Care Records" was provided and verbally reviewed with: Patient and Family  Emergency Contact Information Contact Information     Name Relation Home Work Mobile   [place] Brother [phone]     [facility]   [phone]      Other Contacts   None on File     Current Medical History  Patient Admitting Diagnosis: CVA   History of Present Illness: Pt is a 5 male with PMH of multiple prior strokes, prostate cancer, PSA, HTN, depression, BPH who was admitted on 10/4 to [facility] with L gaze preference and R hemiparesis.  In ED NIHSS 25.  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  He was given TNK and underwent mechanical thrombectomy which was successful.  MRI confirmed large subacute L MCA infarct with small petechial hemorrahge.  A1C 6.1, UDS positive for THC and cocaine.  He was recommended for aspirin  alone given large size of stroke.  Therapy ongoing and pt was recommended for CIR.   Complete NIHSS TOTAL: 9  Patient's medical record from [name] has been reviewed by the  rehabilitation admission coordinator and physician.  Past Medical History  Past Medical History:  Diagnosis Date   Arthritis    Cervical stenosis of spine    Chronic headaches    Dental caries    periodontal disease   Frequency of urination    History of cerebral infarction 11-28-2017  per pt never had symptoms   per MRI imaging 10-31-2016 2 small chronic lacunar infartions in the right frontal periventricular white matter , and chronic left medial orbital fracture (as seen 2014 MRI)   Hypertension    Noncompliance with medication regimen    Prostate cancer [facility]) urologist-  dr winter/  oncologist-  dr patrcia   dx 07-21-2017--- Stage T1c, Gleason 4+3,  PSA 13.40,  vol 49.7cc--  scheduled for radioactive seed implants 12-03-2017    Has the patient had major surgery during 100 days prior to admission? Yes  Family History   family history includes Diabetes in his paternal grandfather; Hypertension in his brother; Lung cancer in his mother; Prostate cancer in his cousin.  Current Medications  Current  Facility-Administered Medications:    acetaminophen  (TYLENOL ) tablet 650 mg, 650 mg, Oral, Q4H PRN **OR** acetaminophen  (TYLENOL ) 160 MG/5ML solution 650 mg, 650 mg, Per Tube, Q4H PRN **OR** acetaminophen  (TYLENOL ) suppository 650 mg, 650 mg, Rectal, Q4H PRN, Sallyann, Xem M, MD   albuterol  (PROVENTIL ) (2.5 MG/3ML) 0.083% nebulizer solution 2.5 mg, 2.5 mg, Nebulization, Q4H PRN, Bui, Xem M, MD   amLODipine  (NORVASC ) tablet 10 mg, 10 mg, Oral, Daily, Bui, Xem M, MD, 10 mg at 02/27/24 9183   aspirin  chewable tablet 81 mg, 81 mg, Oral, Daily, Waddell Aquas A, NP, 81 mg at 02/27/24 0817   atorvastatin  (LIPITOR ) tablet 80 mg, 80 mg, Oral, Daily, Waddell Aquas A, NP, 80 mg at 02/27/24 0816   buPROPion  ER (WELLBUTRIN  SR) 12 hr tablet 200 mg, 200 mg, Oral, BID WC, Bui, Xem M, MD, 200 mg at 02/27/24 9182   Chlorhexidine Gluconate Cloth 2 % PADS 6 each, 6 each, Topical, Q0600, Bui, Xem M, MD, 6 each at  02/27/24 0557   enoxaparin  (LOVENOX ) injection 40 mg, 40 mg, Subcutaneous, Daily, Waddell Aquas A, NP, 40 mg at 02/27/24 0817   ezetimibe  (ZETIA ) tablet 10 mg, 10 mg, Oral, Daily, Bui, Xem M, MD, 10 mg at 02/27/24 0816   feeding supplement (ENSURE PLUS HIGH PROTEIN) liquid 237 mL, 237 mL, Oral, BID BM, Sethi, Pramod S, MD, 237 mL at 02/27/24 9182   folic acid  (FOLVITE ) tablet 1 mg, 1 mg, Oral, Daily, Bui, Xem M, MD, 1 mg at 02/27/24 9183   labetalol  (NORMODYNE ) injection 20 mg, 20 mg, Intravenous, Once PRN **AND** [DISCONTINUED] clevidipine (CLEVIPREX) infusion 0.5 mg/mL, 0-21 mg/hr, Intravenous, Continuous, Bui, Xem M, MD   lisinopril  (ZESTRIL ) tablet 40 mg, 40 mg, Oral, Daily, Bui, Xem M, MD, 40 mg at 02/27/24 0816   LORazepam  (ATIVAN ) injection 1 mg, 1 mg, Intravenous, Q4H PRN, Gherghe, Costin M, MD, 1 mg at 02/26/24 1310   multivitamin with minerals tablet 1 tablet, 1 tablet, Oral, Daily, Bui, Xem M, MD, 1 tablet at 02/27/24 0817   nicotine (NICODERM CQ - dosed in mg/24 hr) patch 7 mg, 7 mg, Transdermal, Daily, Bui, Xem M, MD, 7 mg at 02/27/24 0817   [COMPLETED] PHENobarbital (LUMINAL) tablet 97.2 mg, 97.2 mg, Oral, Q8H, 97.2 mg at 02/27/24 0416 **FOLLOWED BY** PHENobarbital (LUMINAL) tablet 64.8 mg, 64.8 mg, Oral, Q8H **FOLLOWED BY** [START ON 02/29/2024] PHENobarbital (LUMINAL) tablet 32.4 mg, 32.4 mg, Oral, Q8H, Gherghe, Costin M, MD   senna-docusate (Senokot-S) tablet 1 tablet, 1 tablet, Oral, QHS PRN, Bui, Xem M, MD   tamsulosin  (FLOMAX ) capsule 0.4 mg, 0.4 mg, Oral, Daily, Bui, Xem M, MD, 0.4 mg at 02/27/24 9182   thiamine  (VITAMIN B1) tablet 100 mg, 100 mg, Oral, Daily, 100 mg at 02/27/24 0816 **OR** thiamine  (VITAMIN B1) injection 100 mg, 100 mg, Intravenous, Daily, Trudy Harlene SAILOR, NP  Patients Current Diet:  Diet Order             DIET DYS 3 Room service appropriate? Yes with Assist; Fluid consistency: Thin  Diet effective now                   Precautions /  Restrictions Precautions Precautions: Fall Precaution/Restrictions Comments: SBP < 160, bilat mitts, CIWA Restrictions Weight Bearing Restrictions Per Provider Order: No   Has the patient had 2 or more falls or a fall with injury in the past year? Yes  Prior Activity Level Limited Community (1-2x/wk): independent prior to admit no driving  Prior Functional  Level Self Care: Did the patient need help bathing, dressing, using the toilet or eating? Independent  Indoor Mobility: Did the patient need assistance with walking from room to room (with or without device)? Independent  Stairs: Did the patient need assistance with internal or external stairs (with or without device)? Independent  Functional Cognition: Did the patient need help planning regular tasks such as shopping or remembering to take medications? Independent  Patient Information Are you of Hispanic, Latino/a,or Spanish origin?: A. No, not of Hispanic, Latino/a, or Spanish origin, X. Patient unable to respond What is your race?: B. Black or African American, X. Patient unable to respond Do you need or want an interpreter to communicate with a doctor or health care staff?: 0. No Patient information obtained via proxy : per wife  Patient's Response To:  Health Literacy and Transportation Is the patient able to respond to health literacy and transportation needs?: No Health Literacy - How often do you need to have someone help you when you read instructions, pamphlets, or other written material from your doctor or pharmacy?: Patient unable to respond In the past 12 months, has lack of transportation kept you from medical appointments or from getting medications?: No In the past 12 months, has lack of transportation kept you from meetings, work, or from getting things needed for daily living?: No Health Literacy and Transportation obtained via proxy: per wife  Journalist, newspaper / Equipment Home Equipment:  (pt with  difficulty reporting)  Prior Device Use: Indicate devices/aids used by the patient prior to current illness, exacerbation or injury? None of the above  Current Functional Level Cognition  Arousal/Alertness: Awake/alert Overall Cognitive Status: Impaired/Different from baseline Orientation Level: Oriented to person Attention: Sustained Sustained Attention: Impaired Sustained Attention Impairment: Functional basic Awareness: Impaired Awareness Impairment: Intellectual impairment Problem Solving: Impaired Problem Solving Impairment: Functional basic    Extremity Assessment (includes Sensation/Coordination)  Upper Extremity Assessment: RUE deficits/detail RUE Deficits / Details: Pt attempting to hold items with max multimodal cues RUE Coordination: decreased fine motor, decreased gross motor LUE Deficits / Details: unsure if affected by prior CVA, but slowed initiation on L. Withdrew to noxious stimuli.  Lower Extremity Assessment: Defer to PT evaluation RLE Deficits / Details: withdrew to noxious stimuli but with delay; grossly </= 3- strength; incoordination noted    ADLs  Overall ADL's : Needs assistance/impaired Grooming: Moderate assistance, Sitting, Wash/dry face Grooming Details (indicate cue type and reason): Pt with multimodal cues was able to use LUE to wash behind R ear and face. Upper Body Bathing: Moderate assistance Lower Body Bathing: +2 for physical assistance, +2 for safety/equipment, Maximal assistance Upper Body Dressing : Moderate assistance Lower Body Dressing: Maximal assistance, Bed level Lower Body Dressing Details (indicate cue type and reason): will lift legs off the bed to don socks Toilet Transfer: Moderate assistance, +2 for physical assistance, +2 for safety/equipment Toilet Transfer Details (indicate cue type and reason): simulated through sit<>stand at EOB Toileting- Clothing Manipulation and Hygiene: Maximal assistance, Bed level Functional mobility  during ADLs: Moderate assistance, +2 for physical assistance, +2 for safety/equipment, Cueing for sequencing (2 person face to face) General ADL Comments: R inattention, R side deficits, visual deficits?, decreased balance, cognition, activity tolerance    Mobility  Overal bed mobility: Needs Assistance Bed Mobility: Supine to Sit, Sit to Supine Supine to sit: HOB elevated, Mod assist Sit to supine: Mod assist, +2 for physical assistance, +2 for safety/equipment, HOB elevated General bed mobility comments: trunk and LE management,  scooting to EOB, correcting R lateral bias    Transfers  Overall transfer level: Needs assistance Equipment used: 2 person hand held assist Transfers: Sit to/from Stand Sit to Stand: Mod assist, +2 physical assistance, From elevated surface Bed to/from chair/wheelchair/BSC transfer type:: Step pivot (up the bed) Step pivot transfers: Mod assist, +2 physical assistance General transfer comment: assist for power up, rise, steady. Repeated stands x3 with improvement each time, step up the bed towards L with facilitation RLE progression and at trunk.    Ambulation / Gait / Stairs / Wheelchair Mobility  Ambulation/Gait Ambulation/Gait assistance: Mod assist, +2 physical assistance, +2 safety/equipment Gait Distance (Feet): 3 Feet Assistive device: 2 person hand held assist Gait Pattern/deviations: Step-to pattern, Decreased step length - right, Decreased step length - left, Decreased stride length, Decreased weight shift to right, Decreased weight shift to left General Gait Details: focused on transfers and pregait Gait velocity: decr Gait velocity interpretation: <1.31 ft/sec, indicative of household ambulator Pre-gait activities: weight shift L and R at hips, RLE marching with PT facilitating, finding upright and midline    Posture / Balance Dynamic Sitting Balance Sitting balance - Comments: heavy R lateral bias, requiring max external cues to correct and mod  physical assist Balance Overall balance assessment: Needs assistance Sitting-balance support: No upper extremity supported, Feet supported Sitting balance-Leahy Scale: Poor Sitting balance - Comments: heavy R lateral bias, requiring max external cues to correct and mod physical assist Postural control: Right lateral lean Standing balance support: Bilateral upper extremity supported, During functional activity, Reliant on assistive device for balance Standing balance-Leahy Scale: Poor Standing balance comment: reliant on external assist    Special considerations/life events  Diabetic management yes   Previous Home Environment (from acute therapy documentation) Living Arrangements: Spouse/significant other Available Help at Discharge: Family Home Layout: One level Home Access: Level entry Bathroom Shower/Tub: Tub/shower unit Additional Comments: may need to confirm at later date due to expressive and receptive deficits, unsure of accuracy  Discharge Living Setting Plans for Discharge Living Setting: Patient's home, Lives with (comment) (spouse) Type of Home at Discharge: Apartment Discharge Home Layout: One level Discharge Home Access: Level entry Discharge Bathroom Shower/Tub: Tub/shower unit Discharge Bathroom Toilet: Standard Discharge Bathroom Accessibility: Yes How Accessible: Accessible via walker Does the patient have any problems obtaining your medications?: No  Social/Family/Support Systems Patient Roles: Spouse Anticipated Caregiver: spouse, [name] Anticipated Caregiver's Contact Information: [phone] Ability/Limitations of Caregiver: none, she is a CNA 3hrs/week Caregiver Availability: 24/7 Discharge Plan Discussed with Primary Caregiver: Yes Is Caregiver In Agreement with Plan?: Yes  Goals Patient/Family Goal for Rehab: PT/OT/SLP supervision to min assist Expected length of stay: 14-16 days Additional Information: Discharge plan: home with spouse who can  provide 24/7 care.  She is a CNA 3 hrs/week and can either take time off or take him with her Pt/Family Agrees to Admission and willing to participate: Yes Program Orientation Provided & Reviewed with Pt/Caregiver Including Roles  & Responsibilities: Yes  Decrease burden of Care through IP rehab admission: n/a  Possible need for SNF placement upon discharge:  Not anticipated.  Plan for discharge home with spouse who is a CNA and can provide physical assist.    Patient Condition: I have reviewed medical records from [facility], spoken with [facility] team, and patient and spouse. I met with patient at the bedside and discussed via phone for inpatient rehabilitation assessment.  Patient will benefit from ongoing PT, OT, and SLP, can actively participate in 3 hours of therapy  a day 5 days of the week, and can make measurable gains during the admission.  Patient will also benefit from the coordinated team approach during an Inpatient Acute Rehabilitation admission.  The patient will receive intensive therapy as well as Rehabilitation physician, nursing, social worker, and care management interventions.  Due to bladder management, bowel management, safety, skin/wound care, disease management, medication administration, pain management, and patient education the patient requires 24 hour a day rehabilitation nursing.  The patient is currently mod assist with mobility and basic ADLs.  Discharge setting and therapy post discharge at home with home health is anticipated.  Patient has agreed to participate in the Acute Inpatient Rehabilitation Program and will admit 02/27/24.  Preadmission Screen Completed By:  Reche FORBES Lowers, PT, DPT 02/27/2024 11:16 AM ______________________________________________________________________   Discussed status with Dr. Babs on 02/27/24  at 11:16 AM and received approval for admission today.  Admission Coordinator:  Caitlin E Warren, PT, DPT time 11:16 AM/Date 02/27/24     Assessment/Plan: Diagnosis: left MCA infarct Does the need for close, 24 hr/day Medical supervision in concert with the patient's rehab needs make it unreasonable for this patient to be served in a less intensive setting? Yes Co-Morbidities requiring supervision/potential complications: prostate cancer, htn, depression Due to bladder management, bowel management, safety, skin/wound care, disease management, medication administration, pain management, and patient education, does the patient require 24 hr/day rehab nursing? Yes Does the patient require coordinated care of a physician, rehab nurse, PT, OT, and SLP to address physical and functional deficits in the context of the above medical diagnosis(es)? Yes Addressing deficits in the following areas: balance, endurance, locomotion, strength, transferring, bowel/bladder control, bathing, dressing, feeding, grooming, toileting, cognition, speech, language, swallowing, and psychosocial support Can the patient actively participate in an intensive therapy program of at least 3 hrs of therapy 5 days a week? Yes The potential for patient to make measurable gains while on inpatient rehab is excellent Anticipated functional outcomes upon discharge from inpatient rehab: supervision and min assist PT, supervision and min assist OT, min assist SLP Estimated rehab length of stay to reach the above functional goals is: 14-16 days Anticipated discharge destination: Home 10. Overall Rehab/Functional Prognosis: excellent   MD Signature: Arthea IVAR Babs, MD, [facility] Physical Medicine & Rehabilitation Medical Director Rehabilitation Services 02/27/2024

## 2024-02-26 NOTE — Progress Notes (Signed)
   02/26/24 1820  Urine Measurement/Characteristics  Urinary Interventions Bladder scan;Post void residual  Bladder Scan Volume (mL) 280 mL (Dr. Trixie notified)   Dr. Trixie notified of pt's post void residual, and verbal order received to In&Out pt. RN attmepted to In&Out but unsuccessful given pt's prostate history. Dr. Trixie notified. Per Dr. Trixie Let's wait another 30 min or so, if he doesn't urinate, perhaps just place a foley. Report given to oncoming shift.

## 2024-02-27 ENCOUNTER — Inpatient Hospital Stay (HOSPITAL_COMMUNITY)
Admission: AD | Admit: 2024-02-27 | Discharge: 2024-02-28 | DRG: 057 | Disposition: A | Discharge status: Home Health | Source: Intra-hospital | Attending: Physical Medicine & Rehabilitation | Admitting: Physical Medicine & Rehabilitation

## 2024-02-27 ENCOUNTER — Encounter (HOSPITAL_COMMUNITY): Payer: Self-pay | Admitting: Physical Medicine & Rehabilitation

## 2024-02-27 ENCOUNTER — Other Ambulatory Visit: Payer: Self-pay

## 2024-02-27 DIAGNOSIS — R1310 Dysphagia, unspecified: Secondary | ICD-10-CM | POA: Diagnosis present

## 2024-02-27 DIAGNOSIS — F10931 Alcohol use, unspecified with withdrawal delirium: Secondary | ICD-10-CM

## 2024-02-27 DIAGNOSIS — I639 Cerebral infarction, unspecified: Principal | ICD-10-CM | POA: Diagnosis present

## 2024-02-27 DIAGNOSIS — R001 Bradycardia, unspecified: Secondary | ICD-10-CM | POA: Diagnosis not present

## 2024-02-27 DIAGNOSIS — M4802 Spinal stenosis, cervical region: Secondary | ICD-10-CM | POA: Diagnosis present

## 2024-02-27 DIAGNOSIS — I69322 Dysarthria following cerebral infarction: Secondary | ICD-10-CM

## 2024-02-27 DIAGNOSIS — E782 Mixed hyperlipidemia: Secondary | ICD-10-CM | POA: Diagnosis present

## 2024-02-27 DIAGNOSIS — I69351 Hemiplegia and hemiparesis following cerebral infarction affecting right dominant side: Secondary | ICD-10-CM | POA: Diagnosis not present

## 2024-02-27 DIAGNOSIS — F1410 Cocaine abuse, uncomplicated: Secondary | ICD-10-CM | POA: Diagnosis present

## 2024-02-27 DIAGNOSIS — C61 Malignant neoplasm of prostate: Secondary | ICD-10-CM | POA: Diagnosis present

## 2024-02-27 DIAGNOSIS — F17200 Nicotine dependence, unspecified, uncomplicated: Secondary | ICD-10-CM | POA: Diagnosis present

## 2024-02-27 DIAGNOSIS — F32A Depression, unspecified: Secondary | ICD-10-CM | POA: Diagnosis present

## 2024-02-27 DIAGNOSIS — E785 Hyperlipidemia, unspecified: Secondary | ICD-10-CM | POA: Diagnosis present

## 2024-02-27 DIAGNOSIS — N400 Enlarged prostate without lower urinary tract symptoms: Secondary | ICD-10-CM | POA: Diagnosis present

## 2024-02-27 DIAGNOSIS — M25519 Pain in unspecified shoulder: Secondary | ICD-10-CM | POA: Diagnosis present

## 2024-02-27 DIAGNOSIS — I63512 Cerebral infarction due to unspecified occlusion or stenosis of left middle cerebral artery: Secondary | ICD-10-CM | POA: Diagnosis not present

## 2024-02-27 DIAGNOSIS — R531 Weakness: Secondary | ICD-10-CM | POA: Diagnosis present

## 2024-02-27 DIAGNOSIS — F09 Unspecified mental disorder due to known physiological condition: Secondary | ICD-10-CM

## 2024-02-27 DIAGNOSIS — Z8249 Family history of ischemic heart disease and other diseases of the circulatory system: Secondary | ICD-10-CM | POA: Diagnosis not present

## 2024-02-27 DIAGNOSIS — Z833 Family history of diabetes mellitus: Secondary | ICD-10-CM | POA: Diagnosis not present

## 2024-02-27 DIAGNOSIS — I69391 Dysphagia following cerebral infarction: Secondary | ICD-10-CM | POA: Diagnosis not present

## 2024-02-27 DIAGNOSIS — E876 Hypokalemia: Secondary | ICD-10-CM | POA: Diagnosis present

## 2024-02-27 DIAGNOSIS — I10 Essential (primary) hypertension: Secondary | ICD-10-CM | POA: Diagnosis present

## 2024-02-27 DIAGNOSIS — I69320 Aphasia following cerebral infarction: Secondary | ICD-10-CM | POA: Diagnosis not present

## 2024-02-27 DIAGNOSIS — I63312 Cerebral infarction due to thrombosis of left middle cerebral artery: Secondary | ICD-10-CM | POA: Diagnosis not present

## 2024-02-27 DIAGNOSIS — F1990 Other psychoactive substance use, unspecified, uncomplicated: Secondary | ICD-10-CM | POA: Diagnosis present

## 2024-02-27 DIAGNOSIS — F17210 Nicotine dependence, cigarettes, uncomplicated: Secondary | ICD-10-CM | POA: Diagnosis present

## 2024-02-27 DIAGNOSIS — I952 Hypotension due to drugs: Secondary | ICD-10-CM | POA: Diagnosis not present

## 2024-02-27 DIAGNOSIS — F1010 Alcohol abuse, uncomplicated: Secondary | ICD-10-CM | POA: Diagnosis not present

## 2024-02-27 DIAGNOSIS — M069 Rheumatoid arthritis, unspecified: Secondary | ICD-10-CM | POA: Diagnosis present

## 2024-02-27 DIAGNOSIS — E559 Vitamin D deficiency, unspecified: Secondary | ICD-10-CM | POA: Diagnosis present

## 2024-02-27 DIAGNOSIS — R414 Neurologic neglect syndrome: Secondary | ICD-10-CM | POA: Diagnosis present

## 2024-02-27 DIAGNOSIS — J449 Chronic obstructive pulmonary disease, unspecified: Secondary | ICD-10-CM | POA: Diagnosis present

## 2024-02-27 DIAGNOSIS — I951 Orthostatic hypotension: Secondary | ICD-10-CM | POA: Diagnosis present

## 2024-02-27 DIAGNOSIS — F10939 Alcohol use, unspecified with withdrawal, unspecified: Secondary | ICD-10-CM | POA: Diagnosis present

## 2024-02-27 DIAGNOSIS — R55 Syncope and collapse: Secondary | ICD-10-CM | POA: Diagnosis not present

## 2024-02-27 MED ORDER — TRAZODONE HCL 50 MG PO TABS: 25.0000 mg | ORAL_TABLET | Freq: Every evening | ORAL | Status: DC | PRN

## 2024-02-27 MED ORDER — SENNOSIDES-DOCUSATE SODIUM 8.6-50 MG PO TABS: 1.0000 | ORAL_TABLET | Freq: Every evening | ORAL | Status: DC | PRN

## 2024-02-27 MED ORDER — ENSURE PLUS HIGH PROTEIN PO LIQD
237.0000 mL | Freq: Two times a day (BID) | ORAL | Status: DC
  Administered 2024-02-28: 237 mL via ORAL

## 2024-02-27 MED ORDER — PHENOBARBITAL 32.4 MG PO TABS
64.8000 mg | ORAL_TABLET | Freq: Three times a day (TID) | ORAL | Status: DC
Start: 2024-02-27 — End: 2024-02-28
  Administered 2024-02-27: 64.8 mg via ORAL
  Filled 2024-02-27: qty 2
  Filled 2024-02-27: qty 1
  Filled 2024-02-27: qty 2

## 2024-02-27 MED ORDER — VITAMIN B-1 100 MG PO TABS: 100.0000 mg | ORAL_TABLET | Freq: Every day | ORAL | Status: DC

## 2024-02-27 MED ORDER — VITAMIN C 500 MG PO TABS
1000.0000 mg | ORAL_TABLET | Freq: Every day | ORAL | Status: DC
  Administered 2024-02-28: 1000 mg via ORAL
  Filled 2024-02-27: qty 2

## 2024-02-27 MED ORDER — THIAMINE HCL 100 MG/ML IJ SOLN
100.0000 mg | Freq: Every day | INTRAMUSCULAR | Status: DC
  Filled 2024-02-27: qty 1

## 2024-02-27 MED ORDER — ATORVASTATIN CALCIUM 80 MG PO TABS
80.0000 mg | ORAL_TABLET | Freq: Every day | ORAL | Status: DC
  Administered 2024-02-28: 80 mg via ORAL
  Filled 2024-02-27: qty 1

## 2024-02-27 MED ORDER — FLEET ENEMA RE ENEM: 1.0000 | ENEMA | Freq: Once | RECTAL | Status: DC | PRN

## 2024-02-27 MED ORDER — GUAIFENESIN-DM 100-10 MG/5ML PO SYRP: 5.0000 mL | ORAL_SOLUTION | Freq: Four times a day (QID) | ORAL | Status: DC | PRN

## 2024-02-27 MED ORDER — ADULT MULTIVITAMIN W/MINERALS CH
1.0000 | ORAL_TABLET | Freq: Every day | ORAL | Status: DC
  Administered 2024-02-28: 1 via ORAL
  Filled 2024-02-27: qty 1

## 2024-02-27 MED ORDER — BUPROPION HCL ER (SR) 100 MG PO TB12
200.0000 mg | ORAL_TABLET | Freq: Two times a day (BID) | ORAL | Status: DC
  Administered 2024-02-28: 200 mg via ORAL
  Filled 2024-02-27 (×2): qty 2

## 2024-02-27 MED ORDER — ALBUTEROL SULFATE (2.5 MG/3ML) 0.083% IN NEBU: 2.5000 mg | INHALATION_SOLUTION | RESPIRATORY_TRACT | Status: DC | PRN

## 2024-02-27 MED ORDER — ZINC SULFATE 220 (50 ZN) MG PO CAPS
220.0000 mg | ORAL_CAPSULE | Freq: Every day | ORAL | Status: DC
  Administered 2024-02-27: 220 mg via ORAL
  Filled 2024-02-27: qty 1

## 2024-02-27 MED ORDER — ACETAMINOPHEN 325 MG PO TABS: 325.0000 mg | ORAL_TABLET | ORAL | Status: DC | PRN

## 2024-02-27 MED ORDER — PROCHLORPERAZINE EDISYLATE 10 MG/2ML IJ SOLN: 5.0000 mg | Freq: Four times a day (QID) | INTRAMUSCULAR | Status: DC | PRN

## 2024-02-27 MED ORDER — EZETIMIBE 10 MG PO TABS
10.0000 mg | ORAL_TABLET | Freq: Every day | ORAL | Status: DC
  Administered 2024-02-28: 10 mg via ORAL
  Filled 2024-02-27: qty 1

## 2024-02-27 MED ORDER — ADULT MULTIVITAMIN W/MINERALS CH: 1.0000 | ORAL_TABLET | Freq: Every day | ORAL | Status: DC

## 2024-02-27 MED ORDER — NICOTINE 7 MG/24HR TD PT24
7.0000 mg | MEDICATED_PATCH | Freq: Every day | TRANSDERMAL | Status: DC
  Administered 2024-02-28: 7 mg via TRANSDERMAL
  Filled 2024-02-27: qty 1

## 2024-02-27 MED ORDER — ENOXAPARIN SODIUM 40 MG/0.4ML IJ SOSY
40.0000 mg | PREFILLED_SYRINGE | Freq: Every day | INTRAMUSCULAR | Status: DC
  Administered 2024-02-28: 40 mg via SUBCUTANEOUS
  Filled 2024-02-27: qty 0.4

## 2024-02-27 MED ORDER — FOLIC ACID 1 MG PO TABS
1.0000 mg | ORAL_TABLET | Freq: Every day | ORAL | Status: DC
  Administered 2024-02-28: 1 mg via ORAL
  Filled 2024-02-27: qty 1

## 2024-02-27 MED ORDER — LISINOPRIL 20 MG PO TABS
40.0000 mg | ORAL_TABLET | Freq: Every day | ORAL | Status: DC
  Administered 2024-02-28: 40 mg via ORAL
  Filled 2024-02-27: qty 2

## 2024-02-27 MED ORDER — THIAMINE MONONITRATE 100 MG PO TABS
100.0000 mg | ORAL_TABLET | Freq: Every day | ORAL | Status: DC
  Administered 2024-02-28: 100 mg via ORAL
  Filled 2024-02-27: qty 1

## 2024-02-27 MED ORDER — DIPHENHYDRAMINE HCL 25 MG PO CAPS: 25.0000 mg | ORAL_CAPSULE | Freq: Four times a day (QID) | ORAL | Status: DC | PRN

## 2024-02-27 MED ORDER — ASPIRIN 81 MG PO CHEW
81.0000 mg | CHEWABLE_TABLET | Freq: Every day | ORAL | Status: DC
  Administered 2024-02-28: 81 mg via ORAL
  Filled 2024-02-27: qty 1

## 2024-02-27 MED ORDER — ENSURE PLUS HIGH PROTEIN PO LIQD: 237.0000 mL | Freq: Two times a day (BID) | ORAL | Status: DC

## 2024-02-27 MED ORDER — ALUM & MAG HYDROXIDE-SIMETH 200-200-20 MG/5ML PO SUSP: 30.0000 mL | ORAL | Status: DC | PRN

## 2024-02-27 MED ORDER — FOLIC ACID 1 MG PO TABS: 1.0000 mg | ORAL_TABLET | Freq: Every day | ORAL | Status: DC

## 2024-02-27 MED ORDER — BISACODYL 10 MG RE SUPP: 10.0000 mg | Freq: Every day | RECTAL | Status: DC | PRN

## 2024-02-27 MED ORDER — PHENOBARBITAL 32.4 MG PO TABS: ORAL_TABLET | ORAL | Status: DC

## 2024-02-27 MED ORDER — TAMSULOSIN HCL 0.4 MG PO CAPS
0.4000 mg | ORAL_CAPSULE | Freq: Every day | ORAL | Status: DC
  Administered 2024-02-28: 0.4 mg via ORAL
  Filled 2024-02-27: qty 1

## 2024-02-27 MED ORDER — PROCHLORPERAZINE 25 MG RE SUPP: 12.5000 mg | Freq: Four times a day (QID) | RECTAL | Status: DC | PRN

## 2024-02-27 MED ORDER — AMLODIPINE BESYLATE 10 MG PO TABS
10.0000 mg | ORAL_TABLET | Freq: Every day | ORAL | Status: DC
  Administered 2024-02-28: 10 mg via ORAL
  Filled 2024-02-27: qty 1

## 2024-02-27 MED ORDER — PHENOBARBITAL 32.4 MG PO TABS: 32.4000 mg | ORAL_TABLET | Freq: Three times a day (TID) | ORAL | Status: DC

## 2024-02-27 MED ORDER — NICOTINE 7 MG/24HR TD PT24: 7.0000 mg | MEDICATED_PATCH | Freq: Every day | TRANSDERMAL | Status: DC

## 2024-02-27 MED ORDER — PROCHLORPERAZINE MALEATE 5 MG PO TABS: 5.0000 mg | ORAL_TABLET | Freq: Four times a day (QID) | ORAL | Status: DC | PRN

## 2024-02-27 NOTE — Progress Notes (Signed)
 Physical Therapy Treatment Patient Details Name: Frank Fischer MRN: [id number] DOB: Jul 04, 1960 Today's Date: 02/27/2024   History of Present Illness Pt is a 63 y.o. male who presented 02/21/24 with L gaze and R-sided weakness. Pt received TNK. S/p L carotid terminus and M1 thrombectomy 10/4. MRI showed large early subacute left MCA territory infarct, predominantly posterior, with small petechial hemorrhage. PMH: multiple prior strokes (R sided, with residual mild L hemiplegia), prostate cancer (s/p brachytherapy, 2018), polysubstance use (marijuana, crack, tobacco, alcohol), HTN, depression, BPH    PT Comments  Pt appropriate throughout session, follows one-step verbal and tactile commands this date fairly consistently. Pt tolerating x4 transfers into standing, overall requiring mod assist for initiation and rise. Once standing, pt inattentive to R and struggles with pre-gait and gait tasks. Pt requiring max facilitation RLE to progress to stepping, pt performing 2x5 ft gait with HHA and max PT physical assist. Would benefit from +2, one for trunk support/weight shifting and one for RLE management. Pt remains a good AIR candidate.      If plan is discharge home, recommend the following: A lot of help with walking and/or transfers;A lot of help with bathing/dressing/bathroom   Can travel by private vehicle        Equipment Recommendations  Rolling walker (2 wheels);BSC/3in1;Wheelchair (measurements PT);Wheelchair cushion (measurements PT)    Recommendations for Other Services       Precautions / Restrictions Precautions Precautions: Fall Recall of Precautions/Restrictions: Impaired Precaution/Restrictions Comments: SBP < 160, bilat mitts, CIWA Restrictions Weight Bearing Restrictions Per Provider Order: No     Mobility  Bed Mobility Overal bed mobility: Needs Assistance Bed Mobility: Supine to Sit, Sit to Supine     Supine to sit: Min assist Sit to supine: Mod assist, +2 for  physical assistance   General bed mobility comments: assist for completion of LE translation to EOB, righting balance given R lateral bias. Pt able to scoot self forward with cues. MOd +2 for return to supine for LE lift, trunk lower, and boost up +2.    Transfers Overall transfer level: Needs assistance Equipment used: Rolling walker (2 wheels), 1 person hand held assist Transfers: Sit to/from Stand Sit to Stand: Mod assist           General transfer comment: assist for power up, rise, steady. PT standing on pt's R to facilitate rise. stand x4 total during session.    Ambulation/Gait Ambulation/Gait assistance: Max assist Gait Distance (Feet): 5 Feet Assistive device: 1 person hand held assist Gait Pattern/deviations: Step-to pattern, Decreased step length - right, Decreased step length - left, Decreased stride length, Decreased weight shift to right, Decreased weight shift to left Gait velocity: decr   Pre-gait activities: weight shift L/R at hips, RLE marching in standing with PT facilitating, finding midline with RW for support General Gait Details: assist to steady at waist, weight shift, RLE progression during swing phase as pt with little to no initiation of RLE lifting. 2x5 ft gait bouts   Stairs             Wheelchair Mobility     Tilt Bed    Modified Rankin (Stroke Patients Only) Modified Rankin (Stroke Patients Only) Pre-Morbid Rankin Score: No symptoms Modified Rankin: Moderately severe disability     Balance Overall balance assessment: Needs assistance Sitting-balance support: No upper extremity supported, Feet supported Sitting balance-Leahy Scale: Fair Sitting balance - Comments: able to sit upright today after correcting initial R lateral bias, benefits from starting on L  elbow propped to correct Postural control: Right lateral lean Standing balance support: Bilateral upper extremity supported, During functional activity, Reliant on assistive  device for balance Standing balance-Leahy Scale: Poor Standing balance comment: reliant on external assist                            Communication Communication Communication: Impaired Factors Affecting Communication: Difficulty expressing self;Reduced clarity of speech  Cognition Arousal: Alert Behavior During Therapy: Flat affect   PT - Cognitive impairments: Difficult to assess, Attention, Awareness, Initiation, Sequencing, Problem solving, Safety/Judgement Difficult to assess due to: Impaired communication                     PT - Cognition Comments: expressive and receptive difficulties, responds best to demonstrative cues. fidgeting and pulling at lines/leads. Pt is more consistently following one step verbal commands today, like sit up, scoot forward, stand up Following commands: Impaired Following commands impaired: Follows one step commands inconsistently, Follows one step commands with increased time    Cueing Cueing Techniques: Verbal cues, Tactile cues, Visual cues, Gestural cues  Exercises      General Comments        Pertinent Vitals/Pain Pain Assessment Pain Assessment: Faces Faces Pain Scale: Hurts even more Pain Location: grimacing intermittently with general mobility Pain Descriptors / Indicators: Grimacing Pain Intervention(s): Limited activity within patient's tolerance, Monitored during session, Repositioned    Home Living                          Prior Function            PT Goals (current goals can now be found in the care plan section) Acute Rehab PT Goals Patient Stated Goal: did not/unable to state PT Goal Formulation: Patient unable to participate in goal setting Time For Goal Achievement: 03/07/24 Potential to Achieve Goals: Good Progress towards PT goals: Progressing toward goals    Frequency    Min 3X/week      PT Plan      Co-evaluation              AM-PAC PT 6 Clicks Mobility    Outcome Measure  Help needed turning from your back to your side while in a flat bed without using bedrails?: A Lot Help needed moving from lying on your back to sitting on the side of a flat bed without using bedrails?: A Lot Help needed moving to and from a bed to a chair (including a wheelchair)?: A Lot Help needed standing up from a chair using your arms (e.g., wheelchair or bedside chair)?: A Lot Help needed to walk in hospital room?: Total Help needed climbing 3-5 steps with a railing? : Total 6 Click Score: 10    End of Session Equipment Utilized During Treatment: Gait belt Activity Tolerance: Patient tolerated treatment well;Patient limited by fatigue Patient left: with call bell/phone within reach;in bed;with bed alarm set;Other (comment) (bilat mitts) Nurse Communication: Mobility status PT Visit Diagnosis: Unsteadiness on feet (R26.81);Other symptoms and signs involving the nervous system (R29.898);Hemiplegia and hemiparesis Hemiplegia - Right/Left: Right Hemiplegia - dominant/non-dominant: Dominant Hemiplegia - caused by: Cerebral infarction     Time: 8765-8746 PT Time Calculation (min) (ACUTE ONLY): 19 min  Charges:    $Therapeutic Activity: 8-22 mins PT General Charges $$ ACUTE PT VISIT: 1 Visit  Celester Lech S, PT DPT Acute Rehabilitation Services Secure Chat Preferred  Office [phone]    Johana FORBES Kingdom 02/27/2024, 1:05 PM

## 2024-02-27 NOTE — Plan of Care (Signed)
  Problem: Education: Goal: Knowledge of disease or condition will improve Outcome: Not Progressing Goal: Knowledge of secondary prevention will improve (MUST DOCUMENT ALL) Outcome: Not Progressing Goal: Knowledge of patient specific risk factors will improve (DELETE if not current risk factor) Outcome: Not Progressing   Problem: Ischemic Stroke/TIA Tissue Perfusion: Goal: Complications of ischemic stroke/TIA will be minimized Outcome: Not Progressing   Problem: Coping: Goal: Will verbalize positive feelings about self Outcome: Not Progressing

## 2024-02-27 NOTE — H&P (Signed)
 Physical Medicine and Rehabilitation Admission H&P    Chief Complaint  Patient presents with   Functional deficits due to CVA   : HPI: Frank Fischer is a 63 year old male with PMHx of hx of multiple prior strokes (R sided, with residual mild L hemiplegia), prostate cancer (s/p brachytherapy, 2018), polysubstance use (marijuana, crack, tobacco, alcohol), HTN, depression, and BPH. Patient presented to [facility] on 02/21/2024 with a left gaze preference and right hemiparesis. In the Emergency Department, his NIHSS score was 25. The patient was last seen normal at 11:30 PM. According to the chart, the patient was found down after going to the bathroom with left gaze and right-sided weakness.  A CT head revealed hypodensity in the right frontal, bilateral, and parietal lobes with a hyperdense left MCA. A CT angiogram of the head and neck showed a left ICA and M1 occlusion. The patient was administered TNK and underwent mechanical thrombectomy successfully. An MRI confirmed a large subacute left MCA infarct with a small petechial hemorrhage. Neurology recommended aspirin  alone due to the large size of the strokes. 2D echo revealed LVEF 60 to 65% with normal function. Hgb A1c 6.1.  UDS positive for THC and cocaine, Ethanol 130 (in ED).    The hospital course was complicated by severe agitation and an elevated CIWAscore, with the patient having a history of daily alcohol use (six-pack per day). Prior to arrival, the patient was independent but did not drive and lived in a one-level home with a level entry with his spouse. He currently requires moderate assistance, +2 for physical assistance, +2 for safety/equipment, Cueing for sequencing for functional mobility.  Needs assist for power up, rise, steady for transfers. Therapy evaluations completed due to patient decreased functional mobility was admitted for a comprehensive rehab program.    Review of Systems  Unable to perform ROS: Mental acuity    Past Medical History:  Diagnosis Date   Arthritis    Cervical stenosis of spine    Chronic headaches    Dental caries    periodontal disease   Frequency of urination    History of cerebral infarction 11-28-2017  per pt never had symptoms   per MRI imaging 10-31-2016 2 small chronic lacunar infartions in the right frontal periventricular white matter , and chronic left medial orbital fracture (as seen 2014 MRI)   Hypertension    Noncompliance with medication regimen    Prostate cancer [facility]) urologist-  dr winter/  oncologist-  dr patrcia   dx 07-21-2017--- Stage T1c, Gleason 4+3,  PSA 13.40,  vol 49.7cc--  scheduled for radioactive seed implants 12-03-2017   Past Surgical History:  Procedure Laterality Date   ABDOMINAL EXPLORATION SURGERY  age 2   per pt had Small Bowel Resection and Appendectomy   APPENDECTOMY     ARM SURGERY Left 1999   REPAIR INJURY-- ARM CUT ABOUT OFF   IR CATHETER TUBE CHANGE  10/28/2021   IR CT HEAD LTD  02/21/2024   IR GUIDED DRAIN W CATHETER PLACEMENT  09/03/2021   IR PERCUTANEOUS Frank THROMBECTOMY/INFUSION INTRACRANIAL INC DIAG ANGIO  02/21/2024   IR US  GUIDANCE  09/03/2021   IR US  GUIDE VASC ACCESS RIGHT  02/21/2024   PROSTATE BIOPSY  07/21/2017  dr winter office   RADIOACTIVE SEED IMPLANT N/A 12/03/2017   Procedure: RADIOACTIVE SEED IMPLANT/BRACHYTHERAPY IMPLANT;  Surgeon: Devere Lonni Righter, MD;  Location: [facility];  Service: Urology;  Laterality: N/A;  ONLY NEEDS 120 MIN  RADIOLOGY WITH ANESTHESIA N/A 02/21/2024   Procedure: RADIOLOGY WITH ANESTHESIA;  Surgeon: Radiologist, Medication, MD;  Location: [facility] OR;  Service: Radiology;  Laterality: N/A;   SPACE OAR INSTILLATION N/A 12/03/2017   Procedure: SPACE OAR INSTILLATION;  Surgeon: Devere Lonni Righter, MD;  Location: [facility];  Service: Urology;  Laterality: N/A;   TOOTH EXTRACTION N/A 09/21/2018   Procedure: DENTAL EXTRACTIONS WITH ALVEOLIPLASTY;  Surgeon:  Sheryle Hamilton, DDS;  Location: [facility] OR;  Service: Oral Surgery;  Laterality: N/A;   Family History  Problem Relation Age of Onset   Lung cancer Mother    Hypertension Brother    Diabetes Paternal Grandfather    Prostate cancer Cousin    Social History:  reports that he has been smoking cigarettes. He has a 10 pack-year smoking history. He has never used smokeless tobacco. He reports current alcohol use of about 21.0 standard drinks of alcohol per week. He reports current drug use. Drug: Marijuana. Allergies:  Allergies  Allergen Reactions   Penicillins Itching   Medications Prior to Admission  Medication Sig Dispense Refill   albuterol  (VENTOLIN  HFA) 108 (90 Base) MCG/ACT inhaler Inhale 2 puffs into the lungs every 6 (six) hours as needed for wheezing or shortness of breath. 1 each 3   atorvastatin  (LIPITOR ) 80 MG tablet Take 1 tablet (80 mg total) by mouth daily. 90 tablet 1   ezetimibe  (ZETIA ) 10 MG tablet Take 1 tablet (10 mg total) by mouth daily. 90 tablet 3   lisinopril  (ZESTRIL ) 40 MG tablet Take 1 tablet (40 mg total) by mouth every morning. 90 tablet 1   naproxen  (NAPROSYN ) 375 MG tablet Take 1 tablet (375 mg total) by mouth 2 (two) times daily with a meal. 10 tablet 0   triamcinolone  cream (KENALOG ) 0.1 % Apply 1 Application topically 2 (two) times daily. (Patient taking differently: Apply 1 Application topically 2 (two) times daily as needed (skin irritation).) 30 g 0   amLODipine  (NORVASC ) 10 MG tablet Take 1 tablet (10 mg total) by mouth daily. (Patient not taking: Reported on 02/21/2024) 30 tablet 3   aspirin  EC 81 MG tablet Take 81 mg by mouth daily. (Patient not taking: Reported on 02/21/2024)     buPROPion  (WELLBUTRIN  SR) 200 MG 12 hr tablet Take 1 tablet (200 mg total) by mouth 2 (two) times daily. (Patient not taking: Reported on 02/21/2024) 180 tablet 1   tamsulosin  (FLOMAX ) 0.4 MG CAPS capsule Take 1 capsule (0.4 mg total) by mouth daily. (Patient not taking: Reported on  02/21/2024) 30 capsule 11      Home: Home Living Family/patient expects to be discharged to:: Private residence Living Arrangements: Spouse/significant other Available Help at Discharge: Family Home Access: Level entry Home Layout: One level Bathroom Shower/Tub: Proofreader:  (pt with difficulty reporting) Additional Comments: may need to confirm at later date due to expressive and receptive deficits, unsure of accuracy   Functional History: Prior Function Prior Level of Function : Independent/Modified Independent Mobility Comments: no AD ADLs Comments: does not drive  Functional Status:  Mobility: Bed Mobility Overal bed mobility: Needs Assistance Bed Mobility: Supine to Sit, Sit to Supine Supine to sit: Min assist Sit to supine: Mod assist, +2 for physical assistance General bed mobility comments: assist for completion of LE translation to EOB, righting balance given R lateral bias. Pt able to scoot self forward with cues. MOd +2 for return to supine for LE lift, trunk lower, and boost up +2. Transfers Overall transfer  level: Needs assistance Equipment used: Rolling walker (2 wheels), 1 person hand held assist Transfers: Sit to/from Stand Sit to Stand: Mod assist Bed to/from chair/wheelchair/BSC transfer type:: Step pivot (up the bed) Step pivot transfers: Mod assist, +2 physical assistance General transfer comment: assist for power up, rise, steady. PT standing on pt's R to facilitate rise. stand x4 total during session. Ambulation/Gait Ambulation/Gait assistance: Max assist Gait Distance (Feet): 5 Feet Assistive device: 1 person hand held assist Gait Pattern/deviations: Step-to pattern, Decreased step length - right, Decreased step length - left, Decreased stride length, Decreased weight shift to right, Decreased weight shift to left General Gait Details: assist to steady at waist, weight shift, RLE progression during swing phase as pt with little to  no initiation of RLE lifting. 2x5 ft gait bouts Gait velocity: decr Gait velocity interpretation: <1.31 ft/sec, indicative of household ambulator Pre-gait activities: weight shift L/R at hips, RLE marching in standing with PT facilitating, finding midline with RW for support    ADL: ADL Overall ADL's : Needs assistance/impaired Grooming: Moderate assistance, Sitting, Wash/dry face Grooming Details (indicate cue type and reason): Pt with multimodal cues was able to use LUE to wash behind R ear and face. Upper Body Bathing: Moderate assistance Lower Body Bathing: +2 for physical assistance, +2 for safety/equipment, Maximal assistance Upper Body Dressing : Moderate assistance Lower Body Dressing: Maximal assistance, Bed level Lower Body Dressing Details (indicate cue type and reason): will lift legs off the bed to don socks Toilet Transfer: Moderate assistance, +2 for physical assistance, +2 for safety/equipment Toilet Transfer Details (indicate cue type and reason): simulated through sit<>stand at EOB Toileting- Clothing Manipulation and Hygiene: Maximal assistance, Bed level Functional mobility during ADLs: Moderate assistance, +2 for physical assistance, +2 for safety/equipment, Cueing for sequencing (2 person face to face) General ADL Comments: R inattention, R side deficits, visual deficits?, decreased balance, cognition, activity tolerance  Cognition: Cognition Overall Cognitive Status: Impaired/Different from baseline Arousal/Alertness: Awake/alert Orientation Level: Oriented to person Attention: Sustained Sustained Attention: Impaired Sustained Attention Impairment: Functional basic Awareness: Impaired Awareness Impairment: Intellectual impairment Problem Solving: Impaired Problem Solving Impairment: Functional basic Cognition Arousal: Alert Behavior During Therapy: Flat affect Overall Cognitive Status: Impaired/Different from baseline  Physical Exam: Blood pressure (!)  127/91, pulse 78, temperature 97.7 F (36.5 C), temperature source Axillary, resp. rate 14, weight 93 kg, SpO2 95%. Physical Exam Constitutional:      Appearance: He is ill-appearing.     Comments: lethargic  HENT:     Head: Normocephalic.     Right Ear: External ear normal.     Left Ear: External ear normal.  Eyes:     Pupils: Pupils are equal, round, and reactive to light.  Cardiovascular:     Rate and Rhythm: Normal rate and regular rhythm.     Heart sounds: No murmur heard.    No gallop.  Pulmonary:     Effort: Pulmonary effort is normal. No respiratory distress.     Breath sounds: No wheezing.  Abdominal:     General: Bowel sounds are normal. There is no distension.     Tenderness: There is no abdominal tenderness.  Musculoskeletal:        General: No swelling or tenderness. Normal range of motion.     Cervical back: Normal range of motion.  Skin:    General: Skin is warm.     Findings: Bruising present.  Neurological:     Comments: Pt is slow to respond. Gaze fixed forward. Pt with  significant dysarthria. Word finding deficits. Very distracted. Right central VII. Decreased oromotor control. Was able to tell me his name. Otherwise he was unable to respond or speech was unintelligible for basic questions and commands. He did move all 4 limbs spontaneously. Decreased response to pain on right as opposed to left.   Psychiatric:     Comments: Flat distracted     Results for orders placed or performed during the hospital encounter of 02/21/24 (from the past 48 hours)  CBC     Status: None   Collection Time: 02/26/24  4:25 AM  Result Value Ref Range   WBC 6.2 4.0 - 10.5 K/uL   RBC 4.47 4.22 - 5.81 MIL/uL   Hemoglobin 14.2 13.0 - 17.0 g/dL   HCT 57.3 60.9 - 47.9 %   MCV 95.3 80.0 - 100.0 fL   MCH 31.8 26.0 - 34.0 pg   MCHC 33.3 30.0 - 36.0 g/dL   RDW 85.7 88.4 - 84.4 %   Platelets 281 150 - 400 K/uL   nRBC 0.0 0.0 - 0.2 %    Comment: Performed at [facility]  Lab, [address] [place], [place] [address]  Basic metabolic panel     Status: Abnormal   Collection Time: 02/26/24  4:25 AM  Result Value Ref Range   Sodium 139 135 - 145 mmol/L   Potassium 3.5 3.5 - 5.1 mmol/L   Chloride 104 98 - 111 mmol/L   CO2 22 22 - 32 mmol/L   Glucose, Bld 94 70 - 99 mg/dL    Comment: Glucose reference range applies only to samples taken after fasting for at least 8 hours.   BUN 9 8 - 23 mg/dL   Creatinine, Ser 8.84 0.61 - 1.24 mg/dL   Calcium  8.8 (L) 8.9 - 10.3 mg/dL   GFR, Estimated >39 >39 mL/min    Comment: (NOTE) Calculated using the CKD-EPI Creatinine Equation (2021)    Anion gap 13 5 - 15    Comment: Performed at [facility], [address] [place], [place] [address]  Magnesium     Status: None   Collection Time: 02/26/24  4:25 AM  Result Value Ref Range   Magnesium 2.1 1.7 - 2.4 mg/dL    Comment: Performed at [facility], [address], [place], [place] [address]  Phosphorus     Status: Abnormal   Collection Time: 02/26/24  4:25 AM  Result Value Ref Range   Phosphorus 4.8 (H) 2.5 - 4.6 mg/dL    Comment: Performed at [facility], [address], [place], [place] [address]   No results found.    Blood pressure (!) 127/91, pulse 78, temperature 97.7 F (36.5 C), temperature source Axillary, resp. rate 14, weight 93 kg, SpO2 95%.  Medical Problem List and Plan: 1. 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  -patient may shower  -ELOS/Goals: 15-20 days, min assist to supervision goals with PT, OT, SLP 2.  Antithrombotics: -DVT/anticoagulation:  Mechanical: Sequential compression devices, below knee Bilateral lower extremities Pharmaceutical: Lovenox   -antiplatelet therapy: Aspirin  3. Pain Management: Tylenol  prn  4. Mood/Behavior/Sleep: LCSW to follow for evaluation and support when available.   -antipsychotic agents:  Wellbutrin   - CIWA =9 on 10/9--- receiving Ativan  1 mg as needed ??  Changed to p.o. 5. Neuropsych/cognition: This patient is not capable of making decisions on his own behalf. 6. Skin/Wound Care: Routine  pressure relief measures 7. Fluids/Electrolytes/Nutrition: Monitor intake and output.  Follow-up chemistries in AM.  - Ensure+ high protein +multivitamin+folic acid    -encourage adequate PO 8.  Hypertension: on home Amlodipine  10 mg and lisinopril  40 mg 9. BPH: Flomax  0.4 mg 10. HLD: LDL 111 goal < 70--Zetia  10 mg and atorvastatin  80 mg 11.  Poststroke dysphagia: SLP consult continue dysphagia 3 12.  Tobacco abuse: nicotine patch 7 mg  13.  Polysubstance abuse: Tox screen positive for cocaine and marijuana. 14.  Acute alcohol withdrawal: History of dependence drinking at least 6 beers daily. On 10/7 pt experienced restlessness/agitation and bradycardia concerning for ETOH withdrawal.   -continue Thiamine   -10/8 placed on phenobarbital taper agitation improving, continue taper   -10/9 CIWA =9--- receiving Ativan  1 mg as needed ??  Changed to p.o. 15.  Hypokalemia:K+replaced with 40 mEq on 10/8. Now 3.5 and stable. Continue to monitor       Daphne LOISE Satterfield, NP 02/27/2024

## 2024-02-27 NOTE — Progress Notes (Signed)
 Signed      Expand All Collapse All PMR Admission Coordinator Pre-Admission Assessment   Patient: Frank Fischer is an 63 y.o., male MRN: [id number] DOB: [birth year] Height:   Weight: 93 kg   Insurance Information HMO:     PPO:      PCP:      IPA:      80/20:      OTHER:  PRIMARY: Medicare Part A and B      Policy#: [id number]       Subscriber: pt CM Name:       Phone#:      Fax#:  Pre-Cert#: verified Health and safety inspector:  Benefits:  Phone #:      Name:  Eff. Date: A/B 02/18/24     Deduct: $1676      Out of Pocket Max: n/a      Life Max: n/a CIR: 100%      SNF: 20 full days Outpatient: 80%     Co-Pay: 20% Home Health: 100%      Co-Pay:  DME: 80%     Co-Pay: 20% Providers:  SECONDARY: Medicaid [place]      Policy#: [id number]     Phone#: [phone]   Financial Counselor:       Phone#:    The "Data Collection Information Summary" for patients in Inpatient Rehabilitation Facilities with attached "Privacy Act Statement-Health Care Records" was provided and verbally reviewed with: Patient and Family   Emergency Contact Information Contact Information       Name Relation Home Work Mobile    Frank Fischer Brother [phone]        Frank Fischer     [phone]         Other Contacts   None on File        Current Medical History  Patient Admitting Diagnosis: CVA    History of Present Illness: Pt is a 39 male with PMH of multiple prior strokes, prostate cancer, PSA, HTN, depression, BPH who was admitted on 10/4 to [facility] with L gaze preference and R hemiparesis.  In ED NIHSS 25.  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  He was given TNK and underwent mechanical thrombectomy which was successful.  MRI confirmed large subacute L MCA infarct with small petechial hemorrahge.  A1C 6.1, UDS positive for THC and cocaine.  He was recommended for aspirin  alone given large size of stroke.  Therapy ongoing and pt was recommended for CIR.    Complete NIHSS TOTAL: 9    Patient's medical record from Frank Fischer has been reviewed by the rehabilitation admission coordinator and physician.   Past Medical History      Past Medical History:  Diagnosis Date   Arthritis     Cervical stenosis of spine     Chronic headaches     Dental caries      periodontal disease   Frequency of urination     History of cerebral infarction 11-28-2017  per pt never had symptoms    per MRI imaging 10-31-2016 2 small chronic lacunar infartions in the right frontal periventricular white matter , and chronic left medial orbital fracture (as seen 2014 MRI)   Hypertension     Noncompliance with medication regimen     Prostate cancer [facility]) urologist-  dr winter/  oncologist-  dr patrcia    dx 07-21-2017--- Stage T1c, Gleason 4+3,  PSA 13.40,  vol 49.7cc--  scheduled for radioactive seed implants 12-03-2017  Has the patient had major surgery during 100 days prior to admission? Yes   Family History   family history includes Diabetes in his paternal grandfather; Hypertension in his brother; Lung cancer in his mother; Prostate cancer in his cousin.   Current Medications  Current Medications    Current Facility-Administered Medications:    acetaminophen  (TYLENOL ) tablet 650 mg, 650 mg, Oral, Q4H PRN **OR** acetaminophen  (TYLENOL ) 160 MG/5ML solution 650 mg, 650 mg, Per Tube, Q4H PRN **OR** acetaminophen  (TYLENOL ) suppository 650 mg, 650 mg, Rectal, Q4H PRN, Bui, Xem M, MD   albuterol  (PROVENTIL ) (2.5 MG/3ML) 0.083% nebulizer solution 2.5 mg, 2.5 mg, Nebulization, Q4H PRN, Bui, Xem M, MD   amLODipine  (NORVASC ) tablet 10 mg, 10 mg, Oral, Daily, Bui, Xem M, MD, 10 mg at 02/27/24 9183   aspirin  chewable tablet 81 mg, 81 mg, Oral, Daily, Waddell Aquas A, NP, 81 mg at 02/27/24 9182   atorvastatin  (LIPITOR ) tablet 80 mg, 80 mg, Oral, Daily, Waddell Aquas A, NP, 80 mg at 02/27/24 0816   buPROPion  ER (WELLBUTRIN  SR) 12 hr tablet 200 mg, 200 mg, Oral, BID WC, Bui, Xem M, MD, 200 mg  at 02/27/24 9182   Chlorhexidine Gluconate Cloth 2 % PADS 6 each, 6 each, Topical, Q0600, Bui, Xem M, MD, 6 each at 02/27/24 0557   enoxaparin  (LOVENOX ) injection 40 mg, 40 mg, Subcutaneous, Daily, Waddell Aquas A, NP, 40 mg at 02/27/24 0817   ezetimibe  (ZETIA ) tablet 10 mg, 10 mg, Oral, Daily, Bui, Xem M, MD, 10 mg at 02/27/24 0816   feeding supplement (ENSURE PLUS HIGH PROTEIN) liquid 237 mL, 237 mL, Oral, BID BM, Sethi, Pramod S, MD, 237 mL at 02/27/24 9182   folic acid  (FOLVITE ) tablet 1 mg, 1 mg, Oral, Daily, Bui, Xem M, MD, 1 mg at 02/27/24 9183   labetalol  (NORMODYNE ) injection 20 mg, 20 mg, Intravenous, Once PRN **AND** [DISCONTINUED] clevidipine (CLEVIPREX) infusion 0.5 mg/mL, 0-21 mg/hr, Intravenous, Continuous, Bui, Xem M, MD   lisinopril  (ZESTRIL ) tablet 40 mg, 40 mg, Oral, Daily, Bui, Xem M, MD, 40 mg at 02/27/24 9183   LORazepam  (ATIVAN ) injection 1 mg, 1 mg, Intravenous, Q4H PRN, Gherghe, Costin M, MD, 1 mg at 02/26/24 1310   multivitamin with minerals tablet 1 tablet, 1 tablet, Oral, Daily, Bui, Xem M, MD, 1 tablet at 02/27/24 0817   nicotine (NICODERM CQ - dosed in mg/24 hr) patch 7 mg, 7 mg, Transdermal, Daily, Bui, Xem M, MD, 7 mg at 02/27/24 0817   [COMPLETED] PHENobarbital (LUMINAL) tablet 97.2 mg, 97.2 mg, Oral, Q8H, 97.2 mg at 02/27/24 0416 **FOLLOWED BY** PHENobarbital (LUMINAL) tablet 64.8 mg, 64.8 mg, Oral, Q8H **FOLLOWED BY** [START ON 02/29/2024] PHENobarbital (LUMINAL) tablet 32.4 mg, 32.4 mg, Oral, Q8H, Gherghe, Costin M, MD   senna-docusate (Senokot-S) tablet 1 tablet, 1 tablet, Oral, QHS PRN, Bui, Xem M, MD   tamsulosin  (FLOMAX ) capsule 0.4 mg, 0.4 mg, Oral, Daily, Bui, Xem M, MD, 0.4 mg at 02/27/24 9182   thiamine  (VITAMIN B1) tablet 100 mg, 100 mg, Oral, Daily, 100 mg at 02/27/24 0816 **OR** thiamine  (VITAMIN B1) injection 100 mg, 100 mg, Intravenous, Daily, Trudy Harlene SAILOR, NP     Patients Current Diet:  Diet Order                  DIET DYS 3 Room service  appropriate? Yes with Assist; Fluid consistency: Thin  Diet effective now  Precautions / Restrictions Precautions Precautions: Fall Precaution/Restrictions Comments: SBP < 160, bilat mitts, CIWA Restrictions Weight Bearing Restrictions Per Provider Order: No    Has the patient had 2 or more falls or a fall with injury in the past year? Yes   Prior Activity Level Limited Community (1-2x/wk): independent prior to admit no driving   Prior Functional Level Self Care: Did the patient need help bathing, dressing, using the toilet or eating? Independent   Indoor Mobility: Did the patient need assistance with walking from room to room (with or without device)? Independent   Stairs: Did the patient need assistance with internal or external stairs (with or without device)? Independent   Functional Cognition: Did the patient need help planning regular tasks such as shopping or remembering to take medications? Independent   Patient Information Are you of Hispanic, Latino/a,or Spanish origin?: A. No, not of Hispanic, Latino/a, or Spanish origin, X. Patient unable to respond What is your race?: B. Black or African American, X. Patient unable to respond Do you need or want an interpreter to communicate with a doctor or health care staff?: 0. No Patient information obtained via proxy : per wife   Patient's Response To:  Health Literacy and Transportation Is the patient able to respond to health literacy and transportation needs?: No Health Literacy - How often do you need to have someone help you when you read instructions, pamphlets, or other written material from your doctor or pharmacy?: Patient unable to respond In the past 12 months, has lack of transportation kept you from medical appointments or from getting medications?: No In the past 12 months, has lack of transportation kept you from meetings, work, or from getting things needed for daily living?: No Health  Literacy and Transportation obtained via proxy: per wife   Journalist, newspaper / Equipment Home Equipment:  (pt with difficulty reporting)   Prior Device Use: Indicate devices/aids used by the patient prior to current illness, exacerbation or injury? None of the above   Current Functional Level Cognition   Arousal/Alertness: Awake/alert Overall Cognitive Status: Impaired/Different from baseline Orientation Level: Oriented to person Attention: Sustained Sustained Attention: Impaired Sustained Attention Impairment: Functional basic Awareness: Impaired Awareness Impairment: Intellectual impairment Problem Solving: Impaired Problem Solving Impairment: Functional basic    Extremity Assessment (includes Sensation/Coordination)   Upper Extremity Assessment: RUE deficits/detail RUE Deficits / Details: Pt attempting to hold items with max multimodal cues RUE Coordination: decreased fine motor, decreased gross motor LUE Deficits / Details: unsure if affected by prior CVA, but slowed initiation on L. Withdrew to noxious stimuli.  Lower Extremity Assessment: Defer to PT evaluation RLE Deficits / Details: withdrew to noxious stimuli but with delay; grossly </= 3- strength; incoordination noted     ADLs   Overall ADL's : Needs assistance/impaired Grooming: Moderate assistance, Sitting, Wash/dry face Grooming Details (indicate cue type and reason): Pt with multimodal cues was able to use LUE to wash behind R ear and face. Upper Body Bathing: Moderate assistance Lower Body Bathing: +2 for physical assistance, +2 for safety/equipment, Maximal assistance Upper Body Dressing : Moderate assistance Lower Body Dressing: Maximal assistance, Bed level Lower Body Dressing Details (indicate cue type and reason): will lift legs off the bed to don socks Toilet Transfer: Moderate assistance, +2 for physical assistance, +2 for safety/equipment Toilet Transfer Details (indicate cue type and reason):  simulated through sit<>stand at EOB Toileting- Clothing Manipulation and Hygiene: Maximal assistance, Bed level Functional mobility during ADLs: Moderate assistance, +2 for physical assistance, +  2 for safety/equipment, Cueing for sequencing (2 person face to face) General ADL Comments: R inattention, R side deficits, visual deficits?, decreased balance, cognition, activity tolerance     Mobility   Overal bed mobility: Needs Assistance Bed Mobility: Supine to Sit, Sit to Supine Supine to sit: HOB elevated, Mod assist Sit to supine: Mod assist, +2 for physical assistance, +2 for safety/equipment, HOB elevated General bed mobility comments: trunk and LE management, scooting to EOB, correcting R lateral bias     Transfers   Overall transfer level: Needs assistance Equipment used: 2 person hand held assist Transfers: Sit to/from Stand Sit to Stand: Mod assist, +2 physical assistance, From elevated surface Bed to/from chair/wheelchair/BSC transfer type:: Step pivot (up the bed) Step pivot transfers: Mod assist, +2 physical assistance General transfer comment: assist for power up, rise, steady. Repeated stands x3 with improvement each time, step up the bed towards L with facilitation RLE progression and at trunk.     Ambulation / Gait / Stairs / Wheelchair Mobility   Ambulation/Gait Ambulation/Gait assistance: Mod assist, +2 physical assistance, +2 safety/equipment Gait Distance (Feet): 3 Feet Assistive device: 2 person hand held assist Gait Pattern/deviations: Step-to pattern, Decreased step length - right, Decreased step length - left, Decreased stride length, Decreased weight shift to right, Decreased weight shift to left General Gait Details: focused on transfers and pregait Gait velocity: decr Gait velocity interpretation: <1.31 ft/sec, indicative of household ambulator Pre-gait activities: weight shift L and R at hips, RLE marching with PT facilitating, finding upright and midline      Posture / Balance Dynamic Sitting Balance Sitting balance - Comments: heavy R lateral bias, requiring max external cues to correct and mod physical assist Balance Overall balance assessment: Needs assistance Sitting-balance support: No upper extremity supported, Feet supported Sitting balance-Leahy Scale: Poor Sitting balance - Comments: heavy R lateral bias, requiring max external cues to correct and mod physical assist Postural control: Right lateral lean Standing balance support: Bilateral upper extremity supported, During functional activity, Reliant on assistive device for balance Standing balance-Leahy Scale: Poor Standing balance comment: reliant on external assist     Special considerations/life events  Diabetic management yes    Previous Home Environment (from acute therapy documentation) Living Arrangements: Spouse/significant other Available Help at Discharge: Family Home Layout: One level Home Access: Level entry Bathroom Shower/Tub: Tub/shower unit Additional Comments: may need to confirm at later date due to expressive and receptive deficits, unsure of accuracy   Discharge Living Setting Plans for Discharge Living Setting: Patient's home, Lives with (comment) (spouse) Type of Home at Discharge: Apartment Discharge Home Layout: One level Discharge Home Access: Level entry Discharge Bathroom Shower/Tub: Tub/shower unit Discharge Bathroom Toilet: Standard Discharge Bathroom Accessibility: Yes How Accessible: Accessible via walker Does the patient have any problems obtaining your medications?: No   Social/Family/Support Systems Patient Roles: Spouse Anticipated Caregiver: spouse, [name] Anticipated Caregiver's Contact Information: [phone] Ability/Limitations of Caregiver: none, she is a CNA 3hrs/week Caregiver Availability: 24/7 Discharge Plan Discussed with Primary Caregiver: Yes Is Caregiver In Agreement with Plan?: Yes   Goals Patient/Family Goal for  Rehab: PT/OT/SLP supervision to min assist Expected length of stay: 14-16 days Additional Information: Discharge plan: home with spouse who can provide 24/7 care.  She is a CNA 3 hrs/week and can either take time off or take him with her Pt/Family Agrees to Admission and willing to participate: Yes Program Orientation Provided & Reviewed with Pt/Caregiver Including Roles  & Responsibilities: Yes   Decrease burden of Care  through IP rehab admission: n/a   Possible need for SNF placement upon discharge:  Not anticipated.  Plan for discharge home with spouse who is a CNA and can provide physical assist.     Patient Condition: I have reviewed medical records from [facility], spoken with [facility] team, and patient and spouse. I met with patient at the bedside and discussed via phone for inpatient rehabilitation assessment.  Patient will benefit from ongoing PT, OT, and SLP, can actively participate in 3 hours of therapy a day 5 days of the week, and can make measurable gains during the admission.  Patient will also benefit from the coordinated team approach during an Inpatient Acute Rehabilitation admission.  The patient will receive intensive therapy as well as Rehabilitation physician, nursing, social worker, and care management interventions.  Due to bladder management, bowel management, safety, skin/wound care, disease management, medication administration, pain management, and patient education the patient requires 24 hour a day rehabilitation nursing.  The patient is currently mod assist with mobility and basic ADLs.  Discharge setting and therapy post discharge at home with home health is anticipated.  Patient has agreed to participate in the Acute Inpatient Rehabilitation Program and will admit 02/27/24.   Preadmission Screen Completed By:  Reche FORBES Lowers, PT, DPT 02/27/2024 11:16 AM ______________________________________________________________________   Discussed status with Dr. Babs on 02/27/24   at 11:16 AM and received approval for admission today.   Admission Coordinator:  Caitlin E Warren, PT, DPT time 11:16 AM/Date 02/27/24     Assessment/Plan: Diagnosis: left MCA infarct Does the need for close, 24 hr/day Medical supervision in concert with the patient's rehab needs make it unreasonable for this patient to be served in a less intensive setting? Yes Co-Morbidities requiring supervision/potential complications: prostate cancer, htn, depression Due to bladder management, bowel management, safety, skin/wound care, disease management, medication administration, pain management, and patient education, does the patient require 24 hr/day rehab nursing? Yes Does the patient require coordinated care of a physician, rehab nurse, PT, OT, and SLP to address physical and functional deficits in the context of the above medical diagnosis(es)? Yes Addressing deficits in the following areas: balance, endurance, locomotion, strength, transferring, bowel/bladder control, bathing, dressing, feeding, grooming, toileting, cognition, speech, language, swallowing, and psychosocial support Can the patient actively participate in an intensive therapy program of at least 3 hrs of therapy 5 days a week? Yes The potential for patient to make measurable gains while on inpatient rehab is excellent Anticipated functional outcomes upon discharge from inpatient rehab: supervision and min assist PT, supervision and min assist OT, min assist SLP Estimated rehab length of stay to reach the above functional goals is: 14-16 days Anticipated discharge destination: Home 10. Overall Rehab/Functional Prognosis: excellent     MD Signature: Arthea IVAR Babs, MD, [facility] Physical Medicine & Rehabilitation Medical Director Rehabilitation Services 02/27/2024

## 2024-02-27 NOTE — Discharge Summary (Signed)
 Physician Discharge Summary  Frank Fischer FMW:[id number] DOB: 30-Dec-1960 DOA: 02/21/2024  PCP: Leonarda Roxan BROCKS, NP  Admit date: 02/21/2024 Discharge date: 02/27/2024  Admitted From: home Disposition:  CIR  Recommendations for Outpatient Follow-up:  Continue phenobarbital taper as per pharmacy for 4 additional days  Home Health: none Equipment/Devices: none  Discharge Condition: stable CODE STATUS: Full code Diet Orders (From admission, onward)     Start     Ordered   02/22/24 1044  DIET DYS 3 Room service appropriate? Yes with Assist; Fluid consistency: Thin  Diet effective now       Comments: Meds whole in puree  Question Answer Comment  Room service appropriate? Yes with Assist   Fluid consistency: Thin      02/22/24 1043            Brief Narrative / Interim history: Frank Fischer is a 63 y.o. 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  Subsequently treated with TNK and thrombectomy was transition to progressive care.  Today, patient is noted to have increasing agitation and restlessness, tachycardia, hypertension, giving concerns for possible alcohol withdrawal.  Patient's history of drinking at least 6 beers a day along with other polysubstance use including cocaine, marijuana, tobacco.   Hospital Course / Discharge diagnoses: Principal problem Acute alcohol withdrawal - History of alcohol dependence drinking at least 6 beers a day.  Noted ethanol 130 on presentation 10/4.  Hospitalist team consulted 10/7 due to increased restlessness/agitation, bradycardia with concern for alcohol withdrawal.  Has been withdrawing significantly, more so 10/8, started on phenobarbital taper, now improving, calm, will continue taper for few additional days    Active problems Acute ischemic CVA left MCA - S/p mechanical thrombectomy and TNK left carotid terminus and M1 occlusion.  Underwent full stroke workup with 2D echocardiogram LVEF 65 to 65%, LDL 111, A1c 6.1, currently on aspirin  81 mg.  Will discharge to CIR for rehab  Hypertension - Continue home medications  Hyperlipidemia -continue atorvastatin  and Zetia   Tobacco abuse -continue nicotine patch Hypokalemia-replenished  Polysubstance abuse - Noted tox screen on presentation positive for cocaine and marijuana.  Poststroke dysphagia - Evaluated by SLP. Continues on mechanical soft (dysphagia 3) with thin liquids.  He is eating well per RN BPH -continue Flomax   Sepsis ruled out   Discharge Instructions   Allergies as of 02/27/2024       Reactions   Penicillins Itching        Medication List     STOP taking these medications    naproxen  375 MG tablet Commonly known as: NAPROSYN        TAKE these medications    albuterol  108 (90 Base) MCG/ACT inhaler Commonly known as: VENTOLIN  HFA Inhale 2 puffs into the lungs every 6 (six) hours as needed for wheezing or shortness of breath.   amLODipine  10 MG tablet Commonly known as: NORVASC  Take 1 tablet (10 mg total) by mouth daily.   aspirin  EC 81 MG tablet Take 81 mg by mouth daily.   atorvastatin  80 MG tablet Commonly known as: LIPITOR  Take 1 tablet (80 mg total) by mouth daily.   buPROPion  200 MG 12 hr tablet Commonly known as: WELLBUTRIN  SR Take 1 tablet (200 mg total) by mouth 2 (two) times daily.   ezetimibe  10 MG tablet Commonly known  as: Zetia  Take 1 tablet (10 mg total) by mouth daily.   feeding supplement Liqd Take 237 mLs by mouth 2 (two) times daily between meals.   folic acid  1 MG tablet Commonly known as: FOLVITE  Take 1 tablet (1 mg total) by mouth daily. Start taking on: February 28, 2024   lisinopril  40 MG tablet Commonly known as: ZESTRIL  Take 1 tablet (40 mg total) by mouth every morning.    multivitamin with minerals Tabs tablet Take 1 tablet by mouth daily. Start taking on: February 28, 2024   nicotine 7 mg/24hr patch Commonly known as: NICODERM CQ - dosed in mg/24 hr Place 1 patch (7 mg total) onto the skin daily. Start taking on: February 28, 2024   PHENobarbital 32.4 MG tablet Commonly known as: LUMINAL Take 2 tablets (64.8 mg total) by mouth every 8 (eight) hours for 2 days, THEN 1 tablet (32.4 mg total) every 8 (eight) hours for 2 days. Start taking on: February 27, 2024   tamsulosin  0.4 MG Caps capsule Commonly known as: FLOMAX  Take 1 capsule (0.4 mg total) by mouth daily.   thiamine  100 MG tablet Commonly known as: Vitamin B-1 Take 1 tablet (100 mg total) by mouth daily. Start taking on: February 28, 2024   triamcinolone  cream 0.1 % Commonly known as: KENALOG  Apply 1 Application topically 2 (two) times daily. What changed:  when to take this reasons to take this       Consultations: Neurology  Procedures/Studies:  MR BRAIN WO CONTRAST Result Date: 02/22/2024 EXAM: MRI BRAIN WITHOUT CONTRAST MRA HEAD WITHOUT CONTRAST 02/22/2024 12:35:18 AM TECHNIQUE: Multiplanar multisequence MRI of the brain was performed without the administration of intravenous contrast. MRA of the head was performed without contrast using time-of-flight technique. 3D postprocessing with multiplanar reformations and MIPs was performed for better evaluation of the vasculature. COMPARISON: 02/26/2019 CLINICAL HISTORY: Stroke, follow up. FINDINGS: MRI BRAIN: BRAIN AND VENTRICLES: Large early subacute infarct of the left MCA territory predominantly affecting the posterior region. Small amount of petechial hemorrhage in the infarcted region. Multifocal hyperintense T2-weighted signal within the cerebral white matter, most commonly due to chronic small vessel disease. Multiple old infarcts of the right MCA and PCA territories. Abnormal left ICA flow void. No mass or abnormal enhancement. No  midline shift. No hydrocephalus. The sella is unremarkable. Normal flow voids. ORBITS: No acute abnormality. SINUSES AND MASTOIDS: No acute abnormality. BONES AND SOFT TISSUES: Normal bone marrow signal. No acute soft tissue abnormality. MRA HEAD: ANTERIOR CIRCULATION: There is no flow related enhancement within the left ICA on the time of flight angiographic images. No significant stenosis of the right internal carotid artery. There is normal flow related enhancement throughout the left MCA. No significant stenosis of the right middle cerebral artery. No significant stenosis of the anterior cerebral arteries. No aneurysm. POSTERIOR CIRCULATION: Unchanged severe stenosis of the right PCA P2 segment. No significant stenosis of the left posterior cerebral artery. No significant stenosis of the basilar artery. No significant stenosis of the vertebral arteries. No aneurysm. IMPRESSION: 1. Large early subacute left MCA territory infarct, predominantly posterior, with small petechial hemorrhage (Heidelberg class 1). 2. Left ICA occlusion.  Left MCA is now patent. 3. Multiple chronic infarcts in the right MCA and PCA territories. 4. 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 Electronically signed by: Franky Stanford MD 02/22/2024 12:52 AM EDT RP Workstation: [id number]   MR ANGIO HEAD WO CONTRAST Result Date: 02/22/2024 EXAM: MRI BRAIN WITHOUT CONTRAST MRA HEAD WITHOUT CONTRAST  02/22/2024 12:35:18 AM TECHNIQUE: Multiplanar multisequence MRI of the brain was performed without the administration of intravenous contrast. MRA of the head was performed without contrast using time-of-flight technique. 3D postprocessing with multiplanar reformations and MIPs was performed for better evaluation of the vasculature. COMPARISON: 02/26/2019 CLINICAL HISTORY: Stroke, follow up. FINDINGS: MRI BRAIN: BRAIN AND VENTRICLES: Large early subacute infarct of the left MCA territory predominantly affecting the posterior region.  Small amount of petechial hemorrhage in the infarcted region. Multifocal hyperintense T2-weighted signal within the cerebral white matter, most commonly due to chronic small vessel disease. Multiple old infarcts of the right MCA and PCA territories. Abnormal left ICA flow void. No mass or abnormal enhancement. No midline shift. No hydrocephalus. The sella is unremarkable. Normal flow voids. ORBITS: No acute abnormality. SINUSES AND MASTOIDS: No acute abnormality. BONES AND SOFT TISSUES: Normal bone marrow signal. No acute soft tissue abnormality. MRA HEAD: ANTERIOR CIRCULATION: There is no flow related enhancement within the left ICA on the time of flight angiographic images. No significant stenosis of the right internal carotid artery. There is normal flow related enhancement throughout the left MCA. No significant stenosis of the right middle cerebral artery. No significant stenosis of the anterior cerebral arteries. No aneurysm. POSTERIOR CIRCULATION: Unchanged severe stenosis of the right PCA P2 segment. No significant stenosis of the left posterior cerebral artery. No significant stenosis of the basilar artery. No significant stenosis of the vertebral arteries. No aneurysm. IMPRESSION: 1. Large early subacute left MCA territory infarct, predominantly posterior, with small petechial hemorrhage (Heidelberg class 1). 2. Left ICA occlusion.  Left MCA is now patent. 3. Multiple chronic infarcts in the right MCA and PCA territories. 4. 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 Electronically signed by: Franky Stanford MD 02/22/2024 12:52 AM EDT RP Workstation: [id number]   ECHOCARDIOGRAM COMPLETE Result Date: 02/21/2024    ECHOCARDIOGRAM REPORT   Patient Name:   SAEL FURCHES Date of Exam: 02/21/2024 Medical Rec #:  [id number]      Height:       70.0 in Accession #:    [id number]     Weight:       205.0 lb Date of Birth:  Dec 13, 1960     BSA:          2.109 m Patient Age:    62 years       BP:            150/74 mmHg Patient Gender: M              HR:           50 bpm. Exam Location:  Inpatient Procedure: 2D Echo, 3D Echo, Color Doppler and Cardiac Doppler (Both Spectral            and Color Flow Doppler were utilized during procedure). Indications:    Stroke I63.9  History:        Patient has prior history of Echocardiogram examinations, most                 recent 02/26/2019. Stroke, Arrythmias:Bradycardia; Risk                 Factors:Hypertension, Current Smoker and PSA.  Sonographer:    Koleen Popper RDCS Referring Phys: [id number] NORMIE M BUI IMPRESSIONS  1. Left ventricular ejection fraction, by estimation, is 60 to 65%. Left ventricular ejection fraction by 3D volume is 52 %. The left ventricle has normal function. The left ventricle has no regional wall  motion abnormalities. Left ventricular diastolic  parameters were normal.  2. Right ventricular systolic function is normal. The right ventricular size is normal. Tricuspid regurgitation signal is inadequate for assessing PA pressure.  3. The mitral valve is normal in structure. No evidence of mitral valve regurgitation. No evidence of mitral stenosis.  4. The aortic valve was not well visualized. Aortic valve regurgitation is not visualized. No aortic stenosis is present.  5. The inferior vena cava is normal in size with greater than 50% respiratory variability, suggesting right atrial pressure of 3 mmHg.  6. Increased flow velocities may be secondary to anemia, thyrotoxicosis, hyperdynamic or high flow state. Comparison(s): No significant change from prior study. FINDINGS  Left Ventricle: Left ventricular ejection fraction, by estimation, is 60 to 65%. Left ventricular ejection fraction by 3D volume is 52 %. The left ventricle has normal function. The left ventricle has no regional wall motion abnormalities. Strain was performed and the global longitudinal strain is indeterminate. The left ventricular internal cavity size was normal in size. There is no left  ventricular hypertrophy. Left ventricular diastolic parameters were normal. Right Ventricle: The right ventricular size is normal. No increase in right ventricular wall thickness. Right ventricular systolic function is normal. Tricuspid regurgitation signal is inadequate for assessing PA pressure. Left Atrium: Left atrial size was normal in size. Right Atrium: Right atrial size was normal in size. Pericardium: There is no evidence of pericardial effusion. Mitral Valve: The mitral valve is normal in structure. No evidence of mitral valve regurgitation. No evidence of mitral valve stenosis. Tricuspid Valve: The tricuspid valve is normal in structure. Tricuspid valve regurgitation is not demonstrated. No evidence of tricuspid stenosis. Aortic Valve: The aortic valve was not well visualized. Aortic valve regurgitation is not visualized. No aortic stenosis is present. Aortic valve mean gradient measures 6.0 mmHg. Aortic valve peak gradient measures 13.5 mmHg. Aortic valve area, by VTI measures 2.53 cm. Pulmonic Valve: The pulmonic valve was normal in structure. Pulmonic valve regurgitation is not visualized. No evidence of pulmonic stenosis. Aorta: The aortic root is normal in size and structure. Venous: The inferior vena cava is normal in size with greater than 50% respiratory variability, suggesting right atrial pressure of 3 mmHg. IAS/Shunts: No atrial level shunt detected by color flow Doppler. Additional Comments: 3D was performed not requiring image post processing on an independent workstation and was indeterminate.  LEFT VENTRICLE PLAX 2D LVIDd:         4.50 cm         Diastology LVIDs:         3.24 cm         LV e' medial:    11.60 cm/s LV PW:         0.85 cm         LV E/e' medial:  8.8 LV IVS:        1.09 cm         LV e' lateral:   13.50 cm/s LVOT diam:     1.81 cm         LV E/e' lateral: 7.6 LV SV:         80 LV SV Index:   38 LVOT Area:     2.57 cm        3D Volume EF                                LV 3D  EF:    Left                                             ventricul LV Volumes (MOD)                            ar LV vol d, MOD    132.0 ml                   ejection A4C:                                        fraction LV vol s, MOD    51.6 ml                    by 3D A4C:                                        volume is LV SV MOD A4C:   132.0 ml                   52 %.                                 3D Volume EF:                                3D EF:        52 %                                LV EDV:       144 ml                                LV ESV:       69 ml                                LV SV:        75 ml RIGHT VENTRICLE             IVC RV Basal diam:  3.98 cm     IVC diam: 1.92 cm RV S prime:     18.20 cm/s TAPSE (M-mode): 2.4 cm LEFT ATRIUM             Index        RIGHT ATRIUM           Index LA diam:        2.86 cm 1.36 cm/m   RA Area:     12.60 cm LA Vol (A2C):   55.2 ml 26.17 ml/m  RA Volume:   26.90 ml  12.75 ml/m LA Vol (A4C):   26.8 ml 12.70 ml/m LA Biplane Vol: 37.8 ml 17.92 ml/m  AORTIC VALVE AV Area (Vmax):    2.28 cm AV Area (Vmean):  2.22 cm AV Area (VTI):     2.53 cm AV Vmax:           184.00 cm/s AV Vmean:          102.000 cm/s AV VTI:            0.317 m AV Peak Grad:      13.5 mmHg AV Mean Grad:      6.0 mmHg LVOT Vmax:         163.00 cm/s LVOT Vmean:        88.100 cm/s LVOT VTI:          0.312 m LVOT/AV VTI ratio: 0.98  AORTA Ao Root diam: 2.37 cm MITRAL VALVE MV Area (PHT): 3.42 cm     SHUNTS MV Decel Time: 222 msec     Systemic VTI:  0.31 m MV E velocity: 102.00 cm/s  Systemic Diam: 1.81 cm MV A velocity: 66.60 cm/s MV E/A ratio:  1.53 Vishnu Priya Mallipeddi Electronically signed by Diannah Late Mallipeddi Signature Date/Time: 02/21/2024/4:42:33 PM    Final    IR PERCUTANEOUS ART THROMBECTOMY/INFUSION INTRACRANIAL INC DIAG ANGIO Result Date: 02/21/2024 PROCEDURE PERFORMED: 1. Cerebral angiography with stroke thrombectomy 2. Ultrasound guided vascular access 3. Cone beam CT  for treatment planning COMPARISON:  CT angiogram of the head and neck performed February 21, 2024 CLINICAL DATA:  63 year old male with acute ischemic stroke and symptoms of right-sided plegia, aphasia, and gaze preference. NIH stroke scale measures 25. Patient was a candidate for thrombolysis and also mechanical thrombectomy and presented to interventional radiology for treatment. INDICATION: Acute ischemic stroke. ANESTHESIA/SEDATION: General anesthesia was utilized for the procedure. CONTRAST:  Approximately 50 cc Ominipque 300 MEDICATIONS: See MAR FLUOROSCOPY TIME:  Fluoroscopy Time: 12 minutes, (628 mGy). COMPLICATIONS: None immediate. BODY OF REPORT: Following a full explanation of the procedure along with the potential associated complications, an informed witnessed consent was obtained. The patient was then placed under general anesthesia by the Department of Anesthesiology at [facility]. The right femoral access site was prepped and draped in the usual sterile fashion. Ultrasound was used to study the right common femoral artery which was patent. Using real-time ultrasound guidance, a 19 gauge introducer needle was used to access the right common femoral artery. Access was performed at 01:49. A hard copy image ultrasound the saved and stored in PACS. Using this access, a 6 French sheath was placed in the descending thoracic aorta. Next, selective catheterization the left internal carotid artery was performed. 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 Pretreatment TICI score 0. Next, red 72 was advanced into the distal left ICA. The first pass was performed at 0203. This resulted in partial extraction of thrombus from the carotid terminus. Next, selective catheterization of the left M1 segment was performed and an EMBO trap was deployed within the left M1 segment and distal ICA. This resulted in partial extraction of  the left M1 thrombus and the residual thrombus within the distal left ICA. This pass was performed at 02:15. Finally, a third pass was performed using a penumbra 43 catheter into a proximal left M2 branch occlusion which successfully yielded thrombus. This pass was performed at 02:25. There was no significant residual thromboembolic disease. Mild plaque was present within the distal left intracranial ICA. A mild stenosis is also present in the proximal left ICA origin which was estimated at less than 50% based on NASCET criteria. Post treatment TICI  score 3. After reviewing the imaging, I elected to terminate the procedure at this point. Evaluation of the right femoral access site demonstrated that the site was suitable for a closure device. A 6 French Angio-Seal device was deployed without complication. Cone beam CT was then performed to evaluate for intracranial hemorrhage and treatment planning. This demonstrated minimal contrast staining. The patient was removed from anesthesia and transferred to recovery in stable condition. IMPRESSION: 1. Suction and stent retriever assisted thrombectomy of left carotid terminus and M1 occlusion. 2. Post-treatment TICI score = 3. PLAN: 1. To ICU for routine postoperative supportive care. Electronically Signed   By: Maude Naegeli M.D.   On: 02/21/2024 09:15   IR US  Guide Vasc Access Right Result Date: 02/21/2024 PROCEDURE PERFORMED: 1. Cerebral angiography with stroke thrombectomy 2. Ultrasound guided vascular access 3. Cone beam CT for treatment planning COMPARISON:  CT angiogram of the head and neck performed February 21, 2024 CLINICAL DATA:  63 year old male with acute ischemic stroke and symptoms of right-sided plegia, aphasia, and gaze preference. NIH stroke scale measures 25. Patient was a candidate for thrombolysis and also mechanical thrombectomy and presented to interventional radiology for treatment. INDICATION: Acute ischemic stroke. ANESTHESIA/SEDATION: General  anesthesia was utilized for the procedure. CONTRAST:  Approximately 50 cc Ominipque 300 MEDICATIONS: See MAR FLUOROSCOPY TIME:  Fluoroscopy Time: 12 minutes, (628 mGy). COMPLICATIONS: None immediate. BODY OF REPORT: Following a full explanation of the procedure along with the potential associated complications, an informed witnessed consent was obtained. The patient was then placed under general anesthesia by the Department of Anesthesiology at [facility]. The right femoral access site was prepped and draped in the usual sterile fashion. Ultrasound was used to study the right common femoral artery which was patent. Using real-time ultrasound guidance, a 19 gauge introducer needle was used to access the right common femoral artery. Access was performed at 01:49. A hard copy image ultrasound the saved and stored in PACS. Using this access, a 6 French sheath was placed in the descending thoracic aorta. Next, selective catheterization the left internal carotid artery was performed. 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 Pretreatment TICI score 0. Next, red 72 was advanced into the distal left ICA. The first pass was performed at 0203. This resulted in partial extraction of thrombus from the carotid terminus. Next, selective catheterization of the left M1 segment was performed and an EMBO trap was deployed within the left M1 segment and distal ICA. This resulted in partial extraction of the left M1 thrombus and the residual thrombus within the distal left ICA. This pass was performed at 02:15. Finally, a third pass was performed using a penumbra 43 catheter into a proximal left M2 branch occlusion which successfully yielded thrombus. This pass was performed at 02:25. There was no significant residual thromboembolic disease. Mild plaque was present within the distal left intracranial ICA. A mild stenosis is also present in the  proximal left ICA origin which was estimated at less than 50% based on NASCET criteria. Post treatment TICI score 3. After reviewing the imaging, I elected to terminate the procedure at this point. Evaluation of the right femoral access site demonstrated that the site was suitable for a closure device. A 6 French Angio-Seal device was deployed without complication. Cone beam CT was then performed to evaluate for intracranial hemorrhage and treatment planning. This demonstrated minimal contrast staining. The patient was removed from anesthesia and transferred to  recovery in stable condition. IMPRESSION: 1. Suction and stent retriever assisted thrombectomy of left carotid terminus and M1 occlusion. 2. Post-treatment TICI score = 3. PLAN: 1. To ICU for routine postoperative supportive care. Electronically Signed   By: Maude Naegeli M.D.   On: 02/21/2024 09:15   IR CT Head Ltd Result Date: 02/21/2024 PROCEDURE PERFORMED: 1. Cerebral angiography with stroke thrombectomy 2. Ultrasound guided vascular access 3. Cone beam CT for treatment planning COMPARISON:  CT angiogram of the head and neck performed February 21, 2024 CLINICAL DATA:  63 year old male with acute ischemic stroke and symptoms of right-sided plegia, aphasia, and gaze preference. NIH stroke scale measures 25. Patient was a candidate for thrombolysis and also mechanical thrombectomy and presented to interventional radiology for treatment. INDICATION: Acute ischemic stroke. ANESTHESIA/SEDATION: General anesthesia was utilized for the procedure. CONTRAST:  Approximately 50 cc Ominipque 300 MEDICATIONS: See MAR FLUOROSCOPY TIME:  Fluoroscopy Time: 12 minutes, (628 mGy). COMPLICATIONS: None immediate. BODY OF REPORT: Following a full explanation of the procedure along with the potential associated complications, an informed witnessed consent was obtained. The patient was then placed under general anesthesia by the Department of Anesthesiology at [facility]. The right femoral access site was prepped and draped in the usual sterile fashion. Ultrasound was used to study the right common femoral artery which was patent. Using real-time ultrasound guidance, a 19 gauge introducer needle was used to access the right common femoral artery. Access was performed at 01:49. A hard copy image ultrasound the saved and stored in PACS. Using this access, a 6 French sheath was placed in the descending thoracic aorta. Next, selective catheterization the left internal carotid artery was performed. 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 Pretreatment TICI score 0. Next, red 72 was advanced into the distal left ICA. The first pass was performed at 0203. This resulted in partial extraction of thrombus from the carotid terminus. Next, selective catheterization of the left M1 segment was performed and an EMBO trap was deployed within the left M1 segment and distal ICA. This resulted in partial extraction of the left M1 thrombus and the residual thrombus within the distal left ICA. This pass was performed at 02:15. Finally, a third pass was performed using a penumbra 43 catheter into a proximal left M2 branch occlusion which successfully yielded thrombus. This pass was performed at 02:25. There was no significant residual thromboembolic disease. Mild plaque was present within the distal left intracranial ICA. A mild stenosis is also present in the proximal left ICA origin which was estimated at less than 50% based on NASCET criteria. Post treatment TICI score 3. After reviewing the imaging, I elected to terminate the procedure at this point. Evaluation of the right femoral access site demonstrated that the site was suitable for a closure device. A 6 French Angio-Seal device was deployed without complication. Cone beam CT was then performed to evaluate for intracranial hemorrhage and treatment  planning. This demonstrated minimal contrast staining. The patient was removed from anesthesia and transferred to recovery in stable condition. IMPRESSION: 1. Suction and stent retriever assisted thrombectomy of left carotid terminus and M1 occlusion. 2. Post-treatment TICI score = 3. PLAN: 1. To ICU for routine postoperative supportive care. Electronically Signed   By: Maude Naegeli M.D.   On: 02/21/2024 09:15   CT C-SPINE NO CHARGE Result Date: 02/21/2024 EXAM: CT CERVICAL SPINE WITHOUT CONTRAST 02/21/2024 01:19:35 AM TECHNIQUE: CT of the cervical  spine was performed without the administration of intravenous contrast. Multiplanar reformatted images are provided for review. Automated exposure control, iterative reconstruction, and/or weight based adjustment of the mA/kV was utilized to reduce the radiation dose to as low as reasonably achievable. COMPARISON: None available. CLINICAL HISTORY: Neck trauma (Age >= 65y); Neck trauma, intoxicated or obtunded (Age >= 16y). FINDINGS: BONES AND ALIGNMENT: No acute fracture or traumatic malalignment. DEGENERATIVE CHANGES: No significant degenerative changes. SOFT TISSUES: No prevertebral soft tissue swelling. IMPRESSION: 1. No acute abnormality of the cervical spine. Electronically signed by: Franky Stanford MD 02/21/2024 01:33 AM EDT RP Workstation: [id number]   CT ANGIO HEAD NECK W WO CM (CODE STROKE) Result Date: 02/21/2024 EXAM: CTA HEAD AND NECK WITH AND WITHOUT 02/21/2024 01:18:14 AM TECHNIQUE: CTA of the head and neck was performed with and without the administration of 75 mL of intravenous iohexol  (OMNIPAQUE ) 350 MG/ML injection. Multiplanar 2D and/or 3D reformatted images are provided for review. Automated exposure control, iterative reconstruction, and/or weight based adjustment of the mA/kV was utilized to reduce the radiation dose to as low as reasonably achievable. Stenosis of the internal carotid arteries measured using NASCET criteria. COMPARISON: None  available CLINICAL HISTORY: Neuro deficit, acute, stroke suspected. Stroke, Rt side weakness, hx strokes; Dr. Sallyann. FINDINGS: CTA NECK: AORTIC ARCH AND ARCH VESSELS: No dissection or arterial injury. No significant stenosis of the brachiocephalic or subclavian arteries. CERVICAL CAROTID ARTERIES: Progressive narrowing of the distal left internal carotid artery beginning at the petrous segment with occlusion at the distal cavernous segment. There is reconstitution of the supraclinoid portion with attenuated enhancement. The right internal carotid artery is patent without significant stenosis or dissection. No dissection or arterial injury is identified in the remaining cervical carotid arteries. CERVICAL VERTEBRAL ARTERIES: No dissection, arterial injury, or significant stenosis. LUNGS AND MEDIASTINUM: Biapical Emphysema. SOFT TISSUES: No acute abnormality. BONES: No acute abnormality. CTA HEAD: ANTERIOR CIRCULATION: The left internal carotid artery demonstrates progressive narrowing beginning at the petrous segment with occlusion at the distal cavernous segment. There is reconstitution of the supraclinoid portion with attenuated enhancement. The left MCA is occluded at its origin with poor distal collateralization. Moderate stenosis of the right MCA M1 segment. No significant stenosis of the anterior cerebral arteries. No aneurysm. POSTERIOR CIRCULATION: Mild stenosis of the right PCA p2 segment. Mild atherosclerotic irregularity of the left PCA without high-grade stenosis. No significant stenosis of the basilar artery. No significant stenosis of the vertebral arteries. No aneurysm. OTHER: No dural venous sinus thrombosis on this non-dedicated study. IMPRESSION: 1. Emergent large vessel occlusion of the left MCA at its origin with poor distal collateralization. 2. Progressive narrowing of the distal left internal carotid artery beginning at the petrous segment with occlusion at the distal cavernous segment and  reconstitution of the supraclinoid portion with attenuated enhancement. 3. Moderate stenosis of the right MCA M1 segment. 4. Mild stenosis of the right PCA P2 segment. 5. Mild atherosclerotic irregularity of the left PCA without high-grade stenosis. Critical findings communicated to Dr. MAGDALENO Sallyann via telephone at 1:28 AM on 02/21/24. Electronically signed by: Franky Stanford MD 02/21/2024 01:32 AM EDT RP Workstation: [id number]   CT HEAD CODE STROKE WO CONTRAST Result Date: 02/21/2024 EXAM: CT HEAD WITHOUT CONTRAST 02/21/2024 12:40:34 AM TECHNIQUE: CT of the head was performed without the administration of intravenous contrast. Automated exposure control, iterative reconstruction, and/or weight based adjustment of the mA/kV was utilized to reduce the radiation dose to as low as reasonably achievable. COMPARISON: 02/26/2019 CLINICAL HISTORY: Neuro deficit,  acute, stroke suspected. Stroke, Rt side weakness. FINDINGS: BRAIN AND VENTRICLES: No acute hemorrhage. There are old infarcts of the right MCA and PCA territory. There is a focal area of loss of gray-white differentiation in the posterior left MCA territory. ASPECTS is 8. Hyperdense left MCA. No hydrocephalus, extra-axial collection, mass effect, or midline shift. ASPECTS ([place] stroke program early CT score) - ganglionic level infarction (caudate, lentiform nuclei, internal capsule, insula, M1-m3 cortex): 6 - supraganglionic infarction (m4-m6 cortex): 2 Total score (0-10 with 10 being normal): 8 ORBITS: No acute abnormality. SINUSES: No acute abnormality. SOFT TISSUES AND SKULL: No acute soft tissue abnormality. No skull fracture. IMPRESSION: 1. Acute/early subacute left MCA territory infarct with focal loss of gray-white differentiation (ASPECTS 8) and hyperdense left MCA. 2. No intracranial hemorrhage. 3. 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 Findings communicated to Dr. MAGDALENO Blower via [facility] text page at 12:50 AM on 02/21/24. Electronically signed by:  Franky Stanford MD 02/21/2024 12:52 AM EDT RP Workstation: [id number]     Subjective: - no chest pain, shortness of breath, no abdominal pain, nausea or vomiting.   Discharge Exam: BP 134/87 (BP Location: Left Arm)   Pulse 73   Temp 97.7 F (36.5 C) (Oral)   Resp 17   Wt 93 kg   SpO2 97%   BMI 29.42 kg/m   General: Pt is alert, awake, not in acute distress Cardiovascular: RRR, S1/S2 +, no rubs, no gallops Respiratory: CTA bilaterally, no wheezing, no rhonchi Abdominal: Soft, NT, ND, bowel sounds + Extremities: no edema, no cyanosis    The results of significant diagnostics from this hospitalization (including imaging, microbiology, ancillary and laboratory) are listed below for reference.     Microbiology: Recent Results (from the past 240 hours)  Resp panel by RT-PCR (RSV, Flu A&B, Covid) Anterior Nasal Swab     Status: None   Collection Time: 02/21/24  1:11 AM   Specimen: Anterior Nasal Swab  Result Value Ref Range Status   SARS Coronavirus 2 by RT PCR NEGATIVE NEGATIVE Final   Influenza A by PCR NEGATIVE NEGATIVE Final   Influenza B by PCR NEGATIVE NEGATIVE Final    Comment: (NOTE) The Xpert Xpress SARS-CoV-2/FLU/RSV plus assay is intended as an aid in the diagnosis of influenza from Nasopharyngeal swab specimens and should not be used as a sole basis for treatment. Nasal washings and aspirates are unacceptable for Xpert Xpress SARS-CoV-2/FLU/RSV testing.  Fact Sheet for Patients: BloggerCourse.com  Fact Sheet for Healthcare Providers: SeriousBroker.it  This test is not yet approved or cleared by the [place]  FDA and has been authorized for detection and/or diagnosis of SARS-CoV-2 by FDA under an Emergency Use Authorization (EUA). This EUA will remain in effect (meaning this test can be used) for the duration of the COVID-19 declaration under Section 564(b)(1) of the Act, 21 U.S.C. section 360bbb-3(b)(1),  unless the authorization is terminated or revoked.     Resp Syncytial Virus by PCR NEGATIVE NEGATIVE Final    Comment: (NOTE) Fact Sheet for Patients: BloggerCourse.com  Fact Sheet for Healthcare Providers: SeriousBroker.it  This test is not yet approved or cleared by the [place]  FDA and has been authorized for detection and/or diagnosis of SARS-CoV-2 by FDA under an Emergency Use Authorization (EUA). This EUA will remain in effect (meaning this test can be used) for the duration of the COVID-19 declaration under Section 564(b)(1) of the Act, 21 U.S.C. section 360bbb-3(b)(1), unless the authorization is terminated or revoked.  Performed at [facility]  [facility], [address] [place], [place] [address]   MRSA Next Gen by PCR, Nasal     Status: None   Collection Time: 02/21/24  6:14 AM   Specimen: Nasal Swab  Result Value Ref Range Status   MRSA by PCR Next Gen NOT DETECTED NOT DETECTED Final    Comment: (NOTE) The GeneXpert MRSA Assay (FDA approved for NASAL specimens only), is one component of a comprehensive MRSA colonization surveillance program. It is not intended to diagnose MRSA infection nor to guide or monitor treatment for MRSA infections. Test performance is not FDA approved in patients less than 8 years old. Performed at [facility], [address], [place], [place] [address]      Labs: Basic Metabolic Panel: Recent Labs  Lab 02/21/24 0035 02/21/24 0036 02/21/24 0834 02/22/24 0510 02/23/24 0646 02/26/24 0425  NA 138 141 141 139 136 139  K 3.8 3.8 3.9 3.8 3.4* 3.5  CL 105 106 108 109 104 104  CO2 18*  --  21* 23 21* 22  GLUCOSE 114* 115* 95 96 99 94  BUN 8 9 6* 7* 7* 9  CREATININE 0.96 1.10 0.95 1.03 1.04 1.15  CALCIUM  8.8*  --  8.5* 8.2* 8.3* 8.8*  MG  --   --   --   --   --  2.1  PHOS  --   --   --   --   --  4.8*   Liver Function Tests: Recent Labs  Lab 02/21/24 0035  AST 20   ALT 23  ALKPHOS 89  BILITOT 0.2  PROT 6.9  ALBUMIN 4.0   CBC: Recent Labs  Lab 02/21/24 0035 02/21/24 0036 02/21/24 0834 02/22/24 0510 02/23/24 0646 02/26/24 0425  WBC 5.4  --  7.4 7.6 5.9 6.2  NEUTROABS 4.0  --  5.4 4.5 4.1  --   HGB 15.4 15.6 14.4 13.7 13.6 14.2  HCT 46.1 46.0 42.2 40.7 39.7 42.6  MCV 95.4  --  94.0 95.1 95.7 95.3  PLT 258  --  271 245 199 281   CBG: Recent Labs  Lab 02/21/24 0032 02/22/24 1548 02/22/24 2016  GLUCAP 117* 111* 113*   Hgb A1c No results for input(s): HGBA1C in the last 72 hours. Lipid Profile No results for input(s): CHOL, HDL, LDLCALC, TRIG, CHOLHDL, LDLDIRECT in the last 72 hours. Thyroid function studies No results for input(s): TSH, T4TOTAL, T3FREE, THYROIDAB in the last 72 hours.  Invalid input(s): FREET3 Urinalysis    Component Value Date/Time   COLORURINE YELLOW 01/10/2024 1046   APPEARANCEUR HAZY (A) 01/10/2024 1046   LABSPEC 1.015 01/10/2024 1046   PHURINE 5.0 01/10/2024 1046   GLUCOSEU NEGATIVE 01/10/2024 1046   HGBUR MODERATE (A) 01/10/2024 1046   BILIRUBINUR NEGATIVE 01/10/2024 1046   BILIRUBINUR negative 07/31/2022 0000   KETONESUR NEGATIVE 01/10/2024 1046   PROTEINUR NEGATIVE 01/10/2024 1046   UROBILINOGEN 0.2 07/31/2022 0000   UROBILINOGEN 0.2 04/30/2013 1111   NITRITE POSITIVE (A) 01/10/2024 1046   LEUKOCYTESUR LARGE (A) 01/10/2024 1046    FURTHER DISCHARGE INSTRUCTIONS:   Get Medicines reviewed and adjusted: Please take all your medications with you for your next visit with your Primary MD   Laboratory/radiological data: Please request your Primary MD to go over all hospital tests and procedure/radiological results at the follow up, please ask your Primary MD to get all Hospital records sent to his/her office.   In some cases, they will be blood work, cultures and biopsy  results pending at the time of your discharge. Please request that your primary care M.D. goes through all  the records of your hospital data and follows up on these results.   Also Note the following: If you experience worsening of your admission symptoms, develop shortness of breath, life threatening emergency, suicidal or homicidal thoughts you must seek medical attention immediately by calling 911 or calling your MD immediately  if symptoms less severe.   You must read complete instructions/literature along with all the possible adverse reactions/side effects for all the Medicines you take and that have been prescribed to you. Take any new Medicines after you have completely understood and accpet all the possible adverse reactions/side effects.    Do not drive when taking Pain medications or sleeping medications (Benzodaizepines)   Do not take more than prescribed Pain, Sleep and Anxiety Medications. It is not advisable to combine anxiety,sleep and pain medications without talking with your primary care practitioner   Special Instructions: If you have smoked or chewed Tobacco  in the last 2 yrs please stop smoking, stop any regular Alcohol  and or any Recreational drug use.   Wear Seat belts while driving.   Please note: You were cared for by a hospitalist during your hospital stay. Once you are discharged, your primary care physician will handle any further medical issues. Please note that NO REFILLS for any discharge medications will be authorized once you are discharged, as it is imperative that you return to your primary care physician (or establish a relationship with a primary care physician if you do not have one) for your post hospital discharge needs so that they can reassess your need for medications and monitor your lab values.  Time coordinating discharge: 35 minutes  SIGNED:  Nilda Fendt, MD, PhD 02/27/2024, 11:24 AM

## 2024-02-27 NOTE — Progress Notes (Signed)
 Inpatient Rehabilitation Admission Medication Review by a Pharmacist   A complete drug regimen review was completed for this patient to identify any potential clinically significant medication issues.   High Risk Drug Classes Is patient taking? Indication by Medication  Antipsychotic Yes Compazine  prn N/V  Anticoagulant No    Antibiotic No    Opioid No    Antiplatelet Yes Aspirin  - CVA  Hypoglycemics/insulin No    Vasoactive Medication Yes Amlodipine , lisinopril  - BP Tamsulosin  - BPH   Chemotherapy No    Other Yes Thiamine , folic acid , MVI, phenobarbital - alcohol withdrawal   Trazodone prn sleep Albuterol  prn SOB Atorvastatin  - HLD Bupropion  - mood Ezetimibe  - HLD Zinc, Vit C - supplement        Type of Medication Issue Identified Description of Issue Recommendation(s)  Drug Interaction(s) (clinically significant)        Duplicate Therapy        Allergy        No Medication Administration End Date        Incorrect Dose        Additional Drug Therapy Needed        Significant med changes from prior encounter (inform family/care partners about these prior to discharge).      Other            Clinically significant medication issues were identified that warrant physician communication and completion of prescribed/recommended actions by midnight of the next day:  No   Name of provider notified for urgent issues identified:    Provider Method of Notification:        Pharmacist comments: None   Time spent performing this drug regimen review (minutes):  20 minutes   Thank you. Rocky Slade, PharmD, BCPS 02/27/2024. 7:30 PM

## 2024-02-27 NOTE — Progress Notes (Signed)
Patient unable to answer

## 2024-02-27 NOTE — H&P (Signed)
 Physical Medicine and Rehabilitation Admission H&P        Chief Complaint  Patient presents with   Functional deficits due to CVA   : HPI: Frank Fischer is a 63 year old male with PMHx of hx of multiple prior strokes (R sided, with residual mild L hemiplegia), prostate cancer (s/p brachytherapy, 2018), polysubstance use (marijuana, crack, tobacco, alcohol), HTN, depression, and BPH. Patient presented to [facility] on 02/21/2024 with a left gaze preference and right hemiparesis. In the Emergency Department, his NIHSS score was 25. The patient was last seen normal at 11:30 PM. According to the chart, the patient was found down after going to the bathroom with left gaze and right-sided weakness.  A CT head revealed hypodensity in the right frontal, bilateral, and parietal lobes with a hyperdense left MCA. A CT angiogram of the head and neck showed a left ICA and M1 occlusion. The patient was administered TNK and underwent mechanical thrombectomy successfully. An MRI confirmed a large subacute left MCA infarct with a small petechial hemorrhage. Neurology recommended aspirin  alone due to the large size of the strokes. 2D echo revealed LVEF 60 to 65% with normal function. Hgb A1c 6.1.  UDS positive for THC and cocaine, Ethanol 130 (in ED).    The hospital course was complicated by severe agitation and an elevated CIWAscore, with the patient having a history of daily alcohol use (six-pack per day). Prior to arrival, the patient was independent but did not drive and lived in a one-level home with a level entry with his spouse. He currently requires moderate assistance, +2 for physical assistance, +2 for safety/equipment, Cueing for sequencing for functional mobility.  Needs assist for power up, rise, steady for transfers. Therapy evaluations completed due to patient decreased functional mobility was admitted for a comprehensive rehab program.      Review of Systems  Unable to perform ROS: Mental acuity        Past Medical History:  Diagnosis Date   Arthritis     Cervical stenosis of spine     Chronic headaches     Dental caries      periodontal disease   Frequency of urination     History of cerebral infarction 11-28-2017  per pt never had symptoms    per MRI imaging 10-31-2016 2 small chronic lacunar infartions in the right frontal periventricular white matter , and chronic left medial orbital fracture (as seen 2014 MRI)   Hypertension     Noncompliance with medication regimen     Prostate cancer [facility]) urologist-  dr winter/  oncologist-  dr patrcia    dx 07-21-2017--- Stage T1c, Gleason 4+3,  PSA 13.40,  vol 49.7cc--  scheduled for radioactive seed implants 12-03-2017             Past Surgical History:  Procedure Laterality Date   ABDOMINAL EXPLORATION SURGERY   age 78    per pt had Small Bowel Resection and Appendectomy   APPENDECTOMY       ARM SURGERY Left 1999    REPAIR INJURY-- ARM CUT ABOUT OFF   IR CATHETER TUBE CHANGE   10/28/2021   IR CT HEAD LTD   02/21/2024   IR GUIDED DRAIN W CATHETER PLACEMENT   09/03/2021   IR PERCUTANEOUS ART THROMBECTOMY/INFUSION INTRACRANIAL INC DIAG ANGIO   02/21/2024   IR US  GUIDANCE   09/03/2021   IR US  GUIDE VASC ACCESS RIGHT   02/21/2024   PROSTATE BIOPSY   07/21/2017  dr winter office   RADIOACTIVE SEED IMPLANT N/A 12/03/2017    Procedure: RADIOACTIVE SEED IMPLANT/BRACHYTHERAPY IMPLANT;  Surgeon: Devere Lonni Righter, MD;  Location: [facility];  Service: Urology;  Laterality: N/A;  ONLY NEEDS 120 MIN   RADIOLOGY WITH ANESTHESIA N/A 02/21/2024    Procedure: RADIOLOGY WITH ANESTHESIA;  Surgeon: Radiologist, Medication, MD;  Location: [facility] OR;  Service: Radiology;  Laterality: N/A;   SPACE OAR INSTILLATION N/A 12/03/2017    Procedure: SPACE OAR INSTILLATION;  Surgeon: Devere Lonni Righter, MD;  Location: [facility];  Service: Urology;  Laterality: N/A;   TOOTH EXTRACTION N/A 09/21/2018    Procedure: DENTAL  EXTRACTIONS WITH ALVEOLIPLASTY;  Surgeon: Sheryle Hamilton, DDS;  Location: [facility] OR;  Service: Oral Surgery;  Laterality: N/A;             Family History  Problem Relation Age of Onset   Lung cancer Mother     Hypertension Brother     Diabetes Paternal Grandfather     Prostate cancer Cousin          Social History:  reports that he has been smoking cigarettes. He has a 10 pack-year smoking history. He has never used smokeless tobacco. He reports current alcohol use of about 21.0 standard drinks of alcohol per week. He reports current drug use. Drug: Marijuana. Allergies:  Allergies      Allergies  Allergen Reactions   Penicillins Itching            Medications Prior to Admission  Medication Sig Dispense Refill   albuterol  (VENTOLIN  HFA) 108 (90 Base) MCG/ACT inhaler Inhale 2 puffs into the lungs every 6 (six) hours as needed for wheezing or shortness of breath. 1 each 3   atorvastatin  (LIPITOR ) 80 MG tablet Take 1 tablet (80 mg total) by mouth daily. 90 tablet 1   ezetimibe  (ZETIA ) 10 MG tablet Take 1 tablet (10 mg total) by mouth daily. 90 tablet 3   lisinopril  (ZESTRIL ) 40 MG tablet Take 1 tablet (40 mg total) by mouth every morning. 90 tablet 1   naproxen  (NAPROSYN ) 375 MG tablet Take 1 tablet (375 mg total) by mouth 2 (two) times daily with a meal. 10 tablet 0   triamcinolone  cream (KENALOG ) 0.1 % Apply 1 Application topically 2 (two) times daily. (Patient taking differently: Apply 1 Application topically 2 (two) times daily as needed (skin irritation).) 30 g 0   amLODipine  (NORVASC ) 10 MG tablet Take 1 tablet (10 mg total) by mouth daily. (Patient not taking: Reported on 02/21/2024) 30 tablet 3   aspirin  EC 81 MG tablet Take 81 mg by mouth daily. (Patient not taking: Reported on 02/21/2024)       buPROPion  (WELLBUTRIN  SR) 200 MG 12 hr tablet Take 1 tablet (200 mg total) by mouth 2 (two) times daily. (Patient not taking: Reported on 02/21/2024) 180 tablet 1   tamsulosin  (FLOMAX ) 0.4 MG  CAPS capsule Take 1 capsule (0.4 mg total) by mouth daily. (Patient not taking: Reported on 02/21/2024) 30 capsule 11              Home: Home Living Family/patient expects to be discharged to:: Private residence Living Arrangements: Spouse/significant other Available Help at Discharge: Family Home Access: Level entry Home Layout: One level Bathroom Shower/Tub: Proofreader:  (pt with difficulty reporting) Additional Comments: may need to confirm at later date due to expressive and receptive deficits, unsure of accuracy   Functional History: Prior Function Prior Level of Function :  Independent/Modified Independent Mobility Comments: no AD ADLs Comments: does not drive   Functional Status:  Mobility: Bed Mobility Overal bed mobility: Needs Assistance Bed Mobility: Supine to Sit, Sit to Supine Supine to sit: Min assist Sit to supine: Mod assist, +2 for physical assistance General bed mobility comments: assist for completion of LE translation to EOB, righting balance given R lateral bias. Pt able to scoot self forward with cues. MOd +2 for return to supine for LE lift, trunk lower, and boost up +2. Transfers Overall transfer level: Needs assistance Equipment used: Rolling walker (2 wheels), 1 person hand held assist Transfers: Sit to/from Stand Sit to Stand: Mod assist Bed to/from chair/wheelchair/BSC transfer type:: Step pivot (up the bed) Step pivot transfers: Mod assist, +2 physical assistance General transfer comment: assist for power up, rise, steady. PT standing on pt's R to facilitate rise. stand x4 total during session. Ambulation/Gait Ambulation/Gait assistance: Max assist Gait Distance (Feet): 5 Feet Assistive device: 1 person hand held assist Gait Pattern/deviations: Step-to pattern, Decreased step length - right, Decreased step length - left, Decreased stride length, Decreased weight shift to right, Decreased weight shift to left General Gait  Details: assist to steady at waist, weight shift, RLE progression during swing phase as pt with little to no initiation of RLE lifting. 2x5 ft gait bouts Gait velocity: decr Gait velocity interpretation: <1.31 ft/sec, indicative of household ambulator Pre-gait activities: weight shift L/R at hips, RLE marching in standing with PT facilitating, finding midline with RW for support   ADL: ADL Overall ADL's : Needs assistance/impaired Grooming: Moderate assistance, Sitting, Wash/dry face Grooming Details (indicate cue type and reason): Pt with multimodal cues was able to use LUE to wash behind R ear and face. Upper Body Bathing: Moderate assistance Lower Body Bathing: +2 for physical assistance, +2 for safety/equipment, Maximal assistance Upper Body Dressing : Moderate assistance Lower Body Dressing: Maximal assistance, Bed level Lower Body Dressing Details (indicate cue type and reason): will lift legs off the bed to don socks Toilet Transfer: Moderate assistance, +2 for physical assistance, +2 for safety/equipment Toilet Transfer Details (indicate cue type and reason): simulated through sit<>stand at EOB Toileting- Clothing Manipulation and Hygiene: Maximal assistance, Bed level Functional mobility during ADLs: Moderate assistance, +2 for physical assistance, +2 for safety/equipment, Cueing for sequencing (2 person face to face) General ADL Comments: R inattention, R side deficits, visual deficits?, decreased balance, cognition, activity tolerance   Cognition: Cognition Overall Cognitive Status: Impaired/Different from baseline Arousal/Alertness: Awake/alert Orientation Level: Oriented to person Attention: Sustained Sustained Attention: Impaired Sustained Attention Impairment: Functional basic Awareness: Impaired Awareness Impairment: Intellectual impairment Problem Solving: Impaired Problem Solving Impairment: Functional basic Cognition Arousal: Alert Behavior During Therapy: Flat  affect Overall Cognitive Status: Impaired/Different from baseline   Physical Exam: Blood pressure (!) 127/91, pulse 78, temperature 97.7 F (36.5 C), temperature source Axillary, resp. rate 14, weight 93 kg, SpO2 95%. Physical Exam Constitutional:      Appearance: He is ill-appearing.     Comments: lethargic  HENT:     Head: Normocephalic.     Right Ear: External ear normal.     Left Ear: External ear normal.  Eyes:     Pupils: Pupils are equal, round, and reactive to light.  Cardiovascular:     Rate and Rhythm: Normal rate and regular rhythm.     Heart sounds: No murmur heard.    No gallop.  Pulmonary:     Effort: Pulmonary effort is normal. No respiratory distress.  Breath sounds: No wheezing.  Abdominal:     General: Bowel sounds are normal. There is no distension.     Tenderness: There is no abdominal tenderness.  Musculoskeletal:        General: No swelling or tenderness. Normal range of motion.     Cervical back: Normal range of motion.  Skin:    General: Skin is warm.     Findings: Bruising present.  Neurological:     Comments: Pt is slow to respond. Gaze fixed forward. Pt with significant dysarthria. Word finding deficits. Very distracted. Right central VII. Decreased oromotor control. Was able to tell me his name. Otherwise he was unable to respond or speech was unintelligible for basic questions and commands. He did move all 4 limbs spontaneously. Decreased response to pain on right as opposed to left.   Psychiatric:     Comments: Flat distracted       Lab Results Last 48 Hours        Results for orders placed or performed during the hospital encounter of 02/21/24 (from the past 48 hours)  CBC     Status: None    Collection Time: 02/26/24  4:25 AM  Result Value Ref Range    WBC 6.2 4.0 - 10.5 K/uL    RBC 4.47 4.22 - 5.81 MIL/uL    Hemoglobin 14.2 13.0 - 17.0 g/dL    HCT 57.3 60.9 - 47.9 %    MCV 95.3 80.0 - 100.0 fL    MCH 31.8 26.0 - 34.0 pg    MCHC  33.3 30.0 - 36.0 g/dL    RDW 85.7 88.4 - 84.4 %    Platelets 281 150 - 400 K/uL    nRBC 0.0 0.0 - 0.2 %      Comment: Performed at [facility], [address] [place], [place] [address]  Basic metabolic panel     Status: Abnormal    Collection Time: 02/26/24  4:25 AM  Result Value Ref Range    Sodium 139 135 - 145 mmol/L    Potassium 3.5 3.5 - 5.1 mmol/L    Chloride 104 98 - 111 mmol/L    CO2 22 22 - 32 mmol/L    Glucose, Bld 94 70 - 99 mg/dL      Comment: Glucose reference range applies only to samples taken after fasting for at least 8 hours.    BUN 9 8 - 23 mg/dL    Creatinine, Ser 8.84 0.61 - 1.24 mg/dL    Calcium  8.8 (L) 8.9 - 10.3 mg/dL    GFR, Estimated >39 >39 mL/min      Comment: (NOTE) Calculated using the CKD-EPI Creatinine Equation (2021)      Anion gap 13 5 - 15      Comment: Performed at [facility], [address], [place], [place] [address]  Magnesium     Status: None    Collection Time: 02/26/24  4:25 AM  Result Value Ref Range    Magnesium 2.1 1.7 - 2.4 mg/dL      Comment: Performed at [facility], [address] [place], [place] [address]  Phosphorus     Status: Abnormal    Collection Time: 02/26/24  4:25 AM  Result Value Ref Range    Phosphorus 4.8 (H) 2.5 - 4.6 mg/dL      Comment: Performed at [facility], [address] [place], [place] [address]      Imaging Results (Last  48 hours)  No results found.         Blood pressure (!) 127/91, pulse 78, temperature 97.7 F (36.5 C), temperature source Axillary, resp. rate 14, weight 93 kg, SpO2 95%.   Medical Problem List and Plan: 1. 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             -patient may shower             -ELOS/Goals: 15-20 days, min assist to supervision goals with PT, OT, SLP 2.  Antithrombotics: -DVT/anticoagulation:  Mechanical: Sequential compression devices,  below knee Bilateral lower extremities Pharmaceutical: Lovenox              -antiplatelet therapy: Aspirin  3. Pain Management: Tylenol  prn  4. Mood/Behavior/Sleep: LCSW to follow for evaluation and support when available.              -antipsychotic agents: Wellbutrin              - sleep chart 5. Neuropsych/cognition: This patient is not capable of making decisions on his own behalf.  -hope to see further improvement in arousal/cognition as Pb tapers off.  6. Skin/Wound Care: Routine pressure relief measures 7. Fluids/Electrolytes/Nutrition: Monitor intake and output.  Follow-up chemistries in AM.             - Ensure+ high protein +multivitamin+folic acid               -encourage adequate PO 8.  Hypertension: on home Amlodipine  10 mg and lisinopril  40 mg 9. BPH: Flomax  0.4 mg 10. HLD: LDL 111 goal < 70--Zetia  10 mg and atorvastatin  80 mg 11.  Poststroke dysphagia: SLP consult continue dysphagia 3 12.  Tobacco abuse: nicotine patch 7 mg  13.  Polysubstance abuse: Tox screen positive for cocaine and marijuana. 14.  Acute alcohol withdrawal: History of dependence drinking at least 6 beers daily. On 10/7 pt experienced restlessness/agitation and bradycardia concerning for ETOH withdrawal.              -continue Thiamine   -10/8 placed on phenobarbital taper with agitation improving, continue taper              -10/9 CIWA =9--- was receiving Ativan  1 mg as needed, no longer on. 15.  Hypokalemia:K+replaced with 40 mEq on 10/8. Now 3.5 and stable. Continue to monitor          Daphne LOISE Satterfield, NP 02/27/2024  I have personally performed a face to face diagnostic evaluation of this patient and formulated the key components of the plan.  Additionally, I have personally reviewed laboratory data, imaging studies, as well as relevant notes and concur with the physician assistant's documentation above.  The patient's status has not changed from the original H&P.  Any changes in documentation from  the acute care chart have been noted above.  Arthea IVAR Gunther, MD, LEELLEN

## 2024-02-27 NOTE — TOC Transition Note (Signed)
 Transition of Care (TOC) - Discharge Note Rayfield Gobble RN,BSN Inpatient Care Management Unit 4NP (Non Trauma)- RN Case Manager See Treatment Team for direct Phone #   Patient Details  Name: Frank Fischer MRN: [id number] Date of Birth: 06-11-1960  Transition of Care [facility]) CM/SW Contact:  Gobble Rayfield Hurst, RN Phone Number: 02/27/2024, 12:19 PM   Clinical Narrative:    Notified by Davene CALKINS rehab liaison that pt has bed available for admit today.   Pt is stable for transition to [facility] re[facility] and will transition later today once bed has been assigned.   No further IP CM needs noted.    Final next level of care: IP Rehab Facility Barriers to Discharge: Barriers Resolved   Patient Goals and CMS Choice Patient states their goals for this hospitalization and ongoing recovery are:: return home after rehab   Choice offered to / list presented to : Patient (SO)      Discharge Placement               [facility] re[facility]        Discharge Plan and Services Additional resources added to the After Visit Summary for     Discharge Planning Services: CM Consult Post Acute Care Choice: IP Rehab          DME Arranged: N/A DME Agency: NA       HH Arranged: NA HH Agency: NA        Social Drivers of Health (SDOH) Interventions SDOH Screenings   Food Insecurity: No Food Insecurity (02/21/2024)  Housing: Low Risk  (02/21/2024)  Transportation Needs: No Transportation Needs (09/13/2022)  Utilities: Not At Risk (09/13/2022)  Depression (PHQ2-9): Low Risk  (03/12/2023)  Financial Resource Strain: Medium Risk (09/13/2022)  Social Connections: Unknown (03/04/2019)  Tobacco Use: High Risk (02/21/2024)     Readmission Risk Interventions    02/27/2024   12:19 PM  Readmission Risk Prevention Plan  Transportation Screening Complete  Home Care Screening Complete  Medication Review (RN CM) Complete

## 2024-02-27 NOTE — Progress Notes (Signed)
 Inpatient Rehab Admissions Coordinator:  There is a bed available in CIR for pt today. Dr. Trixie is aware and in agreement. Pt, pt's wife [name], NSG and TOC made aware.   Tinnie Yvone Cohens, MS, CCC-SLP Admissions Coordinator [phone]

## 2024-02-28 ENCOUNTER — Other Ambulatory Visit: Payer: Self-pay

## 2024-02-28 ENCOUNTER — Encounter (HOSPITAL_COMMUNITY): Payer: Self-pay

## 2024-02-28 ENCOUNTER — Encounter (HOSPITAL_COMMUNITY): Payer: Self-pay | Admitting: Internal Medicine

## 2024-02-28 ENCOUNTER — Inpatient Hospital Stay (HOSPITAL_COMMUNITY)
Admission: EM | Admit: 2024-02-28 | Discharge: 2024-03-01 | DRG: 312 | Disposition: A | Discharge status: Rehabilitation Facility | Source: Intra-hospital | Attending: Internal Medicine | Admitting: Internal Medicine

## 2024-02-28 ENCOUNTER — Ambulatory Visit (HOSPITAL_COMMUNITY)

## 2024-02-28 ENCOUNTER — Inpatient Hospital Stay (HOSPITAL_COMMUNITY)

## 2024-02-28 DIAGNOSIS — J449 Chronic obstructive pulmonary disease, unspecified: Secondary | ICD-10-CM | POA: Diagnosis present

## 2024-02-28 DIAGNOSIS — I951 Orthostatic hypotension: Principal | ICD-10-CM | POA: Diagnosis present

## 2024-02-28 DIAGNOSIS — N400 Enlarged prostate without lower urinary tract symptoms: Secondary | ICD-10-CM | POA: Diagnosis present

## 2024-02-28 DIAGNOSIS — I6522 Occlusion and stenosis of left carotid artery: Secondary | ICD-10-CM | POA: Diagnosis present

## 2024-02-28 DIAGNOSIS — Z880 Allergy status to penicillin: Secondary | ICD-10-CM | POA: Diagnosis not present

## 2024-02-28 DIAGNOSIS — Y906 Blood alcohol level of 120-199 mg/100 ml: Secondary | ICD-10-CM | POA: Diagnosis present

## 2024-02-28 DIAGNOSIS — I69391 Dysphagia following cerebral infarction: Secondary | ICD-10-CM | POA: Diagnosis not present

## 2024-02-28 DIAGNOSIS — I10 Essential (primary) hypertension: Secondary | ICD-10-CM | POA: Diagnosis present

## 2024-02-28 DIAGNOSIS — F17210 Nicotine dependence, cigarettes, uncomplicated: Secondary | ICD-10-CM | POA: Diagnosis present

## 2024-02-28 DIAGNOSIS — K029 Dental caries, unspecified: Secondary | ICD-10-CM | POA: Diagnosis present

## 2024-02-28 DIAGNOSIS — Z7982 Long term (current) use of aspirin: Secondary | ICD-10-CM

## 2024-02-28 DIAGNOSIS — I639 Cerebral infarction, unspecified: Secondary | ICD-10-CM | POA: Diagnosis not present

## 2024-02-28 DIAGNOSIS — F1210 Cannabis abuse, uncomplicated: Secondary | ICD-10-CM | POA: Diagnosis present

## 2024-02-28 DIAGNOSIS — R55 Syncope and collapse: Secondary | ICD-10-CM | POA: Insufficient documentation

## 2024-02-28 DIAGNOSIS — Z91148 Patient's other noncompliance with medication regimen for other reason: Secondary | ICD-10-CM | POA: Diagnosis not present

## 2024-02-28 DIAGNOSIS — I959 Hypotension, unspecified: Secondary | ICD-10-CM | POA: Diagnosis not present

## 2024-02-28 DIAGNOSIS — I69354 Hemiplegia and hemiparesis following cerebral infarction affecting left non-dominant side: Secondary | ICD-10-CM | POA: Diagnosis not present

## 2024-02-28 DIAGNOSIS — R233 Spontaneous ecchymoses: Secondary | ICD-10-CM | POA: Diagnosis not present

## 2024-02-28 DIAGNOSIS — Z8042 Family history of malignant neoplasm of prostate: Secondary | ICD-10-CM | POA: Diagnosis not present

## 2024-02-28 DIAGNOSIS — R569 Unspecified convulsions: Secondary | ICD-10-CM | POA: Diagnosis not present

## 2024-02-28 DIAGNOSIS — Z8546 Personal history of malignant neoplasm of prostate: Secondary | ICD-10-CM

## 2024-02-28 DIAGNOSIS — I69351 Hemiplegia and hemiparesis following cerebral infarction affecting right dominant side: Secondary | ICD-10-CM | POA: Diagnosis not present

## 2024-02-28 DIAGNOSIS — Z8249 Family history of ischemic heart disease and other diseases of the circulatory system: Secondary | ICD-10-CM

## 2024-02-28 DIAGNOSIS — I69320 Aphasia following cerebral infarction: Secondary | ICD-10-CM | POA: Diagnosis not present

## 2024-02-28 DIAGNOSIS — E785 Hyperlipidemia, unspecified: Secondary | ICD-10-CM | POA: Diagnosis present

## 2024-02-28 DIAGNOSIS — R29718 NIHSS score 18: Secondary | ICD-10-CM | POA: Diagnosis not present

## 2024-02-28 DIAGNOSIS — Z79899 Other long term (current) drug therapy: Secondary | ICD-10-CM | POA: Diagnosis not present

## 2024-02-28 DIAGNOSIS — I779 Disorder of arteries and arterioles, unspecified: Secondary | ICD-10-CM | POA: Diagnosis not present

## 2024-02-28 DIAGNOSIS — F1010 Alcohol abuse, uncomplicated: Secondary | ICD-10-CM | POA: Diagnosis not present

## 2024-02-28 DIAGNOSIS — I69322 Dysarthria following cerebral infarction: Secondary | ICD-10-CM | POA: Diagnosis not present

## 2024-02-28 DIAGNOSIS — R4189 Other symptoms and signs involving cognitive functions and awareness: Secondary | ICD-10-CM | POA: Diagnosis present

## 2024-02-28 DIAGNOSIS — Z833 Family history of diabetes mellitus: Secondary | ICD-10-CM

## 2024-02-28 DIAGNOSIS — E876 Hypokalemia: Secondary | ICD-10-CM | POA: Diagnosis not present

## 2024-02-28 DIAGNOSIS — K056 Periodontal disease, unspecified: Secondary | ICD-10-CM | POA: Diagnosis present

## 2024-02-28 DIAGNOSIS — F1410 Cocaine abuse, uncomplicated: Secondary | ICD-10-CM | POA: Diagnosis present

## 2024-02-28 DIAGNOSIS — I63512 Cerebral infarction due to unspecified occlusion or stenosis of left middle cerebral artery: Secondary | ICD-10-CM | POA: Diagnosis not present

## 2024-02-28 DIAGNOSIS — M1990 Unspecified osteoarthritis, unspecified site: Secondary | ICD-10-CM | POA: Diagnosis present

## 2024-02-28 DIAGNOSIS — F10139 Alcohol abuse with withdrawal, unspecified: Secondary | ICD-10-CM | POA: Diagnosis present

## 2024-02-28 DIAGNOSIS — F32A Depression, unspecified: Secondary | ICD-10-CM | POA: Diagnosis present

## 2024-02-28 LAB — CBC WITH DIFFERENTIAL/PLATELET
Abs Immature Granulocytes: 0.01 K/uL (ref 0.00–0.07)
Basophils Absolute: 0 K/uL (ref 0.0–0.1)
Basophils Relative: 1 %
Eosinophils Absolute: 0.1 K/uL (ref 0.0–0.5)
Eosinophils Relative: 2 %
HCT: 48.2 % (ref 39.0–52.0)
Hemoglobin: 16.1 g/dL (ref 13.0–17.0)
Immature Granulocytes: 0 %
Lymphocytes Relative: 21 %
Lymphs Abs: 1.2 K/uL (ref 0.7–4.0)
MCH: 31.9 pg (ref 26.0–34.0)
MCHC: 33.4 g/dL (ref 30.0–36.0)
MCV: 95.6 fL (ref 80.0–100.0)
Monocytes Absolute: 0.5 K/uL (ref 0.1–1.0)
Monocytes Relative: 8 %
Neutro Abs: 4 K/uL (ref 1.7–7.7)
Neutrophils Relative %: 68 %
Platelets: 282 K/uL (ref 150–400)
RBC: 5.04 MIL/uL (ref 4.22–5.81)
RDW: 14.2 % (ref 11.5–15.5)
WBC: 5.9 K/uL (ref 4.0–10.5)
nRBC: 0 % (ref 0.0–0.2)

## 2024-02-28 LAB — COMPREHENSIVE METABOLIC PANEL WITH GFR
ALT: 28 U/L (ref 0–44)
AST: 32 U/L (ref 15–41)
Albumin: 3.7 g/dL (ref 3.5–5.0)
Alkaline Phosphatase: 105 U/L (ref 38–126)
Anion gap: 14 (ref 5–15)
BUN: 12 mg/dL (ref 8–23)
CO2: 21 mmol/L — ABNORMAL LOW (ref 22–32)
Calcium: 9.1 mg/dL (ref 8.9–10.3)
Chloride: 105 mmol/L (ref 98–111)
Creatinine, Ser: 1.03 mg/dL (ref 0.61–1.24)
GFR, Estimated: >60 mL/min (ref >60)
Glucose, Bld: 101 mg/dL — ABNORMAL HIGH (ref 70–99)
Potassium: 4 mmol/L (ref 3.5–5.1)
Sodium: 140 mmol/L (ref 135–145)
Total Bilirubin: 0.8 mg/dL (ref 0.0–1.2)
Total Protein: 7.2 g/dL (ref 6.5–8.1)

## 2024-02-28 LAB — GLUCOSE, CAPILLARY: Glucose-Capillary: 101 mg/dL — ABNORMAL HIGH (ref 70–99)

## 2024-02-28 MED ORDER — ADULT MULTIVITAMIN W/MINERALS CH
1.0000 | ORAL_TABLET | Freq: Every day | ORAL | Status: DC
  Administered 2024-02-29 – 2024-03-01 (×2): 1 via ORAL
  Filled 2024-02-28 (×2): qty 1

## 2024-02-28 MED ORDER — FOLIC ACID 1 MG PO TABS
1.0000 mg | ORAL_TABLET | Freq: Every day | ORAL | Status: DC
  Administered 2024-02-29 – 2024-03-01 (×2): 1 mg via ORAL
  Filled 2024-02-28 (×2): qty 1

## 2024-02-28 MED ORDER — EZETIMIBE 10 MG PO TABS
10.0000 mg | ORAL_TABLET | Freq: Every day | ORAL | Status: DC
Start: 2024-02-29 — End: 2024-03-01
  Administered 2024-02-29 – 2024-03-01 (×2): 10 mg via ORAL
  Filled 2024-02-28 (×2): qty 1

## 2024-02-28 MED ORDER — ONDANSETRON HCL 4 MG/2ML IJ SOLN
4.0000 mg | Freq: Four times a day (QID) | INTRAMUSCULAR | Status: DC | PRN
Start: 2024-02-28 — End: 2024-03-01

## 2024-02-28 MED ORDER — ENOXAPARIN SODIUM 40 MG/0.4ML IJ SOSY
40.0000 mg | PREFILLED_SYRINGE | INTRAMUSCULAR | Status: DC
  Administered 2024-02-28 – 2024-03-01 (×3): 40 mg via SUBCUTANEOUS
  Filled 2024-02-28 (×3): qty 0.4

## 2024-02-28 MED ORDER — PHENOBARBITAL 32.4 MG PO TABS
32.4000 mg | ORAL_TABLET | Freq: Two times a day (BID) | ORAL | Status: DC
  Administered 2024-02-28 – 2024-02-29 (×2): 32.4 mg via ORAL
  Filled 2024-02-28 (×2): qty 1

## 2024-02-28 MED ORDER — ACETAMINOPHEN 325 MG PO TABS
650.0000 mg | ORAL_TABLET | Freq: Four times a day (QID) | ORAL | Status: DC | PRN
  Administered 2024-02-29: 650 mg via ORAL
  Filled 2024-02-28: qty 2

## 2024-02-28 MED ORDER — ONDANSETRON HCL 4 MG PO TABS: 4.0000 mg | ORAL_TABLET | Freq: Four times a day (QID) | ORAL | Status: DC | PRN

## 2024-02-28 MED ORDER — SODIUM CHLORIDE 0.9 % IV SOLN: INTRAVENOUS | Status: DC

## 2024-02-28 MED ORDER — TAMSULOSIN HCL 0.4 MG PO CAPS
0.4000 mg | ORAL_CAPSULE | Freq: Every day | ORAL | Status: DC
  Administered 2024-03-01: 0.4 mg via ORAL
  Filled 2024-02-28 (×2): qty 1

## 2024-02-28 MED ORDER — THIAMINE MONONITRATE 100 MG PO TABS
100.0000 mg | ORAL_TABLET | Freq: Every day | ORAL | Status: DC
  Administered 2024-02-29 – 2024-03-01 (×2): 100 mg via ORAL
  Filled 2024-02-28 (×2): qty 1

## 2024-02-28 MED ORDER — NICOTINE 7 MG/24HR TD PT24
7.0000 mg | MEDICATED_PATCH | Freq: Every day | TRANSDERMAL | Status: DC
  Administered 2024-02-29 – 2024-03-01 (×2): 7 mg via TRANSDERMAL
  Filled 2024-02-28 (×2): qty 1

## 2024-02-28 MED ORDER — ACETAMINOPHEN 650 MG RE SUPP: 650.0000 mg | Freq: Four times a day (QID) | RECTAL | Status: DC | PRN

## 2024-02-28 MED ORDER — ATORVASTATIN CALCIUM 80 MG PO TABS
80.0000 mg | ORAL_TABLET | Freq: Every day | ORAL | Status: DC
  Administered 2024-02-29 – 2024-03-01 (×2): 80 mg via ORAL
  Filled 2024-02-28 (×2): qty 1

## 2024-02-28 MED ORDER — ALBUTEROL SULFATE (2.5 MG/3ML) 0.083% IN NEBU: 3.0000 mL | INHALATION_SOLUTION | Freq: Four times a day (QID) | RESPIRATORY_TRACT | Status: DC | PRN

## 2024-02-28 MED ORDER — ASPIRIN 81 MG PO TBEC
81.0000 mg | DELAYED_RELEASE_TABLET | Freq: Every day | ORAL | Status: DC
  Filled 2024-02-28: qty 1

## 2024-02-28 MED ORDER — SODIUM CHLORIDE 0.9 % IV BOLUS
500.0000 mL | Freq: Once | INTRAVENOUS | Status: AC | PRN
  Administered 2024-02-28: 500 mL via INTRAVENOUS

## 2024-02-28 MED ORDER — BUPROPION HCL ER (SR) 100 MG PO TB12
200.0000 mg | ORAL_TABLET | Freq: Two times a day (BID) | ORAL | Status: DC
Start: 2024-02-28 — End: 2024-02-29
  Administered 2024-02-28: 200 mg via ORAL
  Filled 2024-02-28 (×2): qty 2

## 2024-02-28 NOTE — Progress Notes (Signed)
 Occupational Therapy Discharge Summary  Patient Details  Name: Frank Fischer MRN: [id number] Date of Birth: [birth year]  Date of Discharge from OT service:02/28/2024     Patient has met 0 of  long term goals due to medical status.  Patient discharge  to acute care Reasons goals not met: Medical emergency resulting in transfer to acute status  Reasons for discharge: change in medical status  Patient/family agrees OT Discharge Precautions/Restrictions    General PT Missed Treatment Reason: MD hold (Comment) (rapid response called, pt being transferred back to acute) Vital Signs Therapy Vitals Pulse Rate: 75 BP: 104/69 Pain   ADL ADL Eating: Dependent Where Assessed-Eating: Wheelchair Grooming: Setup Where Assessed-Grooming: Sitting at sink Upper Body Bathing: Setup, Minimal assistance Where Assessed-Upper Body Bathing: Sitting at sink Lower Body Bathing: Dependent Where Assessed-Lower Body Bathing: Sitting at sink Upper Body Dressing: Minimal assistance Where Assessed-Upper Body Dressing: Sitting at sink Lower Body Dressing: Dependent Where Assessed-Lower Body Dressing: Sitting at sink Toileting: Dependent Where Assessed-Toileting: Other (Comment) (Based on current funcitonal status with LB task performance) Toilet Transfer: Dependent Toilet Transfer Method: Other (comment) (Stedy x 2) Toilet Transfer Equipment: Other (comment) (Stedy x 2) Tub/Shower Transfer: Moderate assistance (Modx 2 using the stedy) Tub/Shower Transfer Method:  (Didn't occur, patient ModAx 2 using stedy) Vision   Perception    Praxis   Cognition   Sensation   Motor    Mobility     Trunk/Postural Assessment     Balance   Extremity/Trunk Assessment       Frank Fischer 02/28/2024, 5:00 PM

## 2024-02-28 NOTE — H&P (Signed)
 History and Physical    Frank Fischer FMW:[id number] DOB: 1960-10-27 DOA: 02/27/2024  Referring MD/NP/PA: Rehab MD PCP:  Patient coming from:   Chief Complaint: Passed out  HPI: Frank Fischer is a 63 y.o. 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 Subsequently treated with TNK and thrombectomy was transition to progressive care.  Hospitalization complicated by severe alcohol withdrawal, initially treated with Librium followed by phenobarbital, now slowly improving on phenobarbital taper.  He was discharged to CIR yesterday, today when physical therapy tried to get him up he became unresponsive, systolic blood pressure dropped to the 50s/60s range, improved to low 100 after he was put in Trendelenburg position, slowly improved, mental status improving however does have dysarthria from his current stroke  Review of Systems: As per HPI otherwise 14 point review of systems negative.   Past Medical History:  Diagnosis Date   Arthritis    Cervical stenosis of spine    Chronic headaches    Dental caries    periodontal disease   Frequency of urination    History of cerebral infarction 11-28-2017  per pt never had symptoms   per MRI imaging 10-31-2016 2 small chronic lacunar infartions in the right frontal periventricular white matter , and chronic left medial orbital fracture (as seen 2014 MRI)   Hypertension    Noncompliance with medication regimen    Prostate cancer [facility]) urologist-  dr winter/  oncologist-  dr patrcia   dx 07-21-2017--- Stage T1c, Gleason 4+3,  PSA 13.40,  vol 49.7cc--  scheduled for radioactive seed implants 12-03-2017    Past Surgical History:  Procedure Laterality Date   ABDOMINAL EXPLORATION SURGERY  age 62   per pt  had Small Bowel Resection and Appendectomy   APPENDECTOMY     ARM SURGERY Left 1999   REPAIR INJURY-- ARM CUT ABOUT OFF   IR CATHETER TUBE CHANGE  10/28/2021   IR CT HEAD LTD  02/21/2024   IR GUIDED DRAIN W CATHETER PLACEMENT  09/03/2021   IR PERCUTANEOUS ART THROMBECTOMY/INFUSION INTRACRANIAL INC DIAG ANGIO  02/21/2024   IR US  GUIDANCE  09/03/2021   IR US  GUIDE VASC ACCESS RIGHT  02/21/2024   PROSTATE BIOPSY  07/21/2017  dr winter office   RADIOACTIVE SEED IMPLANT N/A 12/03/2017   Procedure: RADIOACTIVE SEED IMPLANT/BRACHYTHERAPY IMPLANT;  Surgeon: Devere Lonni Righter, MD;  Location: [facility];  Service: Urology;  Laterality: N/A;  ONLY NEEDS 120 MIN   RADIOLOGY WITH ANESTHESIA N/A 02/21/2024   Procedure: RADIOLOGY WITH ANESTHESIA;  Surgeon: Radiologist, Medication, MD;  Location: [facility] OR;  Service: Radiology;  Laterality: N/A;   SPACE OAR INSTILLATION N/A 12/03/2017   Procedure: SPACE OAR INSTILLATION;  Surgeon: Devere Lonni Righter, MD;  Location: [facility];  Service: Urology;  Laterality: N/A;   TOOTH EXTRACTION N/A 09/21/2018   Procedure: DENTAL EXTRACTIONS WITH ALVEOLIPLASTY;  Surgeon: Sheryle Hamilton, DDS;  Location: [facility] OR;  Service: Oral Surgery;  Laterality: N/A;     reports that he has been smoking cigarettes. He has a 10 pack-year smoking history. He has never used smokeless tobacco. He reports current alcohol use of about 21.0 standard drinks of alcohol per week. He reports current drug use. Drug: Marijuana.  Allergies  Allergen Reactions   Penicillins Itching    Family History  Problem Relation Age of Onset   Lung cancer Mother    Hypertension Brother    Diabetes Paternal Grandfather    Prostate cancer Cousin      Prior to Admission medications   Medication Sig Start Date End Date Taking? Authorizing Provider  albuterol  (VENTOLIN  HFA) 108 (90 Base) MCG/ACT inhaler Inhale 2 puffs into the lungs every 6 (six) hours as needed for  wheezing or shortness of breath. 07/22/23   Ngetich, Dinah C, NP  amLODipine  (NORVASC ) 10 MG tablet Take 1 tablet (10 mg total) by mouth daily. Patient not taking: Reported on 02/21/2024 07/22/23   Ngetich, Dinah C, NP  aspirin  EC 81 MG tablet Take 81 mg by mouth daily. Patient not taking: Reported on 02/21/2024    [provider]  atorvastatin  (LIPITOR ) 80 MG tablet Take 1 tablet (80 mg total) by mouth daily. 07/22/23   Ngetich, Dinah C, NP  buPROPion  (WELLBUTRIN  SR) 200 MG 12 hr tablet Take 1 tablet (200 mg total) by mouth 2 (two) times daily. Patient not taking: Reported on 02/21/2024 07/22/23   Ngetich, Dinah C, NP  ezetimibe  (ZETIA ) 10 MG tablet Take 1 tablet (10 mg total) by mouth daily. 08/07/23   Ngetich, Dinah C, NP  feeding supplement (ENSURE PLUS HIGH PROTEIN) LIQD Take 237 mLs by mouth 2 (two) times daily between meals. 02/27/24   Gherghe, Costin M, MD  folic acid  (FOLVITE ) 1 MG tablet Take 1 tablet (1 mg total) by mouth daily. 02/28/24   Gherghe, Costin M, MD  lisinopril  (ZESTRIL ) 40 MG tablet Take 1 tablet (40 mg total) by mouth every morning. 07/22/23   Ngetich, Dinah C, NP  Multiple Vitamin (MULTIVITAMIN WITH MINERALS) TABS tablet Take 1 tablet by mouth daily. 02/28/24   Gherghe, Costin M, MD  nicotine (NICODERM CQ - DOSED IN MG/24 HR) 7 mg/24hr patch Place 1 patch (7 mg total) onto the skin daily. 02/28/24   Gherghe, Costin M, MD  PHENobarbital (LUMINAL) 32.4 MG tablet Take 2 tablets (64.8 mg total) by mouth every 8 (eight) hours for 2 days, THEN 1 tablet (32.4 mg total) every 8 (eight) hours for 2 days. 02/27/24 03/02/24  Trixie Nilda HERO, MD  tamsulosin  (FLOMAX ) 0.4 MG CAPS capsule Take 1 capsule (0.4 mg total) by mouth daily. Patient not taking: Reported on 02/21/2024 07/22/23   Ngetich, Dinah C, NP  thiamine  (VITAMIN B-1) 100 MG tablet Take 1 tablet (100 mg total) by mouth daily. 02/28/24   Gherghe, Costin M, MD  triamcinolone  cream (KENALOG ) 0.1 % Apply 1 Application topically 2  (two) times daily. Patient taking differently: Apply 1 Application topically 2 (two) times daily as needed (skin irritation). 07/22/23   Ngetich, Roxan BROCKS, NP    Physical Exam: Vitals:   02/28/24 1252 02/28/24 1255 02/28/24 1300 02/28/24 1310  BP: 91/60 92/63 98/64  91/66  Pulse: 82 75 75 79  Resp:      Temp:      TempSrc:      SpO2: 97% 100% 100%   Weight:      Height:          Constitutional: Chronically ill male, right-sided neglect HEENT:no JVD right ptosis Respiratory: clear to auscultation bilaterally Cardiovascular: S1S2/RRR Abdomen: soft, non tender, Bowel sounds positive.  Musculoskeletal: Right hemiplegia Ext: no edema Skin: no rashes Neurologic: Awake alert, dysarthria noted, right hemiplegia Psychiatric: Affect  Labs on Admission: I have personally reviewed following labs and imaging studies  CBC: Recent Labs  Lab 02/22/24 0510 02/23/24 0646 02/26/24  0425 02/28/24 0648  WBC 7.6 5.9 6.2 5.9  NEUTROABS 4.5 4.1  --  4.0  HGB 13.7 13.6 14.2 16.1  HCT 40.7 39.7 42.6 48.2  MCV 95.1 95.7 95.3 95.6  PLT 245 199 281 282   Basic Metabolic Panel: Recent Labs  Lab 02/22/24 0510 02/23/24 0646 02/26/24 0425 02/28/24 0648  NA 139 136 139 140  K 3.8 3.4* 3.5 4.0  CL 109 104 104 105  CO2 23 21* 22 21*  GLUCOSE 96 99 94 101*  BUN 7* 7* 9 12  CREATININE 1.03 1.04 1.15 1.03  CALCIUM  8.2* 8.3* 8.8* 9.1  MG  --   --  2.1  --   PHOS  --   --  4.8*  --    GFR: Estimated Creatinine Clearance: 74 mL/min (by C-G formula based on SCr of 1.03 mg/dL). Liver Function Tests: Recent Labs  Lab 02/28/24 0648  AST 32  ALT 28  ALKPHOS 105  BILITOT 0.8  PROT 7.2  ALBUMIN 3.7   No results for input(s): LIPASE, AMYLASE in the last 168 hours. No results for input(s): AMMONIA in the last 168 hours. Coagulation Profile: No results for input(s): INR, PROTIME in the last 168 hours. Cardiac Enzymes: No results for input(s): CKTOTAL, CKMB, CKMBINDEX,  TROPONINI in the last 168 hours. BNP (last 3 results) No results for input(s): PROBNP in the last 8760 hours. HbA1C: No results for input(s): HGBA1C in the last 72 hours. CBG: Recent Labs  Lab 02/22/24 1548 02/22/24 2016 02/28/24 1205  GLUCAP 111* 113* 101*   Lipid Profile: No results for input(s): CHOL, HDL, LDLCALC, TRIG, CHOLHDL, LDLDIRECT in the last 72 hours. Thyroid Function Tests: No results for input(s): TSH, T4TOTAL, FREET4, T3FREE, THYROIDAB in the last 72 hours. Anemia Panel: No results for input(s): VITAMINB12, FOLATE, FERRITIN, TIBC, IRON, RETICCTPCT in the last 72 hours. Urine analysis:    Component Value Date/Time   COLORURINE YELLOW 01/10/2024 1046   APPEARANCEUR HAZY (A) 01/10/2024 1046   LABSPEC 1.015 01/10/2024 1046   PHURINE 5.0 01/10/2024 1046   GLUCOSEU NEGATIVE 01/10/2024 1046   HGBUR MODERATE (A) 01/10/2024 1046   BILIRUBINUR NEGATIVE 01/10/2024 1046   BILIRUBINUR negative 07/31/2022 0000   KETONESUR NEGATIVE 01/10/2024 1046   PROTEINUR NEGATIVE 01/10/2024 1046   UROBILINOGEN 0.2 07/31/2022 0000   UROBILINOGEN 0.2 04/30/2013 1111   NITRITE POSITIVE (A) 01/10/2024 1046   LEUKOCYTESUR LARGE (A) 01/10/2024 1046   Sepsis Labs: @LABRCNTIP (procalcitonin:4,lacticidven:4) ) Recent Results (from the past 240 hours)  Resp panel by RT-PCR (RSV, Flu A&B, Covid) Anterior Nasal Swab     Status: None   Collection Time: 02/21/24  1:11 AM   Specimen: Anterior Nasal Swab  Result Value Ref Range Status   SARS Coronavirus 2 by RT PCR NEGATIVE NEGATIVE Final   Influenza A by PCR NEGATIVE NEGATIVE Final   Influenza B by PCR NEGATIVE NEGATIVE Final    Comment: (NOTE) The Xpert Xpress SARS-CoV-2/FLU/RSV plus assay is intended as an aid in the diagnosis of influenza from Nasopharyngeal swab specimens and should not be used as a sole basis for treatment. Nasal washings and aspirates are unacceptable for Xpert Xpress  SARS-CoV-2/FLU/RSV testing.  Fact Sheet for Patients: BloggerCourse.com  Fact Sheet for Healthcare Providers: SeriousBroker.it  This test is not yet approved or cleared by the [place]  FDA and has been authorized for detection and/or diagnosis of SARS-CoV-2 by FDA under an Emergency Use Authorization (EUA). This EUA will remain in effect (meaning this test can be  used) for the duration of the COVID-19 declaration under Section 564(b)(1) of the Act, 21 U.S.C. section 360bbb-3(b)(1), unless the authorization is terminated or revoked.     Resp Syncytial Virus by PCR NEGATIVE NEGATIVE Final    Comment: (NOTE) Fact Sheet for Patients: BloggerCourse.com  Fact Sheet for Healthcare Providers: SeriousBroker.it  This test is not yet approved or cleared by the [place]  FDA and has been authorized for detection and/or diagnosis of SARS-CoV-2 by FDA under an Emergency Use Authorization (EUA). This EUA will remain in effect (meaning this test can be used) for the duration of the COVID-19 declaration under Section 564(b)(1) of the Act, 21 U.S.C. section 360bbb-3(b)(1), unless the authorization is terminated or revoked.  Performed at [facility], [address], [place], [place] [address]   MRSA Next Gen by PCR, Nasal     Status: None   Collection Time: 02/21/24  6:14 AM   Specimen: Nasal Swab  Result Value Ref Range Status   MRSA by PCR Next Gen NOT DETECTED NOT DETECTED Final    Comment: (NOTE) The GeneXpert MRSA Assay (FDA approved for NASAL specimens only), is one component of a comprehensive MRSA colonization surveillance program. It is not intended to diagnose MRSA infection nor to guide or monitor treatment for MRSA infections. Test performance is not FDA approved in patients less than 53 years old. Performed at [facility], [address] [place],  [place] [address]      Radiological Exams on Admission: No results found.  EKG: Ordered and pending  Assessment/Plan     Syncope -Suspect syncopal event related to orthostatic hypotension - Neurology consulting, also recommended EEG to rule out seizures - He is on a phenobarbital taper for EtOH withdrawal, does not appear to be acutely withdrawing at this time,  day 8 of hospitalization today - Continue gentle IV fluids for today, hold amlodipine  and lisinopril  - Suspect he could have autonomic neuropathy from longstanding EtOH abuse - Repeat orthostatics tomorrow  Left acute MCA stroke -With right hemiplegia, dysarthria, aphasia - Treated with mechanical thrombectomy and tPA for large vessel stroke - MRI noted large left MCA infarct - Echo noted preserved EF, LDL 111, A1c 6.1  Alcohol and polysubstance abuse - EtOH level was 130 on admission, positive for cocaine and cannabis as well -Per spouse drinks 6-8 beers per day - Counseled, continue thiamine , multivitamins, complete phenobarbital taper  COPD Tobacco abuse - Counseled, albuterol  as needed  Hypertension - Now with syncope and orthostatic hypotension holding lisinopril  and amlodipine  today  BPH Continue Flomax   Depression Continue bupropion   DVT prophylaxis: Lovenox  Code Status: Full code Family Communication: Discussed with wife at bedside Disposition Plan: inpt Admission status: inpt  Sigurd Pac MD [facility]   02/28/2024, 1:33 PM

## 2024-02-28 NOTE — Consult Note (Signed)
 Stroke Neurology Consultation Note  Consult Requested by: Dr. Clorinda   Reason for Consult: AMS  Consult Date: 02/28/24   The history was obtained from the CIR service and chart.  During history and examination, all items were able to obtain unless otherwise noted.  History of Present Illness:  Frank Fischer is a 63 y.o. African American male with PMH of prior strokes, prostate cancer, polysubstance use (marijuana, crack, tobacco, alcohol), HTN, depression, BPH who was just admitted to CIR yesterday for acute stroke rehab.   Patient was admitted on 02/21/2024 for left MCA infarct with left internal carotid and M1 occlusion status post TNK and IR with TICI3 reperfusion.  CT head showed acute/early subacute left MCA territory infarct with hyperdense left MCA and ASPECTS 8.  CTA head and neck showed left MCA occlusion, left ICA narrowing at the P3 segment region occlusion at the distal cavernous segment and reconstituted at supraclinoid portion.  Moderate stenosis right M1, mild stenosis bilateral PCA.  Post IR MRI showed large subacute left MCA infarct with small petechial hemorrhage.  MRA showed left ICA still occluded but left MCA patent.  EF 60 to 65%, LDL 111, A1c 6.1.  UDS positive for cocaine and THC.  Hospitalization complicated by severe alcohol withdrawal, initially treated with Librium followed by phenobarbital, now slowly improving on phenobarbital taper. Patient was discharged to CIR yesterday on aspirin  81 and Lipitor  80 and Zetia  10.  Today patient was working with PT and OT, try to get him up standing, he became unresponsive, loss of consciousness.  BP at that time 50 to 60s.  Put him back in bed and patient BP improved to 80s and then 100s, patient slowly improved with mental status and muscle strength however weaker than baseline.  CT head stat ordered as well as EEG ordered.  Patient transferred back to acute hospital for evaluation.  Currently, patient mental status much improved,  near baseline now.  EEG running.  Patient had stroke in 02/2019 with multifocal right brain infarct in the setting of severe right ICA stenosis and cocaine use.  MRI neck unremarkable.  CTA head and neck showed severe right ICA siphon and left P2 stenosis.  EF 50 to 55%, LDL 128, A1c 5.8.  Patient discharged on DAPT for 3 months as well as Lipitor  80.   Past Medical History:  Diagnosis Date   Arthritis    Cervical stenosis of spine    Chronic headaches    Dental caries    periodontal disease   Frequency of urination    History of cerebral infarction 11-28-2017  per pt never had symptoms   per MRI imaging 10-31-2016 2 small chronic lacunar infartions in the right frontal periventricular white matter , and chronic left medial orbital fracture (as seen 2014 MRI)   Hypertension    Noncompliance with medication regimen    Prostate cancer [facility]) urologist-  dr winter/  oncologist-  dr patrcia   dx 07-21-2017--- Stage T1c, Gleason 4+3,  PSA 13.40,  vol 49.7cc--  scheduled for radioactive seed implants 12-03-2017    Past Surgical History:  Procedure Laterality Date   ABDOMINAL EXPLORATION SURGERY  age 3   per pt had Small Bowel Resection and Appendectomy   APPENDECTOMY     ARM SURGERY Left 1999   REPAIR INJURY-- ARM CUT ABOUT OFF   IR CATHETER TUBE CHANGE  10/28/2021   IR CT HEAD LTD  02/21/2024   IR GUIDED DRAIN W CATHETER PLACEMENT  09/03/2021   IR  PERCUTANEOUS ART THROMBECTOMY/INFUSION INTRACRANIAL INC DIAG ANGIO  02/21/2024   IR US  GUIDANCE  09/03/2021   IR US  GUIDE VASC ACCESS RIGHT  02/21/2024   PROSTATE BIOPSY  07/21/2017  dr winter office   RADIOACTIVE SEED IMPLANT N/A 12/03/2017   Procedure: RADIOACTIVE SEED IMPLANT/BRACHYTHERAPY IMPLANT;  Surgeon: Devere Lonni Righter, MD;  Location: [facility];  Service: Urology;  Laterality: N/A;  ONLY NEEDS 120 MIN   RADIOLOGY WITH ANESTHESIA N/A 02/21/2024   Procedure: RADIOLOGY WITH ANESTHESIA;  Surgeon: Radiologist,  Medication, MD;  Location: [facility] OR;  Service: Radiology;  Laterality: N/A;   SPACE OAR INSTILLATION N/A 12/03/2017   Procedure: SPACE OAR INSTILLATION;  Surgeon: Devere Lonni Righter, MD;  Location: [facility];  Service: Urology;  Laterality: N/A;   TOOTH EXTRACTION N/A 09/21/2018   Procedure: DENTAL EXTRACTIONS WITH ALVEOLIPLASTY;  Surgeon: Sheryle Hamilton, DDS;  Location: [facility] OR;  Service: Oral Surgery;  Laterality: N/A;    Family History  Problem Relation Age of Onset   Lung cancer Mother    Hypertension Brother    Diabetes Paternal Grandfather    Prostate cancer Cousin     Social History:  reports that he has been smoking cigarettes. He has a 10 pack-year smoking history. He has never used smokeless tobacco. He reports current alcohol use of about 21.0 standard drinks of alcohol per week. He reports current drug use. Drug: Marijuana.  Allergies:  Allergies  Allergen Reactions   Penicillins Itching    No current facility-administered medications on file prior to encounter.   Current Outpatient Medications on File Prior to Encounter  Medication Sig Dispense Refill   albuterol  (VENTOLIN  HFA) 108 (90 Base) MCG/ACT inhaler Inhale 2 puffs into the lungs every 6 (six) hours as needed for wheezing or shortness of breath. 1 each 3   amLODipine  (NORVASC ) 10 MG tablet Take 1 tablet (10 mg total) by mouth daily. (Patient not taking: Reported on 02/21/2024) 30 tablet 3   aspirin  EC 81 MG tablet Take 81 mg by mouth daily. (Patient not taking: Reported on 02/21/2024)     atorvastatin  (LIPITOR ) 80 MG tablet Take 1 tablet (80 mg total) by mouth daily. 90 tablet 1   buPROPion  (WELLBUTRIN  SR) 200 MG 12 hr tablet Take 1 tablet (200 mg total) by mouth 2 (two) times daily. (Patient not taking: Reported on 02/21/2024) 180 tablet 1   ezetimibe  (ZETIA ) 10 MG tablet Take 1 tablet (10 mg total) by mouth daily. 90 tablet 3   feeding supplement (ENSURE PLUS HIGH PROTEIN) LIQD Take 237 mLs by mouth  2 (two) times daily between meals.     folic acid  (FOLVITE ) 1 MG tablet Take 1 tablet (1 mg total) by mouth daily.     lisinopril  (ZESTRIL ) 40 MG tablet Take 1 tablet (40 mg total) by mouth every morning. 90 tablet 1   Multiple Vitamin (MULTIVITAMIN WITH MINERALS) TABS tablet Take 1 tablet by mouth daily.     nicotine (NICODERM CQ - DOSED IN MG/24 HR) 7 mg/24hr patch Place 1 patch (7 mg total) onto the skin daily.     PHENobarbital (LUMINAL) 32.4 MG tablet Take 2 tablets (64.8 mg total) by mouth every 8 (eight) hours for 2 days, THEN 1 tablet (32.4 mg total) every 8 (eight) hours for 2 days.     tamsulosin  (FLOMAX ) 0.4 MG CAPS capsule Take 1 capsule (0.4 mg total) by mouth daily. (Patient not taking: Reported on 02/21/2024) 30 capsule 11   thiamine  (VITAMIN B-1) 100  MG tablet Take 1 tablet (100 mg total) by mouth daily.     triamcinolone  cream (KENALOG ) 0.1 % Apply 1 Application topically 2 (two) times daily. (Patient taking differently: Apply 1 Application topically 2 (two) times daily as needed (skin irritation).) 30 g 0    Review of Systems: A full ROS was attempted today and was able to be performed.  Systems assessed include - Constitutional, Eyes, HENT, Respiratory, Cardiovascular, Gastrointestinal, Genitourinary, Integument/breast, Hematologic/lymphatic, Musculoskeletal, Neurological, Behavioral/Psych, Endocrine, Allergic/Immunologic - with pertinent responses as per HPI.  Physical Examination: Temp:  [97.6 F (36.4 C)-98.3 F (36.8 C)] 97.7 F (36.5 C) (10/11 1502) Pulse Rate:  [61-85] 75 (10/11 1502) Resp:  [16-24] 19 (10/11 1502) BP: (73-142)/(54-85) 127/76 (10/11 1502) SpO2:  [93 %-100 %] 97 % (10/11 1502) Weight:  [83.8 kg-85.5 kg] 83.8 kg (10/11 0559)  General - well nourished, well developed, in no apparent distress.    Ophthalmologic - fundi not visualized due to noncooperation.    Cardiovascular - regular rhythm and rate  Neuro - awake, alert, eyes open, expressive  aphasia with mumbling words but not meaningful. Not able to answer orientation questions and not able to name or repeat. Able to follow midline commands but not peripheral commands. Able to track bilaterally with voice but still has left gaze preference, blinking to visual threat on the left but not on the right. Mild R facial droop. Tongue midline. LUE no drift but RUE drift to bed within 10 sec. BLE 3/5 proximal, distal not cooperative. Sensation, coordination and gait not tested.    Data Reviewed: CT HEAD WO CONTRAST ( ) Result Date: 02/28/2024 EXAM: CT HEAD WITHOUT CONTRAST 02/28/2024 02:53:24 PM TECHNIQUE: CT of the head was performed without the administration of intravenous contrast. Automated exposure control, iterative reconstruction, and/or weight based adjustment of the mA/kV was utilized to reduce the radiation dose to as low as reasonably achievable. COMPARISON: MRI head without contrast 02/22/2024. CLINICAL HISTORY: Stroke, follow up. FINDINGS: BRAIN AND VENTRICLES: Expected evolution of the large left MCA territory infarct is noted. No acute hemorrhage is present. Remote infarcts involving the posterior right frontal lobe, right parietal lobe, and right occipital lobe are again noted. Remote lacunar infarcts of the right basal ganglia are stable. Atherosclerotic calcifications are present in the cavernous carotid arteries bilaterally. No hyperdense vessel is present. No evidence of acute infarct. No hydrocephalus. No extra-axial collection. No mass effect or midline shift. ORBITS: No acute abnormality. SINUSES: No acute abnormality. SOFT TISSUES AND SKULL: No acute soft tissue abnormality. No skull fracture. IMPRESSION: 1. No acute intracranial abnormality. 2. Expected evolution of the large left MCA territory infarct. 3. Remote infarcts involving the posterior right frontal lobe, right parietal lobe, and right occipital lobe. 4. Stable remote lacunar infarcts of the right basal ganglia. 5.  Atherosclerotic calcifications in the cavernous carotid arteries bilaterally. No hyperdense vessel. Electronically signed by: Lonni Necessary MD 02/28/2024 04:00 PM EDT RP Workstation: [id number]   MR BRAIN WO CONTRAST Result Date: 02/22/2024 EXAM: MRI BRAIN WITHOUT CONTRAST MRA HEAD WITHOUT CONTRAST 02/22/2024 12:35:18 AM TECHNIQUE: Multiplanar multisequence MRI of the brain was performed without the administration of intravenous contrast. MRA of the head was performed without contrast using time-of-flight technique. 3D postprocessing with multiplanar reformations and MIPs was performed for better evaluation of the vasculature. COMPARISON: 02/26/2019 CLINICAL HISTORY: Stroke, follow up. FINDINGS: MRI BRAIN: BRAIN AND VENTRICLES: Large early subacute infarct of the left MCA territory predominantly affecting the posterior region. Small amount of petechial hemorrhage in the infarcted  region. Multifocal hyperintense T2-weighted signal within the cerebral white matter, most commonly due to chronic small vessel disease. Multiple old infarcts of the right MCA and PCA territories. Abnormal left ICA flow void. No mass or abnormal enhancement. No midline shift. No hydrocephalus. The sella is unremarkable. Normal flow voids. ORBITS: No acute abnormality. SINUSES AND MASTOIDS: No acute abnormality. BONES AND SOFT TISSUES: Normal bone marrow signal. No acute soft tissue abnormality. MRA HEAD: ANTERIOR CIRCULATION: There is no flow related enhancement within the left ICA on the time of flight angiographic images. No significant stenosis of the right internal carotid artery. There is normal flow related enhancement throughout the left MCA. No significant stenosis of the right middle cerebral artery. No significant stenosis of the anterior cerebral arteries. No aneurysm. POSTERIOR CIRCULATION: Unchanged severe stenosis of the right PCA P2 segment. No significant stenosis of the left posterior cerebral artery. No significant  stenosis of the basilar artery. No significant stenosis of the vertebral arteries. No aneurysm. IMPRESSION: 1. Large early subacute left MCA territory infarct, predominantly posterior, with small petechial hemorrhage (Heidelberg class 1). 2. Left ICA occlusion.  Left MCA is now patent. 3. Multiple chronic infarcts in the right MCA and PCA territories. 4. 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 Electronically signed by: Franky Stanford MD 02/22/2024 12:52 AM EDT RP Workstation: [id number]   MR ANGIO HEAD WO CONTRAST Result Date: 02/22/2024 EXAM: MRI BRAIN WITHOUT CONTRAST MRA HEAD WITHOUT CONTRAST 02/22/2024 12:35:18 AM TECHNIQUE: Multiplanar multisequence MRI of the brain was performed without the administration of intravenous contrast. MRA of the head was performed without contrast using time-of-flight technique. 3D postprocessing with multiplanar reformations and MIPs was performed for better evaluation of the vasculature. COMPARISON: 02/26/2019 CLINICAL HISTORY: Stroke, follow up. FINDINGS: MRI BRAIN: BRAIN AND VENTRICLES: Large early subacute infarct of the left MCA territory predominantly affecting the posterior region. Small amount of petechial hemorrhage in the infarcted region. Multifocal hyperintense T2-weighted signal within the cerebral white matter, most commonly due to chronic small vessel disease. Multiple old infarcts of the right MCA and PCA territories. Abnormal left ICA flow void. No mass or abnormal enhancement. No midline shift. No hydrocephalus. The sella is unremarkable. Normal flow voids. ORBITS: No acute abnormality. SINUSES AND MASTOIDS: No acute abnormality. BONES AND SOFT TISSUES: Normal bone marrow signal. No acute soft tissue abnormality. MRA HEAD: ANTERIOR CIRCULATION: There is no flow related enhancement within the left ICA on the time of flight angiographic images. No significant stenosis of the right internal carotid artery. There is normal flow related enhancement  throughout the left MCA. No significant stenosis of the right middle cerebral artery. No significant stenosis of the anterior cerebral arteries. No aneurysm. POSTERIOR CIRCULATION: Unchanged severe stenosis of the right PCA P2 segment. No significant stenosis of the left posterior cerebral artery. No significant stenosis of the basilar artery. No significant stenosis of the vertebral arteries. No aneurysm. IMPRESSION: 1. Large early subacute left MCA territory infarct, predominantly posterior, with small petechial hemorrhage (Heidelberg class 1). 2. Left ICA occlusion.  Left MCA is now patent. 3. Multiple chronic infarcts in the right MCA and PCA territories. 4. 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 Electronically signed by: Franky Stanford MD 02/22/2024 12:52 AM EDT RP Workstation: [id number]   ECHOCARDIOGRAM COMPLETE Result Date: 02/21/2024    ECHOCARDIOGRAM REPORT   Patient Name:   Frank Fischer Date of Exam: 02/21/2024 Medical Rec #:  [id number]      Height:  70.0 in Accession #:    [id number]     Weight:       205.0 lb Date of Birth:  Jan 02, 1961     BSA:          2.109 m Patient Age:    62 years       BP:           150/74 mmHg Patient Gender: M              HR:           50 bpm. Exam Location:  Inpatient Procedure: 2D Echo, 3D Echo, Color Doppler and Cardiac Doppler (Both Spectral            and Color Flow Doppler were utilized during procedure). Indications:    Stroke I63.9  History:        Patient has prior history of Echocardiogram examinations, most                 recent 02/26/2019. Stroke, Arrythmias:Bradycardia; Risk                 Factors:Hypertension, Current Smoker and PSA.  Sonographer:    Koleen Popper RDCS Referring Phys: [id number] NORMIE M BUI IMPRESSIONS  1. Left ventricular ejection fraction, by estimation, is 60 to 65%. Left ventricular ejection fraction by 3D volume is 52 %. The left ventricle has normal function. The left ventricle has no regional wall motion abnormalities.  Left ventricular diastolic  parameters were normal.  2. Right ventricular systolic function is normal. The right ventricular size is normal. Tricuspid regurgitation signal is inadequate for assessing PA pressure.  3. The mitral valve is normal in structure. No evidence of mitral valve regurgitation. No evidence of mitral stenosis.  4. The aortic valve was not well visualized. Aortic valve regurgitation is not visualized. No aortic stenosis is present.  5. The inferior vena cava is normal in size with greater than 50% respiratory variability, suggesting right atrial pressure of 3 mmHg.  6. Increased flow velocities may be secondary to anemia, thyrotoxicosis, hyperdynamic or high flow state. Comparison(s): No significant change from prior study. FINDINGS  Left Ventricle: Left ventricular ejection fraction, by estimation, is 60 to 65%. Left ventricular ejection fraction by 3D volume is 52 %. The left ventricle has normal function. The left ventricle has no regional wall motion abnormalities. Strain was performed and the global longitudinal strain is indeterminate. The left ventricular internal cavity size was normal in size. There is no left ventricular hypertrophy. Left ventricular diastolic parameters were normal. Right Ventricle: The right ventricular size is normal. No increase in right ventricular wall thickness. Right ventricular systolic function is normal. Tricuspid regurgitation signal is inadequate for assessing PA pressure. Left Atrium: Left atrial size was normal in size. Right Atrium: Right atrial size was normal in size. Pericardium: There is no evidence of pericardial effusion. Mitral Valve: The mitral valve is normal in structure. No evidence of mitral valve regurgitation. No evidence of mitral valve stenosis. Tricuspid Valve: The tricuspid valve is normal in structure. Tricuspid valve regurgitation is not demonstrated. No evidence of tricuspid stenosis. Aortic Valve: The aortic valve was not well  visualized. Aortic valve regurgitation is not visualized. No aortic stenosis is present. Aortic valve mean gradient measures 6.0 mmHg. Aortic valve peak gradient measures 13.5 mmHg. Aortic valve area, by VTI measures 2.53 cm. Pulmonic Valve: The pulmonic valve was normal in structure. Pulmonic valve regurgitation is not visualized. No evidence of pulmonic stenosis. Aorta: The aortic  root is normal in size and structure. Venous: The inferior vena cava is normal in size with greater than 50% respiratory variability, suggesting right atrial pressure of 3 mmHg. IAS/Shunts: No atrial level shunt detected by color flow Doppler. Additional Comments: 3D was performed not requiring image post processing on an independent workstation and was indeterminate.  LEFT VENTRICLE PLAX 2D LVIDd:         4.50 cm         Diastology LVIDs:         3.24 cm         LV e' medial:    11.60 cm/s LV PW:         0.85 cm         LV E/e' medial:  8.8 LV IVS:        1.09 cm         LV e' lateral:   13.50 cm/s LVOT diam:     1.81 cm         LV E/e' lateral: 7.6 LV SV:         80 LV SV Index:   38 LVOT Area:     2.57 cm        3D Volume EF                                LV 3D EF:    Left                                             ventricul LV Volumes (MOD)                            ar LV vol d, MOD    132.0 ml                   ejection A4C:                                        fraction LV vol s, MOD    51.6 ml                    by 3D A4C:                                        volume is LV SV MOD A4C:   132.0 ml                   52 %.                                 3D Volume EF:                                3D EF:        52 %  LV EDV:       144 ml                                LV ESV:       69 ml                                LV SV:        75 ml RIGHT VENTRICLE             IVC RV Basal diam:  3.98 cm     IVC diam: 1.92 cm RV S prime:     18.20 cm/s TAPSE (M-mode): 2.4 cm LEFT ATRIUM             Index         RIGHT ATRIUM           Index LA diam:        2.86 cm 1.36 cm/m   RA Area:     12.60 cm LA Vol (A2C):   55.2 ml 26.17 ml/m  RA Volume:   26.90 ml  12.75 ml/m LA Vol (A4C):   26.8 ml 12.70 ml/m LA Biplane Vol: 37.8 ml 17.92 ml/m  AORTIC VALVE AV Area (Vmax):    2.28 cm AV Area (Vmean):   2.22 cm AV Area (VTI):     2.53 cm AV Vmax:           184.00 cm/s AV Vmean:          102.000 cm/s AV VTI:            0.317 m AV Peak Grad:      13.5 mmHg AV Mean Grad:      6.0 mmHg LVOT Vmax:         163.00 cm/s LVOT Vmean:        88.100 cm/s LVOT VTI:          0.312 m LVOT/AV VTI ratio: 0.98  AORTA Ao Root diam: 2.37 cm MITRAL VALVE MV Area (PHT): 3.42 cm     SHUNTS MV Decel Time: 222 msec     Systemic VTI:  0.31 m MV E velocity: 102.00 cm/s  Systemic Diam: 1.81 cm MV A velocity: 66.60 cm/s MV E/A ratio:  1.53 Vishnu Priya Mallipeddi Electronically signed by Diannah Late Mallipeddi Signature Date/Time: 02/21/2024/4:42:33 PM    Final    IR PERCUTANEOUS ART THROMBECTOMY/INFUSION INTRACRANIAL INC DIAG ANGIO Result Date: 02/21/2024 PROCEDURE PERFORMED: 1. Cerebral angiography with stroke thrombectomy 2. Ultrasound guided vascular access 3. Cone beam CT for treatment planning COMPARISON:  CT angiogram of the head and neck performed February 21, 2024 CLINICAL DATA:  63 year old male with acute ischemic stroke and symptoms of right-sided plegia, aphasia, and gaze preference. NIH stroke scale measures 25. Patient was a candidate for thrombolysis and also mechanical thrombectomy and presented to interventional radiology for treatment. INDICATION: Acute ischemic stroke. ANESTHESIA/SEDATION: General anesthesia was utilized for the procedure. CONTRAST:  Approximately 50 cc Ominipque 300 MEDICATIONS: See MAR FLUOROSCOPY TIME:  Fluoroscopy Time: 12 minutes, (628 mGy). COMPLICATIONS: None immediate. BODY OF REPORT: Following a full explanation of the procedure along with the potential associated complications, an informed witnessed  consent was obtained. The patient was then placed under general anesthesia by the Department of Anesthesiology at [facility]. The right femoral access site was prepped and draped in the  usual sterile fashion. Ultrasound was used to study the right common femoral artery which was patent. Using real-time ultrasound guidance, a 19 gauge introducer needle was used to access the right common femoral artery. Access was performed at 01:49. A hard copy image ultrasound the saved and stored in PACS. Using this access, a 6 French sheath was placed in the descending thoracic aorta. Next, selective catheterization the left internal carotid artery was performed. 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 Pretreatment TICI score 0. Next, red 72 was advanced into the distal left ICA. The first pass was performed at 0203. This resulted in partial extraction of thrombus from the carotid terminus. Next, selective catheterization of the left M1 segment was performed and an EMBO trap was deployed within the left M1 segment and distal ICA. This resulted in partial extraction of the left M1 thrombus and the residual thrombus within the distal left ICA. This pass was performed at 02:15. Finally, a third pass was performed using a penumbra 43 catheter into a proximal left M2 branch occlusion which successfully yielded thrombus. This pass was performed at 02:25. There was no significant residual thromboembolic disease. Mild plaque was present within the distal left intracranial ICA. A mild stenosis is also present in the proximal left ICA origin which was estimated at less than 50% based on NASCET criteria. Post treatment TICI score 3. After reviewing the imaging, I elected to terminate the procedure at this point. Evaluation of the right femoral access site demonstrated that the site was suitable for a closure device. A 6 French Angio-Seal device  was deployed without complication. Cone beam CT was then performed to evaluate for intracranial hemorrhage and treatment planning. This demonstrated minimal contrast staining. The patient was removed from anesthesia and transferred to recovery in stable condition. IMPRESSION: 1. Suction and stent retriever assisted thrombectomy of left carotid terminus and M1 occlusion. 2. Post-treatment TICI score = 3. PLAN: 1. To ICU for routine postoperative supportive care. Electronically Signed   By: Maude Naegeli M.D.   On: 02/21/2024 09:15   IR US  Guide Vasc Access Right Result Date: 02/21/2024 PROCEDURE PERFORMED: 1. Cerebral angiography with stroke thrombectomy 2. Ultrasound guided vascular access 3. Cone beam CT for treatment planning COMPARISON:  CT angiogram of the head and neck performed February 21, 2024 CLINICAL DATA:  63 year old male with acute ischemic stroke and symptoms of right-sided plegia, aphasia, and gaze preference. NIH stroke scale measures 25. Patient was a candidate for thrombolysis and also mechanical thrombectomy and presented to interventional radiology for treatment. INDICATION: Acute ischemic stroke. ANESTHESIA/SEDATION: General anesthesia was utilized for the procedure. CONTRAST:  Approximately 50 cc Ominipque 300 MEDICATIONS: See MAR FLUOROSCOPY TIME:  Fluoroscopy Time: 12 minutes, (628 mGy). COMPLICATIONS: None immediate. BODY OF REPORT: Following a full explanation of the procedure along with the potential associated complications, an informed witnessed consent was obtained. The patient was then placed under general anesthesia by the Department of Anesthesiology at [facility]. The right femoral access site was prepped and draped in the usual sterile fashion. Ultrasound was used to study the right common femoral artery which was patent. Using real-time ultrasound guidance, a 19 gauge introducer needle was used to access the right common femoral artery. Access was performed at 01:49. A  hard copy image ultrasound the saved and stored in PACS. Using this access, a 6 French sheath was placed in the descending thoracic aorta. Next, selective catheterization the left  internal carotid artery was performed. 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 Pretreatment TICI score 0. Next, red 72 was advanced into the distal left ICA. The first pass was performed at 0203. This resulted in partial extraction of thrombus from the carotid terminus. Next, selective catheterization of the left M1 segment was performed and an EMBO trap was deployed within the left M1 segment and distal ICA. This resulted in partial extraction of the left M1 thrombus and the residual thrombus within the distal left ICA. This pass was performed at 02:15. Finally, a third pass was performed using a penumbra 43 catheter into a proximal left M2 branch occlusion which successfully yielded thrombus. This pass was performed at 02:25. There was no significant residual thromboembolic disease. Mild plaque was present within the distal left intracranial ICA. A mild stenosis is also present in the proximal left ICA origin which was estimated at less than 50% based on NASCET criteria. Post treatment TICI score 3. After reviewing the imaging, I elected to terminate the procedure at this point. Evaluation of the right femoral access site demonstrated that the site was suitable for a closure device. A 6 French Angio-Seal device was deployed without complication. Cone beam CT was then performed to evaluate for intracranial hemorrhage and treatment planning. This demonstrated minimal contrast staining. The patient was removed from anesthesia and transferred to recovery in stable condition. IMPRESSION: 1. Suction and stent retriever assisted thrombectomy of left carotid terminus and M1 occlusion. 2. Post-treatment TICI score = 3. PLAN: 1. To ICU for routine postoperative  supportive care. Electronically Signed   By: Maude Naegeli M.D.   On: 02/21/2024 09:15   IR CT Head Ltd Result Date: 02/21/2024 PROCEDURE PERFORMED: 1. Cerebral angiography with stroke thrombectomy 2. Ultrasound guided vascular access 3. Cone beam CT for treatment planning COMPARISON:  CT angiogram of the head and neck performed February 21, 2024 CLINICAL DATA:  63 year old male with acute ischemic stroke and symptoms of right-sided plegia, aphasia, and gaze preference. NIH stroke scale measures 25. Patient was a candidate for thrombolysis and also mechanical thrombectomy and presented to interventional radiology for treatment. INDICATION: Acute ischemic stroke. ANESTHESIA/SEDATION: General anesthesia was utilized for the procedure. CONTRAST:  Approximately 50 cc Ominipque 300 MEDICATIONS: See MAR FLUOROSCOPY TIME:  Fluoroscopy Time: 12 minutes, (628 mGy). COMPLICATIONS: None immediate. BODY OF REPORT: Following a full explanation of the procedure along with the potential associated complications, an informed witnessed consent was obtained. The patient was then placed under general anesthesia by the Department of Anesthesiology at [facility]. The right femoral access site was prepped and draped in the usual sterile fashion. Ultrasound was used to study the right common femoral artery which was patent. Using real-time ultrasound guidance, a 19 gauge introducer needle was used to access the right common femoral artery. Access was performed at 01:49. A hard copy image ultrasound the saved and stored in PACS. Using this access, a 6 French sheath was placed in the descending thoracic aorta. Next, selective catheterization the left internal carotid artery was performed. 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 Pretreatment TICI score 0. Next, red 72 was advanced into the distal left ICA. The first pass was performed at 0203. This  resulted in partial extraction of thrombus from the carotid terminus. Next, selective catheterization of the left M1 segment was performed and an EMBO trap was deployed  within the left M1 segment and distal ICA. This resulted in partial extraction of the left M1 thrombus and the residual thrombus within the distal left ICA. This pass was performed at 02:15. Finally, a third pass was performed using a penumbra 43 catheter into a proximal left M2 branch occlusion which successfully yielded thrombus. This pass was performed at 02:25. There was no significant residual thromboembolic disease. Mild plaque was present within the distal left intracranial ICA. A mild stenosis is also present in the proximal left ICA origin which was estimated at less than 50% based on NASCET criteria. Post treatment TICI score 3. After reviewing the imaging, I elected to terminate the procedure at this point. Evaluation of the right femoral access site demonstrated that the site was suitable for a closure device. A 6 French Angio-Seal device was deployed without complication. Cone beam CT was then performed to evaluate for intracranial hemorrhage and treatment planning. This demonstrated minimal contrast staining. The patient was removed from anesthesia and transferred to recovery in stable condition. IMPRESSION: 1. Suction and stent retriever assisted thrombectomy of left carotid terminus and M1 occlusion. 2. Post-treatment TICI score = 3. PLAN: 1. To ICU for routine postoperative supportive care. Electronically Signed   By: Maude Naegeli M.D.   On: 02/21/2024 09:15   CT C-SPINE NO CHARGE Result Date: 02/21/2024 EXAM: CT CERVICAL SPINE WITHOUT CONTRAST 02/21/2024 01:19:35 AM TECHNIQUE: CT of the cervical spine was performed without the administration of intravenous contrast. Multiplanar reformatted images are provided for review. Automated exposure control, iterative reconstruction, and/or weight based adjustment of the mA/kV was utilized  to reduce the radiation dose to as low as reasonably achievable. COMPARISON: None available. CLINICAL HISTORY: Neck trauma (Age >= 65y); Neck trauma, intoxicated or obtunded (Age >= 16y). FINDINGS: BONES AND ALIGNMENT: No acute fracture or traumatic malalignment. DEGENERATIVE CHANGES: No significant degenerative changes. SOFT TISSUES: No prevertebral soft tissue swelling. IMPRESSION: 1. No acute abnormality of the cervical spine. Electronically signed by: Franky Stanford MD 02/21/2024 01:33 AM EDT RP Workstation: [id number]   CT ANGIO HEAD NECK W WO CM (CODE STROKE) Result Date: 02/21/2024 EXAM: CTA HEAD AND NECK WITH AND WITHOUT 02/21/2024 01:18:14 AM TECHNIQUE: CTA of the head and neck was performed with and without the administration of 75 mL of intravenous iohexol  (OMNIPAQUE ) 350 MG/ML injection. Multiplanar 2D and/or 3D reformatted images are provided for review. Automated exposure control, iterative reconstruction, and/or weight based adjustment of the mA/kV was utilized to reduce the radiation dose to as low as reasonably achievable. Stenosis of the internal carotid arteries measured using NASCET criteria. COMPARISON: None available CLINICAL HISTORY: Neuro deficit, acute, stroke suspected. Stroke, Rt side weakness, hx strokes; Dr. Sallyann. FINDINGS: CTA NECK: AORTIC ARCH AND ARCH VESSELS: No dissection or arterial injury. No significant stenosis of the brachiocephalic or subclavian arteries. CERVICAL CAROTID ARTERIES: Progressive narrowing of the distal left internal carotid artery beginning at the petrous segment with occlusion at the distal cavernous segment. There is reconstitution of the supraclinoid portion with attenuated enhancement. The right internal carotid artery is patent without significant stenosis or dissection. No dissection or arterial injury is identified in the remaining cervical carotid arteries. CERVICAL VERTEBRAL ARTERIES: No dissection, arterial injury, or significant stenosis. LUNGS AND  MEDIASTINUM: Biapical Emphysema. SOFT TISSUES: No acute abnormality. BONES: No acute abnormality. CTA HEAD: ANTERIOR CIRCULATION: The left internal carotid artery demonstrates progressive narrowing beginning at the petrous segment with occlusion at the distal cavernous segment. There is reconstitution of the supraclinoid portion with attenuated  enhancement. The left MCA is occluded at its origin with poor distal collateralization. Moderate stenosis of the right MCA M1 segment. No significant stenosis of the anterior cerebral arteries. No aneurysm. POSTERIOR CIRCULATION: Mild stenosis of the right PCA p2 segment. Mild atherosclerotic irregularity of the left PCA without high-grade stenosis. No significant stenosis of the basilar artery. No significant stenosis of the vertebral arteries. No aneurysm. OTHER: No dural venous sinus thrombosis on this non-dedicated study. IMPRESSION: 1. Emergent large vessel occlusion of the left MCA at its origin with poor distal collateralization. 2. Progressive narrowing of the distal left internal carotid artery beginning at the petrous segment with occlusion at the distal cavernous segment and reconstitution of the supraclinoid portion with attenuated enhancement. 3. Moderate stenosis of the right MCA M1 segment. 4. Mild stenosis of the right PCA P2 segment. 5. Mild atherosclerotic irregularity of the left PCA without high-grade stenosis. Critical findings communicated to Dr. MAGDALENO Blower via telephone at 1:28 AM on 02/21/24. Electronically signed by: Franky Stanford MD 02/21/2024 01:32 AM EDT RP Workstation: [id number]   CT HEAD CODE STROKE WO CONTRAST Result Date: 02/21/2024 EXAM: CT HEAD WITHOUT CONTRAST 02/21/2024 12:40:34 AM TECHNIQUE: CT of the head was performed without the administration of intravenous contrast. Automated exposure control, iterative reconstruction, and/or weight based adjustment of the mA/kV was utilized to reduce the radiation dose to as low as reasonably  achievable. COMPARISON: 02/26/2019 CLINICAL HISTORY: Neuro deficit, acute, stroke suspected. Stroke, Rt side weakness. FINDINGS: BRAIN AND VENTRICLES: No acute hemorrhage. There are old infarcts of the right MCA and PCA territory. There is a focal area of loss of gray-white differentiation in the posterior left MCA territory. ASPECTS is 8. Hyperdense left MCA. No hydrocephalus, extra-axial collection, mass effect, or midline shift. ASPECTS ([place] stroke program early CT score) - ganglionic level infarction (caudate, lentiform nuclei, internal capsule, insula, M1-m3 cortex): 6 - supraganglionic infarction (m4-m6 cortex): 2 Total score (0-10 with 10 being normal): 8 ORBITS: No acute abnormality. SINUSES: No acute abnormality. SOFT TISSUES AND SKULL: No acute soft tissue abnormality. No skull fracture. IMPRESSION: 1. Acute/early subacute left MCA territory infarct with focal loss of gray-white differentiation (ASPECTS 8) and hyperdense left MCA. 2. No intracranial hemorrhage. 3. 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 Findings communicated to Dr. MAGDALENO Blower via [facility] text page at 12:50 AM on 02/21/24. Electronically signed by: Franky Stanford MD 02/21/2024 12:52 AM EDT RP Workstation: [id number]    Assessment: 63 y.o. male with PMH of prior strokes, prostate cancer, polysubstance use (marijuana, crack, tobacco, alcohol), HTN, depression, BPH who was just discharged from [facility] for large MCA infarct due to left terminal ICA and M1 occlusion status post TNK and IR with TICI3 reperfusion.  Hospitalization complicated by severe alcohol withdrawal, initially treated with Librium followed by phenobarbital, now slowly improving on phenobarbital taper. While working with PT and OT today, pt became unresponsive when trying to stand up, BP 50 to 60s.  With BP improvement, his symptoms did not improve as quick as expected. CT head stat ordered as well as EEG ordered.  Patient transferred back to acute hospital for  evaluation.  Currently, patient mental status much improved, near baseline now.  EEG running.  Plan: - CT head stat - EEG pending - close neuro monitoring - once pass swallow screening, continue current management.  - avoid low BP - TED hose and orthostatic vitals when appropriate.  - will follow.  Thank you for this consultation and allowing us  to participate in  the care of this patient.  Ary Cummins, MD PhD Stroke Neurology 02/28/2024 5:30 PM

## 2024-02-28 NOTE — Procedures (Addendum)
 History: 63 yo M being evaluated for loss of conciousness with previous strokes, evaluating for seizure predisposition  EEG Duration: 30 minutes  Sedation: none  Patient State: Awake and drowsy  Technique: This EEG was acquired with electrodes placed according to the International 10-20 electrode system (including Fp1, Fp2, F3, F4, C3, C4, P3, P4, O1, O2, T3, T4, T5, T6, A1, A2, Fz, Cz, Pz). The following electrodes were missing or displaced: none.   Background: The background consists of intermixed alpha and frontally predominant beta activities.  The posterior dominant rhythm is low voltage, but is seen with a frequency of 9 Hz.  There is anterior shifting of the PDR with drowsiness, but sleep is not recorded.  Photic stimulation: Physiologic driving is not performed  EEG Abnormalities: 1) low voltage PDR  Clinical Interpretation: This EEG revealed evidence of bilateral posterior quadrant dysfunction, consistent with the patient's known strokes. There was no seizure or seizure predisposition recorded on this study. Please note that lack of epileptiform activity on EEG does not preclude the possibility of epilepsy.   Aisha Seals, MD [facility]   If 7pm- 7am, please page neurology on call as listed in AMION.

## 2024-02-28 NOTE — Progress Notes (Signed)
 Routine EEG completed, results pending Neurology review and interpretation

## 2024-02-28 NOTE — Progress Notes (Signed)
 Physical Therapy Session Note  Patient Details  Name: Frank Fischer MRN: [id number] Date of Birth: [birth year]  Today's Date: 02/28/2024 PT Individual Time: 1305-       Skilled Therapeutic Interventions/Progress Updates:  Chart reviewed. Pt on medical hold at this time 2/2 rapid response called after OT evaluation. Pt being transferred to acute care. Pt not appropriate for PT at this time. PT can complete evaluation if pt returns to floor with appropriate orders for care. Pt missed a planned 75 minute PT session due to medical hold after rapid response.    Therapy Documentation Precautions:  Precautions Precautions: Fall Recall of Precautions/Restrictions: Impaired Precaution/Restrictions Comments: SBP < 160, bilat mitts, CIWA, Global Aphasia Restrictions Weight Bearing Restrictions Per Provider Order: No     [name] 02/28/2024, 1:16 PM

## 2024-02-28 NOTE — Progress Notes (Addendum)
 PROGRESS NOTE   Subjective/Complaints:  Called to room for AMS, became unresponsive when up in chair with OT Place pt back in bed initial BP 43/34, up to 100/60 in trendelenberg, then Rapid RN put in sup and BP 73/50 Placed in trendelenberg EKG NSR age indeterminate septal infarct, no sig change vs prior  Rapid RN in room after 250ml NS BP ~87/50   SPoke to brother medical update  ROS- unable to obtain , non verbal   Objective:   No results found. Recent Labs    02/26/24 0425 02/28/24 0648  WBC 6.2 5.9  HGB 14.2 16.1  HCT 42.6 48.2  PLT 281 282   Recent Labs    02/26/24 0425 02/28/24 0648  NA 139 140  K 3.5 4.0  CL 104 105  CO2 22 21*  GLUCOSE 94 101*  BUN 9 12  CREATININE 1.15 1.03  CALCIUM  8.8* 9.1    Intake/Output Summary (Last 24 hours) at 02/28/2024 1211 Last data filed at 02/28/2024 1020 Gross per 24 hour  Intake 120 ml  Output --  Net 120 ml        Physical Exam: Vital Signs Blood pressure 100/65, pulse 79, temperature 97.6 F (36.4 C), temperature source Oral, resp. rate 16, height 5' 7 (1.702 m), weight 83.8 kg, SpO2 95%.   General: unresponsive  Heart: Regular rate and rhythm no rubs murmurs or extra sounds Lungs: Clear to auscultation, breathing unlabored, no rales or wheezes Abdomen: Positive bowel sounds, soft nontender to palpation, nondistended Extremities: No clubbing, cyanosis, or edema Skin: No evidence of breakdown, no evidence of rash Neurologic: eyes closed , briefly opens to stim, spont movement of Left arm but not leg , no movement on RIght side     Assessment/Plan: 1. Functional deficits due to CVA now with AMS  Care Tool:  Bathing              Bathing assist       Upper Body Dressing/Undressing Upper body dressing        Upper body assist      Lower Body Dressing/Undressing Lower body dressing            Lower body assist        Toileting Toileting    Toileting assist       Transfers Chair/bed transfer  Transfers assist           Locomotion Ambulation   Ambulation assist              Walk 10 feet activity   Assist           Walk 50 feet activity   Assist           Walk 150 feet activity   Assist           Walk 10 feet on uneven surface  activity   Assist           Wheelchair     Assist               Wheelchair 50 feet with 2 turns activity    Assist  Wheelchair 150 feet activity     Assist          Blood pressure 100/65, pulse 79, temperature 97.6 F (36.4 C), temperature source Oral, resp. rate 16, height 5' 7 (1.702 m), weight 83.8 kg, SpO2 95%.  Medical Problem List and Plan: 1. 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             -patient may shower             -ELOS/Goals: 15-20 days, min assist to supervision goals with PT, OT, SLP 2.  Antithrombotics: -DVT/anticoagulation:  Mechanical: Sequential compression devices, below knee Bilateral lower extremities Pharmaceutical: Lovenox              -antiplatelet therapy: Aspirin  3. Pain Management: Tylenol  prn  4. Mood/Behavior/Sleep: LCSW to follow for evaluation and support when available.              -antipsychotic agents: Wellbutrin              - sleep chart 5. Neuropsych/cognition: This patient is not capable of making decisions on his own behalf.             -hope to see further improvement in arousal/cognition as Pb tapers off.  6. Skin/Wound Care: Routine pressure relief measures 7. Fluids/Electrolytes/Nutrition: Monitor intake and output.  Follow-up chemistries in AM.             - Ensure+ high protein +multivitamin+folic acid               -encourage adequate PO 8.  Hypertension: on home Amlodipine  10 mg and lisinopril  40 mg 9. BPH: Flomax  0.4 mg 10.  HLD: LDL 111 goal < 70--Zetia  10 mg and atorvastatin  80 mg 11.  Poststroke dysphagia: SLP consult continue dysphagia 3 12.  Tobacco abuse: nicotine patch 7 mg  13.  Polysubstance abuse: Tox screen positive for cocaine and marijuana. 14.  Acute alcohol withdrawal: History of dependence drinking at least 6 beers daily. On 10/7 pt experienced restlessness/agitation and bradycardia concerning for ETOH withdrawal.              -continue Thiamine   -10/8 placed on phenobarbital taper with agitation improving, continue taper              -10/9 CIWA =9--- was receiving Ativan  1 mg as needed, no longer on. 15.  Hypokalemia:K+replaced with 40 mEq on 10/8. Now 3.5 and stable. Continue to monitor     AMS- + hypotension, +/- seizure +/- new CVA have contacted Neuro will ask hospitalist to transfer       LOS: 1 days A FACE TO FACE EVALUATION WAS PERFORMED  Prentice FORBES Compton 02/28/2024, 12:11 PM

## 2024-02-28 NOTE — Progress Notes (Signed)
 Occupational Therapy Session Note  Patient Details  Name: Frank Fischer MRN: [id number] Date of Birth: [birth year]  Today's Date: 02/28/2024 OT Individual Time: 1100-1135 OT Individual Time Calculation (min): 35 min    Short Term Goals: Week 1:  OT Short Term Goal 1 (Week 1): The pt will safely come from sit to stand with MinA using the stedy x 1 OT Short Term Goal 2 (Week 1): The pt will safely maintain upright posiiton while in sit, with BLE supported at SBA. OT Short Term Goal 3 (Week 1): The pt will safely bathe his UB and upper LB with SBA OT Short Term Goal 4 (Week 1): The pt will safely  dress his UB/LB with MinA incorporating A/E as needed OT Short Term Goal 5 (Week 1): The pt will safely complete task attending to the R side of the body with miinimum cues and good attention to task..  Skilled Therapeutic Interventions/Progress Updates:    Patient seen this date follow skilled OT evaluation. Patient presents with global aphasia, but is willing to participate.  The pt required vc's for attending to the R. Patient was in agreement with completing NMR to improve upon residual deficits impacting his hemiparetic R side.  The pt's family was present so we completed the remaining subjective parts of the evaluation and then I was able to complete manual manipulation to the pt's scapular and surrounding structures, his neck region, shld's, BUE and hands in various planes with opportunities for the pt to engage in a resistive exercise going into shld flexion with the RUE. About 35 minutes into the second session, the pt went into a clonus position with his head and neck hyperextended to the R . Nursing was called and the emergency response treatments responded.  With further evaluation of the pt's medical status, I was informed that he would be transferred to acute care to address his needs.   Balance/vestibular training;Neuromuscular re-education;Therapeutic Exercise;Self Care/advanced ADL  retraining;DME/adaptive equipment instruction;Cognitive remediation/compensation;UE/LE Strength taining/ROM;Functional electrical stimulation;Patient/family education;Splinting/orthotics;UE/LE Coordination activities;Functional mobility training;Therapeutic Activities;Visual/perceptual remediation/compensation   Therapy Documentation Precautions:  Precautions Precautions: Fall Recall of Precautions/Restrictions: Impaired Precaution/Restrictions Comments: SBP < 160, bilat mitts, CIWA, Global Aphasia Restrictions Weight Bearing Restrictions Per Provider Order: No General: General PT Missed Treatment Reason: MD hold (Comment) (rapid response called, pt being transferred back to acute) Vital Signs: Therapy Vitals Pulse Rate: 75 BP: 104/69 Oxygen Therapy SpO2: 100 % Pain:   ADL: ADL Eating: Dependent Where Assessed-Eating: Wheelchair Grooming: Setup Where Assessed-Grooming: Sitting at sink Upper Body Bathing: Setup, Minimal assistance Where Assessed-Upper Body Bathing: Sitting at sink Lower Body Bathing: Dependent Where Assessed-Lower Body Bathing: Sitting at sink Upper Body Dressing: Minimal assistance Where Assessed-Upper Body Dressing: Sitting at sink Lower Body Dressing: Dependent Where Assessed-Lower Body Dressing: Sitting at sink Toileting: Dependent Where Assessed-Toileting: Other (Comment) (Based on current funcitonal status with LB task performance) Toilet Transfer: Dependent Toilet Transfer Method: Other (comment) (Stedy x 2) Toilet Transfer Equipment: Other (comment) (Stedy x 2) Tub/Shower Transfer: Moderate assistance (Modx 2 using the stedy) Tub/Shower Transfer Method:  (Didn't occur, patient ModAx 2 using stedy) Vision   Perception    Praxis   Balance Dynamic Sitting Balance Sitting balance - Comments: able to sit upright today after correcting initial R lateral bias, benefits from starting on L elbow propped to correct Exercises:   Other Treatments:      Therapy/Group: Individual Therapy  Frank Fischer 02/28/2024, 4:53 PM

## 2024-02-28 NOTE — H&P (Signed)
 History and Physical    Frank Fischer FMW:[id number] DOB: February 03, 1961 DOA: 02/27/2024  Referring MD/NP/PA: Rehab MD PCP:  Patient coming from:   Chief Complaint: Passed out  HPI: Frank Fischer is a 63 y.o. 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 Subsequently treated with TNK and thrombectomy was transition to progressive care.  Hospitalization complicated by severe alcohol withdrawal, initially treated with Librium followed by phenobarbital, now slowly improving on phenobarbital taper.  He was discharged to CIR yesterday, today when physical therapy tried to get him up he became unresponsive, systolic blood pressure dropped to the 50s/60s range, improved to low 100 after he was put in Trendelenburg position, slowly improved, mental status improving however does have dysarthria from his current stroke  Review of Systems: As per HPI otherwise 14 point review of systems negative.   Past Medical History:  Diagnosis Date   Arthritis    Cervical stenosis of spine    Chronic headaches    Dental caries    periodontal disease   Frequency of urination    History of cerebral infarction 11-28-2017  per pt never had symptoms   per MRI imaging 10-31-2016 2 small chronic lacunar infartions in the right frontal periventricular white matter , and chronic left medial orbital fracture (as seen 2014 MRI)   Hypertension    Noncompliance with medication regimen    Prostate cancer [facility]) urologist-  dr winter/  oncologist-  dr patrcia   dx 07-21-2017--- Stage T1c, Gleason 4+3,  PSA 13.40,  vol 49.7cc--  scheduled for radioactive seed implants 12-03-2017    Past Surgical History:  Procedure Laterality Date   ABDOMINAL EXPLORATION SURGERY  age 62   per pt  had Small Bowel Resection and Appendectomy   APPENDECTOMY     ARM SURGERY Left 1999   REPAIR INJURY-- ARM CUT ABOUT OFF   IR CATHETER TUBE CHANGE  10/28/2021   IR CT HEAD LTD  02/21/2024   IR GUIDED DRAIN W CATHETER PLACEMENT  09/03/2021   IR PERCUTANEOUS ART THROMBECTOMY/INFUSION INTRACRANIAL INC DIAG ANGIO  02/21/2024   IR US  GUIDANCE  09/03/2021   IR US  GUIDE VASC ACCESS RIGHT  02/21/2024   PROSTATE BIOPSY  07/21/2017  dr winter office   RADIOACTIVE SEED IMPLANT N/A 12/03/2017   Procedure: RADIOACTIVE SEED IMPLANT/BRACHYTHERAPY IMPLANT;  Surgeon: Devere Lonni Righter, MD;  Location: [facility];  Service: Urology;  Laterality: N/A;  ONLY NEEDS 120 MIN   RADIOLOGY WITH ANESTHESIA N/A 02/21/2024   Procedure: RADIOLOGY WITH ANESTHESIA;  Surgeon: Radiologist, Medication, MD;  Location: [facility] OR;  Service: Radiology;  Laterality: N/A;   SPACE OAR INSTILLATION N/A 12/03/2017   Procedure: SPACE OAR INSTILLATION;  Surgeon: Devere Lonni Righter, MD;  Location: [facility];  Service: Urology;  Laterality: N/A;   TOOTH EXTRACTION N/A 09/21/2018   Procedure: DENTAL EXTRACTIONS WITH ALVEOLIPLASTY;  Surgeon: Sheryle Hamilton, DDS;  Location: [facility] OR;  Service: Oral Surgery;  Laterality: N/A;     reports that he has been smoking cigarettes. He has a 10 pack-year smoking history. He has never used smokeless tobacco. He reports current alcohol use of about 21.0 standard drinks of alcohol per week. He reports current drug use. Drug: Marijuana.  Allergies  Allergen Reactions   Penicillins Itching    Family History  Problem Relation Age of Onset   Lung cancer Mother    Hypertension Brother    Diabetes Paternal Grandfather    Prostate cancer Cousin      Prior to Admission medications   Medication Sig Start Date End Date Taking? Authorizing Provider  albuterol  (VENTOLIN  HFA) 108 (90 Base) MCG/ACT inhaler Inhale 2 puffs into the lungs every 6 (six) hours as needed for  wheezing or shortness of breath. 07/22/23   Ngetich, Dinah C, NP  amLODipine  (NORVASC ) 10 MG tablet Take 1 tablet (10 mg total) by mouth daily. Patient not taking: Reported on 02/21/2024 07/22/23   Ngetich, Dinah C, NP  aspirin  EC 81 MG tablet Take 81 mg by mouth daily. Patient not taking: Reported on 02/21/2024    [provider]  atorvastatin  (LIPITOR ) 80 MG tablet Take 1 tablet (80 mg total) by mouth daily. 07/22/23   Ngetich, Dinah C, NP  buPROPion  (WELLBUTRIN  SR) 200 MG 12 hr tablet Take 1 tablet (200 mg total) by mouth 2 (two) times daily. Patient not taking: Reported on 02/21/2024 07/22/23   Ngetich, Dinah C, NP  ezetimibe  (ZETIA ) 10 MG tablet Take 1 tablet (10 mg total) by mouth daily. 08/07/23   Ngetich, Dinah C, NP  feeding supplement (ENSURE PLUS HIGH PROTEIN) LIQD Take 237 mLs by mouth 2 (two) times daily between meals. 02/27/24   Gherghe, Costin M, MD  folic acid  (FOLVITE ) 1 MG tablet Take 1 tablet (1 mg total) by mouth daily. 02/28/24   Gherghe, Costin M, MD  lisinopril  (ZESTRIL ) 40 MG tablet Take 1 tablet (40 mg total) by mouth every morning. 07/22/23   Ngetich, Dinah C, NP  Multiple Vitamin (MULTIVITAMIN WITH MINERALS) TABS tablet Take 1 tablet by mouth daily. 02/28/24   Gherghe, Costin M, MD  nicotine (NICODERM CQ - DOSED IN MG/24 HR) 7 mg/24hr patch Place 1 patch (7 mg total) onto the skin daily. 02/28/24   Gherghe, Costin M, MD  PHENobarbital (LUMINAL) 32.4 MG tablet Take 2 tablets (64.8 mg total) by mouth every 8 (eight) hours for 2 days, THEN 1 tablet (32.4 mg total) every 8 (eight) hours for 2 days. 02/27/24 03/02/24  Trixie Nilda HERO, MD  tamsulosin  (FLOMAX ) 0.4 MG CAPS capsule Take 1 capsule (0.4 mg total) by mouth daily. Patient not taking: Reported on 02/21/2024 07/22/23   Ngetich, Dinah C, NP  thiamine  (VITAMIN B-1) 100 MG tablet Take 1 tablet (100 mg total) by mouth daily. 02/28/24   Gherghe, Costin M, MD  triamcinolone  cream (KENALOG ) 0.1 % Apply 1 Application topically 2  (two) times daily. Patient taking differently: Apply 1 Application topically 2 (two) times daily as needed (skin irritation). 07/22/23   Ngetich, Roxan BROCKS, NP    Physical Exam: Vitals:   02/28/24 1252 02/28/24 1255 02/28/24 1300 02/28/24 1310  BP: 91/60 92/63 98/64  91/66  Pulse: 82 75 75 79  Resp:      Temp:      TempSrc:      SpO2: 97% 100% 100%   Weight:      Height:          Constitutional: Chronically ill male, right-sided neglect HEENT:no JVD right ptosis Respiratory: clear to auscultation bilaterally Cardiovascular: S1S2/RRR Abdomen: soft, non tender, Bowel sounds positive.  Musculoskeletal: Right hemiplegia Ext: no edema Skin: no rashes Neurologic: Awake alert, dysarthria noted, right hemiplegia Psychiatric: Affect  Labs on Admission: I have personally reviewed following labs and imaging studies  CBC: Recent Labs  Lab 02/22/24 0510 02/23/24 0646 02/26/24  0425 02/28/24 0648  WBC 7.6 5.9 6.2 5.9  NEUTROABS 4.5 4.1  --  4.0  HGB 13.7 13.6 14.2 16.1  HCT 40.7 39.7 42.6 48.2  MCV 95.1 95.7 95.3 95.6  PLT 245 199 281 282   Basic Metabolic Panel: Recent Labs  Lab 02/22/24 0510 02/23/24 0646 02/26/24 0425 02/28/24 0648  NA 139 136 139 140  K 3.8 3.4* 3.5 4.0  CL 109 104 104 105  CO2 23 21* 22 21*  GLUCOSE 96 99 94 101*  BUN 7* 7* 9 12  CREATININE 1.03 1.04 1.15 1.03  CALCIUM  8.2* 8.3* 8.8* 9.1  MG  --   --  2.1  --   PHOS  --   --  4.8*  --    GFR: Estimated Creatinine Clearance: 74 mL/min (by C-G formula based on SCr of 1.03 mg/dL). Liver Function Tests: Recent Labs  Lab 02/28/24 0648  AST 32  ALT 28  ALKPHOS 105  BILITOT 0.8  PROT 7.2  ALBUMIN 3.7   No results for input(s): LIPASE, AMYLASE in the last 168 hours. No results for input(s): AMMONIA in the last 168 hours. Coagulation Profile: No results for input(s): INR, PROTIME in the last 168 hours. Cardiac Enzymes: No results for input(s): CKTOTAL, CKMB, CKMBINDEX,  TROPONINI in the last 168 hours. BNP (last 3 results) No results for input(s): PROBNP in the last 8760 hours. HbA1C: No results for input(s): HGBA1C in the last 72 hours. CBG: Recent Labs  Lab 02/22/24 1548 02/22/24 2016 02/28/24 1205  GLUCAP 111* 113* 101*   Lipid Profile: No results for input(s): CHOL, HDL, LDLCALC, TRIG, CHOLHDL, LDLDIRECT in the last 72 hours. Thyroid Function Tests: No results for input(s): TSH, T4TOTAL, FREET4, T3FREE, THYROIDAB in the last 72 hours. Anemia Panel: No results for input(s): VITAMINB12, FOLATE, FERRITIN, TIBC, IRON, RETICCTPCT in the last 72 hours. Urine analysis:    Component Value Date/Time   COLORURINE YELLOW 01/10/2024 1046   APPEARANCEUR HAZY (A) 01/10/2024 1046   LABSPEC 1.015 01/10/2024 1046   PHURINE 5.0 01/10/2024 1046   GLUCOSEU NEGATIVE 01/10/2024 1046   HGBUR MODERATE (A) 01/10/2024 1046   BILIRUBINUR NEGATIVE 01/10/2024 1046   BILIRUBINUR negative 07/31/2022 0000   KETONESUR NEGATIVE 01/10/2024 1046   PROTEINUR NEGATIVE 01/10/2024 1046   UROBILINOGEN 0.2 07/31/2022 0000   UROBILINOGEN 0.2 04/30/2013 1111   NITRITE POSITIVE (A) 01/10/2024 1046   LEUKOCYTESUR LARGE (A) 01/10/2024 1046   Sepsis Labs: @LABRCNTIP (procalcitonin:4,lacticidven:4) ) Recent Results (from the past 240 hours)  Resp panel by RT-PCR (RSV, Flu A&B, Covid) Anterior Nasal Swab     Status: None   Collection Time: 02/21/24  1:11 AM   Specimen: Anterior Nasal Swab  Result Value Ref Range Status   SARS Coronavirus 2 by RT PCR NEGATIVE NEGATIVE Final   Influenza A by PCR NEGATIVE NEGATIVE Final   Influenza B by PCR NEGATIVE NEGATIVE Final    Comment: (NOTE) The Xpert Xpress SARS-CoV-2/FLU/RSV plus assay is intended as an aid in the diagnosis of influenza from Nasopharyngeal swab specimens and should not be used as a sole basis for treatment. Nasal washings and aspirates are unacceptable for Xpert Xpress  SARS-CoV-2/FLU/RSV testing.  Fact Sheet for Patients: BloggerCourse.com  Fact Sheet for Healthcare Providers: SeriousBroker.it  This test is not yet approved or cleared by the [place]  FDA and has been authorized for detection and/or diagnosis of SARS-CoV-2 by FDA under an Emergency Use Authorization (EUA). This EUA will remain in effect (meaning this test can be  used) for the duration of the COVID-19 declaration under Section 564(b)(1) of the Act, 21 U.S.C. section 360bbb-3(b)(1), unless the authorization is terminated or revoked.     Resp Syncytial Virus by PCR NEGATIVE NEGATIVE Final    Comment: (NOTE) Fact Sheet for Patients: BloggerCourse.com  Fact Sheet for Healthcare Providers: SeriousBroker.it  This test is not yet approved or cleared by the [place]  FDA and has been authorized for detection and/or diagnosis of SARS-CoV-2 by FDA under an Emergency Use Authorization (EUA). This EUA will remain in effect (meaning this test can be used) for the duration of the COVID-19 declaration under Section 564(b)(1) of the Act, 21 U.S.C. section 360bbb-3(b)(1), unless the authorization is terminated or revoked.  Performed at [facility], [address] [place], [place] [address]   MRSA Next Gen by PCR, Nasal     Status: None   Collection Time: 02/21/24  6:14 AM   Specimen: Nasal Swab  Result Value Ref Range Status   MRSA by PCR Next Gen NOT DETECTED NOT DETECTED Final    Comment: (NOTE) The GeneXpert MRSA Assay (FDA approved for NASAL specimens only), is one component of a comprehensive MRSA colonization surveillance program. It is not intended to diagnose MRSA infection nor to guide or monitor treatment for MRSA infections. Test performance is not FDA approved in patients less than 72 years old. Performed at [facility], [address] [place],  [place] [address]      Radiological Exams on Admission: No results found.  EKG: Ordered and pending  Assessment/Plan     Syncope -Suspect syncopal event related to orthostatic hypotension - Neurology consulting, also recommended EEG to rule out seizures - He is on a phenobarbital taper for EtOH withdrawal, does not appear to be acutely withdrawing at this time,  day 8 of hospitalization today - Continue gentle IV fluids for today, hold amlodipine  and lisinopril  - Suspect he could have autonomic neuropathy from longstanding EtOH abuse - Repeat orthostatics tomorrow  Left acute MCA stroke -With right hemiplegia, dysarthria, aphasia - Treated with mechanical thrombectomy and tPA for large vessel stroke - MRI noted large left MCA infarct - Echo noted preserved EF, LDL 111, A1c 6.1  Alcohol and polysubstance abuse - EtOH level was 130 on admission, positive for cocaine and cannabis as well -Per spouse drinks 6-8 beers per day - Counseled, continue thiamine , multivitamins, complete phenobarbital taper  COPD Tobacco abuse - Counseled, albuterol  as needed  Hypertension - Now with syncope and orthostatic hypotension holding lisinopril  and amlodipine  today  BPH Continue Flomax   Depression Continue bupropion      DVT prophylaxis: Lovenox  Code Status: Full code Family Communication: Discussed with wife at bedside Disposition Plan: inpt   Sigurd Pac MD [facility]   02/28/2024, 1:33 PM

## 2024-02-28 NOTE — Significant Event (Signed)
 Rapid Response Event Note   Reason for Call :  Patient was up to chair working with OT when patient became rigid with right upward gaze.   Initial Focused Assessment:  Patient in bed on my arrival. Eyes closed. Responds briefly to sternal rub and attempts to speak, however does not answer orientation questions. Pupils pinpoint, eyes midline. Clear breath sounds. Upper airway secretions, he is able to cough and clear. Heart rate regular, no adventitious heart sounds. Skin warm, dry, pink.  VS: T 97.33F, BP 83/61, HR 77, RR 24, SpO2 97% on room air CBG: 101  Interventions:  -CBG -500cc IVF bolus- followed by maintenance fluids   Plan of Care:  -Arousability improving with IVF/ BP improving -Admit to inpatient- transferred to 3W09  Event Summary:  MD Notified: Dr. Carilyn Call Time: 1200 Arrival Time: 1205 End Time: 1451  Leonor LITTIE Danker, RN

## 2024-02-28 NOTE — Evaluation (Signed)
 Occupational Therapy Assessment and Plan  Patient Details  Name: Frank Fischer MRN: [id number] Date of Birth: 06/28/1960  OT Diagnosis: abnormal posture, apraxia, cognitive deficits, hemiplegia affecting dominant side, and muscle weakness (generalized) Rehab Potential: Rehab Potential (ACUTE ONLY): Good ELOS: 3-4weeks   Today's Date: 02/28/2024 OT Individual Time: 0900-1015 OT Individual Time Calculation (min): 75 min     Hospital Problem: Principal Problem:   Acute CVA (cerebrovascular accident) [facility]) Active Problems:   Syncope, vasovagal   Past Medical History:  Past Medical History:  Diagnosis Date   Arthritis    Cervical stenosis of spine    Chronic headaches    Dental caries    periodontal disease   Frequency of urination    History of cerebral infarction 11-28-2017  per pt never had symptoms   per MRI imaging 10-31-2016 2 small chronic lacunar infartions in the right frontal periventricular white matter , and chronic left medial orbital fracture (as seen 2014 MRI)   Hypertension    Noncompliance with medication regimen    Prostate cancer [facility]) urologist-  dr winter/  oncologist-  dr patrcia   dx 07-21-2017--- Stage T1c, Gleason 4+3,  PSA 13.40,  vol 49.7cc--  scheduled for radioactive seed implants 12-03-2017   Past Surgical History:  Past Surgical History:  Procedure Laterality Date   ABDOMINAL EXPLORATION SURGERY  age 11   per pt had Small Bowel Resection and Appendectomy   APPENDECTOMY     ARM SURGERY Left 1999   REPAIR INJURY-- ARM CUT ABOUT OFF   IR CATHETER TUBE CHANGE  10/28/2021   IR CT HEAD LTD  02/21/2024   IR GUIDED DRAIN W CATHETER PLACEMENT  09/03/2021   IR PERCUTANEOUS ART THROMBECTOMY/INFUSION INTRACRANIAL INC DIAG ANGIO  02/21/2024   IR US  GUIDANCE  09/03/2021   IR US  GUIDE VASC ACCESS RIGHT  02/21/2024   PROSTATE BIOPSY  07/21/2017  dr winter office   RADIOACTIVE SEED IMPLANT N/A 12/03/2017   Procedure: RADIOACTIVE SEED IMPLANT/BRACHYTHERAPY  IMPLANT;  Surgeon: Devere Lonni Righter, MD;  Location: [facility];  Service: Urology;  Laterality: N/A;  ONLY NEEDS 120 MIN   RADIOLOGY WITH ANESTHESIA N/A 02/21/2024   Procedure: RADIOLOGY WITH ANESTHESIA;  Surgeon: Radiologist, Medication, MD;  Location: [facility] OR;  Service: Radiology;  Laterality: N/A;   SPACE OAR INSTILLATION N/A 12/03/2017   Procedure: SPACE OAR INSTILLATION;  Surgeon: Devere Lonni Righter, MD;  Location: [facility];  Service: Urology;  Laterality: N/A;   TOOTH EXTRACTION N/A 09/21/2018   Procedure: DENTAL EXTRACTIONS WITH ALVEOLIPLASTY;  Surgeon: Sheryle Hamilton, DDS;  Location: [facility] OR;  Service: Oral Surgery;  Laterality: N/A;    Assessment & Plan Clinical Impression: [name] is a 63 year old male with PMHx of hx of multiple prior strokes (R sided, with residual mild L hemiplegia), prostate cancer (s/p brachytherapy, 2018), polysubstance use (marijuana, crack, tobacco, alcohol), HTN, depression, and BPH. Patient presented to [facility] on 02/21/2024 with a left gaze preference and right hemiparesis. In the Emergency Department, his NIHSS score was 25. The patient was last seen normal at 11:30 PM. According to the chart, the patient was found down after going to the bathroom with left gaze and right-sided weakness.  A CT head revealed hypodensity in the right frontal, bilateral, and parietal lobes with a hyperdense left MCA. A CT angiogram of the head and neck showed a left ICA and M1 occlusion. The patient was administered TNK and underwent mechanical thrombectomy  successfully. An MRI confirmed a large subacute left MCA infarct with a small petechial hemorrhage. Neurology recommended aspirin  alone due to the large size of the strokes. 2D echo revealed LVEF 60 to 65% with normal function. Hgb A1c 6.1.  UDS positive for THC and cocaine, Ethanol 130 (in ED).    The hospital course was complicated by severe agitation and an  elevated CIWAscore, with the patient having a history of daily alcohol use (six-pack per day). Prior to arrival, the patient was independent but did not drive and lived in a one-level home with a level entry with his spouse. He currently requires moderate assistance, +2 for physical assistance, +2 for safety/equipment, Cueing for sequencing for functional mobility.  Needs assist for power up, rise, steady for transfers. Therapy evaluations completed due to patient decreased functional mobility was admitted for a comprehensive rehab program. transferred to CIR on 02/27/2024 .    Patient currently requires mod to Dep with basic self-care skills secondary to decreased coordination and decreased motor planning, decreased visual acuity and decreased visual motor skills, decreased attention to right, right side neglect, and decreased motor planning, and decreased initiation, decreased attention, decreased problem solving, and delayed processing.  Prior to hospitalization, patient could complete BADL related task with independent .  Patient will benefit from skilled intervention to decrease level of assist with basic self-care skills prior to discharge home with care partner.  Anticipate patient will require 24 hour supervision and TBD.  OT - End of Session Activity Tolerance: Tolerates 10 - 20 min activity with multiple rests Endurance Deficit: Yes OT Assessment Rehab Potential (ACUTE ONLY): Good OT Patient demonstrates impairments in the following area(s): Endurance;Balance;Cognition;Motor OT Basic ADL's Functional Problem(s): Bathing;Dressing;Toileting OT Transfers Functional Problem(s): Tub/Shower;Toilet OT Additional Impairment(s): Fuctional Use of Upper Extremity (R hemi for dominate extremity) OT Plan OT Intensity: Minimum of 1-2 x/day, 45 to 90 minutes OT Frequency: 5 out of 7 days OT Duration/Estimated Length of Stay: 3-4weeks OT Treatment/Interventions: Balance/vestibular training;Neuromuscular  re-education;Therapeutic Exercise;Self Care/advanced ADL retraining;DME/adaptive equipment instruction;Cognitive remediation/compensation;UE/LE Strength taining/ROM;Functional electrical stimulation;Patient/family education;Splinting/orthotics;UE/LE Coordination activities;Functional mobility training;Therapeutic Activities;Visual/perceptual remediation/compensation OT Self Feeding Anticipated Outcome(s): ModI OT Basic Self-Care Anticipated Outcome(s): ModI OT Toileting Anticipated Outcome(s): ModI OT Bathroom Transfers Anticipated Outcome(s): ModI OT Recommendation Recommendations for Other Services: None Patient destination: Home Follow Up Recommendations: Other (comment) (TBD) Equipment Recommended:  (TBD) Equipment Details: TBD   OT Evaluation Precautions/Restrictions    General PT Missed Treatment Reason: MD hold (Comment) (rapid response called, pt being transferred back to acute) Vital Signs Therapy Vitals Temp: 97.7 F (36.5 C) Temp Source: Oral Pulse Rate: 75 BP: 104/69 Oxygen Therapy SpO2: 100 % Pain   Home Living/Prior Functioning Home Living Family/patient expects to be discharged to:: Private residence Living Arrangements: Spouse/significant other Available Help at Discharge: Family Home Access: Level entry (3 stair) Home Layout: One level Bathroom Shower/Tub: Engineer, manufacturing systems: Standard Additional Comments: may need to confirm at later date due to expressive and receptive deficits, unsure of accuracy  Lives With: Spouse Tawnya Gent) Prior Function Level of Independence: Independent with basic ADLs Driving: No Vision Baseline Vision/History:  (no glasses, however, pt presents with severe L gaze) Patient Visual Report: Other (comment) (additional testing required) Vision Assessment?: Vision impaired- to be further tested in functional context Perception  Perception: Impaired Perception-Other Comments: R inattention with L heavy gaze, further  testing needed to determine visual acuity Praxis   Cognition Cognition Overall Cognitive Status: Impaired/Different from baseline Arousal/Alertness: Awake/alert Sustained Attention: Impaired (Global aphasis)  Awareness: Impaired Problem Solving: Impaired Problem Solving Impairment: Functional basic Brief Interview for Mental Status (BIMS) Repetition of Three Words (First Attempt): No answer (Global aphasia) Temporal Orientation: Year: No answer Temporal Orientation: Month: No answer Temporal Orientation: Day: No answer Recall: Sock: No answer Recall: Blue: No answer Recall: Bed: No answer BIMS Summary Score: 99 Sensation Sensation Light Touch: Not tested (further testing needed) Coordination Gross Motor Movements are Fluid and Coordinated: No Fine Motor Movements are Fluid and Coordinated: No (R hemi  with UE) Motor  Motor Motor: Ataxia;Hemiplegia Motor - Skilled Clinical Observations: R hemi with UE, pt presents as R hand dominate  Trunk/Postural Assessment  Cervical Assessment Cervical Assessment: Exceptions to [facility] Thoracic Assessment Thoracic Assessment: Exceptions to [facility] Lumbar Assessment Lumbar Assessment: Exceptions to [facility] Postural Control Postural Control: Deficits on evaluation (challenges with maintaining upright position while sitting EOB)  Balance Dynamic Sitting Balance Sitting balance - Comments: able to sit upright today after correcting initial R lateral bias, benefits from starting on L elbow propped to correct Extremity/Trunk Assessment RUE Assessment Passive Range of Motion (PROM) Comments: WFL Active Range of Motion (AROM) Comments: WFL General Strength Comments: 3-/5MMT LUE Assessment Passive Range of Motion (PROM) Comments: WFL Active Range of Motion (AROM) Comments: WFL General Strength Comments: 4-/5MMT  Care Tool Care Tool Self Care Eating   Eating Assist Level: Dependent - Patient 0%    Oral Care    Oral Care Assist Level: Set up  assist (s/u A to MinA)    Bathing   Body parts bathed by patient: Chest;Abdomen;Front perineal area;Right upper leg;Left upper leg;Face;Right arm   Body parts n/a: Left arm;Buttocks;Right lower leg;Left lower leg Assist Level: Minimal Assistance - Patient > 75% (Min to ModA)    Upper Body Dressing(including orthotics)   What is the patient wearing?: Hospital gown only   Assist Level: Minimal Assistance - Patient > 75%    Lower Body Dressing (excluding footwear)   What is the patient wearing?: Hospital gown only;Incontinence brief Assist for lower body dressing: Minimal Assistance - Patient > 75% (MinA for Hospital gown and Dep with brief)    Putting on/Taking off footwear   What is the patient wearing?: Non-skid slipper socks Assist for footwear: Dependent - Patient 0%       Care Tool Toileting Toileting activity   Assist for toileting: Maximal Assistance - Patient 25 - 49% (based on performance with LB task)     Care Tool Bed Mobility Roll left and right activity   Roll left and right assist level: Moderate Assistance - Patient 50 - 74%    Sit to lying activity   Sit to lying assist level: Moderate Assistance - Patient 50 - 74%    Lying to sitting on side of bed activity   Lying to sitting on side of bed assist level: the ability to move from lying on the back to sitting on the side of the bed with no back support.: Moderate Assistance - Patient 50 - 74%     Care Tool Transfers Sit to stand transfer   Sit to stand assist level: Moderate Assistance - Patient 50 - 74% (ModAx2 using the Stedy)    Chair/bed transfer   Chair/bed transfer assist level: Moderate Assistance - Patient 50 - 74%     Toilet transfer   Assist Level: Moderate Assistance - Patient 50 - 74% (using the stedy)     Care Tool Cognition  Expression of Ideas and Wants Expression of Ideas and Wants: 1.  Rarely/Never expressess or very difficult - rarely/never expresses self or speech is very difficult to  understand (global aphasia)  Understanding Verbal and Non-Verbal Content Understanding Verbal and Non-Verbal Content: 1. Rarely/never understands (global aphasia)   Memory/Recall Ability Memory/Recall Ability :  (unable to determine)   Refer to Care Plan for Long Term Goals  SHORT TERM GOAL WEEK 1 OT Short Term Goal 1 (Week 1): The pt will safely come from sit to stand with MinA using the stedy x 1 OT Short Term Goal 2 (Week 1): The pt will safely maintain upright posiiton while in sit, with BLE supported at SBA. OT Short Term Goal 3 (Week 1): The pt will safely bathe his UB and upper LB with SBA OT Short Term Goal 4 (Week 1): The pt will safely  dress his UB/LB with MinA incorporating A/E as needed OT Short Term Goal 5 (Week 1): The pt will safely complete task attending to the R side of the body with miinimum cues and good attention to task..  Recommendations for other services: Other: TBD   Skilled Therapeutic Intervention  Patient seen this dated for skilled OT evaluation to improve upon functional Independence with BADL related task to reduce the burden of care for the care provider. The pt presents with a heavy left gaze, requiring constant cues for attending to the R side.  The pt presents with hemiparesis of the RUE with AROM/PROM WFL at 3-5 MMT, the pt is R hand dominate. He presents with the LUE [facility] for AROM/PROM at 4/5MMT. The pt is able to come from supine in bed to EOB with ModA with challenges for maintaining upright position consistently while seated with his feet supported. The pt is Mod to MinA A with upper body task performance and Dep with LB task performance in bathing and dressing. The pt was able to come from sit to stand using the Stedy at ModAx1 for placement at w/c LOF.  The present with severe global aphasia impacting his ability to consistently comprehend and  articulate his needs. The pt presents with a willingness to improve through participation and is recommended for  3-4 weeks of occupational Therapy to maximize his functional outcome with modifications in the POC as needed. The pt requires additional screening in relation to cognition and visual perception   ADL   Mobility  Bed Mobility Bed Mobility: Supine to Sit Supine to Sit: Moderate Assistance - Patient 50-74% Transfers Sit to Stand: Minimal Assistance - Patient > 75% (Using the stedy) Stand to Sit: Moderate Assistance - Patient 50-74% (ModAx2 using the stedy)   Discharge Criteria: Patient will be discharged from OT if patient refuses treatment 3 consecutive times without medical reason, if treatment goals not met, if there is a change in medical status, if patient makes no progress towards goals or if patient is discharged from hospital.  The above assessment, treatment plan, treatment alternatives and goals were discussed and mutually agreed upon: by patient and by family  Elvera JONETTA Mace 02/28/2024, 4:37 PM

## 2024-02-29 DIAGNOSIS — I63512 Cerebral infarction due to unspecified occlusion or stenosis of left middle cerebral artery: Secondary | ICD-10-CM

## 2024-02-29 DIAGNOSIS — I779 Disorder of arteries and arterioles, unspecified: Secondary | ICD-10-CM

## 2024-02-29 DIAGNOSIS — E785 Hyperlipidemia, unspecified: Secondary | ICD-10-CM

## 2024-02-29 DIAGNOSIS — F17210 Nicotine dependence, cigarettes, uncomplicated: Secondary | ICD-10-CM

## 2024-02-29 DIAGNOSIS — R29718 NIHSS score 18: Secondary | ICD-10-CM | POA: Diagnosis not present

## 2024-02-29 DIAGNOSIS — I69391 Dysphagia following cerebral infarction: Secondary | ICD-10-CM

## 2024-02-29 DIAGNOSIS — R233 Spontaneous ecchymoses: Secondary | ICD-10-CM

## 2024-02-29 DIAGNOSIS — I10 Essential (primary) hypertension: Secondary | ICD-10-CM

## 2024-02-29 DIAGNOSIS — R55 Syncope and collapse: Secondary | ICD-10-CM | POA: Diagnosis not present

## 2024-02-29 LAB — COMPREHENSIVE METABOLIC PANEL WITH GFR
ALT: 29 U/L (ref 0–44)
AST: 33 U/L (ref 15–41)
Albumin: 3.6 g/dL (ref 3.5–5.0)
Alkaline Phosphatase: 110 U/L (ref 38–126)
Anion gap: 16 — ABNORMAL HIGH (ref 5–15)
BUN: 14 mg/dL (ref 8–23)
CO2: 21 mmol/L — ABNORMAL LOW (ref 22–32)
Calcium: 8.9 mg/dL (ref 8.9–10.3)
Chloride: 107 mmol/L (ref 98–111)
Creatinine, Ser: 1.13 mg/dL (ref 0.61–1.24)
GFR, Estimated: >60 mL/min (ref >60)
Glucose, Bld: 94 mg/dL (ref 70–99)
Potassium: 3.9 mmol/L (ref 3.5–5.1)
Sodium: 144 mmol/L (ref 135–145)
Total Bilirubin: 0.8 mg/dL (ref 0.0–1.2)
Total Protein: 7.2 g/dL (ref 6.5–8.1)

## 2024-02-29 LAB — CBC
HCT: 46.7 % (ref 39.0–52.0)
Hemoglobin: 15.8 g/dL (ref 13.0–17.0)
MCH: 32.2 pg (ref 26.0–34.0)
MCHC: 33.8 g/dL (ref 30.0–36.0)
MCV: 95.3 fL (ref 80.0–100.0)
Platelets: 280 K/uL (ref 150–400)
RBC: 4.9 MIL/uL (ref 4.22–5.81)
RDW: 14.2 % (ref 11.5–15.5)
WBC: 7.1 K/uL (ref 4.0–10.5)
nRBC: 0 % (ref 0.0–0.2)

## 2024-02-29 MED ORDER — PHENOBARBITAL 32.4 MG PO TABS
32.4000 mg | ORAL_TABLET | Freq: Two times a day (BID) | ORAL | Status: AC
  Administered 2024-02-29: 32.4 mg via ORAL
  Filled 2024-02-29: qty 1

## 2024-02-29 MED ORDER — ASPIRIN 81 MG PO TBEC
81.0000 mg | DELAYED_RELEASE_TABLET | Freq: Every day | ORAL | Status: DC
  Administered 2024-03-01: 81 mg via ORAL
  Filled 2024-02-29: qty 1

## 2024-02-29 MED ORDER — ASPIRIN 81 MG PO CHEW
81.0000 mg | CHEWABLE_TABLET | Freq: Every day | ORAL | Status: DC
  Filled 2024-02-29: qty 1

## 2024-02-29 MED ORDER — CLOPIDOGREL BISULFATE 75 MG PO TABS
75.0000 mg | ORAL_TABLET | Freq: Every day | ORAL | Status: DC
  Administered 2024-02-29 – 2024-03-01 (×2): 75 mg via ORAL
  Filled 2024-02-29 (×2): qty 1

## 2024-02-29 NOTE — Progress Notes (Addendum)
 STROKE TEAM PROGRESS NOTE   SIGNIFICANT HOSPITAL EVENTS 10/3 code stroke for left gaze and right hemiplegia received IV TNK and underwent left carotid terminus and M1 occlusion with TICI 3.  Post IR MRI showed large subacute left MCA infarct. MRA showed left ICA still occluded but left MCA patent.  UDS positive for cocaine and THC.  Hospitalization complicated by severe alcohol withdrawal, now slowly improving on phenobarbital taper. Patient was discharged to CIR 10/10 on aspirin  81 and Lipitor  80 and Zetia  10.   10/11: working with PT/OT, became unresponsive, loss of consciousness.  BP at that time 50 to 60s.  Put him back in bed, BP improved to 80s and then 100s, patient slowly improved with mental status and muscle strength however weaker than baseline.  CT head stat ordered as well as EEG ordered.  Patient transferred back to acute hospital for evaluation.  Currently, patient mental status much improved, near baseline now    INTERIM HISTORY/SUBJECTIVE  CTH and EEG negative. Yesterday's episode likely due to hypotension/syncope.   No family at bedside.  Patient uncooperative with most of neuroexam..  He is moving right arm some.  Dysarthric speech continues.   CBC    Component Value Date/Time   WBC 7.1 02/29/2024 0556   RBC 4.90 02/29/2024 0556   HGB 15.8 02/29/2024 0556   HGB 15.7 10/17/2016 1618   HCT 46.7 02/29/2024 0556   HCT 46.9 10/17/2016 1618   PLT 280 02/29/2024 0556   PLT 241 10/17/2016 1618   MCV 95.3 02/29/2024 0556   MCV 95 10/17/2016 1618   MCH 32.2 02/29/2024 0556   MCHC 33.8 02/29/2024 0556   RDW 14.2 02/29/2024 0556   RDW 15.6 (H) 10/17/2016 1618   LYMPHSABS 1.2 02/28/2024 0648   MONOABS 0.5 02/28/2024 0648   EOSABS 0.1 02/28/2024 0648   BASOSABS 0.0 02/28/2024 0648    BMET    Component Value Date/Time   NA 144 02/29/2024 0556   NA 144 10/17/2016 1618   K 3.9 02/29/2024 0556   CL 107 02/29/2024 0556   CO2 21 (L) 02/29/2024 0556   GLUCOSE 94  02/29/2024 0556   BUN 14 02/29/2024 0556   BUN 14 10/17/2016 1618   CREATININE 1.13 02/29/2024 0556   CREATININE 1.16 07/22/2023 1434   CALCIUM  8.9 02/29/2024 0556   EGFR 70 07/22/2023 1434   GFRNONAA >60 02/29/2024 0556   GFRNONAA 88 06/21/2013 1559    IMAGING past 24 hours EEG adult Result Date: 02/28/2024 Michaela Aisha SQUIBB, MD     02/28/2024  6:36 PM History: 63 yo M being evaluated for loss of conciousness with previous strokes, evaluating for seizure predisposition EEG Duration: 30 minutes Sedation: none Patient State: Awake and drowsy Technique: This EEG was acquired with electrodes placed according to the International 10-20 electrode system (including Fp1, Fp2, F3, F4, C3, C4, P3, P4, O1, O2, T3, T4, T5, T6, A1, A2, Fz, Cz, Pz). The following electrodes were missing or displaced: none. Background: The background consists of intermixed alpha and frontally predominant beta activities.  The posterior dominant rhythm is low voltage, but is seen with a frequency of 9 Hz.  There is anterior shifting of the PDR with drowsiness, but sleep is not recorded. Photic stimulation: Physiologic driving is not performed EEG Abnormalities: 1) low voltage PDR Clinical Interpretation: This EEG revealed evidence of bilateral posterior quadrant dysfunction, consistent with the patient's known strokes. There was no seizure or seizure predisposition recorded on this study. Please note that lack of  epileptiform activity on EEG does not preclude the possibility of epilepsy. Aisha Seals, MD [facility] If 7pm- 7am, please page neurology on call as listed in AMION.  CT HEAD WO CONTRAST ( ) Result Date: 02/28/2024 EXAM: CT HEAD WITHOUT CONTRAST 02/28/2024 02:53:24 PM TECHNIQUE: CT of the head was performed without the administration of intravenous contrast. Automated exposure control, iterative reconstruction, and/or weight based adjustment of the mA/kV was utilized to reduce the radiation  dose to as low as reasonably achievable. COMPARISON: MRI head without contrast 02/22/2024. CLINICAL HISTORY: Stroke, follow up. FINDINGS: BRAIN AND VENTRICLES: Expected evolution of the large left MCA territory infarct is noted. No acute hemorrhage is present. Remote infarcts involving the posterior right frontal lobe, right parietal lobe, and right occipital lobe are again noted. Remote lacunar infarcts of the right basal ganglia are stable. Atherosclerotic calcifications are present in the cavernous carotid arteries bilaterally. No hyperdense vessel is present. No evidence of acute infarct. No hydrocephalus. No extra-axial collection. No mass effect or midline shift. ORBITS: No acute abnormality. SINUSES: No acute abnormality. SOFT TISSUES AND SKULL: No acute soft tissue abnormality. No skull fracture. IMPRESSION: 1. No acute intracranial abnormality. 2. Expected evolution of the large left MCA territory infarct. 3. Remote infarcts involving the posterior right frontal lobe, right parietal lobe, and right occipital lobe. 4. Stable remote lacunar infarcts of the right basal ganglia. 5. Atherosclerotic calcifications in the cavernous carotid arteries bilaterally. No hyperdense vessel. Electronically signed by: Lonni Necessary MD 02/28/2024 04:00 PM EDT RP Workstation: [id number]    Vitals:   02/29/24 0028 02/29/24 0416 02/29/24 0756 02/29/24 1100  BP: 107/83 123/68 106/80 133/79  Pulse: (!) 53 (!) 51 (!) 56 66  Resp: 16 14 15 15   Temp:   98.1 F (36.7 C) 97.9 F (36.6 C)  TempSrc:   Axillary Axillary  SpO2:   97% 96%  Weight:      Height:         PHYSICAL EXAM General: Critically ill African-American male Psych:  Mood and affect appropriate for situation CV: Regular rate and rhythm on monitor Respiratory:  Regular, unlabored respirations on room air GI: Abdomen soft and nontender     NEURO:  Mental Status: lethargic, arouses he is globally aphasic-dysarthric.  Able to follow some  commands with cues slightly delayed   Cranial Nerves:  II: PERRL. Visual fields no blink to threat on right III, IV, VI: Left gaze preference can cross midline V: Sensation is intact to light touch and symmetrical to face.  VII: Face is asymmetric slight facial droop VIII: hearing intact to voice. IX, X: Palate elevates symmetrically. Phonation is normal.  KP:Dynloizm shrug 5/5. XII: tongue is midline without fasciculations. Motor: He is purposeful on the left, and antigravity with left arm and leg, right arm is antigravity, right leg with no movement. Tone: is normal and bulk is normal Sensation-decreased on right Coordination: Unable to assess Gait- deferred   Most Recent NIH18     ASSESSMENT/PLAN   Mr. BECK COFER is a 63 y.o. male with history of multiple prior strokes (R sided, with residual mild L hemiplegia), prostate cancer (s/p brachytherapy, 2018), polysubstance use (marijuana, crack, tobacco, alcohol), HTN, depression, BPH who presents as a code stroke for left gaze & right hemiplegia.  NIH on Admission 25   Stroke:  left MCA infarct with left carotid terminus and M1 occlusion s/p IR and TNK with TICI 3, etiology: Large vessel disease, uncontrolled risk factors Code Stroke  CT head  Acute/early subacute left MCA territory infarct with focal loss of gray-white differentiation (ASPECTS 8) and hyperdense left MCA. Chronic infarcts in the right MCA and PCA territories  CTA head & neck large vessel occlusion of the left MCA, left ICA narrowing at the P3 segment region occlusion at the distal cavernous segment and reconstituted at supraclinoid portion. Moderate stenosis right M1, mild stenosis bilateral PCA.   MRI large subacute left MCA infarct with small petechial hemorrhage MRA left ICA still occluded but left MCA patent Repeat CTH 10/11: No acute intracranial abnormality.  Expected evolution of large left MCA territory infarct.  Remote infarcts involving posterior right  frontal, right parietal and right occipital lobe.  Stable remote lacunar infarcts right basal ganglia. EEG evidence of bilateral posterior quadrant dysfunction, consistent with the patient's known strokes. There was no seizure or seizure predisposition recorded on this study.  2D Echo EF 60 to 65%. LDL 111 HgbA1c 6.1 UDS positive for cocaine and THC VTE prophylaxis - lovenox   No antithrombotic prior to admission, now on aspirin  81 mg daily and plavix  for 3 months given left ICA occlusion, and then ASA alone Therapy recommendations:  CIR Disposition: Pending   Syncope 10/11 episode of syncope in CIR, likely due to orthostatic hypotension Now back to baseline Avoid low BP Check orthostatic vitals before therapy session TED ho recommendedse    Hypertension Home meds: Amlodipine  10 mg, lisinopril  40 mg-appears patient is noncompliant and was not taking medication Stable Norvasc  and lisinopril  on hold for now Long-term BP goal normotensive Avoid hypotension due to chronic occlusion    Hyperlipidemia Home meds: Zetia  10 mg and atorvastatin  80 mg,  resumed in hospital-patient noted to be noncompliant with medication LDL 111, goal < 70 Now on home Lipitor  and Zetia  Continue statin and Zetia  at discharge   Tobacco Abuse Patient smokes cigarettes daily Assess willingness to quit when able Nicotine replacement therapy provided   Substance Abuse EtOH abuse Patient uses THC, cocaine UDS positive for THC and Cocaine ETOH use, alcohol level 130, advised to drink no more than 2 drink(s) a day Drinks 6 beers a day CIWA protocol, on phenobarbital taper Thiamine , folate, multivitamin TOC consult for cessation placed   Dysphagia Patient has post-stroke dysphagia, SLP consulted Now on Dysphagia 3 Advance diet once able  Other Stroke Risk Factors Advanced age Chronic headaches History of prostate cancer Hx of Stroke/TIA Chronic infarcts in the right MCA and PCA territories on CT and  MRI   Other Active Problems Depression-Home bupropion  200 mg twice daily BPH-on Flomax  COPD    Neurology will sign off.  No further neurological testing needed at this time.  Continue secondary stroke prevention with aspirin  81 mg and statins daily.  Pt seen by Neuro NP/APP and later by MD. Note/plan to be edited by MD as needed.    Rocky JAYSON Likes, DNP [facility] Please use AMION for contact information & EPIC for messaging.  ATTENDING NOTE: I reviewed above note and agree with the assessment and plan. Pt was seen and examined.   No family at bedside.  Patient lying bed, moderate to severe dysarthria, requested to go home.  Neuro at baseline.  BP stable, continue DAPT and statin with Zetia .  Continue PT and OT.  Risk factor modification again educated.   For detailed assessment and plan, please refer to above as I have made changes wherever appropriate.   Neurology will sign off. Please call with questions. Pt will follow up with Dr Leigh at [facility]  in about 4 weeks. Thanks for the consult.   Ary Cummins, MD PhD Stroke Neurology 02/29/2024 7:43 PM    To contact Stroke Continuity provider, please refer to [email or web address]. After hours, contact General Neurology

## 2024-02-29 NOTE — Progress Notes (Signed)
 Occupational Therapy Discharge Note  This patient was unable to complete the inpatient rehab program due to medical issues ; therefore did not meet their long term goals. Pt left the program at a mod-max assist level for their  functional ADLs. This patient is being discharged from OT services at this time.  BIMS at time of d/c  Pt unable to complete due to medical status  See CareTool for functional status details.  If the patient is able to return to inpatient rehabilitation within 3 midnights, this may be considered an interrupted stay and therapy services will resume as ordered. Modification and reinstatement of their goals will be made upon completion of therapy service reevaluations.

## 2024-02-29 NOTE — Progress Notes (Signed)
 PROGRESS NOTE    Frank Fischer  FMW:[id number] DOB: 17-Jul-1960 DOA: 02/28/2024 PCP: Leonarda Roxan BROCKS, NP   63 y.o. 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 Subsequently treated with TNK and thrombectomy was transition to progressive care. Hospitalization complicated by severe alcohol withdrawal, initially treated with Librium followed by phenobarbital, now slowly improving on phenobarbital taper. He was discharged to CIR yesterday, today when physical therapy tried to get him up he became unresponsive, systolic blood pressure dropped to the 50s/60s range, improved to low 100 after he was put in Trendelenburg position, slowly improved, mental status improving however does have aphasia/dysarthria from his current stroke   Subjective:   Assessment and Plan:   Syncope -Suspect syncopal event related to orthostatic hypotension, BP dropped to 50s when he got up with PT yesterday - Neurology consulting, repeat CT stable, EEG negative - He is on a phenobarbital taper for EtOH withdrawal, does not appear to be acutely withdrawing at this time,  day 8 of hospitalization today -Will complete phenobarb dose today - Stopped IV fluid, discontinued lisinopril  and amlodipine  -Repeat orthostatics are better - PT OT eval, ?does he need to go back to CIR   Left acute MCA stroke -With right hemiplegia, dysarthria, aphasia - Treated with mechanical thrombectomy and tPA for large vessel stroke - MRI noted large left MCA infarct - Echo noted preserved EF, LDL 111, A1c 6.1 -Continue aspirin  and statin   Alcohol and polysubstance abuse - EtOH level was 130 on admission, positive for cocaine and cannabis as well -Per spouse drinks 6-8 beers per day -  Counseled, continue thiamine , multivitamins, complete phenobarbital taper today   COPD Tobacco abuse - Counseled, albuterol  as needed   Hypertension - See discussion above, holding lisinopril  and amlodipine    BPH Continue Flomax    Depression Continue bupropion    DVT prophylaxis: Lovenox  Code Status: Full code Family Communication: Discussed with wife at bedside Disposition Plan: To be determined, await PT OT review reeval Consultants:    Procedures:   Antimicrobials:    Objective: Vitals:   02/29/24 0028 02/29/24 0416 02/29/24 0756 02/29/24 1100  BP: 107/83 123/68 106/80 133/79  Pulse: (!) 53 (!) 51 (!) 56 66  Resp: 16 14 15 15   Temp:   98.1 F (36.7 C) 97.9 F (36.6 C)  TempSrc:   Axillary Axillary  SpO2:   97% 96%  Weight:      Height:        Intake/Output Summary (Last 24 hours) at 02/29/2024 1130 Last data filed at 02/29/2024 1100 Gross per 24 hour  Intake 80 ml  Output 300 ml  Net -220 ml   Filed Weights   02/28/24 1744  Weight: 84 kg    Examination:  General exam: Appears calm and comfortable awake alert, oriented to self and partly to place, has moderate cognitive deficits and dysarthria Respiratory system: Clear to auscultation Cardiovascular system: S1 & S2 heard, RRR.  Abd: nondistended, soft and nontender.Normal bowel sounds heard. Central nervous system: Awake, cognitive deficits, dysarthria, right hemiplegia Skin: No rashes Psychiatry: Flat affect    Data Reviewed:   CBC: Recent Labs  Lab 02/23/24 0646 02/26/24 0425 02/28/24 0648 02/29/24 0556  WBC 5.9 6.2 5.9 7.1  NEUTROABS 4.1  --  4.0  --   HGB 13.6 14.2 16.1 15.8  HCT 39.7 42.6 48.2 46.7  MCV 95.7 95.3 95.6 95.3  PLT 199 281 282 280   Basic Metabolic Panel: Recent Labs  Lab 02/23/24 0646 02/26/24 0425 02/28/24 0648 02/29/24 0556  NA 136 139 140 144  K 3.4* 3.5 4.0 3.9  CL 104 104 105 107  CO2 21* 22 21* 21*  GLUCOSE 99 94 101* 94  BUN 7* 9 12 14    CREATININE 1.04 1.15 1.03 1.13  CALCIUM  8.3* 8.8* 9.1 8.9  MG  --  2.1  --   --   PHOS  --  4.8*  --   --    GFR: Estimated Creatinine Clearance: 67.6 mL/min (by C-G formula based on SCr of 1.13 mg/dL). Liver Function Tests: Recent Labs  Lab 02/28/24 0648 02/29/24 0556  AST 32 33  ALT 28 29  ALKPHOS 105 110  BILITOT 0.8 0.8  PROT 7.2 7.2  ALBUMIN 3.7 3.6   No results for input(s): LIPASE, AMYLASE in the last 168 hours. No results for input(s): AMMONIA in the last 168 hours. Coagulation Profile: No results for input(s): INR, PROTIME in the last 168 hours. Cardiac Enzymes: No results for input(s): CKTOTAL, CKMB, CKMBINDEX, TROPONINI in the last 168 hours. BNP (last 3 results) No results for input(s): PROBNP in the last 8760 hours. HbA1C: No results for input(s): HGBA1C in the last 72 hours. CBG: Recent Labs  Lab 02/22/24 1548 02/22/24 2016 02/28/24 1205  GLUCAP 111* 113* 101*   Lipid Profile: No results for input(s): CHOL, HDL, LDLCALC, TRIG, CHOLHDL, LDLDIRECT in the last 72 hours. Thyroid Function Tests: No results for input(s): TSH, T4TOTAL, FREET4, T3FREE, THYROIDAB in the last 72 hours. Anemia Panel: No results for input(s): VITAMINB12, FOLATE, FERRITIN, TIBC, IRON, RETICCTPCT in the last 72 hours. Urine analysis:    Component Value Date/Time   COLORURINE YELLOW 01/10/2024 1046   APPEARANCEUR HAZY (A) 01/10/2024 1046   LABSPEC 1.015 01/10/2024 1046   PHURINE 5.0 01/10/2024 1046   GLUCOSEU NEGATIVE 01/10/2024 1046   HGBUR MODERATE (A) 01/10/2024 1046   BILIRUBINUR NEGATIVE 01/10/2024 1046   BILIRUBINUR negative 07/31/2022 0000   KETONESUR NEGATIVE 01/10/2024 1046   PROTEINUR NEGATIVE 01/10/2024 1046   UROBILINOGEN 0.2 07/31/2022 0000   UROBILINOGEN 0.2 04/30/2013 1111   NITRITE POSITIVE (A) 01/10/2024 1046   LEUKOCYTESUR LARGE (A) 01/10/2024 1046   Sepsis  Labs: @LABRCNTIP (procalcitonin:4,lacticidven:4)  ) Recent Results (from the past 240 hours)  Resp panel by RT-PCR (RSV, Flu A&B, Covid) Anterior Nasal Swab     Status: None   Collection Time: 02/21/24  1:11 AM   Specimen: Anterior Nasal Swab  Result Value Ref Range Status   SARS Coronavirus 2 by RT PCR NEGATIVE NEGATIVE Final   Influenza A by PCR NEGATIVE NEGATIVE Final   Influenza B by PCR NEGATIVE NEGATIVE Final    Comment: (NOTE) The Xpert Xpress SARS-CoV-2/FLU/RSV plus assay is intended as an aid in the diagnosis of influenza from Nasopharyngeal swab specimens and should not be used as a sole basis for treatment. Nasal washings and aspirates are unacceptable for Xpert Xpress SARS-CoV-2/FLU/RSV testing.  Fact Sheet for Patients: BloggerCourse.com  Fact Sheet for Healthcare Providers: SeriousBroker.it  This test is not yet approved or cleared by the [place]  FDA and has been authorized for detection and/or diagnosis of SARS-CoV-2 by FDA under an Emergency Use Authorization (EUA). This EUA will remain in effect (meaning this test can be used) for the duration of the COVID-19 declaration under Section 564(b)(1) of the Act, 21  U.S.C. section 360bbb-3(b)(1), unless the authorization is terminated or revoked.     Resp Syncytial Virus by PCR NEGATIVE NEGATIVE Final    Comment: (NOTE) Fact Sheet for Patients: BloggerCourse.com  Fact Sheet for Healthcare Providers: SeriousBroker.it  This test is not yet approved or cleared by the [place]  FDA and has been authorized for detection and/or diagnosis of SARS-CoV-2 by FDA under an Emergency Use Authorization (EUA). This EUA will remain in effect (meaning this test can be used) for the duration of the COVID-19 declaration under Section 564(b)(1) of the Act, 21 U.S.C. section 360bbb-3(b)(1), unless the authorization is  terminated or revoked.  Performed at [facility], [address], [place], [place] [address]   MRSA Next Gen by PCR, Nasal     Status: None   Collection Time: 02/21/24  6:14 AM   Specimen: Nasal Swab  Result Value Ref Range Status   MRSA by PCR Next Gen NOT DETECTED NOT DETECTED Final    Comment: (NOTE) The GeneXpert MRSA Assay (FDA approved for NASAL specimens only), is one component of a comprehensive MRSA colonization surveillance program. It is not intended to diagnose MRSA infection nor to guide or monitor treatment for MRSA infections. Test performance is not FDA approved in patients less than 80 years old. Performed at [facility], [address] [place], [place] [address]      Radiology Studies: EEG adult Result Date: 02/28/2024 Michaela Aisha SQUIBB, MD     02/28/2024  6:36 PM History: 63 yo M being evaluated for loss of conciousness with previous strokes, evaluating for seizure predisposition EEG Duration: 30 minutes Sedation: none Patient State: Awake and drowsy Technique: This EEG was acquired with electrodes placed according to the International 10-20 electrode system (including Fp1, Fp2, F3, F4, C3, C4, P3, P4, O1, O2, T3, T4, T5, T6, A1, A2, Fz, Cz, Pz). The following electrodes were missing or displaced: none. Background: The background consists of intermixed alpha and frontally predominant beta activities.  The posterior dominant rhythm is low voltage, but is seen with a frequency of 9 Hz.  There is anterior shifting of the PDR with drowsiness, but sleep is not recorded. Photic stimulation: Physiologic driving is not performed EEG Abnormalities: 1) low voltage PDR Clinical Interpretation: This EEG revealed evidence of bilateral posterior quadrant dysfunction, consistent with the patient's known strokes. There was no seizure or seizure predisposition recorded on this study. Please note that lack of epileptiform activity on EEG does not preclude the possibility  of epilepsy. Aisha Michaela, MD [facility] If 7pm- 7am, please page neurology on call as listed in AMION.  CT HEAD WO CONTRAST ( ) Result Date: 02/28/2024 EXAM: CT HEAD WITHOUT CONTRAST 02/28/2024 02:53:24 PM TECHNIQUE: CT of the head was performed without the administration of intravenous contrast. Automated exposure control, iterative reconstruction, and/or weight based adjustment of the mA/kV was utilized to reduce the radiation dose to as low as reasonably achievable. COMPARISON: MRI head without contrast 02/22/2024. CLINICAL HISTORY: Stroke, follow up. FINDINGS: BRAIN AND VENTRICLES: Expected evolution of the large left MCA territory infarct is noted. No acute hemorrhage is present. Remote infarcts involving the posterior right frontal lobe, right parietal lobe, and right occipital lobe are again noted. Remote lacunar infarcts of the right basal ganglia are stable. Atherosclerotic calcifications are present in the cavernous carotid arteries bilaterally. No hyperdense vessel is present. No evidence of acute infarct. No hydrocephalus. No extra-axial collection. No mass effect or midline shift. ORBITS: No acute abnormality. SINUSES: No  acute abnormality. SOFT TISSUES AND SKULL: No acute soft tissue abnormality. No skull fracture. IMPRESSION: 1. No acute intracranial abnormality. 2. Expected evolution of the large left MCA territory infarct. 3. Remote infarcts involving the posterior right frontal lobe, right parietal lobe, and right occipital lobe. 4. Stable remote lacunar infarcts of the right basal ganglia. 5. Atherosclerotic calcifications in the cavernous carotid arteries bilaterally. No hyperdense vessel. Electronically signed by: Lonni Necessary MD 02/28/2024 04:00 PM EDT RP Workstation: [id number]     Scheduled Meds:  aspirin  EC  81 mg Oral Daily   atorvastatin   80 mg Oral Daily   enoxaparin  (LOVENOX ) injection  40 mg Subcutaneous Q24H   ezetimibe   10 mg Oral Daily    folic acid   1 mg Oral Daily   multivitamin with minerals  1 tablet Oral Daily   nicotine  7 mg Transdermal Daily   phenobarbital  32.4 mg Oral BID   tamsulosin   0.4 mg Oral Daily   thiamine   100 mg Oral Daily   Continuous Infusions:   LOS: 1 day    Time spent:    Sigurd Pac, MD [facility]   02/29/2024, 11:30 AM

## 2024-03-01 ENCOUNTER — Inpatient Hospital Stay (HOSPITAL_REHABILITATION)
Admission: AD | Admit: 2024-03-01 | Discharge: 2024-03-12 | Disposition: A | Discharge status: Home Health | Source: Intra-hospital | Attending: Physical Medicine & Rehabilitation | Admitting: Physical Medicine & Rehabilitation

## 2024-03-01 DIAGNOSIS — F09 Unspecified mental disorder due to known physiological condition: Secondary | ICD-10-CM

## 2024-03-01 DIAGNOSIS — E876 Hypokalemia: Secondary | ICD-10-CM

## 2024-03-01 DIAGNOSIS — F1910 Other psychoactive substance abuse, uncomplicated: Secondary | ICD-10-CM | POA: Diagnosis present

## 2024-03-01 DIAGNOSIS — I639 Cerebral infarction, unspecified: Principal | ICD-10-CM | POA: Diagnosis present

## 2024-03-01 DIAGNOSIS — I10 Essential (primary) hypertension: Secondary | ICD-10-CM | POA: Diagnosis present

## 2024-03-01 DIAGNOSIS — F17200 Nicotine dependence, unspecified, uncomplicated: Secondary | ICD-10-CM | POA: Diagnosis present

## 2024-03-01 DIAGNOSIS — M069 Rheumatoid arthritis, unspecified: Secondary | ICD-10-CM | POA: Diagnosis present

## 2024-03-01 DIAGNOSIS — J4530 Mild persistent asthma, uncomplicated: Secondary | ICD-10-CM | POA: Diagnosis present

## 2024-03-01 DIAGNOSIS — E559 Vitamin D deficiency, unspecified: Secondary | ICD-10-CM | POA: Diagnosis present

## 2024-03-01 DIAGNOSIS — I119 Hypertensive heart disease without heart failure: Secondary | ICD-10-CM | POA: Diagnosis present

## 2024-03-01 DIAGNOSIS — M25519 Pain in unspecified shoulder: Secondary | ICD-10-CM | POA: Diagnosis present

## 2024-03-01 DIAGNOSIS — I63512 Cerebral infarction due to unspecified occlusion or stenosis of left middle cerebral artery: Secondary | ICD-10-CM

## 2024-03-01 DIAGNOSIS — F1010 Alcohol abuse, uncomplicated: Secondary | ICD-10-CM

## 2024-03-01 DIAGNOSIS — E785 Hyperlipidemia, unspecified: Secondary | ICD-10-CM | POA: Diagnosis present

## 2024-03-01 DIAGNOSIS — E782 Mixed hyperlipidemia: Secondary | ICD-10-CM | POA: Diagnosis present

## 2024-03-01 DIAGNOSIS — R55 Syncope and collapse: Secondary | ICD-10-CM

## 2024-03-01 DIAGNOSIS — R7989 Other specified abnormal findings of blood chemistry: Secondary | ICD-10-CM

## 2024-03-01 LAB — BASIC METABOLIC PANEL WITH GFR
Anion gap: 15 (ref 5–15)
BUN: 15 mg/dL (ref 8–23)
CO2: 18 mmol/L — ABNORMAL LOW (ref 22–32)
Calcium: 8.8 mg/dL — ABNORMAL LOW (ref 8.9–10.3)
Chloride: 105 mmol/L (ref 98–111)
Creatinine, Ser: 0.99 mg/dL (ref 0.61–1.24)
GFR, Estimated: >60 mL/min (ref >60)
Glucose, Bld: 87 mg/dL (ref 70–99)
Potassium: 3.9 mmol/L (ref 3.5–5.1)
Sodium: 138 mmol/L (ref 135–145)

## 2024-03-01 LAB — CBC
HCT: 49.3 % (ref 39.0–52.0)
Hemoglobin: 16.1 g/dL (ref 13.0–17.0)
MCH: 32.4 pg (ref 26.0–34.0)
MCHC: 32.7 g/dL (ref 30.0–36.0)
MCV: 99.2 fL (ref 80.0–100.0)
Platelets: 266 K/uL (ref 150–400)
RBC: 4.97 MIL/uL (ref 4.22–5.81)
RDW: 14.2 % (ref 11.5–15.5)
WBC: 7.1 K/uL (ref 4.0–10.5)
nRBC: 0 % (ref 0.0–0.2)

## 2024-03-01 LAB — MAGNESIUM: Magnesium: 2.5 mg/dL — ABNORMAL HIGH (ref 1.7–2.4)

## 2024-03-01 MED ORDER — LISINOPRIL 10 MG PO TABS
10.0000 mg | ORAL_TABLET | Freq: Every day | ORAL | Status: DC
  Administered 2024-03-01: 10 mg via ORAL
  Filled 2024-03-01: qty 1

## 2024-03-01 MED ORDER — ADULT MULTIVITAMIN W/MINERALS CH
1.0000 | ORAL_TABLET | Freq: Every day | ORAL | Status: DC
  Administered 2024-03-02 – 2024-03-12 (×10): 1 via ORAL
  Filled 2024-03-01 (×11): qty 1

## 2024-03-01 MED ORDER — ALBUTEROL SULFATE (2.5 MG/3ML) 0.083% IN NEBU: 3.0000 mL | INHALATION_SOLUTION | Freq: Four times a day (QID) | RESPIRATORY_TRACT | Status: DC | PRN

## 2024-03-01 MED ORDER — THIAMINE MONONITRATE 100 MG PO TABS
100.0000 mg | ORAL_TABLET | Freq: Every day | ORAL | Status: DC
  Administered 2024-03-02 – 2024-03-12 (×10): 100 mg via ORAL
  Filled 2024-03-01 (×11): qty 1

## 2024-03-01 MED ORDER — TRAZODONE HCL 50 MG PO TABS
25.0000 mg | ORAL_TABLET | Freq: Every evening | ORAL | Status: DC | PRN
  Administered 2024-03-02: 50 mg via ORAL
  Filled 2024-03-01: qty 1

## 2024-03-01 MED ORDER — DIPHENHYDRAMINE HCL 25 MG PO CAPS: 25.0000 mg | ORAL_CAPSULE | Freq: Four times a day (QID) | ORAL | Status: DC | PRN

## 2024-03-01 MED ORDER — ACETAMINOPHEN 325 MG PO TABS: 325.0000 mg | ORAL_TABLET | ORAL | Status: DC | PRN

## 2024-03-01 MED ORDER — ONDANSETRON HCL 4 MG/2ML IJ SOLN: 4.0000 mg | Freq: Four times a day (QID) | INTRAMUSCULAR | Status: DC | PRN

## 2024-03-01 MED ORDER — PROCHLORPERAZINE MALEATE 5 MG PO TABS: 5.0000 mg | ORAL_TABLET | Freq: Four times a day (QID) | ORAL | Status: DC | PRN

## 2024-03-01 MED ORDER — ALUM & MAG HYDROXIDE-SIMETH 200-200-20 MG/5ML PO SUSP: 30.0000 mL | ORAL | Status: DC | PRN

## 2024-03-01 MED ORDER — GUAIFENESIN-DM 100-10 MG/5ML PO SYRP: 5.0000 mL | ORAL_SOLUTION | Freq: Four times a day (QID) | ORAL | Status: DC | PRN

## 2024-03-01 MED ORDER — CLOPIDOGREL BISULFATE 75 MG PO TABS: 75.0000 mg | ORAL_TABLET | Freq: Every day | ORAL | Status: DC

## 2024-03-01 MED ORDER — ENOXAPARIN SODIUM 40 MG/0.4ML IJ SOSY
40.0000 mg | PREFILLED_SYRINGE | INTRAMUSCULAR | Status: DC
  Administered 2024-03-02 – 2024-03-03 (×2): 40 mg via SUBCUTANEOUS
  Filled 2024-03-01 (×2): qty 0.4

## 2024-03-01 MED ORDER — ASPIRIN 81 MG PO TBEC
81.0000 mg | DELAYED_RELEASE_TABLET | Freq: Every day | ORAL | Status: DC
  Administered 2024-03-02 – 2024-03-12 (×10): 81 mg via ORAL
  Filled 2024-03-01 (×11): qty 1

## 2024-03-01 MED ORDER — FOLIC ACID 1 MG PO TABS
1.0000 mg | ORAL_TABLET | Freq: Every day | ORAL | Status: DC
  Administered 2024-03-02 – 2024-03-12 (×10): 1 mg via ORAL
  Filled 2024-03-01 (×10): qty 1

## 2024-03-01 MED ORDER — LISINOPRIL 10 MG PO TABS: 10.0000 mg | ORAL_TABLET | Freq: Every morning | ORAL | Status: DC

## 2024-03-01 MED ORDER — BISACODYL 10 MG RE SUPP: 10.0000 mg | Freq: Every day | RECTAL | Status: DC | PRN

## 2024-03-01 MED ORDER — TAMSULOSIN HCL 0.4 MG PO CAPS
0.4000 mg | ORAL_CAPSULE | Freq: Every day | ORAL | Status: DC
  Administered 2024-03-02 – 2024-03-12 (×10): 0.4 mg via ORAL
  Filled 2024-03-01 (×10): qty 1

## 2024-03-01 MED ORDER — EZETIMIBE 10 MG PO TABS
10.0000 mg | ORAL_TABLET | Freq: Every day | ORAL | Status: DC
  Administered 2024-03-02 – 2024-03-12 (×10): 10 mg via ORAL
  Filled 2024-03-01 (×11): qty 1

## 2024-03-01 MED ORDER — FLEET ENEMA RE ENEM: 1.0000 | ENEMA | Freq: Once | RECTAL | Status: DC | PRN

## 2024-03-01 MED ORDER — ONDANSETRON HCL 4 MG PO TABS: 4.0000 mg | ORAL_TABLET | Freq: Four times a day (QID) | ORAL | Status: DC | PRN

## 2024-03-01 MED ORDER — NICOTINE 7 MG/24HR TD PT24
7.0000 mg | MEDICATED_PATCH | Freq: Every day | TRANSDERMAL | Status: DC
  Administered 2024-03-02 – 2024-03-04 (×3): 7 mg via TRANSDERMAL
  Filled 2024-03-01 (×3): qty 1

## 2024-03-01 MED ORDER — CLOPIDOGREL BISULFATE 75 MG PO TABS
75.0000 mg | ORAL_TABLET | Freq: Every day | ORAL | Status: DC
  Administered 2024-03-02 – 2024-03-12 (×10): 75 mg via ORAL
  Filled 2024-03-01 (×10): qty 1

## 2024-03-01 MED ORDER — ATORVASTATIN CALCIUM 80 MG PO TABS
80.0000 mg | ORAL_TABLET | Freq: Every day | ORAL | Status: DC
  Administered 2024-03-02 – 2024-03-12 (×10): 80 mg via ORAL
  Filled 2024-03-01 (×12): qty 1

## 2024-03-01 MED ORDER — PROCHLORPERAZINE 25 MG RE SUPP: 12.5000 mg | Freq: Four times a day (QID) | RECTAL | Status: DC | PRN

## 2024-03-01 MED ORDER — PROCHLORPERAZINE EDISYLATE 10 MG/2ML IJ SOLN: 5.0000 mg | Freq: Four times a day (QID) | INTRAMUSCULAR | Status: DC | PRN

## 2024-03-01 MED ORDER — LISINOPRIL 10 MG PO TABS
10.0000 mg | ORAL_TABLET | Freq: Every day | ORAL | Status: DC
  Administered 2024-03-02 – 2024-03-08 (×6): 10 mg via ORAL
  Filled 2024-03-01 (×2): qty 1
  Filled 2024-03-01 (×2): qty 2
  Filled 2024-03-01: qty 1
  Filled 2024-03-01: qty 2

## 2024-03-01 NOTE — Plan of Care (Signed)
 episode likely due to hypotension/syncope, discharged to acute and unable to complete CIR program

## 2024-03-01 NOTE — Progress Notes (Signed)
 Inpatient Rehabilitation Care Coordinator Discharge Note   Patient Details  Name: ZAEDYN COVIN MRN: [id number] Date of Birth: 1961-01-27   Discharge location: transferred to acute due to medical issues  Length of Stay: 1 day  Discharge activity level: na  Home/community participation: na  Patient response un:Yzjouy Literacy - How often do you need to have someone help you when you read instructions, pamphlets, or other written material from your doctor or pharmacy?: Patient unable to respond  Patient response un:Dnrpjo Isolation - How often do you feel lonely or isolated from those around you?: Patient unable to respond  Services provided included: MD, RD, PT, OT, SLP, RN, CM, Pharmacy, SW  Financial Services:  Financial Services Utilized: Medicare    Choices offered to/list presented to: na  Follow-up services arranged:              Patient response to transportation need: Is the patient able to respond to transportation needs?: Other (comment) (unable to determine)       Patient/Family verbalized understanding of follow-up arrangements:  Other (Comment) (unable)  Individual responsible for coordination of the follow-up plan:    Confirmed correct DME delivered: [name] 03/01/2024    Comments (or additional information):Pt here on CIR for one day and transferred back to acute due to medical issues    Dontrail Blackwell, Asberry MATSU

## 2024-03-01 NOTE — Discharge Summary (Signed)
 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 Subsequently treated with TNK and thrombectomy was transition to progressive care. Hospitalization complicated by severe alcohol withdrawal, initially treated with Librium followed by phenobarbital, now slowly improving on phenobarbital taper. He was discharged to CIR yesterday, today when physical therapy tried to get him up he became unresponsive, systolic blood pressure dropped to the 50s/60s range, improved to low 100 after he was put in Trendelenburg position, slowly improved, mental status improving however does have aphasia/dysarthria from his current stroke   Hospital Course:   Syncope -Suspect syncopal event related to orthostatic hypotension, BP dropped to 50s when he got up with PT on the day of admission - Neurology consulting, repeat CT stable, EEG negative - He is on a phenobarbital taper for EtOH withdrawal, does not appear to be acutely withdrawing at this time,  day 8 of hospitalization today - Added  phenobarbital taper yesterday - Stopped IV fluid, discontinued lisinopril  and amlodipine  -Repeat orthostatics are better - CIR following, anticipate discharge back today to continue rehabilitation   Left acute MCA stroke -With right hemiplegia, dysarthria, aphasia - Treated with mechanical thrombectomy and tPA for large vessel stroke - MRI noted large left MCA infarct - Echo noted preserved EF, LDL 111, A1c 6.1 - Followed by neurology, recommended to continue aspirin  and Plavix  for 3 months given left ICA occlusion followed by aspirin  alone   Alcohol and polysubstance abuse - EtOH level was 130 on admission, positive for cocaine and cannabis as well -Per spouse drinks 6-8 beers per day - Counseled, continue thiamine , multivitamins, complete phenobarbital taper today   COPD Tobacco abuse - Counseled, albuterol  as needed   Hypertension - See discussion above, blood pressure trending up, restarted lisinopril  at a lower dose, amlodipine  discontinued, repeat orthostatics are improved   BPH Continue Flomax    Depression Continue bupropion   Discharge Exam: Vitals:   02/29/24 2105 03/01/24 0442  BP: (!) 154/84 (!) 156/74  Pulse: 70 (!) 49  Resp: 14 18  Temp:  97.7 F (36.5 C)  SpO2: 100% 94%   Gen: Awake, Alert, Oriented X1, difficult dysarthria, global aphasia HEENT: no JVD Lungs: Good air movement bilaterally, CTAB CVS: S1S2/RRR Abd: soft, Non tender, non distended, BS present Extremities: No edema Skin: no new rashes on exposed skin  Neuro: Right hemiplegia  Discharge Instructions   Discharge Instructions     Ambulatory referral to Neurology   Complete by: As directed    Follow up with Dr. Leigh in 4-6 weeks. Pt is Dr. Loralee pt. Thanks.   Diet - low sodium heart healthy   Complete by: As directed    Increase activity slowly  Complete by: As directed       Allergies as of 03/01/2024       Reactions   Penicillins Itching        Medication List      STOP taking these medications    amLODipine  10 MG tablet Commonly known as: NORVASC    buPROPion  200 MG 12 hr tablet Commonly known as: WELLBUTRIN  SR   PHENobarbital 32.4 MG tablet Commonly known as: LUMINAL   triamcinolone  cream 0.1 % Commonly known as: KENALOG        TAKE these medications    albuterol  108 (90 Base) MCG/ACT inhaler Commonly known as: VENTOLIN  HFA Inhale 2 puffs into the lungs every 6 (six) hours as needed for wheezing or shortness of breath.   aspirin  EC 81 MG tablet Take 81 mg by mouth daily.   atorvastatin  80 MG tablet Commonly known as: LIPITOR  Take 1 tablet (80 mg total) by mouth daily.   clopidogrel  75 MG tablet Commonly known as: PLAVIX  Take 1 tablet (75 mg total) by mouth daily. Start taking on: March 02, 2024   ezetimibe  10 MG tablet Commonly known as: Zetia  Take 1 tablet (10 mg total) by mouth daily.   feeding supplement Liqd Take 237 mLs by mouth 2 (two) times daily between meals.   folic acid  1 MG tablet Commonly known as: FOLVITE  Take 1 tablet (1 mg total) by mouth daily.   lisinopril  10 MG tablet Commonly known as: ZESTRIL  Take 1 tablet (10 mg total) by mouth every morning. What changed:  medication strength how much to take   multivitamin with minerals Tabs tablet Take 1 tablet by mouth daily.   nicotine 7 mg/24hr patch Commonly known as: NICODERM CQ - dosed in mg/24 hr Place 1 patch (7 mg total) onto the skin daily.   tamsulosin  0.4 MG Caps capsule Commonly known as: FLOMAX  Take 1 capsule (0.4 mg total) by mouth daily.   thiamine  100 MG tablet Commonly known as: Vitamin B-1 Take 1 tablet (100 mg total) by mouth daily.       Allergies  Allergen Reactions   Penicillins Itching    Follow-up Information     Leigh Venetia CROME, MD. Schedule an appointment as soon as possible for a visit in 1 month(s).   Specialty: Neurology Contact information: [address] [place] [address] [place]  [address] [phone]                  The results of significant diagnostics from this hospitalization (including imaging, microbiology, ancillary and laboratory) are listed below for reference.    Significant Diagnostic Studies: EEG adult Result Date: 02/28/2024 Michaela Aisha SQUIBB, MD     02/28/2024  6:36 PM History: 62 yo M being evaluated for loss of conciousness with previous strokes, evaluating for seizure predisposition EEG Duration: 30 minutes Sedation: none Patient State: Awake and drowsy Technique: This EEG was acquired with electrodes placed according to the International 10-20 electrode system (including Fp1, Fp2, F3, F4, C3, C4, P3, P4, O1, O2, T3, T4, T5, T6, A1, A2, Fz, Cz, Pz). The following electrodes were missing or displaced: none. Background: The background consists of intermixed alpha and frontally predominant beta activities.  The posterior dominant rhythm is low voltage, but is seen with a frequency of 9 Hz.  There is anterior shifting of the PDR with drowsiness, but sleep is not recorded. Photic stimulation: Physiologic driving is not performed EEG Abnormalities: 1) low voltage PDR Clinical Interpretation: This EEG revealed evidence of bilateral  posterior quadrant dysfunction, consistent with the patient's known strokes. There was no seizure or seizure predisposition recorded on this study. Please note that lack of epileptiform activity on EEG does not preclude the possibility of epilepsy. Aisha Seals, MD [facility] If 7pm- 7am, please page neurology on call as listed in AMION.  CT HEAD WO CONTRAST ( ) Result Date: 02/28/2024 EXAM: CT HEAD WITHOUT CONTRAST 02/28/2024 02:53:24 PM TECHNIQUE: CT of the head was performed without the administration of intravenous contrast. Automated exposure control, iterative reconstruction, and/or weight based adjustment of the mA/kV was utilized to reduce the radiation dose to as low as reasonably achievable.  COMPARISON: MRI head without contrast 02/22/2024. CLINICAL HISTORY: Stroke, follow up. FINDINGS: BRAIN AND VENTRICLES: Expected evolution of the large left MCA territory infarct is noted. No acute hemorrhage is present. Remote infarcts involving the posterior right frontal lobe, right parietal lobe, and right occipital lobe are again noted. Remote lacunar infarcts of the right basal ganglia are stable. Atherosclerotic calcifications are present in the cavernous carotid arteries bilaterally. No hyperdense vessel is present. No evidence of acute infarct. No hydrocephalus. No extra-axial collection. No mass effect or midline shift. ORBITS: No acute abnormality. SINUSES: No acute abnormality. SOFT TISSUES AND SKULL: No acute soft tissue abnormality. No skull fracture. IMPRESSION: 1. No acute intracranial abnormality. 2. Expected evolution of the large left MCA territory infarct. 3. Remote infarcts involving the posterior right frontal lobe, right parietal lobe, and right occipital lobe. 4. Stable remote lacunar infarcts of the right basal ganglia. 5. Atherosclerotic calcifications in the cavernous carotid arteries bilaterally. No hyperdense vessel. Electronically signed by: Lonni Necessary MD 02/28/2024 04:00 PM EDT RP Workstation: [id number]   MR BRAIN WO CONTRAST Result Date: 02/22/2024 EXAM: MRI BRAIN WITHOUT CONTRAST MRA HEAD WITHOUT CONTRAST 02/22/2024 12:35:18 AM TECHNIQUE: Multiplanar multisequence MRI of the brain was performed without the administration of intravenous contrast. MRA of the head was performed without contrast using time-of-flight technique. 3D postprocessing with multiplanar reformations and MIPs was performed for better evaluation of the vasculature. COMPARISON: 02/26/2019 CLINICAL HISTORY: Stroke, follow up. FINDINGS: MRI BRAIN: BRAIN AND VENTRICLES: Large early subacute infarct of the left MCA territory predominantly affecting the posterior region. Small amount of petechial hemorrhage  in the infarcted region. Multifocal hyperintense T2-weighted signal within the cerebral white matter, most commonly due to chronic small vessel disease. Multiple old infarcts of the right MCA and PCA territories. Abnormal left ICA flow void. No mass or abnormal enhancement. No midline shift. No hydrocephalus. The sella is unremarkable. Normal flow voids. ORBITS: No acute abnormality. SINUSES AND MASTOIDS: No acute abnormality. BONES AND SOFT TISSUES: Normal bone marrow signal. No acute soft tissue abnormality. MRA HEAD: ANTERIOR CIRCULATION: There is no flow related enhancement within the left ICA on the time of flight angiographic images. No significant stenosis of the right internal carotid artery. There is normal flow related enhancement throughout the left MCA. No significant stenosis of the right middle cerebral artery. No significant stenosis of the anterior cerebral arteries. No aneurysm. POSTERIOR CIRCULATION: Unchanged severe stenosis of the right PCA P2 segment. No significant stenosis of the left posterior cerebral artery. No significant stenosis of the basilar artery. No significant stenosis of the vertebral arteries. No aneurysm. IMPRESSION: 1. Large early subacute left MCA territory infarct, predominantly posterior, with small petechial hemorrhage (Heidelberg class 1). 2. Left ICA occlusion.  Left MCA is now patent. 3. Multiple chronic infarcts in the right MCA and PCA territories. 4. Unchanged severe stenosis of the right PCA P2  segment. Electronically signed by: Franky Stanford MD 02/22/2024 12:52 AM EDT RP Workstation: [id number]   MR ANGIO HEAD WO CONTRAST Result Date: 02/22/2024 EXAM: MRI BRAIN WITHOUT CONTRAST MRA HEAD WITHOUT CONTRAST 02/22/2024 12:35:18 AM TECHNIQUE: Multiplanar multisequence MRI of the brain was performed without the administration of intravenous contrast. MRA of the head was performed without contrast using time-of-flight technique. 3D postprocessing with multiplanar  reformations and MIPs was performed for better evaluation of the vasculature. COMPARISON: 02/26/2019 CLINICAL HISTORY: Stroke, follow up. FINDINGS: MRI BRAIN: BRAIN AND VENTRICLES: Large early subacute infarct of the left MCA territory predominantly affecting the posterior region. Small amount of petechial hemorrhage in the infarcted region. Multifocal hyperintense T2-weighted signal within the cerebral white matter, most commonly due to chronic small vessel disease. Multiple old infarcts of the right MCA and PCA territories. Abnormal left ICA flow void. No mass or abnormal enhancement. No midline shift. No hydrocephalus. The sella is unremarkable. Normal flow voids. ORBITS: No acute abnormality. SINUSES AND MASTOIDS: No acute abnormality. BONES AND SOFT TISSUES: Normal bone marrow signal. No acute soft tissue abnormality. MRA HEAD: ANTERIOR CIRCULATION: There is no flow related enhancement within the left ICA on the time of flight angiographic images. No significant stenosis of the right internal carotid artery. There is normal flow related enhancement throughout the left MCA. No significant stenosis of the right middle cerebral artery. No significant stenosis of the anterior cerebral arteries. No aneurysm. POSTERIOR CIRCULATION: Unchanged severe stenosis of the right PCA P2 segment. No significant stenosis of the left posterior cerebral artery. No significant stenosis of the basilar artery. No significant stenosis of the vertebral arteries. No aneurysm. IMPRESSION: 1. Large early subacute left MCA territory infarct, predominantly posterior, with small petechial hemorrhage (Heidelberg class 1). 2. Left ICA occlusion.  Left MCA is now patent. 3. Multiple chronic infarcts in the right MCA and PCA territories. 4. 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 Electronically signed by: Franky Stanford MD 02/22/2024 12:52 AM EDT RP Workstation: [id number]   ECHOCARDIOGRAM COMPLETE Result Date: 02/21/2024     ECHOCARDIOGRAM REPORT   Patient Name:   Frank Fischer Date of Exam: 02/21/2024 Medical Rec #:  [id number]      Height:       70.0 in Accession #:    [id number]     Weight:       205.0 lb Date of Birth:  1960/08/06     BSA:          2.109 m Patient Age:    62 years       BP:           150/74 mmHg Patient Gender: M              HR:           50 bpm. Exam Location:  Inpatient Procedure: 2D Echo, 3D Echo, Color Doppler and Cardiac Doppler (Both Spectral            and Color Flow Doppler were utilized during procedure). Indications:    Stroke I63.9  History:        Patient has prior history of Echocardiogram examinations, most                 recent 02/26/2019. Stroke, Arrythmias:Bradycardia; Risk                 Factors:Hypertension, Current Smoker and PSA.  Sonographer:    Koleen Popper RDCS Referring Phys: [id number] NORMIE M BUI IMPRESSIONS  1.  Left ventricular ejection fraction, by estimation, is 60 to 65%. Left ventricular ejection fraction by 3D volume is 52 %. The left ventricle has normal function. The left ventricle has no regional wall motion abnormalities. Left ventricular diastolic  parameters were normal.  2. Right ventricular systolic function is normal. The right ventricular size is normal. Tricuspid regurgitation signal is inadequate for assessing PA pressure.  3. The mitral valve is normal in structure. No evidence of mitral valve regurgitation. No evidence of mitral stenosis.  4. The aortic valve was not well visualized. Aortic valve regurgitation is not visualized. No aortic stenosis is present.  5. The inferior vena cava is normal in size with greater than 50% respiratory variability, suggesting right atrial pressure of 3 mmHg.  6. Increased flow velocities may be secondary to anemia, thyrotoxicosis, hyperdynamic or high flow state. Comparison(s): No significant change from prior study. FINDINGS  Left Ventricle: Left ventricular ejection fraction, by estimation, is 60 to 65%. Left ventricular ejection  fraction by 3D volume is 52 %. The left ventricle has normal function. The left ventricle has no regional wall motion abnormalities. Strain was performed and the global longitudinal strain is indeterminate. The left ventricular internal cavity size was normal in size. There is no left ventricular hypertrophy. Left ventricular diastolic parameters were normal. Right Ventricle: The right ventricular size is normal. No increase in right ventricular wall thickness. Right ventricular systolic function is normal. Tricuspid regurgitation signal is inadequate for assessing PA pressure. Left Atrium: Left atrial size was normal in size. Right Atrium: Right atrial size was normal in size. Pericardium: There is no evidence of pericardial effusion. Mitral Valve: The mitral valve is normal in structure. No evidence of mitral valve regurgitation. No evidence of mitral valve stenosis. Tricuspid Valve: The tricuspid valve is normal in structure. Tricuspid valve regurgitation is not demonstrated. No evidence of tricuspid stenosis. Aortic Valve: The aortic valve was not well visualized. Aortic valve regurgitation is not visualized. No aortic stenosis is present. Aortic valve mean gradient measures 6.0 mmHg. Aortic valve peak gradient measures 13.5 mmHg. Aortic valve area, by VTI measures 2.53 cm. Pulmonic Valve: The pulmonic valve was normal in structure. Pulmonic valve regurgitation is not visualized. No evidence of pulmonic stenosis. Aorta: The aortic root is normal in size and structure. Venous: The inferior vena cava is normal in size with greater than 50% respiratory variability, suggesting right atrial pressure of 3 mmHg. IAS/Shunts: No atrial level shunt detected by color flow Doppler. Additional Comments: 3D was performed not requiring image post processing on an independent workstation and was indeterminate.  LEFT VENTRICLE PLAX 2D LVIDd:         4.50 cm         Diastology LVIDs:         3.24 cm         LV e' medial:     11.60 cm/s LV PW:         0.85 cm         LV E/e' medial:  8.8 LV IVS:        1.09 cm         LV e' lateral:   13.50 cm/s LVOT diam:     1.81 cm         LV E/e' lateral: 7.6 LV SV:         80 LV SV Index:   38 LVOT Area:     2.57 cm        3D Volume EF  LV 3D EF:    Left                                             ventricul LV Volumes (MOD)                            ar LV vol d, MOD    132.0 ml                   ejection A4C:                                        fraction LV vol s, MOD    51.6 ml                    by 3D A4C:                                        volume is LV SV MOD A4C:   132.0 ml                   52 %.                                 3D Volume EF:                                3D EF:        52 %                                LV EDV:       144 ml                                LV ESV:       69 ml                                LV SV:        75 ml RIGHT VENTRICLE             IVC RV Basal diam:  3.98 cm     IVC diam: 1.92 cm RV S prime:     18.20 cm/s TAPSE (M-mode): 2.4 cm LEFT ATRIUM             Index        RIGHT ATRIUM           Index LA diam:        2.86 cm 1.36 cm/m   RA Area:     12.60 cm LA Vol (A2C):   55.2 ml 26.17 ml/m  RA Volume:   26.90 ml  12.75 ml/m LA Vol (A4C):   26.8 ml 12.70 ml/m LA Biplane Vol: 37.8 ml 17.92 ml/m  AORTIC VALVE AV Area (Vmax):    2.28 cm AV  Area (Vmean):   2.22 cm AV Area (VTI):     2.53 cm AV Vmax:           184.00 cm/s AV Vmean:          102.000 cm/s AV VTI:            0.317 m AV Peak Grad:      13.5 mmHg AV Mean Grad:      6.0 mmHg LVOT Vmax:         163.00 cm/s LVOT Vmean:        88.100 cm/s LVOT VTI:          0.312 m LVOT/AV VTI ratio: 0.98  AORTA Ao Root diam: 2.37 cm MITRAL VALVE MV Area (PHT): 3.42 cm     SHUNTS MV Decel Time: 222 msec     Systemic VTI:  0.31 m MV E velocity: 102.00 cm/s  Systemic Diam: 1.81 cm MV A velocity: 66.60 cm/s MV E/A ratio:  1.53 Vishnu Priya Mallipeddi Electronically signed by Diannah Late Mallipeddi Signature Date/Time: 02/21/2024/4:42:33 PM    Final    IR PERCUTANEOUS ART THROMBECTOMY/INFUSION INTRACRANIAL INC DIAG ANGIO Result Date: 02/21/2024 PROCEDURE PERFORMED: 1. Cerebral angiography with stroke thrombectomy 2. Ultrasound guided vascular access 3. Cone beam CT for treatment planning COMPARISON:  CT angiogram of the head and neck performed February 21, 2024 CLINICAL DATA:  63 year old male with acute ischemic stroke and symptoms of right-sided plegia, aphasia, and gaze preference. NIH stroke scale measures 25. Patient was a candidate for thrombolysis and also mechanical thrombectomy and presented to interventional radiology for treatment. INDICATION: Acute ischemic stroke. ANESTHESIA/SEDATION: General anesthesia was utilized for the procedure. CONTRAST:  Approximately 50 cc Ominipque 300 MEDICATIONS: See MAR FLUOROSCOPY TIME:  Fluoroscopy Time: 12 minutes, (628 mGy). COMPLICATIONS: None immediate. BODY OF REPORT: Following a full explanation of the procedure along with the potential associated complications, an informed witnessed consent was obtained. The patient was then placed under general anesthesia by the Department of Anesthesiology at [facility]. The right femoral access site was prepped and draped in the usual sterile fashion. Ultrasound was used to study the right common femoral artery which was patent. Using real-time ultrasound guidance, a 19 gauge introducer needle was used to access the right common femoral artery. Access was performed at 01:49. A hard copy image ultrasound the saved and stored in PACS. Using this access, a 6 French sheath was placed in the descending thoracic aorta. Next, selective catheterization the left internal carotid artery was performed. 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 Pretreatment TICI score 0. Next, red 72 was advanced into the distal left ICA. The  first pass was performed at 0203. This resulted in partial extraction of thrombus from the carotid terminus. Next, selective catheterization of the left M1 segment was performed and an EMBO trap was deployed within the left M1 segment and distal ICA. This resulted in partial extraction of the left M1 thrombus and the residual thrombus within the distal left ICA. This pass was performed at 02:15. Finally, a third pass was performed using a penumbra 43 catheter into a proximal left M2 branch occlusion which successfully yielded thrombus. This pass was performed at 02:25. There was no significant residual thromboembolic disease. Mild plaque was present within the distal left intracranial ICA. A mild stenosis is also present in the proximal left ICA origin which was estimated at less than 50% based on NASCET  criteria. Post treatment TICI score 3. After reviewing the imaging, I elected to terminate the procedure at this point. Evaluation of the right femoral access site demonstrated that the site was suitable for a closure device. A 6 French Angio-Seal device was deployed without complication. Cone beam CT was then performed to evaluate for intracranial hemorrhage and treatment planning. This demonstrated minimal contrast staining. The patient was removed from anesthesia and transferred to recovery in stable condition. IMPRESSION: 1. Suction and stent retriever assisted thrombectomy of left carotid terminus and M1 occlusion. 2. Post-treatment TICI score = 3. PLAN: 1. To ICU for routine postoperative supportive care. Electronically Signed   By: Maude Naegeli M.D.   On: 02/21/2024 09:15   IR US  Guide Vasc Access Right Result Date: 02/21/2024 PROCEDURE PERFORMED: 1. Cerebral angiography with stroke thrombectomy 2. Ultrasound guided vascular access 3. Cone beam CT for treatment planning COMPARISON:  CT angiogram of the head and neck performed February 21, 2024 CLINICAL DATA:  63 year old male with acute ischemic stroke and  symptoms of right-sided plegia, aphasia, and gaze preference. NIH stroke scale measures 25. Patient was a candidate for thrombolysis and also mechanical thrombectomy and presented to interventional radiology for treatment. INDICATION: Acute ischemic stroke. ANESTHESIA/SEDATION: General anesthesia was utilized for the procedure. CONTRAST:  Approximately 50 cc Ominipque 300 MEDICATIONS: See MAR FLUOROSCOPY TIME:  Fluoroscopy Time: 12 minutes, (628 mGy). COMPLICATIONS: None immediate. BODY OF REPORT: Following a full explanation of the procedure along with the potential associated complications, an informed witnessed consent was obtained. The patient was then placed under general anesthesia by the Department of Anesthesiology at [facility]. The right femoral access site was prepped and draped in the usual sterile fashion. Ultrasound was used to study the right common femoral artery which was patent. Using real-time ultrasound guidance, a 19 gauge introducer needle was used to access the right common femoral artery. Access was performed at 01:49. A hard copy image ultrasound the saved and stored in PACS. Using this access, a 6 French sheath was placed in the descending thoracic aorta. Next, selective catheterization the left internal carotid artery was performed. 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 Pretreatment TICI score 0. Next, red 72 was advanced into the distal left ICA. The first pass was performed at 0203. This resulted in partial extraction of thrombus from the carotid terminus. Next, selective catheterization of the left M1 segment was performed and an EMBO trap was deployed within the left M1 segment and distal ICA. This resulted in partial extraction of the left M1 thrombus and the residual thrombus within the distal left ICA. This pass was performed at 02:15. Finally, a third pass was performed using a penumbra  43 catheter into a proximal left M2 branch occlusion which successfully yielded thrombus. This pass was performed at 02:25. There was no significant residual thromboembolic disease. Mild plaque was present within the distal left intracranial ICA. A mild stenosis is also present in the proximal left ICA origin which was estimated at less than 50% based on NASCET criteria. Post treatment TICI score 3. After reviewing the imaging, I elected to terminate the procedure at this point. Evaluation of the right femoral access site demonstrated that the site was suitable for a closure device. A 6 French Angio-Seal device was deployed without complication. Cone beam CT was then performed to evaluate for intracranial hemorrhage and treatment planning. This demonstrated minimal contrast staining. The patient was removed from  anesthesia and transferred to recovery in stable condition. IMPRESSION: 1. Suction and stent retriever assisted thrombectomy of left carotid terminus and M1 occlusion. 2. Post-treatment TICI score = 3. PLAN: 1. To ICU for routine postoperative supportive care. Electronically Signed   By: Maude Naegeli M.D.   On: 02/21/2024 09:15   IR CT Head Ltd Result Date: 02/21/2024 PROCEDURE PERFORMED: 1. Cerebral angiography with stroke thrombectomy 2. Ultrasound guided vascular access 3. Cone beam CT for treatment planning COMPARISON:  CT angiogram of the head and neck performed February 21, 2024 CLINICAL DATA:  63 year old male with acute ischemic stroke and symptoms of right-sided plegia, aphasia, and gaze preference. NIH stroke scale measures 25. Patient was a candidate for thrombolysis and also mechanical thrombectomy and presented to interventional radiology for treatment. INDICATION: Acute ischemic stroke. ANESTHESIA/SEDATION: General anesthesia was utilized for the procedure. CONTRAST:  Approximately 50 cc Ominipque 300 MEDICATIONS: See MAR FLUOROSCOPY TIME:  Fluoroscopy Time: 12 minutes, (628 mGy).  COMPLICATIONS: None immediate. BODY OF REPORT: Following a full explanation of the procedure along with the potential associated complications, an informed witnessed consent was obtained. The patient was then placed under general anesthesia by the Department of Anesthesiology at [facility]. The right femoral access site was prepped and draped in the usual sterile fashion. Ultrasound was used to study the right common femoral artery which was patent. Using real-time ultrasound guidance, a 19 gauge introducer needle was used to access the right common femoral artery. Access was performed at 01:49. A hard copy image ultrasound the saved and stored in PACS. Using this access, a 6 French sheath was placed in the descending thoracic aorta. Next, selective catheterization the left internal carotid artery was performed. 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 Pretreatment TICI score 0. Next, red 72 was advanced into the distal left ICA. The first pass was performed at 0203. This resulted in partial extraction of thrombus from the carotid terminus. Next, selective catheterization of the left M1 segment was performed and an EMBO trap was deployed within the left M1 segment and distal ICA. This resulted in partial extraction of the left M1 thrombus and the residual thrombus within the distal left ICA. This pass was performed at 02:15. Finally, a third pass was performed using a penumbra 43 catheter into a proximal left M2 branch occlusion which successfully yielded thrombus. This pass was performed at 02:25. There was no significant residual thromboembolic disease. Mild plaque was present within the distal left intracranial ICA. A mild stenosis is also present in the proximal left ICA origin which was estimated at less than 50% based on NASCET criteria. Post treatment TICI score 3. After reviewing the imaging, I elected to terminate  the procedure at this point. Evaluation of the right femoral access site demonstrated that the site was suitable for a closure device. A 6 French Angio-Seal device was deployed without complication. Cone beam CT was then performed to evaluate for intracranial hemorrhage and treatment planning. This demonstrated minimal contrast staining. The patient was removed from anesthesia and transferred to recovery in stable condition. IMPRESSION: 1. Suction and stent retriever assisted thrombectomy of left carotid terminus and M1 occlusion. 2. Post-treatment TICI score = 3. PLAN: 1. To ICU for routine postoperative supportive care. Electronically Signed   By: Maude Naegeli M.D.   On: 02/21/2024 09:15   CT C-SPINE NO CHARGE Result Date: 02/21/2024 EXAM: CT CERVICAL SPINE WITHOUT CONTRAST 02/21/2024 01:19:35 AM TECHNIQUE:  CT of the cervical spine was performed without the administration of intravenous contrast. Multiplanar reformatted images are provided for review. Automated exposure control, iterative reconstruction, and/or weight based adjustment of the mA/kV was utilized to reduce the radiation dose to as low as reasonably achievable. COMPARISON: None available. CLINICAL HISTORY: Neck trauma (Age >= 65y); Neck trauma, intoxicated or obtunded (Age >= 16y). FINDINGS: BONES AND ALIGNMENT: No acute fracture or traumatic malalignment. DEGENERATIVE CHANGES: No significant degenerative changes. SOFT TISSUES: No prevertebral soft tissue swelling. IMPRESSION: 1. No acute abnormality of the cervical spine. Electronically signed by: Franky Stanford MD 02/21/2024 01:33 AM EDT RP Workstation: [id number]   CT ANGIO HEAD NECK W WO CM (CODE STROKE) Result Date: 02/21/2024 EXAM: CTA HEAD AND NECK WITH AND WITHOUT 02/21/2024 01:18:14 AM TECHNIQUE: CTA of the head and neck was performed with and without the administration of 75 mL of intravenous iohexol  (OMNIPAQUE ) 350 MG/ML injection. Multiplanar 2D and/or 3D reformatted images are  provided for review. Automated exposure control, iterative reconstruction, and/or weight based adjustment of the mA/kV was utilized to reduce the radiation dose to as low as reasonably achievable. Stenosis of the internal carotid arteries measured using NASCET criteria. COMPARISON: None available CLINICAL HISTORY: Neuro deficit, acute, stroke suspected. Stroke, Rt side weakness, hx strokes; Dr. Sallyann. FINDINGS: CTA NECK: AORTIC ARCH AND ARCH VESSELS: No dissection or arterial injury. No significant stenosis of the brachiocephalic or subclavian arteries. CERVICAL CAROTID ARTERIES: Progressive narrowing of the distal left internal carotid artery beginning at the petrous segment with occlusion at the distal cavernous segment. There is reconstitution of the supraclinoid portion with attenuated enhancement. The right internal carotid artery is patent without significant stenosis or dissection. No dissection or arterial injury is identified in the remaining cervical carotid arteries. CERVICAL VERTEBRAL ARTERIES: No dissection, arterial injury, or significant stenosis. LUNGS AND MEDIASTINUM: Biapical Emphysema. SOFT TISSUES: No acute abnormality. BONES: No acute abnormality. CTA HEAD: ANTERIOR CIRCULATION: The left internal carotid artery demonstrates progressive narrowing beginning at the petrous segment with occlusion at the distal cavernous segment. There is reconstitution of the supraclinoid portion with attenuated enhancement. The left MCA is occluded at its origin with poor distal collateralization. Moderate stenosis of the right MCA M1 segment. No significant stenosis of the anterior cerebral arteries. No aneurysm. POSTERIOR CIRCULATION: Mild stenosis of the right PCA p2 segment. Mild atherosclerotic irregularity of the left PCA without high-grade stenosis. No significant stenosis of the basilar artery. No significant stenosis of the vertebral arteries. No aneurysm. OTHER: No dural venous sinus thrombosis on this  non-dedicated study. IMPRESSION: 1. Emergent large vessel occlusion of the left MCA at its origin with poor distal collateralization. 2. Progressive narrowing of the distal left internal carotid artery beginning at the petrous segment with occlusion at the distal cavernous segment and reconstitution of the supraclinoid portion with attenuated enhancement. 3. Moderate stenosis of the right MCA M1 segment. 4. Mild stenosis of the right PCA P2 segment. 5. Mild atherosclerotic irregularity of the left PCA without high-grade stenosis. Critical findings communicated to Dr. MAGDALENO Sallyann via telephone at 1:28 AM on 02/21/24. Electronically signed by: Franky Stanford MD 02/21/2024 01:32 AM EDT RP Workstation: [id number]   CT HEAD CODE STROKE WO CONTRAST Result Date: 02/21/2024 EXAM: CT HEAD WITHOUT CONTRAST 02/21/2024 12:40:34 AM TECHNIQUE: CT of the head was performed without the administration of intravenous contrast. Automated exposure control, iterative reconstruction, and/or weight based adjustment of the mA/kV was utilized to reduce the radiation dose to as low as reasonably achievable. COMPARISON: 02/26/2019  CLINICAL HISTORY: Neuro deficit, acute, stroke suspected. Stroke, Rt side weakness. FINDINGS: BRAIN AND VENTRICLES: No acute hemorrhage. There are old infarcts of the right MCA and PCA territory. There is a focal area of loss of gray-white differentiation in the posterior left MCA territory. ASPECTS is 8. Hyperdense left MCA. No hydrocephalus, extra-axial collection, mass effect, or midline shift. ASPECTS ([place] stroke program early CT score) - ganglionic level infarction (caudate, lentiform nuclei, internal capsule, insula, M1-m3 cortex): 6 - supraganglionic infarction (m4-m6 cortex): 2 Total score (0-10 with 10 being normal): 8 ORBITS: No acute abnormality. SINUSES: No acute abnormality. SOFT TISSUES AND SKULL: No acute soft tissue abnormality. No skull fracture. IMPRESSION: 1. Acute/early subacute left MCA territory  infarct with focal loss of gray-white differentiation (ASPECTS 8) and hyperdense left MCA. 2. No intracranial hemorrhage. 3. 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 Findings communicated to Dr. MAGDALENO Blower via [facility] text page at 12:50 AM on 02/21/24. Electronically signed by: Franky Stanford MD 02/21/2024 12:52 AM EDT RP Workstation: [id number]    Microbiology: Recent Results (from the past 240 hours)  Resp panel by RT-PCR (RSV, Flu A&B, Covid) Anterior Nasal Swab     Status: None   Collection Time: 02/21/24  1:11 AM   Specimen: Anterior Nasal Swab  Result Value Ref Range Status   SARS Coronavirus 2 by RT PCR NEGATIVE NEGATIVE Final   Influenza A by PCR NEGATIVE NEGATIVE Final   Influenza B by PCR NEGATIVE NEGATIVE Final    Comment: (NOTE) The Xpert Xpress SARS-CoV-2/FLU/RSV plus assay is intended as an aid in the diagnosis of influenza from Nasopharyngeal swab specimens and should not be used as a sole basis for treatment. Nasal washings and aspirates are unacceptable for Xpert Xpress SARS-CoV-2/FLU/RSV testing.  Fact Sheet for Patients: BloggerCourse.com  Fact Sheet for Healthcare Providers: SeriousBroker.it  This test is not yet approved or cleared by the [place]  FDA and has been authorized for detection and/or diagnosis of SARS-CoV-2 by FDA under an Emergency Use Authorization (EUA). This EUA will remain in effect (meaning this test can be used) for the duration of the COVID-19 declaration under Section 564(b)(1) of the Act, 21 U.S.C. section 360bbb-3(b)(1), unless the authorization is terminated or revoked.     Resp Syncytial Virus by PCR NEGATIVE NEGATIVE Final    Comment: (NOTE) Fact Sheet for Patients: BloggerCourse.com  Fact Sheet for Healthcare Providers: SeriousBroker.it  This test is not yet approved or cleared by the [place]  FDA and has been  authorized for detection and/or diagnosis of SARS-CoV-2 by FDA under an Emergency Use Authorization (EUA). This EUA will remain in effect (meaning this test can be used) for the duration of the COVID-19 declaration under Section 564(b)(1) of the Act, 21 U.S.C. section 360bbb-3(b)(1), unless the authorization is terminated or revoked.  Performed at [facility], [address] [place], [place] [address]   MRSA Next Gen by PCR, Nasal     Status: None   Collection Time: 02/21/24  6:14 AM   Specimen: Nasal Swab  Result Value Ref Range Status   MRSA by PCR Next Gen NOT DETECTED NOT DETECTED Final    Comment: (NOTE) The GeneXpert MRSA Assay (FDA approved for NASAL specimens only), is one component of a comprehensive MRSA colonization surveillance program. It is not intended to diagnose MRSA infection nor to guide or monitor treatment for MRSA infections. Test performance is not FDA approved in patients less than 34 years old. Performed at [facility]  Lab, [address], [place], [place] [address]      Labs: Basic Metabolic Panel: Recent Labs  Lab 02/26/24 0425 02/28/24 0648 02/29/24 0556 03/01/24 0220  NA 139 140 144 138  K 3.5 4.0 3.9 3.9  CL 104 105 107 105  CO2 22 21* 21* 18*  GLUCOSE 94 101* 94 87  BUN 9 12 14 15   CREATININE 1.15 1.03 1.13 0.99  CALCIUM  8.8* 9.1 8.9 8.8*  MG 2.1  --   --  2.5*  PHOS 4.8*  --   --   --    Liver Function Tests: Recent Labs  Lab 02/28/24 0648 02/29/24 0556  AST 32 33  ALT 28 29  ALKPHOS 105 110  BILITOT 0.8 0.8  PROT 7.2 7.2  ALBUMIN 3.7 3.6   No results for input(s): LIPASE, AMYLASE in the last 168 hours. No results for input(s): AMMONIA in the last 168 hours. CBC: Recent Labs  Lab 02/26/24 0425 02/28/24 0648 02/29/24 0556 03/01/24 0220  WBC 6.2 5.9 7.1 7.1  NEUTROABS  --  4.0  --   --   HGB 14.2 16.1 15.8 16.1  HCT 42.6 48.2 46.7 49.3  MCV 95.3 95.6 95.3 99.2  PLT 281 282 280 266   Cardiac  Enzymes: No results for input(s): CKTOTAL, CKMB, CKMBINDEX, TROPONINI in the last 168 hours. BNP: BNP (last 3 results) No results for input(s): BNP in the last 8760 hours.  ProBNP (last 3 results) No results for input(s): PROBNP in the last 8760 hours.  CBG: Recent Labs  Lab 02/28/24 1205  GLUCAP 101*       Signed:  Sigurd Pac MD.  [facility] 03/01/2024, 9:42 AM

## 2024-03-01 NOTE — Evaluation (Signed)
 Physical Therapy Evaluation Patient Details Name: Frank Fischer MRN: [id number] DOB: 1960/08/12 Today's Date: 03/01/2024  History of Present Illness  Pt is a 63 y.o. male who presented 02/21/24 with L gaze and R-sided weakness. Pt received TNK. S/p L carotid terminus and M1 thrombectomy 10/4. MRI showed large early subacute left MCA territory infarct, predominantly posterior, with small petechial hemorrhage. Readmit from [facility] 02/28/2024 after syncopal event. PMH: multiple prior strokes (R sided, with residual mild L hemiplegia), prostate cancer (s/p brachytherapy, 2018), polysubstance use (marijuana, crack, tobacco, alcohol), HTN, depression, BPH  Clinical Impression  Pt with readmit from AIR with syncopal event. Pt indicating he wants to go home, very poor insight and awareness into deficits and situation. Pt agreeable for check of orthostatics, which were negative (see vitals flowsheet). Pt able to transition to edge of bed without physical assist and stand multiple times from edge of bed with min assist. Demonstrates impaired standing balance, R sided weakness, decreased cognition, and communication impairment. Episode of urinary incontinence while standing. Patient will benefit from intensive inpatient follow-up therapy, >3 hours/day to address deficits, maximize functional mobility and decrease caregiver burden.         If plan is discharge home, recommend the following: A lot of help with walking and/or transfers;A lot of help with bathing/dressing/bathroom   Can travel by private vehicle        Equipment Recommendations Rolling walker (2 wheels);BSC/3in1;Wheelchair (measurements PT);Wheelchair cushion (measurements PT)  Recommendations for Other Services  Rehab consult    Functional Status Assessment Patient has had a recent decline in their functional status and demonstrates the ability to make significant improvements in function in a reasonable and predictable amount of time.      Precautions / Restrictions Precautions Precautions: Fall Recall of Precautions/Restrictions: Impaired Precaution/Restrictions Comments: SBP < 160 Restrictions Weight Bearing Restrictions Per Provider Order: No      Mobility  Bed Mobility Overal bed mobility: Needs Assistance Bed Mobility: Supine to Sit     Supine to sit: Contact guard     General bed mobility comments: Able to transition to left side of bed without physical assist    Transfers Overall transfer level: Needs assistance Equipment used: None Transfers: Sit to/from Stand Sit to Stand: Min assist           General transfer comment: MinA to power up to rise and steady, right lateral lean, posterior LOB with decreased eccentric control back to sitting EOB    Ambulation/Gait               General Gait Details: deferred by pt  Stairs            Wheelchair Mobility     Tilt Bed    Modified Rankin (Stroke Patients Only) Modified Rankin (Stroke Patients Only) Pre-Morbid Rankin Score: No symptoms Modified Rankin: Moderately severe disability     Balance Overall balance assessment: Needs assistance Sitting-balance support: No upper extremity supported, Feet supported Sitting balance-Leahy Scale: Fair     Standing balance support: No upper extremity supported, During functional activity Standing balance-Leahy Scale: Poor                               Pertinent Vitals/Pain Pain Assessment Pain Assessment: Faces Faces Pain Scale: No hurt    Home Living Family/patient expects to be discharged to:: Private residence Living Arrangements: Spouse/significant other Available Help at Discharge: Family Type of Home: House Home Access: Level  entry       Home Layout: One level        Prior Function Prior Level of Function : Independent/Modified Independent             Mobility Comments: no AD ADLs Comments: does not drive     Extremity/Trunk Assessment   Upper  Extremity Assessment Upper Extremity Assessment: Defer to OT evaluation    Lower Extremity Assessment Lower Extremity Assessment: Difficult to assess due to impaired cognition;RLE deficits/detail RLE Deficits / Details: Grossly 3-/5, no knee buckle in standing    Cervical / Trunk Assessment Cervical / Trunk Assessment: Normal  Communication   Communication Communication: Impaired Factors Affecting Communication: Difficulty expressing self;Reduced clarity of speech    Cognition Arousal: Alert Behavior During Therapy: Flat affect   PT - Cognitive impairments: Difficult to assess, Attention, Awareness, Initiation, Sequencing, Problem solving, Safety/Judgement Difficult to assess due to: Impaired communication                     PT - Cognition Comments: expressive and receptive difficulties, pt indicating he wants to go home. able to follow verbal commands when participatory Following commands: Impaired Following commands impaired: Follows one step commands inconsistently, Follows one step commands with increased time     Cueing Cueing Techniques: Verbal cues     General Comments      Exercises     Assessment/Plan    PT Assessment Patient needs continued PT services  PT Problem List Decreased strength;Decreased activity tolerance;Decreased balance;Decreased mobility;Decreased coordination;Decreased cognition;Decreased knowledge of use of DME;Decreased safety awareness       PT Treatment Interventions DME instruction;Gait training;Stair training;Functional mobility training;Therapeutic activities;Therapeutic exercise;Balance training;Neuromuscular re-education;Cognitive remediation;Patient/family education    PT Goals (Current goals can be found in the Care Plan section)  Acute Rehab PT Goals Patient Stated Goal: go home PT Goal Formulation: With patient Time For Goal Achievement: 03/15/24 Potential to Achieve Goals: Good    Frequency Min 3X/week      Co-evaluation               AM-PAC PT 6 Clicks Mobility  Outcome Measure Help needed turning from your back to your side while in a flat bed without using bedrails?: A Little Help needed moving from lying on your back to sitting on the side of a flat bed without using bedrails?: A Little Help needed moving to and from a bed to a chair (including a wheelchair)?: A Little Help needed standing up from a chair using your arms (e.g., wheelchair or bedside chair)?: A Little Help needed to walk in hospital room?: A Lot Help needed climbing 3-5 steps with a railing? : Total 6 Click Score: 15    End of Session Equipment Utilized During Treatment: Gait belt Activity Tolerance: Patient tolerated treatment well Patient left: in bed;with call bell/phone within reach;with bed alarm set;with nursing/sitter in room (sitting EOB with RN) Nurse Communication: Mobility status PT Visit Diagnosis: Unsteadiness on feet (R26.81);Other symptoms and signs involving the nervous system (R29.898);Hemiplegia and hemiparesis Hemiplegia - Right/Left: Right Hemiplegia - dominant/non-dominant: Dominant Hemiplegia - caused by: Cerebral infarction    Time: 9160-9094 PT Time Calculation (min) (ACUTE ONLY): 26 min   Charges:   PT Evaluation $PT Eval Moderate Complexity: 1 Mod PT Treatments $Therapeutic Activity: 8-22 mins PT General Charges $$ ACUTE PT VISIT: 1 Visit         Aleck Daring, PT, DPT Acute Rehabilitation Services Office [phone]   Aleck ONEIDA Daring 03/01/2024, 12:07  PM

## 2024-03-01 NOTE — Progress Notes (Signed)
 Inpatient Rehabilitation  Patient information reviewed and entered into eRehab system by [facility]. Karen Kays., CCC/SLP, PPS Coordinator.  Information including medical coding, functional ability and quality indicators will be reviewed and updated through discharge.

## 2024-03-01 NOTE — Progress Notes (Signed)
 PROGRESS NOTE   Subjective/Complaints:  Pt in bed, alert. Indicated that he wanted to go home.   ROS: Limited due to cognitive/behavioral   Objective:   EEG adult Result Date: 02/28/2024 Michaela Aisha SQUIBB, MD     02/28/2024  6:36 PM History: 63 yo M being evaluated for loss of conciousness with previous strokes, evaluating for seizure predisposition EEG Duration: 30 minutes Sedation: none Patient State: Awake and drowsy Technique: This EEG was acquired with electrodes placed according to the International 10-20 electrode system (including Fp1, Fp2, F3, F4, C3, C4, P3, P4, O1, O2, T3, T4, T5, T6, A1, A2, Fz, Cz, Pz). The following electrodes were missing or displaced: none. Background: The background consists of intermixed alpha and frontally predominant beta activities.  The posterior dominant rhythm is low voltage, but is seen with a frequency of 9 Hz.  There is anterior shifting of the PDR with drowsiness, but sleep is not recorded. Photic stimulation: Physiologic driving is not performed EEG Abnormalities: 1) low voltage PDR Clinical Interpretation: This EEG revealed evidence of bilateral posterior quadrant dysfunction, consistent with the patient's known strokes. There was no seizure or seizure predisposition recorded on this study. Please note that lack of epileptiform activity on EEG does not preclude the possibility of epilepsy. Aisha Michaela, MD [facility] If 7pm- 7am, please page neurology on call as listed in AMION.  CT HEAD WO CONTRAST ( ) Result Date: 02/28/2024 EXAM: CT HEAD WITHOUT CONTRAST 02/28/2024 02:53:24 PM TECHNIQUE: CT of the head was performed without the administration of intravenous contrast. Automated exposure control, iterative reconstruction, and/or weight based adjustment of the mA/kV was utilized to reduce the radiation dose to as low as reasonably achievable. COMPARISON: MRI head without  contrast 02/22/2024. CLINICAL HISTORY: Stroke, follow up. FINDINGS: BRAIN AND VENTRICLES: Expected evolution of the large left MCA territory infarct is noted. No acute hemorrhage is present. Remote infarcts involving the posterior right frontal lobe, right parietal lobe, and right occipital lobe are again noted. Remote lacunar infarcts of the right basal ganglia are stable. Atherosclerotic calcifications are present in the cavernous carotid arteries bilaterally. No hyperdense vessel is present. No evidence of acute infarct. No hydrocephalus. No extra-axial collection. No mass effect or midline shift. ORBITS: No acute abnormality. SINUSES: No acute abnormality. SOFT TISSUES AND SKULL: No acute soft tissue abnormality. No skull fracture. IMPRESSION: 1. No acute intracranial abnormality. 2. Expected evolution of the large left MCA territory infarct. 3. Remote infarcts involving the posterior right frontal lobe, right parietal lobe, and right occipital lobe. 4. Stable remote lacunar infarcts of the right basal ganglia. 5. Atherosclerotic calcifications in the cavernous carotid arteries bilaterally. No hyperdense vessel. Electronically signed by: Lonni Necessary MD 02/28/2024 04:00 PM EDT RP Workstation: [id number]   Recent Labs    02/29/24 0556 03/01/24 0220  WBC 7.1 7.1  HGB 15.8 16.1  HCT 46.7 49.3  PLT 280 266   Recent Labs    02/29/24 0556 03/01/24 0220  NA 144 138  K 3.9 3.9  CL 107 105  CO2 21* 18*  GLUCOSE 94 87  BUN 14 15  CREATININE 1.13 0.99  CALCIUM  8.9 8.8*    Intake/Output Summary (Last 24  hours) at 03/01/2024 1140 Last data filed at 02/29/2024 1610 Gross per 24 hour  Intake --  Output 250 ml  Net -250 ml        Physical Exam: Vital Signs Blood pressure (!) 156/74, pulse (!) 49, temperature 97.7 F (36.5 C), temperature source Oral, resp. rate 18, height 5' 7 (1.702 m), weight 84 kg, SpO2 94%.   Constitutional: No distress . Vital signs reviewed. HEENT: NCAT,  EOMI, oral membranes moist, exophthalmus  Neck: supple Cardiovascular: RRR without murmur. No JVD    Respiratory/Chest: CTA Bilaterally without wheezes or rales. Normal effort    GI/Abdomen: BS +, non-tender, non-distended Ext: no clubbing, cyanosis, or edema Psych: restless and distracted  Skin: No evidence of breakdown, no evidence of rash Neurologic: pt alert, mumbles, oriented to person, not place. Can follow simple commands. Limited insight and awareness.  CN exam non-focal. Moves all 4 limbs. Senses pain in all 4's. No abnl resting tone. No obvious limb ataxia.  Musc: Full ROM, No pain with AROM or PROM in the neck, trunk, or extremities. Posture appropriate     Assessment/Plan: 1. Functional deficits due to CVA now with AMS  Care Tool:  Bathing              Bathing assist       Upper Body Dressing/Undressing Upper body dressing        Upper body assist      Lower Body Dressing/Undressing Lower body dressing            Lower body assist       Toileting Toileting    Toileting assist       Transfers Chair/bed transfer  Transfers assist           Locomotion Ambulation   Ambulation assist              Walk 10 feet activity   Assist           Walk 50 feet activity   Assist           Walk 150 feet activity   Assist           Walk 10 feet on uneven surface  activity   Assist           Wheelchair     Assist               Wheelchair 50 feet with 2 turns activity    Assist            Wheelchair 150 feet activity     Assist          Blood pressure (!) 156/74, pulse (!) 49, temperature 97.7 F (36.5 C), temperature source Oral, resp. rate 18, height 5' 7 (1.702 m), weight 84 kg, SpO2 94%.  Medical Problem List and Plan: 1. 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              -patient may shower  -10/13--readmit to inpatient rehab (interrupted stay after syncope)             -ELOS/Goals: still ~ 15-20 days, min assist to supervision goals with PT, OT, SLP 2.  Antithrombotics: -DVT/anticoagulation:  Mechanical: Sequential compression devices, below knee Bilateral lower extremities Pharmaceutical: Lovenox              -antiplatelet therapy: Aspirin  3. Pain Management: Tylenol  prn  4. Mood/Behavior/Sleep: LCSW to follow  for evaluation and support when available.              -antipsychotic agents: Wellbutrin              - sleep chart 5. Neuropsych/cognition: This patient is not capable of making decisions on his own behalf.             -hope to see further improvement in arousal/cognition as Pb tapers off.  6. Skin/Wound Care: Routine pressure relief measures 7. Fluids/Electrolytes/Nutrition: Monitor intake and output.  Follow-up chemistries in AM.             - Ensure+ high protein +multivitamin+folic acid               -encourage adequate PO 8.  Hypertension: on home Amlodipine  10 mg and lisinopril  40 mg 9. BPH: Flomax  0.4 mg 10. HLD: LDL 111 goal < 70--Zetia  10 mg and atorvastatin  80 mg 11.  Poststroke dysphagia: SLP consult continue dysphagia 3 12.  Tobacco abuse: nicotine patch 7 mg  13.  Polysubstance abuse: Tox screen positive for cocaine and marijuana. 14.  Acute alcohol withdrawal: History of dependence drinking at least 6 beers daily. On 10/7 pt experienced restlessness/agitation and bradycardia concerning for ETOH withdrawal.              -continue Thiamine   -10/13 placed on phenobarbital taper which has been completed              15.  Hypokalemia:K+replaced with 40 mEq on 10/8. Now 3.5 and stable. Continue to monitor  16. Syncopal, unresponsive episode 10/11. (BP 43/34 at the time) -neuro work up negative for acute infarct -monitor orthostatic vs -TEDS and abdominal binder to help maintain bp -encourage PO, monitor I/O's. Off IVF currently -labs  in AM    LOS: 2 days A FACE TO FACE EVALUATION WAS PERFORMED  Arthea ONEIDA Gunther 03/01/2024, 11:40 AM

## 2024-03-01 NOTE — Progress Notes (Signed)
 Inpatient Rehab Admissions Coordinator:   Note pt transferred back to acute setting on 10/11 for unresponsive episode during therapy evaluations.  SBP noted to be 43/34, 73/50 in supine.  Pt transferred back to acute setting for workup which was largely negative for acute event.  Neurology consulted and felt event likely syncopal in the setting of hypotension.  He has been cleared to return to CIR.  Pt/family notified and in agreement.  TOC aware and Dr. Fairy in agreement.  We will transfer back to CIR today as an interrupted stay.    Reche Lowers, PT, DPT Admissions Coordinator [phone] 03/01/24  9:57 AM

## 2024-03-01 NOTE — TOC Transition Note (Signed)
 Transition of Care [facility]) - Discharge Note   Patient Details  Name: DOYNE MICKE MRN: [id number] Date of Birth: [birth year]  Transition of [facility] CM/SW Contact:  Andrez JULIANNA George, RN Phone Number: 03/01/2024, 12:06 PM   Clinical Narrative:     Pt is returning to CIR today. No further needs per IP Care management.   Final next level of care: IP Rehab Facility Barriers to Discharge: No Barriers Identified   Patient Goals and CMS Choice   CMS Medicare.gov Compare Post Acute Care list provided to:: Patient Choice offered to / list presented to : Patient, Spouse      Discharge Placement                       Discharge Plan and Services Additional resources added to the After Visit Summary for                                       Social Drivers of Health (SDOH) Interventions SDOH Screenings   Food Insecurity: No Food Insecurity (02/28/2024)  Housing: Low Risk  (02/28/2024)  Transportation Needs: No Transportation Needs (02/28/2024)  Utilities: Not At Risk (02/28/2024)  Depression (PHQ2-9): Low Risk  (03/12/2023)  Financial Resource Strain: Medium Risk (09/13/2022)  Social Connections: Moderately Isolated (02/28/2024)  Tobacco Use: High Risk (02/28/2024)     Readmission Risk Interventions    02/27/2024   12:19 PM  Readmission Risk Prevention Plan  Transportation Screening Complete  Home Care Screening Complete  Medication Review (RN CM) Complete

## 2024-03-01 NOTE — Progress Notes (Signed)
 PROGRESS NOTE   Subjective/Complaints:  Pt in bed, alert. Indicated that he wanted to go home.   ROS: Limited due to cognitive/behavioral   Objective:   EEG adult Result Date: 02/28/2024 Frank Aisha SQUIBB, MD     02/28/2024  6:36 PM History: 63 yo M being evaluated for loss of conciousness with previous strokes, evaluating for seizure predisposition EEG Duration: 30 minutes Sedation: none Patient State: Awake and drowsy Technique: This EEG was acquired with electrodes placed according to the International 10-20 electrode system (including Fp1, Fp2, F3, F4, C3, C4, P3, P4, O1, O2, T3, T4, T5, T6, A1, A2, Fz, Cz, Pz). The following electrodes were missing or displaced: none. Background: The background consists of intermixed alpha and frontally predominant beta activities.  The posterior dominant rhythm is low voltage, but is seen with a frequency of 9 Hz.  There is anterior shifting of the PDR with drowsiness, but sleep is not recorded. Photic stimulation: Physiologic driving is not performed EEG Abnormalities: 1) low voltage PDR Clinical Interpretation: This EEG revealed evidence of bilateral posterior quadrant dysfunction, consistent with the patient's known strokes. There was no seizure or seizure predisposition recorded on this study. Please note that lack of epileptiform activity on EEG does not preclude the possibility of epilepsy. Aisha Michaela, MD [facility] If 7pm- 7am, please page neurology on call as listed in AMION.  Recent Labs    02/29/24 0556 03/01/24 0220  WBC 7.1 7.1  HGB 15.8 16.1  HCT 46.7 49.3  PLT 280 266   Recent Labs    02/29/24 0556 03/01/24 0220  NA 144 138  K 3.9 3.9  CL 107 105  CO2 21* 18*  GLUCOSE 94 87  BUN 14 15  CREATININE 1.13 0.99  CALCIUM  8.9 8.8*    Intake/Output Summary (Last 24 hours) at 03/01/2024 1826 Last data filed at 03/01/2024 1638 Gross per 24 hour   Intake 500 ml  Output --  Net 500 ml        Physical Exam: Vital Signs There were no vitals taken for this visit.   Constitutional: No distress . Vital signs reviewed. HEENT: NCAT, EOMI, oral membranes moist, exophthalmus  Neck: supple Cardiovascular: RRR without murmur. No JVD    Respiratory/Chest: CTA Bilaterally without wheezes or rales. Normal effort    GI/Abdomen: BS +, non-tender, non-distended Ext: no clubbing, cyanosis, or edema Psych: restless and distracted  Skin: No evidence of breakdown, no evidence of rash Neurologic: pt alert, mumbles, oriented to person, not place. Can follow simple commands. Limited insight and awareness.  CN exam non-focal. Moves all 4 limbs. Senses pain in all 4's. No abnl resting tone. No obvious limb ataxia.  Musc: Full ROM, No pain with AROM or PROM in the neck, trunk, or extremities. Posture appropriate     Assessment/Plan: 1. Functional deficits due to CVA now with AMS  Care Tool:  Bathing              Bathing assist       Upper Body Dressing/Undressing Upper body dressing        Upper body assist      Lower Body Dressing/Undressing Lower body dressing  Lower body assist       Toileting Toileting    Toileting assist       Transfers Chair/bed transfer  Transfers assist           Locomotion Ambulation   Ambulation assist              Walk 10 feet activity   Assist           Walk 50 feet activity   Assist           Walk 150 feet activity   Assist           Walk 10 feet on uneven surface  activity   Assist           Wheelchair     Assist               Wheelchair 50 feet with 2 turns activity    Assist            Wheelchair 150 feet activity     Assist          There were no vitals taken for this visit.  Medical Problem List and Plan: 1. 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             -patient may shower  -10/13--readmit to inpatient rehab (interrupted stay after syncope)             -ELOS/Goals: still ~ 15-20 days, min assist to supervision goals with PT, OT, SLP 2.  Antithrombotics: -DVT/anticoagulation:  Mechanical: Sequential compression devices, below knee Bilateral lower extremities Pharmaceutical: Lovenox              -antiplatelet therapy: Aspirin  3. Pain Management: Tylenol  prn  4. Mood/Behavior/Sleep: LCSW to follow for evaluation and support when available.              -antipsychotic agents: Wellbutrin              - sleep chart 5. Neuropsych/cognition: This patient is not capable of making decisions on his own behalf.             -hope to see further improvement in arousal/cognition as Pb tapers off.  6. Skin/Wound Care: Routine pressure relief measures 7. Fluids/Electrolytes/Nutrition: Monitor intake and output.  Follow-up chemistries in AM.             - Ensure+ high protein +multivitamin+folic acid               -encourage adequate PO 8.  Hypertension: on home Amlodipine  10 mg and lisinopril  40 mg 9. BPH: Flomax  0.4 mg 10. HLD: LDL 111 goal < 70--Zetia  10 mg and atorvastatin  80 mg 11.  Poststroke dysphagia: SLP consult continue dysphagia 3 12.  Tobacco abuse: nicotine patch 7 mg  13.  Polysubstance abuse: Tox screen positive for cocaine and marijuana. 14.  Acute alcohol withdrawal: History of dependence drinking at least 6 beers daily. On 10/7 pt experienced restlessness/agitation and bradycardia concerning for ETOH withdrawal.              -continue Thiamine   -10/13 placed on phenobarbital taper which has been completed              15.  Hypokalemia:K+replaced with 40 mEq on 10/8. Now 3.5 and stable. Continue to monitor  16. Syncopal, unresponsive episode 10/11. (BP 43/34 at the time) -neuro work up negative for acute infarct -monitor orthostatic  vs -TEDS and abdominal binder to  help maintain bp -encourage PO, monitor I/O's. Off IVF currently -labs in AM    LOS: 0 days A FACE TO FACE EVALUATION WAS PERFORMED  This progress note was moved into the rehab chart for the purpose of maintaining appropriate chart documentation.  Frank Fischer 03/01/2024, 6:26 PM

## 2024-03-01 NOTE — Plan of Care (Signed)
   Problem: Education: Goal: Knowledge of General Education information will improve Description Including pain rating scale, medication(s)/side effects and non-pharmacologic comfort measures Outcome: Progressing

## 2024-03-02 ENCOUNTER — Encounter (HOSPITAL_COMMUNITY): Payer: Self-pay | Admitting: Physical Medicine & Rehabilitation

## 2024-03-02 ENCOUNTER — Other Ambulatory Visit: Payer: Self-pay

## 2024-03-02 DIAGNOSIS — I952 Hypotension due to drugs: Secondary | ICD-10-CM

## 2024-03-02 DIAGNOSIS — I63312 Cerebral infarction due to thrombosis of left middle cerebral artery: Secondary | ICD-10-CM

## 2024-03-02 NOTE — Evaluation (Signed)
 Occupational Therapy Assessment and Plan  Patient Details  Name: Frank Fischer MRN: [id number] Date of Birth: April 08, 1961  OT Diagnosis: apraxia, cognitive deficits, disturbance of vision, hemiplegia affecting dominant side, and sensory loss Rehab Potential: Rehab Potential (ACUTE ONLY): Good ELOS: 14 days   Today's Date: 03/02/2024 OT Individual Time: 1045-1200 OT Individual Time Calculation (min): 75 min      Pt seen for initial evaluation and ADL training with a focus on  R side awareness, safety awareness and balance. His wife Vena present for the first half of the session. Pt much more verbally expressive today even saying short sentences that were fairly comprehensible. Pt completed self care, standing, and stand pivot transfers w/c >< toilet.   Good effort in activities and good initiation with use of R side but very limited by R side sensory and coordination deficits along with severe visual deficits.   Engaged in a R visual scanning task with max cues and hand over hand guiding with a peg board activity.  Reviewed role of OT, discussed POC and pt's goals, and ELOS with pt and his wife. Pt resting in w/c With all needs met and belt alarm set.     Hospital Problem: Principal Problem:   CVA (cerebral vascular accident) [facility])   Past Medical History:  Past Medical History:  Diagnosis Date   Arthritis    Cervical stenosis of spine    Chronic headaches    Dental caries    periodontal disease   Frequency of urination    History of cerebral infarction 11-28-2017  per pt never had symptoms   per MRI imaging 10-31-2016 2 small chronic lacunar infartions in the right frontal periventricular white matter , and chronic left medial orbital fracture (as seen 2014 MRI)   Hypertension    Noncompliance with medication regimen    Prostate cancer [facility]) urologist-  dr winter/  oncologist-  dr patrcia   dx 07-21-2017--- Stage T1c, Gleason 4+3,  PSA 13.40,  vol 49.7cc--  scheduled for  radioactive seed implants 12-03-2017   Past Surgical History:  Past Surgical History:  Procedure Laterality Date   ABDOMINAL EXPLORATION SURGERY  age 81   per pt had Small Bowel Resection and Appendectomy   APPENDECTOMY     ARM SURGERY Left 1999   REPAIR INJURY-- ARM CUT ABOUT OFF   IR CATHETER TUBE CHANGE  10/28/2021   IR CT HEAD LTD  02/21/2024   IR GUIDED DRAIN W CATHETER PLACEMENT  09/03/2021   IR PERCUTANEOUS ART THROMBECTOMY/INFUSION INTRACRANIAL INC DIAG ANGIO  02/21/2024   IR US  GUIDANCE  09/03/2021   IR US  GUIDE VASC ACCESS RIGHT  02/21/2024   PROSTATE BIOPSY  07/21/2017  dr winter office   RADIOACTIVE SEED IMPLANT N/A 12/03/2017   Procedure: RADIOACTIVE SEED IMPLANT/BRACHYTHERAPY IMPLANT;  Surgeon: Devere Lonni Righter, MD;  Location: [facility];  Service: Urology;  Laterality: N/A;  ONLY NEEDS 120 MIN   RADIOLOGY WITH ANESTHESIA N/A 02/21/2024   Procedure: RADIOLOGY WITH ANESTHESIA;  Surgeon: Radiologist, Medication, MD;  Location: [facility] OR;  Service: Radiology;  Laterality: N/A;   SPACE OAR INSTILLATION N/A 12/03/2017   Procedure: SPACE OAR INSTILLATION;  Surgeon: Devere Lonni Righter, MD;  Location: [facility];  Service: Urology;  Laterality: N/A;   TOOTH EXTRACTION N/A 09/21/2018   Procedure: DENTAL EXTRACTIONS WITH ALVEOLIPLASTY;  Surgeon: Sheryle Hamilton, DDS;  Location: [facility] OR;  Service: Oral Surgery;  Laterality: N/A;    Assessment & Plan Clinical Impression:  Frank  JAECEON Fischer is a 63 y.o. 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 Subsequently treated with TNK and thrombectomy was transition to progressive care.  Hospitalization complicated by severe alcohol withdrawal, initially treated with  Librium followed by phenobarbital, now slowly improving on phenobarbital taper.  He was discharged to CIR yesterday, today when physical therapy tried to get him up he became unresponsive, systolic blood pressure dropped to the 50s/60s range, improved to low 100 after he was put in Trendelenburg position, slowly improved, mental status improving however does have dysarthria from his current stroke   Patient transferred to CIR on 03/01/2024 .    Patient currently requires max with basic self-care skills secondary to decreased cardiorespiratoy endurance, motor apraxia and decreased coordination, decreased visual acuity, decreased visual perceptual skills, decreased visual motor skills, and hemianopsia, right side neglect, decreased awareness, decreased problem solving, decreased safety awareness, and decreased memory, and decreased sitting balance, decreased standing balance, decreased postural control, hemiplegia, and decreased balance strategies.  Prior to hospitalization, patient was independent.  Patient will benefit from skilled intervention to increase independence with basic self-care skills prior to discharge home with care partner.  Anticipate patient will require minimal physical assistance and follow up home health.  OT - End of Session Activity Tolerance: Tolerates 10 - 20 min activity with multiple rests Endurance Deficit: Yes Endurance Deficit Description: required rest breaks due to fatigue OT Assessment Rehab Potential (ACUTE ONLY): Good OT Barriers to Discharge: None OT Patient demonstrates impairments in the following area(s): Balance;Cognition;Endurance;Motor;Perception;Safety;Sensory;Vision OT Basic ADL's Functional Problem(s): Eating;Grooming;Bathing;Dressing;Toileting OT Advanced ADL's Functional Problem(s): None OT Transfers Functional Problem(s): Toilet;Tub/Shower OT Additional Impairment(s): Fuctional Use of Upper Extremity OT Plan OT Intensity: Minimum of 1-2 x/day, 45 to  90 minutes OT Frequency: 5 out of 7 days OT Duration/Estimated Length of Stay: 14 days OT Treatment/Interventions: Balance/vestibular training;Cognitive remediation/compensation;Discharge planning;DME/adaptive equipment instruction;Functional mobility training;Neuromuscular re-education;Patient/family education;Psychosocial support;Self Care/advanced ADL retraining;Therapeutic Activities;Therapeutic Exercise;UE/LE Strength taining/ROM;UE/LE Coordination activities;Visual/perceptual remediation/compensation OT Self Feeding Anticipated Outcome(s): supervision OT Basic Self-Care Anticipated Outcome(s): min A OT Toileting Anticipated Outcome(s): min A OT Bathroom Transfers Anticipated Outcome(s): CGA OT Recommendation Recommendations for Other Services: None Patient destination: Home Follow Up Recommendations: Home health OT Equipment Recommended: Tub/shower bench;3 in 1 bedside comode   OT Evaluation Precautions/Restrictions  Precautions Precautions: Fall Precaution/Restrictions Comments: R hemi with inattention Restrictions Weight Bearing Restrictions Per Provider Order: No General   Vital Signs Therapy Vitals Temp: 98.1 F (36.7 C) Temp Source: Oral Pulse Rate: 60 Resp: 18 BP: 119/65 Patient Position (if appropriate): Lying Oxygen Therapy SpO2: 100 % O2 Device: Room Air Pain Pain Assessment Pain Scale: 0-10 Pain Score: 0-No pain Home Living/Prior Functioning Home Living Living Arrangements: Spouse/significant other Available Help at Discharge: Family Type of Home: House Home Access: Other (comment) Entrance Stairs-Number of Steps: 1 or 2 but initially stated none Home Layout: One level Bathroom Shower/Tub: Engineer, manufacturing systems: Standard Additional Comments: need to confirm info w/ wife due to expressive and receptive deficits, unsure of accuracy  Lives With: Spouse (wife [name]) Prior Function Level of Independence: Independent with basic ADLs,  Independent with gait, Independent with homemaking with ambulation, Independent with transfers  Able to Take Stairs?: Yes Driving: No Vision Baseline Vision/History: 0 No visual deficits Ability to See in Adequate Light: 3 Highly impaired Patient Visual Report:  Other (comment) (pt unable to clearly verbalize, decreased insight) Vision Assessment?: Wears glasses for reading;Yes Eye Alignment: Impaired (comment) (R eye inferiorly deviated) Ocular Range of Motion: Restricted on the right (B eyes do not pass midline from L side) Alignment/Gaze Preference: Gaze left Tracking/Visual Pursuits: Left eye does not track medially;Right eye does not track laterally Saccades: Other (comment) (unable to move eyes to the R) Convergence: Impaired (comment) Visual Fields: Right homonymous hemianopsia Diplopia Assessment: Other (comment) (unable to have pt express if he has diplopia, but highly likely based on obvious misalignment of eyes) Depth Perception: Overshoots;Undershoots (severely impaired depth perception) Perception  Perception: Impaired Praxis Praxis: Impaired Praxis Impairment Details: Motor planning;Organization Cognition Cognition Overall Cognitive Status: Difficult to assess (expressive aphasia/ apraxia - with strong attempts today to try to verbalize with some words and sentences comprehensible) Arousal/Alertness: Awake/alert Orientation Level: Person;Place;Situation;Nonverbal/unable to assess Person: Oriented Memory: Impaired Awareness: Impaired Problem Solving: Impaired Safety/Judgment: Impaired Brief Interview for Mental Status (BIMS) Repetition of Three Words (First Attempt): 1 Temporal Orientation: Year: Nonsensical Temporal Orientation: Month: Accurate within 5 days (MY MONTH pt unable to verbalize October but his birthday was 3 days ago.) Temporal Orientation: Day: No answer Recall: Sock: No answer Recall: Blue: No answer Recall: Bed: No answer BIMS Summary  Score: 99 Sensation Sensation Light Touch: Impaired Detail Central sensation comments: unable to feel pinch and tickle on L arm Light Touch Impaired Details: Impaired RUE;Impaired RLE Hot/Cold: Impaired by gross assessment Proprioception: Impaired Detail Proprioception Impaired Details: Impaired RUE (pt not able to utilize R hand in tasks well due to proprioceptive/kinesthetic deficits) Stereognosis: Impaired by gross assessment Coordination Gross Motor Movements are Fluid and Coordinated: No Fine Motor Movements are Fluid and Coordinated: No Coordination and Movement Description: grossly uncoordinated due to R hemiparesis, R inattention, impaired motor planning/sequencing, decreased balance/coordination, and poor awareness Finger Nose Finger Test: unable to follow test for either hand due to receptive aphasia/ motor apraxia Motor  Motor Motor: Hemiplegia;Motor apraxia;Motor perseverations;Motor impersistence Motor - Skilled Clinical Observations: R hemiparesis, R inattention, impaired motor planning/sequencing, decreased balance/coordination, and poor awareness  Trunk/Postural Assessment  Cervical Assessment Cervical Assessment: Exceptions to [facility] (forward head) Thoracic Assessment Thoracic Assessment: Exceptions to [facility] (rounded shoulders) Lumbar Assessment Lumbar Assessment: Exceptions to [facility] (posterior pelvic tilt) Postural Control Postural Control: Deficits on evaluation Trunk Control: R lean w/ fatigue Righting Reactions: delayed Protective Responses: delayed  Balance Balance Balance Assessed: Yes Static Sitting Balance Static Sitting - Balance Support: Feet supported;Bilateral upper extremity supported Static Sitting - Level of Assistance: 5: Stand by assistance (supervision) Dynamic Sitting Balance Dynamic Sitting - Balance Support: Feet supported;No upper extremity supported Dynamic Sitting - Level of Assistance: 5: Stand by assistance (supervision) Static Standing  Balance Static Standing - Balance Support: No upper extremity supported Static Standing - Level of Assistance: 4: Min assist Dynamic Standing Balance Dynamic Standing - Balance Support: No upper extremity supported;During functional activity Dynamic Standing - Level of Assistance: 4: Min assist Extremity/Trunk Assessment RUE Assessment Active Range of Motion (AROM) Comments: WFL General Strength Comments: 4-/5 pt has great difficulty utilizing R hand and arm in functional tasks due severely impaired sensation.  He is making strong attempts to use R hand. LUE Assessment LUE Assessment: Within Functional Limits Active Range of Motion (AROM) Comments: [facility]  Care Tool Care Tool Self Care Eating   Eating Assist Level: Maximal Assistance - Patient 25 - 49%    Oral Care    Oral Care Assist Level: Maximal assistance - Patient 25 - 49%  Bathing   Body parts bathed by patient: Chest;Abdomen;Right upper leg;Face;Right arm;Left upper leg Body parts bathed by helper: Left arm;Front perineal area;Buttocks;Right lower leg;Left lower leg   Assist Level: Moderate Assistance - Patient 50 - 74%    Upper Body Dressing(including orthotics)   What is the patient wearing?: Pull over shirt   Assist Level: Moderate Assistance - Patient 50 - 74%    Lower Body Dressing (excluding footwear)   What is the patient wearing?: Incontinence brief;Pants Assist for lower body dressing: Maximal Assistance - Patient 25 - 49%    Putting on/Taking off footwear   What is the patient wearing?: Non-skid slipper socks;Ted hose Assist for footwear: Total Assistance - Patient < 25%       Care Tool Toileting Toileting activity   Assist for toileting: Maximal Assistance - Patient 25 - 49%     Care Tool Bed Mobility Roll left and right activity   Roll left and right assist level: Contact Guard/Touching assist    Sit to lying activity        Lying to sitting on side of bed activity   Lying to sitting on side  of bed assist level: the ability to move from lying on the back to sitting on the side of the bed with no back support.: Contact Guard/Touching assist     Care Tool Transfers Sit to stand transfer   Sit to stand assist level: Minimal Assistance - Patient > 75%    Chair/bed transfer   Chair/bed transfer assist level: Minimal Assistance - Patient > 75%     Toilet transfer   Assist Level: Minimal Assistance - Patient > 75%     Care Tool Cognition  Expression of Ideas and Wants Expression of Ideas and Wants: 2. Frequent difficulty - frequently exhibits difficulty with expressing needs and ideas  Understanding Verbal and Non-Verbal Content Understanding Verbal and Non-Verbal Content: 2. Sometimes understands - understands only basic conversations or simple, direct phrases. Frequently requires cues to understand   Memory/Recall Ability Memory/Recall Ability : That he or she is in a hospital/hospital unit;Current season   Refer to Care Plan for Long Term Goals  SHORT TERM GOAL WEEK 1 OT Short Term Goal 1 (Week 1): Pt will be able to hold stand balance with CGA to wash bottom. OT Short Term Goal 2 (Week 1): pt will be able to pull pants over hips with mod A. OT Short Term Goal 3 (Week 1): Pt will don shirt with min A . OT Short Term Goal 4 (Week 1): Pt will demonstrate improved R side visual awareness to scan to his R side with min cues vs max cues needed on evaluation .  Recommendations for other services: None    Skilled Therapeutic Intervention ADL ADL Eating: Maximal assistance Grooming: Maximal assistance Upper Body Bathing: Moderate assistance Lower Body Bathing: Maximal assistance Upper Body Dressing: Moderate assistance Lower Body Dressing: Maximal assistance Toileting: Maximal assistance Where Assessed-Toileting: Teacher, adult education: Curator Method: Surveyor, minerals: Grab bars;Raised Scientist, research (physical sciences): Not  assessed Film/video editor: Not assessed Mobility  Bed Mobility Bed Mobility: Rolling Left;Supine to Sit Rolling Left: Supervision/Verbal cueing Supine to Sit: Contact Guard/Touching assist Transfers Sit to Stand: Minimal Assistance - Patient > 75% Stand to Sit: Minimal Assistance - Patient > 75%   Discharge Criteria: Patient will be discharged from OT if patient refuses treatment 3 consecutive times without medical reason, if treatment goals not met, if there is a  change in medical status, if patient makes no progress towards goals or if patient is discharged from hospital.  The above assessment, treatment plan, treatment alternatives and goals were discussed and mutually agreed upon: by patient and by family  Markee Remlinger 03/02/2024, 1:16 PM

## 2024-03-02 NOTE — Plan of Care (Signed)
  Problem: Consults Goal: RH STROKE PATIENT EDUCATION Description: See Patient Education module for education specifics  Outcome: Progressing   Problem: RH BOWEL ELIMINATION Goal: RH STG MANAGE BOWEL WITH ASSISTANCE Description: STG Manage Bowel with mod I Assistance. Outcome: Progressing Goal: RH STG MANAGE BOWEL W/MEDICATION W/ASSISTANCE Description: STG Manage Bowel with Medication with mod I Assistance. Outcome: Progressing   Problem: RH BLADDER ELIMINATION Goal: RH STG MANAGE BLADDER WITH ASSISTANCE Description: STG Manage Bladder With mod I Assistance Outcome: Progressing Goal: RH STG MANAGE BLADDER WITH MEDICATION WITH ASSISTANCE Description: STG Manage Bladder With Medication With mod I Assistance. Outcome: Progressing   Problem: RH SAFETY Goal: RH STG ADHERE TO SAFETY PRECAUTIONS W/ASSISTANCE/DEVICE Description: STG Adhere to Safety Precautions With cues Assistance/Device. Outcome: Progressing   Problem: RH KNOWLEDGE DEFICIT Goal: RH STG INCREASE KNOWLEDGE OF HYPERTENSION Description: Patient and spouse will be able to manage HTN using educational resources for medications and dietary modification independently Outcome: Progressing Goal: RH STG INCREASE KNOWLEDGE OF DYSPHAGIA/FLUID INTAKE Description: Patient and spouse will be able to manage dysphagia using educational resources for medications and dietary modification independently Outcome: Progressing Goal: RH STG INCREASE KNOWLEGDE OF HYPERLIPIDEMIA Description: Patient and spouse will be able to manage HLD using educational resources for medications and dietary modification independently Outcome: Progressing Goal: RH STG INCREASE KNOWLEDGE OF STROKE PROPHYLAXIS Description: Patient and spouse will be able to manage secondary risk using educational resources for medications and dietary modification independently Outcome: Progressing

## 2024-03-02 NOTE — Progress Notes (Signed)
 Inpatient Rehabilitation Center Individual Statement of Services  Patient Name:  Frank Fischer  Date:  03/02/2024  Welcome to the Inpatient Rehabilitation Center.  Our goal is to provide you with an individualized program based on your diagnosis and situation, designed to meet your specific needs.  With this comprehensive rehabilitation program, you will be expected to participate in at least 3 hours of rehabilitation therapies Monday-Friday, with modified therapy programming on the weekends.  Your rehabilitation program will include the following services:  Physical Therapy (PT), Occupational Therapy (OT), Speech Therapy (ST), 24 hour per day rehabilitation nursing, Therapeutic Recreaction (TR), Neuropsychology, Care Coordinator, Rehabilitation Medicine, Nutrition Services, and Pharmacy Services  Weekly team conferences will be held on Wednesday to discuss your progress.  Your Inpatient Rehabilitation Care Coordinator will talk with you frequently to get your input and to update you on team discussions.  Team conferences with you and your family in attendance may also be held.  Expected length of stay: 12-14 days  Overall anticipated outcome: supervision-CGA level  Depending on your progress and recovery, your program may change. Your Inpatient Rehabilitation Care Coordinator will coordinate services and will keep you informed of any changes. Your Inpatient Rehabilitation Care Coordinator's name and contact numbers are listed  below.  The following services may also be recommended but are not provided by the Inpatient Rehabilitation Center:   Home Health Rehabiltiation Services Outpatient Rehabilitation Services    Arrangements will be made to provide these services after discharge if needed.  Arrangements include referral to agencies that provide these services.  Your insurance has been verified to be:  Medicare & medicaid Your primary doctor is:  Contractor  Pertinent information  will be shared with your doctor and your insurance company.  Inpatient Rehabilitation Care Coordinator:  Frank Fischer, KEN [phone] or Frank Fischer  Information discussed with and copy given to patient by: Fischer Asberry MATSU, 03/02/2024, 9:48 AM

## 2024-03-02 NOTE — Evaluation (Signed)
 Physical Therapy Assessment and Plan  Patient Details  Name: Frank Fischer MRN: [id number] Date of Birth: 1961-01-24  PT Diagnosis: Abnormal posture, Abnormality of gait, Cognitive deficits, Coordination disorder, Difficulty walking, Hemiparesis dominant, Hemiplegia dominant, Impaired cognition, and Muscle weakness Rehab Potential: Fair ELOS: 12-14 days   Today's Date: 03/02/2024 PT Individual Time: 9153-9044 PT Individual Time Calculation (min): 69 min    Hospital Problem: Principal Problem:   CVA (cerebral vascular accident) [facility])   Past Medical History:  Past Medical History:  Diagnosis Date   Arthritis    Cervical stenosis of spine    Chronic headaches    Dental caries    periodontal disease   Frequency of urination    History of cerebral infarction 11-28-2017  per pt never had symptoms   per MRI imaging 10-31-2016 2 small chronic lacunar infartions in the right frontal periventricular white matter , and chronic left medial orbital fracture (as seen 2014 MRI)   Hypertension    Noncompliance with medication regimen    Prostate cancer [facility]) urologist-  dr winter/  oncologist-  dr patrcia   dx 07-21-2017--- Stage T1c, Gleason 4+3,  PSA 13.40,  vol 49.7cc--  scheduled for radioactive seed implants 12-03-2017   Past Surgical History:  Past Surgical History:  Procedure Laterality Date   ABDOMINAL EXPLORATION SURGERY  age 87   per pt had Small Bowel Resection and Appendectomy   APPENDECTOMY     ARM SURGERY Left 1999   REPAIR INJURY-- ARM CUT ABOUT OFF   IR CATHETER TUBE CHANGE  10/28/2021   IR CT HEAD LTD  02/21/2024   IR GUIDED DRAIN W CATHETER PLACEMENT  09/03/2021   IR PERCUTANEOUS ART THROMBECTOMY/INFUSION INTRACRANIAL INC DIAG ANGIO  02/21/2024   IR US  GUIDANCE  09/03/2021   IR US  GUIDE VASC ACCESS RIGHT  02/21/2024   PROSTATE BIOPSY  07/21/2017  dr winter office   RADIOACTIVE SEED IMPLANT N/A 12/03/2017   Procedure: RADIOACTIVE SEED IMPLANT/BRACHYTHERAPY IMPLANT;   Surgeon: Devere Lonni Righter, MD;  Location: [facility];  Service: Urology;  Laterality: N/A;  ONLY NEEDS 120 MIN   RADIOLOGY WITH ANESTHESIA N/A 02/21/2024   Procedure: RADIOLOGY WITH ANESTHESIA;  Surgeon: Radiologist, Medication, MD;  Location: [facility] OR;  Service: Radiology;  Laterality: N/A;   SPACE OAR INSTILLATION N/A 12/03/2017   Procedure: SPACE OAR INSTILLATION;  Surgeon: Devere Lonni Righter, MD;  Location: [facility];  Service: Urology;  Laterality: N/A;   TOOTH EXTRACTION N/A 09/21/2018   Procedure: DENTAL EXTRACTIONS WITH ALVEOLIPLASTY;  Surgeon: Sheryle Hamilton, DDS;  Location: [facility] OR;  Service: Oral Surgery;  Laterality: N/A;    Assessment & Plan Clinical Impression: Patient is a 63 y.o. year old male with PMHx of hx of multiple prior strokes (R sided, with residual mild L hemiplegia), prostate cancer (s/p brachytherapy, 2018), polysubstance use (marijuana, crack, tobacco, alcohol), HTN, depression, and BPH. Patient presented to [facility] on 02/21/2024 with a left gaze preference and right hemiparesis. In the Emergency Department, his NIHSS score was 25. The patient was last seen normal at 11:30 PM. According to the chart, the patient was found down after going to the bathroom with left gaze and right-sided weakness.  A CT head revealed hypodensity in the right frontal, bilateral, and parietal lobes with a hyperdense left MCA. A CT angiogram of the head and neck showed a left ICA and M1 occlusion. The patient was administered TNK and underwent mechanical thrombectomy successfully. An MRI confirmed a large subacute  left MCA infarct with a small petechial hemorrhage. Neurology recommended aspirin  alone due to the large size of the strokes. 2D echo revealed LVEF 60 to 65% with normal function. Hgb A1c 6.1.  UDS positive for THC and cocaine, Ethanol 130 (in ED).    The hospital course was complicated by severe agitation and an elevated CIWAscore, with  the patient having a history of daily alcohol use (six-pack per day). Prior to arrival, the patient was independent but did not drive and lived in a one-level home with a level entry with his spouse. He currently requires moderate assistance, +2 for physical assistance, +2 for safety/equipment, Cueing for sequencing for functional mobility.  Needs assist for power up, rise, steady for transfers. Therapy evaluations completed due to patient decreased functional mobility was admitted for a comprehensive rehab program. transferred to CIR on 02/27/2024 .     Patient currently requires mod to Dep with basic self-care skills secondary to decreased coordination and decreased motor planning, decreased visual acuity and decreased visual motor skills, decreased attention to right, right side neglect, and decreased motor planning, and decreased initiation, decreased attention, decreased problem solving, and delayed processing.  Prior to hospitalization, patient could complete BADL related task with independent .   Patient will benefit from skilled intervention to decrease level of assist with basic self-care skills prior to discharge home with care partner.  Anticipate patient will require 24 hour supervision and TBD.  Patient currently requires min with mobility secondary to muscle weakness, decreased cardiorespiratoy endurance, impaired timing and sequencing, abnormal tone, unbalanced muscle activation, decreased coordination, and decreased motor planning, decreased visual acuity, decreased midline orientation, decreased attention to right, right side neglect, and decreased motor planning, decreased attention, decreased awareness, decreased problem solving, decreased safety awareness, and decreased memory, and decreased standing balance, decreased postural control, hemiplegia, and decreased balance strategies.  Prior to hospitalization, patient was modified independent  with mobility and lived with Spouse (wife Frank Fischer)  in a House home.  Home access is 1 or 2 but initially stated noneOther (comment).  Patient will benefit from skilled PT intervention to maximize safe functional mobility, minimize fall risk, and decrease caregiver burden for planned discharge home with 24 hour supervision.  Anticipate patient will benefit from follow up OP at discharge.  PT - End of Session Activity Tolerance: Tolerates 30+ min activity with multiple rests Endurance Deficit: Yes Endurance Deficit Description: required rest breaks due to fatigue PT Assessment Rehab Potential (ACUTE/IP ONLY): Fair PT Barriers to Discharge: Decreased caregiver support;Home environment access/layout;Incontinence;Other (comments) PT Barriers to Discharge Comments: aphasia, poor safety awarness, R hemi, poor attention/awareness, decreased balance/coordination, unsure of home setup PT Patient demonstrates impairments in the following area(s): Balance;Edema;Endurance;Motor;Nutrition;Perception;Safety;Skin Integrity;Behavior PT Transfers Functional Problem(s): Bed Mobility;Bed to Chair;Car;Furniture PT Locomotion Functional Problem(s): Ambulation;Wheelchair Mobility;Stairs PT Plan PT Intensity: Minimum of 1-2 x/day ,45 to 90 minutes PT Frequency: 5 out of 7 days PT Duration Estimated Length of Stay: 12-14 days PT Treatment/Interventions: Ambulation/gait training;Discharge planning;Functional mobility training;Psychosocial support;Therapeutic Activities;Visual/perceptual remediation/compensation;Balance/vestibular training;Disease management/prevention;Neuromuscular re-education;Skin care/wound management;Therapeutic Exercise;Wheelchair propulsion/positioning;Cognitive remediation/compensation;DME/adaptive equipment instruction;Pain management;Splinting/orthotics;UE/LE Strength taining/ROM;Community reintegration;Functional electrical stimulation;Patient/family education;Stair training;UE/LE Coordination activities PT Transfers Anticipated Outcome(s):  supervision with LRAD PT Locomotion Anticipated Outcome(s): supervision with LRAD PT Recommendation Follow Up Recommendations: Outpatient PT Patient destination: Home Equipment Recommended: To be determined   PT Evaluation Precautions/Restrictions Precautions Precautions: Fall Precaution/Restrictions Comments: R hemi with inattention Restrictions Weight Bearing Restrictions Per Provider Order: No Pain Interference Pain Interference Pain Effect on Sleep: 0. Does not apply - I  have not had any pain or hurting in the past 5 days Pain Interference with Therapy Activities: 0. Does not apply - I have not received rehabilitationtherapy in the past 5 days Pain Interference with Day-to-Day Activities: 1. Rarely or not at all Home Living/Prior Functioning Home Living Living Arrangements: Spouse/significant other Available Help at Discharge: Family Type of Home: House Home Access: Other (comment) Entrance Stairs-Number of Steps: 1 or 2 but initially stated none Home Layout: One level Bathroom Shower/Tub: Tub/shower unit Additional Comments: need to confirm info w/ wife due to expressive and receptive deficits, unsure of accuracy  Lives With: Spouse (wife Frank Fischer) Prior Function Level of Independence: Independent with basic ADLs;Independent with gait;Independent with homemaking with ambulation;Independent with transfers  Able to Take Stairs?: Yes Driving: No Vision/Perception  Vision - History Ability to See in Adequate Light: 1 Impaired  Cognition Overall Cognitive Status: Difficult to assess Arousal/Alertness: Awake/alert Orientation Level: Oriented to person;Oriented to place Memory: Impaired Awareness: Impaired Problem Solving: Impaired Safety/Judgment: Impaired Sensation Sensation Light Touch: Appears Intact Hot/Cold: Not tested Proprioception: Appears Intact Stereognosis: Not tested Additional Comments: difficult to assess due to aphasia Coordination Gross Motor  Movements are Fluid and Coordinated: No Coordination and Movement Description: grossly uncoordinated due to R hemiparesis, R inattention, impaired motor planning/sequencing, decreased balance/coordination, and poor awareness Motor  Motor Motor: Hemiplegia Motor - Skilled Clinical Observations: R hemiparesis, R inattention, impaired motor planning/sequencing, decreased balance/coordination, and poor awareness  Trunk/Postural Assessment  Cervical Assessment Cervical Assessment: Exceptions to [facility] (forward head) Thoracic Assessment Thoracic Assessment: Exceptions to [facility] (rounded shoulders) Lumbar Assessment Lumbar Assessment: Exceptions to [facility] (posterior pelvic tilt) Postural Control Postural Control: Deficits on evaluation Trunk Control: R lean w/ fatigue Righting Reactions: delayed Protective Responses: delayed  Balance Balance Balance Assessed: Yes Static Sitting Balance Static Sitting - Balance Support: Feet supported;Bilateral upper extremity supported Static Sitting - Level of Assistance: 5: Stand by assistance (supervision) Dynamic Sitting Balance Dynamic Sitting - Balance Support: Feet supported;No upper extremity supported Dynamic Sitting - Level of Assistance: 5: Stand by assistance (supervision) Static Standing Balance Static Standing - Balance Support: No upper extremity supported Static Standing - Level of Assistance: 4: Min assist Dynamic Standing Balance Dynamic Standing - Balance Support: No upper extremity supported;During functional activity Dynamic Standing - Level of Assistance: 4: Min assist Extremity Assessment  RLE Assessment RLE Assessment: Exceptions to [facility] General Strength Comments: not formally tested; grossly 3-/5 LLE Assessment LLE Assessment: Exceptions to [facility] General Strength Comments: not formally tested; grossly 4/5  Care Tool Care Tool Bed Mobility Roll left and right activity   Roll left and right assist level: Contact Guard/Touching assist     Sit to lying activity        Lying to sitting on side of bed activity   Lying to sitting on side of bed assist level: the ability to move from lying on the back to sitting on the side of the bed with no back support.: Contact Guard/Touching assist     Care Tool Transfers Sit to stand transfer   Sit to stand assist level: Minimal Assistance - Patient > 75%    Chair/bed transfer   Chair/bed transfer assist level: Minimal Assistance - Patient > 75%    Car transfer   Car transfer assist level: Minimal Assistance - Patient > 75%      Care Tool Locomotion Ambulation   Assist level: 2 helpers Assistive device: No Device Max distance: 191ft  Walk 10 feet activity   Assist level: 2 helpers  Assistive device: No Device   Walk 50 feet with 2 turns activity   Assist level: 2 helpers Assistive device: No Device  Walk 150 feet activity   Assist level: 2 helpers Assistive device: No Device  Walk 10 feet on uneven surfaces activity Walk 10 feet on uneven surfaces activity did not occur: Safety/medical concerns (decreased balance, poor attention/awareness)      Stairs   Assist level: Moderate Assistance - Patient - 50 - 74% Stairs assistive device: 2 hand rails Max number of stairs: 4 (6in)  Walk up/down 1 step activity   Walk up/down 1 step (curb) assist level: Moderate Assistance - Patient - 50 - 74% Walk up/down 1 step or curb assistive device: 2 hand rails  Walk up/down 4 steps activity   Walk up/down 4 steps assist level: Moderate Assistance - Patient - 50 - 74% Walk up/down 4 steps assistive device: 2 hand rails  Walk up/down 12 steps activity Walk up/down 12 steps activity did not occur: Safety/medical concerns (decreased balance, poor attention/awareness)      Pick up small objects from floor Pick up small object from the floor (from standing position) activity did not occur: Safety/medical concerns (decreased balance, poor attention/awareness)      Wheelchair Is the  patient using a wheelchair?: Yes Type of Wheelchair: Manual   Wheelchair assist level: Dependent - Patient 0% Max wheelchair distance: 133ft  Wheel 50 feet with 2 turns activity   Assist Level: Dependent - Patient 0%  Wheel 150 feet activity   Assist Level: Dependent - Patient 0%    Refer to Care Plan for Long Term Goals  SHORT TERM GOAL WEEK 1 PT Short Term Goal 1 (Week 1): pt will transfer bed<>chair with LRAD and CGA PT Short Term Goal 2 (Week 1): pt will transfer sit<>stand with LRAD and CGA PT Short Term Goal 3 (Week 1): pt will ambulate 45ft with LRAD and CGA  Recommendations for other services: None   Skilled Therapeutic Intervention Evaluation completed (see details above and below) with education on PT POC and goals and individual treatment initiated with focus on functional mobility/transfers, generalized strengthening and endurance, dynamic standing balance/coordination, NMR, and ambulation. Received pt semi-reclined in bed, pt educated on PT evaluation, CIR policies, and therapy schedule and agreeable. Pt denied any pain but limited by expressive and receptive aphasia and flat affect. Noted pt with R inattention with L gaze preference throughout session. Of note, when pt initially stands, automatically begins turning to L (suspect due to inattention), requiring cues for redirection.   Provided pt with 18x18 manual WC. Removed bed posey belt and transferred semi-reclined<>sitting L EOB with HOB elevated and use of bedrails with CGA (noted mild R inattention). Donned second gown and performed squat<>pivot into WC with min A (assist to pivot hips completely into WC). Pt transported to/from room in [facility] dependently. Pt performed simulated car transfer without AD and min A. Stood with min A and ambulated 117ft without AD and min A fading to mod A with +2 for WC follow. Pt ambulating with narrow BOS, short, shuffling steps and with poor carry over with cues to increase stride length. Also  noted increased R lean with fatigue.   Stood at stairs and navigated 4 6in steps with bilateral handrails and mod A (assist to place RUE on railing). Pt opting to ascend and descend with a step through pattern but failed to put foot completley on step, despite max cues, resulting in posterior LOB. Pt also required cues  for safety when turning, attempting to step down step while still facing the wrong direction (called +2 over for safety concerns).   In dayroom, transferred on/off Nustep with min A (again pt failing to pivot hips completely into chair and landing on armrest). Pt performed seated BUE/BLE strengthening on Nustep at workload 3 for 10 minutes with emphasis on cardiovascular endurance and R attention - pt required assist to get RLE/RUE positioned on Nustep. Total of 413 steps, 0.2 miles, 41 SPM, and 1.5 METs.  Returned to room and set pt up to eat breakfast. Noted significant R inattention when eating, spilling and knocking over foot items. Concluded session with pt sitting in WC, needs within reach, and seatbelt alarm on. NT present to provide full supervision while pt ate.   Mobility Bed Mobility Bed Mobility: Rolling Left;Supine to Sit Rolling Left: Supervision/Verbal cueing Supine to Sit: Contact Guard/Touching assist Transfers Transfers: Sit to Stand;Stand to Sit;Stand Pivot Transfers Sit to Stand: Minimal Assistance - Patient > 75% Stand to Sit: Minimal Assistance - Patient > 75% Stand Pivot Transfers: Minimal Assistance - Patient > 75% Stand Pivot Transfer Details: Verbal cues for sequencing;Verbal cues for precautions/safety Stand Pivot Transfer Details (indicate cue type and reason): verbal cues for R attention and safety with transfer Transfer (Assistive device): None Locomotion  Gait Ambulation: Yes Gait Assistance: 2 Helpers Gait Distance (Feet): 155 Feet Assistive device: None Gait Assistance Details: Verbal cues for sequencing;Verbal cues for technique;Verbal cues  for gait pattern Gait Assistance Details: verbal cues to increase step length Gait Gait: Yes Gait Pattern: Impaired Gait Pattern: Step-to pattern;Decreased step length - right;Decreased step length - left;Decreased stance time - right;Decreased stride length;Poor foot clearance - left;Poor foot clearance - right;Trunk flexed;Lateral trunk lean to right Gait velocity: decreased Stairs / Additional Locomotion Stairs: Yes Stairs Assistance: Moderate Assistance - Patient 50 - 74% Stair Management Technique: Two rails;Alternating pattern Number of Stairs: 4 Height of Stairs: 6 Wheelchair Mobility Wheelchair Mobility: Yes Wheelchair Assistance: Dependent - Patient 0% Wheelchair Parts Management: Needs assistance Distance: 113ft   Discharge Criteria: Patient will be discharged from PT if patient refuses treatment 3 consecutive times without medical reason, if treatment goals not met, if there is a change in medical status, if patient makes no progress towards goals or if patient is discharged from hospital.  The above assessment, treatment plan, treatment alternatives and goals were discussed and mutually agreed upon: by patient  Therisa CHRISTELLA Delorse Therisa Delorse PT, DPT 03/02/2024, 12:22 PM

## 2024-03-02 NOTE — Plan of Care (Signed)
 Problem: RH Balance Goal: LTG: Patient will maintain dynamic sitting balance (OT) Description: LTG:  Patient will maintain dynamic sitting balance with assistance during activities of daily living (OT) Flowsheets (Taken 03/02/2024 1323) LTG: Pt will maintain dynamic sitting balance during ADLs with: Supervision/Verbal cueing Goal: LTG Patient will maintain dynamic standing with ADLs (OT) Description: LTG:  Patient will maintain dynamic standing balance with assist during activities of daily living (OT)  Flowsheets (Taken 03/02/2024 1323) LTG: Pt will maintain dynamic standing balance during ADLs with: Contact Guard/Touching assist   Problem: Sit to Stand Goal: LTG:  Patient will perform sit to stand in prep for activites of daily living with assistance level (OT) Description: LTG:  Patient will perform sit to stand in prep for activites of daily living with assistance level (OT) Flowsheets (Taken 03/02/2024 1323) LTG: PT will perform sit to stand in prep for activites of daily living with assistance level: Contact Guard/Touching assist   Problem: RH Eating Goal: LTG Patient will perform eating w/assist, cues/equip (OT) Description: LTG: Patient will perform eating with assist, with/without cues using equipment (OT) Flowsheets (Taken 03/02/2024 1323) LTG: Pt will perform eating with assistance level of: Supervision/Verbal cueing   Problem: RH Grooming Goal: LTG Patient will perform grooming w/assist,cues/equip (OT) Description: LTG: Patient will perform grooming with assist, with/without cues using equipment (OT) Flowsheets (Taken 03/02/2024 1323) LTG: Pt will perform grooming with assistance level of: Supervision/Verbal cueing   Problem: RH Bathing Goal: LTG Patient will bathe all body parts with assist levels (OT) Description: LTG: Patient will bathe all body parts with assist levels (OT) Flowsheets (Taken 03/02/2024 1323) LTG: Pt will perform bathing with assistance level/cueing:  Contact Guard/Touching assist   Problem: RH Dressing Goal: LTG Patient will perform upper body dressing (OT) Description: LTG Patient will perform upper body dressing with assist, with/without cues (OT). Flowsheets (Taken 03/02/2024 1323) LTG: Pt will perform upper body dressing with assistance level of: Contact Guard/Touching assist Goal: LTG Patient will perform lower body dressing w/assist (OT) Description: LTG: Patient will perform lower body dressing with assist, with/without cues in positioning using equipment (OT) Flowsheets (Taken 03/02/2024 1323) LTG: Pt will perform lower body dressing with assistance level of: Minimal Assistance - Patient > 75%   Problem: RH Toileting Goal: LTG Patient will perform toileting task (3/3 steps) with assistance level (OT) Description: LTG: Patient will perform toileting task (3/3 steps) with assistance level (OT)  Flowsheets (Taken 03/02/2024 1323) LTG: Pt will perform toileting task (3/3 steps) with assistance level: Contact Guard/Touching assist   Problem: RH Vision Goal: RH LTG Vision Consulting civil engineer) Flowsheets (Taken 03/02/2024 1323) LTG: Vision Goals: Pt will be able to turn his head to scan to the R with only occasional cues to compensate for visual field loss.   Problem: RH Functional Use of Upper Extremity Goal: LTG Patient will use RT/LT upper extremity as a (OT) Description: LTG: Patient will use right/left upper extremity as a stabilizer/gross assist/diminished/nondominant/dominant level with assist, with/without cues during functional activity (OT) Flowsheets (Taken 03/02/2024 1323) LTG: Use of upper extremity in functional activities: RUE as diminished level LTG: Pt will use upper extremity in functional activity with assistance level of: Supervision/Verbal cueing   Problem: RH Toilet Transfers Goal: LTG Patient will perform toilet transfers w/assist (OT) Description: LTG: Patient will perform toilet transfers with assist, with/without  cues using equipment (OT) Flowsheets (Taken 03/02/2024 1323) LTG: Pt will perform toilet transfers with assistance level of: Contact Guard/Touching assist   Problem: RH Tub/Shower Transfers Goal: LTG Patient will  perform tub/shower transfers w/assist (OT) Description: LTG: Patient will perform tub/shower transfers with assist, with/without cues using equipment (OT) Flowsheets (Taken 03/02/2024 1323) LTG: Pt will perform tub/shower stall transfers with assistance level of: Contact Guard/Touching assist   Problem: RH Awareness Goal: LTG: Patient will demonstrate awareness during functional activites type of (OT) Description: LTG: Patient will demonstrate awareness during functional activites type of (OT) Flowsheets (Taken 03/02/2024 1323) Patient will demonstrate awareness during functional activites type of: Emergent LTG: Patient will demonstrate awareness during functional activites type of (OT): Supervision

## 2024-03-02 NOTE — Progress Notes (Signed)
 PROGRESS NOTE   Subjective/Complaints:  Aphasic but alert no accurate with Y/N, mumbles   ROS: Limited due to cognitive/behavioral   Objective:   No results found.  Recent Labs    02/29/24 0556 03/01/24 0220  WBC 7.1 7.1  HGB 15.8 16.1  HCT 46.7 49.3  PLT 280 266   Recent Labs    02/29/24 0556 03/01/24 0220  NA 144 138  K 3.9 3.9  CL 107 105  CO2 21* 18*  GLUCOSE 94 87  BUN 14 15  CREATININE 1.13 0.99  CALCIUM  8.9 8.8*   No intake or output data in the 24 hours ending 03/02/24 0723       Physical Exam: Vital Signs Blood pressure 129/78, pulse 65, temperature 98.7 F (37.1 C), temperature source Oral, resp. rate 18, height 5' 7 (1.702 m), weight 84 kg, SpO2 99%.    General: No acute distress Mood and affect are appropriate Heart: Regular rate and rhythm no rubs murmurs or extra sounds Lungs: Clear to auscultation, breathing unlabored, no rales or wheezes Abdomen: Positive bowel sounds, soft nontender to palpation, nondistended Extremities: No clubbing, cyanosis, or edema 3- RIght Delt bi tri grip, 3- RIght hip flex , 4- Knee ext Aphasia limits exam   Skin: No evidence of breakdown, no evidence of rash Neurologic: pt alert, mumbles, oriented to person, not place. Can follow simple commands. Limited insight and awareness.  CN cannot cooperate. Moves all 4 limbs. Senses pain in all 4's. No abnl resting tone. No obvious limb ataxia.  Musc: Full ROM, No pain with AROM or PROM in the neck, trunk, or extremities. Posture appropriate     Assessment/Plan: 1. Functional deficits due to CVA now with AMS  Care Tool:  Bathing              Bathing assist       Upper Body Dressing/Undressing Upper body dressing        Upper body assist      Lower Body Dressing/Undressing Lower body dressing            Lower body assist       Toileting Toileting    Toileting assist        Transfers Chair/bed transfer  Transfers assist           Locomotion Ambulation   Ambulation assist              Walk 10 feet activity   Assist           Walk 50 feet activity   Assist           Walk 150 feet activity   Assist           Walk 10 feet on uneven surface  activity   Assist           Wheelchair     Assist               Wheelchair 50 feet with 2 turns activity    Assist            Wheelchair 150 feet activity     Assist  Blood pressure 129/78, pulse 65, temperature 98.7 F (37.1 C), temperature source Oral, resp. rate 18, height 5' 7 (1.702 m), weight 84 kg, SpO2 99%.  Medical Problem List and Plan: 1. 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             -patient may shower  -10/13--readmit to inpatient rehab (interrupted stay after syncope)             -ELOS/Goals: still ~ 15-20 days, min assist to supervision goals with PT, OT, SLP 2.  Antithrombotics: -DVT/anticoagulation:  Mechanical: Sequential compression devices, below knee Bilateral lower extremities Pharmaceutical: Lovenox              -antiplatelet therapy: Aspirin  3. Pain Management: Tylenol  prn  4. Mood/Behavior/Sleep: LCSW to follow for evaluation and support when available.              -antipsychotic agents: Wellbutrin              - sleep chart 5. Neuropsych/cognition: This patient is not capable of making decisions on his own behalf.             -hope to see further improvement in arousal/cognition as Pb tapers off.  6. Skin/Wound Care: Routine pressure relief measures 7. Fluids/Electrolytes/Nutrition: Monitor intake and output.  Follow-up chemistries in AM.             - Ensure+ high protein +multivitamin+folic acid               -encourage adequate PO 8.  Hypertension: on home Amlodipine  10 mg and lisinopril  40 mg Vitals:    03/01/24 2205 03/02/24 0542  BP: (!) 133/95 129/78  Pulse: 68 65  Resp: 17 18  Temp: 98.8 F (37.1 C) 98.7 F (37.1 C)  SpO2: 100% 99%   Controlled on lisinopril  10mg  and no amlodipine  9. BPH: Flomax  0.4 mg 10. HLD: LDL 111 goal < 70--Zetia  10 mg and atorvastatin  80 mg 11.  Poststroke dysphagia: SLP consult continue dysphagia 3 12.  Tobacco abuse: nicotine patch 7 mg  13.  Polysubstance abuse: Tox screen positive for cocaine and marijuana. 14.  Acute alcohol withdrawal: History of dependence drinking at least 6 beers daily. On 10/7 pt experienced restlessness/agitation and bradycardia concerning for ETOH withdrawal.              -continue Thiamine   -10/13 placed on phenobarbital taper which has been completed              15.  Hypokalemia:K+replaced with 40 mEq on 10/8. Now 3.5 and stable. Continue to monitor     Latest Ref Rng & Units 03/01/2024    2:20 AM 02/29/2024    5:56 AM 02/28/2024    6:48 AM  BMP  Glucose 70 - 99 mg/dL 87  94  898   BUN 8 - 23 mg/dL 15  14  12    Creatinine 0.61 - 1.24 mg/dL 9.00  8.86  8.96   Sodium 135 - 145 mmol/L 138  144  140   Potassium 3.5 - 5.1 mmol/L 3.9  3.9  4.0   Chloride 98 - 111 mmol/L 105  107  105   CO2 22 - 32 mmol/L 18  21  21    Calcium  8.9 - 10.3 mg/dL 8.8  8.9  9.1     16. Syncopal, unresponsive episode 10/11. (BP 43/34 at the time) -neuro work up negative for acute infarct -monitor orthostatic vs -  TEDS and abdominal binder to help maintain bp -encourage PO, monitor I/O's. Off IVF currently -labs in AM    LOS: 1 days A FACE TO FACE EVALUATION WAS PERFORMED  This progress note was moved into the rehab chart for the purpose of maintaining appropriate chart documentation.  Prentice BRAVO Jordynn Perrier 03/02/2024, 7:23 AM

## 2024-03-02 NOTE — Progress Notes (Signed)
 Inpatient Rehabilitation  Patient resuming Inpatient Rehabilitation Program following an interruption in stay from 02/28/2024-03/01/2024.  Patient will be discussed at the next scheduled conference on Wednesday 10/15/20205.  Patient information will continue to be reviewed and entered into eRehab system by [facility]. Lonni DEAN., CCC/SLP, PPS Coordinator.  Information including medical coding, functional ability, and quality indicators will be reviewed and updated through discharge.

## 2024-03-02 NOTE — Progress Notes (Signed)
 Inpatient Rehabilitation Care Coordinator Assessment and Plan Patient Details  Name: Frank Fischer MRN: [id number] Date of Birth: [birth year]  Today's Date: 03/02/2024  Hospital Problems: Principal Problem:   CVA (cerebral vascular accident) [facility])  Past Medical History:  Past Medical History:  Diagnosis Date   Arthritis    Cervical stenosis of spine    Chronic headaches    Dental caries    periodontal disease   Frequency of urination    History of cerebral infarction 11-28-2017  per pt never had symptoms   per MRI imaging 10-31-2016 2 small chronic lacunar infartions in the right frontal periventricular white matter , and chronic left medial orbital fracture (as seen 2014 MRI)   Hypertension    Noncompliance with medication regimen    Prostate cancer [facility]) urologist-  dr winter/  oncologist-  dr patrcia   dx 07-21-2017--- Stage T1c, Gleason 4+3,  PSA 13.40,  vol 49.7cc--  scheduled for radioactive seed implants 12-03-2017   Past Surgical History:  Past Surgical History:  Procedure Laterality Date   ABDOMINAL EXPLORATION SURGERY  age 30   per pt had Small Bowel Resection and Appendectomy   APPENDECTOMY     ARM SURGERY Left 1999   REPAIR INJURY-- ARM CUT ABOUT OFF   IR CATHETER TUBE CHANGE  10/28/2021   IR CT HEAD LTD  02/21/2024   IR GUIDED DRAIN W CATHETER PLACEMENT  09/03/2021   IR PERCUTANEOUS ART THROMBECTOMY/INFUSION INTRACRANIAL INC DIAG ANGIO  02/21/2024   IR US  GUIDANCE  09/03/2021   IR US  GUIDE VASC ACCESS RIGHT  02/21/2024   PROSTATE BIOPSY  07/21/2017  dr winter office   RADIOACTIVE SEED IMPLANT N/A 12/03/2017   Procedure: RADIOACTIVE SEED IMPLANT/BRACHYTHERAPY IMPLANT;  Surgeon: Devere Lonni Righter, MD;  Location: [facility];  Service: Urology;  Laterality: N/A;  ONLY NEEDS 120 MIN   RADIOLOGY WITH ANESTHESIA N/A 02/21/2024   Procedure: RADIOLOGY WITH ANESTHESIA;  Surgeon: Radiologist, Medication, MD;  Location: [facility] OR;  Service: Radiology;   Laterality: N/A;   SPACE OAR INSTILLATION N/A 12/03/2017   Procedure: SPACE OAR INSTILLATION;  Surgeon: Devere Lonni Righter, MD;  Location: [facility];  Service: Urology;  Laterality: N/A;   TOOTH EXTRACTION N/A 09/21/2018   Procedure: DENTAL EXTRACTIONS WITH ALVEOLIPLASTY;  Surgeon: Sheryle Hamilton, DDS;  Location: [facility] OR;  Service: Oral Surgery;  Laterality: N/A;   Social History:  reports that he has been smoking cigarettes. He has a 10 pack-year smoking history. He has never used smokeless tobacco. He reports current alcohol use of about 21.0 standard drinks of alcohol per week. He reports current drug use. Drug: Marijuana.  Family / Support Systems Marital Status: Married Patient Roles: Spouse, Other (Comment) (sibling) Spouse/Significant Other: Frank Fischer [phone] Other Supports: Frank Fischer-brother [phone] Anticipated Caregiver: [name] Ability/Limitations of Caregiver: Wife works and is a Oceanographer: Other (Comment) (working on 24/7 care) Family Dynamics: Close with family and wife reports she will be his primary caregiver and can work on [employer] for him since has medicaid now.  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 Wife reports he was disabled but independent he just did not drive, but did all the rest of his care. Recent Psychosocial Issues: other health issues-hx-CVA's and had prostate cancer in the past Psychiatric History: Hx-depression/anxiety takes medications for this. Once is able to participate with neuro-psych will place on the list to be been while here Substance Abuse History: Pt has positive for ETOH-THC and cocaine upon admission will address this with along with neuro-psych  Patient / Family Perceptions, Expectations & Goals Pt/Family understanding of illness & functional limitations: Wife is able to explain [name]'s stroke she is here daily and talks with the MD's involved and feels understands his plan moving forward. She is a Lawyer by trade and knows the medical issues. Pt seems to be aware he is in a hospital and seems to know he had a stroke Premorbid pt/family roles/activities: husband, sibling, retiree, freind, etc Anticipated changes in roles/activities/participation: resume Pt/family expectations/goals: Wife states:  I will be assisting and am working on getting an aide while I work to be with him.  Community Resources [employer]: None Premorbid Home Care/DME Agencies: None Transportation available at discharge: wife Is the patient able to respond to transportation needs?: Other (comment) (proxy wife answered) In the past 12 months, has lack of transportation kept you from medical appointments or from getting medications?: No In the past 12 months, has lack of transportation kept you from meetings, work, or from getting things needed for daily living?: No Resource referrals recommended: Neuropsychology  Discharge Planning Living Arrangements: Spouse/significant other Support Systems:  Spouse/significant other, Friends/neighbors, Other relatives Type of Residence: Private residence Insurance Resources: [employer], [employer] (specify county) Architect: SSD, Family Support Financial Screen Referred: No Living Expenses: Lives with family Money Management: Spouse, Patient Does the patient have any problems obtaining your medications?: No Home Management: wife Patient/Family Preliminary Plans: Return home with wife who is able to assist she is by trade a Lawyer. She does work but is working on plan for care for pt. Aware being evaluated today and goals being set for stay here. Will update tomorrow after team conference to give goals and target discharge date Care Coordinator Barriers to Discharge: Insurance for SNF coverage, Medication compliance Care Coordinator Anticipated Follow Up Needs: HH/OP  Clinical Impression Obtained assessment information from wife due to pt has aphasic from stroke. Wife will be his caregiver but does work some during the week. She is aware he will need 24/7 care at discharge and is currently working on this. Will work on discharge needs and update tomorrow after team conference. Will continue to re-assess pt while here and have neuro-psych see when appropriate  Mako Pelfrey, [place] MATSU 03/02/2024, 9:45 AM

## 2024-03-02 NOTE — Progress Notes (Signed)
 Inpatient Rehabilitation Admission Medication Review by a Pharmacist  A complete drug regimen review was completed for this patient to identify any potential clinically significant medication issues.  High Risk Drug Classes Is patient taking? Indication by Medication  Antipsychotic No   Anticoagulant Yes Lovenox  - DVT px  Antibiotic No   Opioid No   Antiplatelet Yes ASA/plavix  3 months then asa alone -  CVA  Hypoglycemics/insulin No   Vasoactive Medication Yes Lisinopril  - HTN  Chemotherapy No   Other Yes Albuterol  - short of breath Lipitor /zetia  - HLD Nicotine patch - tobacco Flomax  - BPH Folic acid /MVI/thiamine  - supplements     Type of Medication Issue Identified Description of Issue Recommendation(s)  Drug Interaction(s) (clinically significant)     Duplicate Therapy     Allergy     No Medication Administration End Date     Incorrect Dose     Additional Drug Therapy Needed     Significant med changes from prior encounter (inform family/care partners about these prior to discharge).    Other       Clinically significant medication issues were identified that warrant physician communication and completion of prescribed/recommended actions by midnight of the next day:  No  Name of provider notified for urgent issues identified:   Provider Method of Notification:     Pharmacist comments:   Time spent performing this drug regimen review (minutes):  20  Sergio Batch, PharmD, [place], AAHIVP, CPP Infectious Disease Pharmacist 03/01/2024 11:08 AM

## 2024-03-02 NOTE — IPOC Note (Signed)
 Overall Plan of Care [facility]) Patient Details Name: Frank Fischer MRN: [id number] DOB: 1960-09-07  Admitting Diagnosis: Acute CVA (cerebrovascular accident) [facility])  Hospital Problems: Principal Problem:   Acute CVA (cerebrovascular accident) [facility]) Active Problems:   Syncope, vasovagal     Functional Problem List: Nursing Bladder, Bowel, Edema, Endurance, Medication Management, Pain, Safety  PT    OT Endurance, Balance, Cognition, Motor  SLP    TR         Basic ADL's: OT Bathing, Dressing, Toileting     Advanced  ADL's: OT       Transfers: PT    OT Tub/Shower, Toilet     Locomotion: PT       Additional Impairments: OT Fuctional Use of Upper Extremity (R hemi for dominate extremity)  SLP        TR      Anticipated Outcomes Item Anticipated Outcome  Self Feeding ModI  Swallowing      Basic self-care  ModI  Toileting  ModI   Bathroom Transfers ModI  Bowel/Bladder  manage bowels with medications/ manage bladder with toileting assistance  Transfers     Locomotion     Communication     Cognition     Pain  <4 w/ prns  Safety/Judgment  manage safety with supervision-min assistance   Therapy Plan:   OT Intensity: Minimum of 1-2 x/day, 45 to 90 minutes OT Frequency: 5 out of 7 days OT Duration/Estimated Length of Stay: 3-4weeks     Team Interventions: Nursing Interventions Patient/Family Education, Bladder Management, Bowel Management, Disease Management/Prevention, Pain Management, Medication Management, Discharge Planning, Dysphagia/Aspiration Precaution Training  PT interventions    OT Interventions Balance/vestibular training, Neuromuscular re-education, Therapeutic Exercise, Self Care/advanced ADL retraining, DME/adaptive equipment instruction, Cognitive remediation/compensation, UE/LE Strength taining/ROM, Functional electrical stimulation, Patient/family education, Splinting/orthotics, UE/LE Coordination activities, Functional mobility  training, Therapeutic Activities, Visual/perceptual remediation/compensation  SLP Interventions    TR Interventions    SW/CM Interventions Discharge Planning, Psychosocial Support, Patient/Family Education   Barriers to Discharge MD  Medical stability  Nursing Decreased caregiver support, Home environment access/layout, Incontinence Discharge Living Setting: Patient's home, Lives with (comment) (spouse)  Type of Home at Discharge: Apartment  Discharge Home Layout: One level  Discharge Home Access: Level entry  PT      OT      SLP      SW       Team Discharge Planning: Destination: PT-  ,OT- Home , SLP-  Projected Follow-up: PT- , OT-  Other (comment) (TBD), SLP-  Projected Equipment Needs: PT- , OT-  (TBD), SLP-  Equipment Details: PT- , OT-TBD Patient/family involved in discharge planning: PT-  ,  OT-Family member/caregiver, Patient, SLP-   MD ELOS: 10-14d Medical Rehab Prognosis:  Fair Assessment: The patient has been admitted for CIR therapies with the diagnosis of CVA. The team will be addressing functional mobility, strength, stamina, balance, safety, adaptive techniques and equipment, self-care, bowel and bladder mgt, patient and caregiver education, BP management. Goals have been set at Mod I/Sup. Anticipated discharge destination is Home.        See Team Conference Notes for weekly updates to the plan of care to

## 2024-03-02 NOTE — Progress Notes (Signed)
 Patient ID: Frank Fischer, male   DOB: 22-Jan-1961, 63 y.o.   MRN: [id number] Met with the patient to review current medical situation, rehab process, team conference and plan of care. Aphasia and cognition deficits impaired conversation. Patient is declining meals, opting for sweets and drinks. Reviewed dysphagia HH diet and alternative options for nutritional intake. Reinforced safety measures; environment modification.  Information notebook left in room for wife; patient acknowledged understanding. Continue to follow along to address educational needs to facilitate preparation for discharge. [name]

## 2024-03-03 MED ORDER — INFLUENZA VAC SPLIT HIGH-DOSE 0.5 ML IM SUSY
0.5000 mL | PREFILLED_SYRINGE | INTRAMUSCULAR | Status: DC
  Filled 2024-03-03: qty 0.5

## 2024-03-03 NOTE — Progress Notes (Signed)
 Physical Therapy Session Note  Patient Details  Name: Frank Fischer MRN: [id number] Date of Birth: 1960-10-05  Today's Date: 03/03/2024 PT Individual Time: 9195-9151 PT Individual Time Calculation (min): 44 min   Short Term Goals: Week 1:  PT Short Term Goal 1 (Week 1): pt will transfer bed<>chair with LRAD and CGA PT Short Term Goal 2 (Week 1): pt will transfer sit<>stand with LRAD and CGA PT Short Term Goal 3 (Week 1): pt will ambulate 55ft with LRAD and CGA  Skilled Therapeutic Interventions/Progress Updates: Patient semi-reclined in bed on entrance to room. Patient alert and agreeable to PT session.   Patient with no complaints of pain  Therapeutic Activity: Bed Mobility: Pt performed supine<sit on EOB with supervision and HOB elevated. Transfers: Pt performed sit<>stand pivot transfers throughout session with minA and VC for step placement and hand placement.  Patient demonstrates increased fall risk as noted by score of 10/56 on Berg Balance Scale.  (<36= high risk for falls, close to 100%; 37-45 significant >80%; 46-51 moderate >50%; 52-55 lower >25%)  TUG (avg = 34.31s) from hi/low mat with uncontrolled descent (no UE use) and no AD and with CGA/close supervision throughout  - 40.19s  - 35.99s  - 26.76s Pt required return to room due to brief change (straps broke). Pt dependently transported in [facility] and nsg assisted. Pt stood in RW with VC to maintain upright posture during personal care (use of urinal with nsg holding). Pt maxA to clean anterior/posterior pericare for safety all while standing and cues to avoid R lean into PTA.  Patient sitting in WC at end of session with brakes locked, bed alarm set, and all needs within reach.      Therapy Documentation Precautions:  Precautions Precautions: Fall Precaution/Restrictions Comments: R hemi with inattention Restrictions Weight Bearing Restrictions Per Provider Order: No  Therapy/Group: Individual Therapy  Peni Rupard PTA 03/03/2024, 12:53 PM

## 2024-03-03 NOTE — Plan of Care (Signed)
  Problem: RH Swallowing Goal: LTG Patient will consume least restrictive diet using compensatory strategies with assistance (SLP) Description: LTG:  Patient will consume least restrictive diet using compensatory strategies with assistance (SLP) Flowsheets (Taken 03/03/2024 1300) LTG: Pt Patient will consume least restrictive diet using compensatory strategies with assistance of (SLP): Supervision   Problem: RH Expression Communication Goal: LTG Patient will express needs/wants via multi-modal(SLP) Description: LTG:  Patient will express needs/wants via multi-modal communication (gestures/written, etc) with cues (SLP) Flowsheets (Taken 03/03/2024 1300) LTG: Patient will express needs/wants via multimodal communication (gestures/written, etc) with cueing (SLP): Moderate Assistance - Patient 50 - 74% Goal: LTG Patient will verbally express basic/complex needs(SLP) Description: LTG:  Patient will verbally express basic/complex needs, wants or ideas with cues  (SLP) Flowsheets (Taken 03/03/2024 1300) LTG: Patient will verbally express basic/complex needs, wants or ideas (SLP): Moderate Assistance - Patient 50 - 74% Goal: LTG Patient will increase speech intelligibility (SLP) Description: LTG: Patient will increase speech intelligibility at word/phrase/conversation level with cues, % of the time (SLP) Flowsheets (Taken 03/03/2024 1300) LTG: Patient will increase speech intelligibility (SLP): Moderate Assistance - Patient 50 - 74% Level: Phrase Percent of time patient will use intelligible speech: 75

## 2024-03-03 NOTE — IPOC Note (Signed)
 Overall Plan of Care [facility]) Patient Details Name: Frank Fischer MRN: [id number] DOB: 11/24/1960  Admitting Diagnosis: CVA (cerebral vascular accident) [facility])  Hospital Problems: Principal Problem:   CVA (cerebral vascular accident) [facility])     Functional Problem List: Nursing Bladder, Bowel, Safety  PT Balance, Edema, Endurance, Motor, Nutrition, Perception, Safety, Skin Integrity, Behavior  OT Balance, Cognition, Endurance, Motor, Perception, Safety, Sensory, Vision  SLP Cognition, Linguistic, Motor, Nutrition  TR         Basic ADL's: OT Eating, Grooming, Bathing, Dressing, Toileting     Advanced  ADL's: OT None     Transfers: PT Bed Mobility, Bed to Chair, Car, Occupational psychologist, Research scientist (life sciences): PT Ambulation, Psychologist, prison and probation services, Stairs     Additional Impairments: OT Fuctional Use of Upper Extremity  SLP Swallowing, Communication, Social Cognition expression, comprehension Memory  TR      Anticipated Outcomes Item Anticipated Outcome  Self Feeding supervision  Swallowing  Sup A   Basic self-care  min A  Toileting  min A   Bathroom Transfers CGA  Bowel/Bladder  incontinent  Transfers  supervision with LRAD  Locomotion  supervision with LRAD  Communication  Mod A  Cognition  Min A  Pain  not in pain  Safety/Judgment  telesitter in place   Therapy Plan: PT Intensity: Minimum of 1-2 x/day ,45 to 90 minutes PT Frequency: 5 out of 7 days PT Duration Estimated Length of Stay: 12-14 days OT Intensity: Minimum of 1-2 x/day, 45 to 90 minutes OT Frequency: 5 out of 7 days OT Duration/Estimated Length of Stay: 14 days SLP Intensity: Minumum of 1-2 x/day, 30 to 90 minutes SLP Frequency: 3 to 5 out of 7 days SLP Duration/Estimated Length of Stay: 14 days   Team Interventions: Nursing Interventions Patient/Family Education, Bladder Management  PT interventions Ambulation/gait training, Discharge planning, Functional mobility training,  Psychosocial support, Therapeutic Activities, Visual/perceptual remediation/compensation, Balance/vestibular training, Disease management/prevention, Neuromuscular re-education, Skin care/wound management, Therapeutic Exercise, Wheelchair propulsion/positioning, Cognitive remediation/compensation, DME/adaptive equipment instruction, Pain management, Splinting/orthotics, UE/LE Strength taining/ROM, Community reintegration, Development worker, international aid stimulation, Patient/family education, Museum/gallery curator, UE/LE Coordination activities  OT Interventions Warden/ranger, Cognitive remediation/compensation, Discharge planning, DME/adaptive equipment instruction, Functional mobility training, Neuromuscular re-education, Patient/family education, Psychosocial support, Self Care/advanced ADL retraining, Therapeutic Activities, Therapeutic Exercise, UE/LE Strength taining/ROM, UE/LE Coordination activities, Visual/perceptual remediation/compensation  SLP Interventions Cognitive remediation/compensation, Dysphagia/aspiration precaution training, Internal/external aids, Speech/Language facilitation, Cueing hierarchy, Multimodal communication approach, Patient/family education, Functional tasks  TR Interventions    SW/CM Interventions     Barriers to Discharge MD  Medical stability  Nursing Incontinence 1 level apt wtih spouse  PT Decreased caregiver support, Home environment access/layout, Incontinence, Other (comments) aphasia, poor safety awarness, R hemi, poor attention/awareness, decreased balance/coordination, unsure of home setup  OT None    SLP      SW Insurance for SNF coverage, Medication compliance     Team Discharge Planning: Destination: PT-Home ,OT- Home , SLP-  Projected Follow-up: PT-Outpatient PT, OT-  Home health OT, SLP-Home Health SLP, Outpatient SLP, 24 hour supervision/assistance Projected Equipment Needs: PT-To be determined, OT- Tub/shower bench, 3 in 1 bedside comode,  SLP-None recommended by SLP Equipment Details: PT- , OT-  Patient/family involved in discharge planning: PT- Patient,  OT-Family member/caregiver, Patient, SLP-Patient  MD ELOS: 7-10d Medical Rehab Prognosis:  Fair Assessment: The patient has been admitted for CIR therapies with the diagnosis of CVA. The team will be addressing functional mobility, strength, stamina, balance, safety, adaptive techniques  and equipment, self-care, bowel and bladder mgt, patient and caregiver education, BP management . Goals have been set at MinA/S. Anticipated discharge destination is Home.        See Team Conference Notes for weekly updates to the plan of care

## 2024-03-03 NOTE — Progress Notes (Signed)
 Speech Language Pathology Daily Session Note  Patient Details  Name: Frank Fischer MRN: [id number] Date of Birth: 01/12/1961  Today's Date: 03/03/2024 SLP Individual Time: 0950-1030 SLP Individual Time Calculation (min): 40 min  Short Term Goals: Week 1: SLP Short Term Goal 1 (Week 1): Pt will answer mildly complex yes/no questions via multimodal communication with 75% accuracy provided mod A cues SLP Short Term Goal 2 (Week 1): Patient will consume least restrictive diet with use of safe swallowing strategies given sup A SLP Short Term Goal 3 (Week 1): Pt will verbalize basic biographical/ personal information with 50% acc and max A SLP Short Term Goal 4 (Week 1): Pt will complete functional naming tasks w/ max A SLP Short Term Goal 5 (Week 1): Patient will attend to midline during functional tasks given max multimodal A  Skilled Therapeutic Interventions: Skilled therapy session focused on dysphagia and communication goals. SLP observed patient with D3 solids and thin liquids. Patient with adequate oral clearance and timely mastication. Patient with x1 instance of strong cough which may be indicative of bolus misdirection. Continue current diet. SLP targeted communication goals through receptive and expressive language tasks. Patient unable to recall name despite totalA and observed occasional attempts to approximate numbers 1-5 and days of the week. During functional naming, patient appoximatated single word banana with neologisms for remainder. Receptively, patient followed directions with 50% accuracy given modA and answered yes/no questions with 80% accuracy. Patient left in [facility] with alarm set and call bell in reach. Continue POC  Pain Pain Assessment Pain Scale: 0-10 Pain Score: 0-No pain  Therapy/Group: Individual Therapy  Trevone Prestwood M.A., CCC-SLP 03/03/2024, 12:55 PM

## 2024-03-03 NOTE — Progress Notes (Signed)
 Occupational Therapy Session Note  Patient Details  Name: Frank Fischer MRN: [id number] Date of Birth: [birth year]  Today's Date: 03/03/2024 OT Individual Time: 9069-9049 and 1040-1130 OT Individual Time Calculation (min): 20 min and 50 min   Short Term Goals: Week 1:  OT Short Term Goal 1 (Week 1): Pt will be able to hold stand balance with CGA to wash bottom. OT Short Term Goal 2 (Week 1): pt will be able to pull pants over hips with mod A. OT Short Term Goal 3 (Week 1): Pt will don shirt with min A . OT Short Term Goal 4 (Week 1): Pt will demonstrate improved R side visual awareness to scan to his R side with min cues vs max cues needed on evaluation .  Skilled Therapeutic Interventions/Progress Updates:    Visit 1: pain: no c/o pain  Pt received in chair and initially did not want to do therapy.   Pt finally agreed and taken to the gym.  Worked on transfers with min A to mat.  Session focused on R visual scanning with head turns with total A and forced use of RUE, improved ability to grasp and release cones.  Hand off to SLP for next session.  Visit 2: pain: no c/o pain  Pt received in wc with wife present.  Pt declined need for toileting but I could smell a urine odor. Recommended he try to sit on the toilet.  Pt able to pivot to R to toilet with use of bar but with first having him turn his head to the R to ensure he is seeing the seat.  Pt transferred with min A.  Min to pull pants down.  Pt did not void and his brief was dry.  His wife did think his pants needed to be changed.  Max A to doff over feet.  Pt able to don L foot in and then R foot with mod A.  Mod A over hips with good attempts at trying to use his R hand.  Pt then taken to the sink for standing at the sink to cleanse his mouth. No teeth and tongue was white.  Pt handed brush into his L hand and with min/mod A for balancing at the sink and max cues and mod guiding A, pt brushed around gums and tongue.  Unable to remove  white coating.  Pt not able to look at himself in the mirror.  When I asked pt to look at himself his eyes would not look forward.  Unable to tell if pt did not want to or if his eyes could not focus, once sitting he did seem to gaze at his reflection.   Had pt stand again to wash hands with mod A to fully attend to R side.   Pt then taken to gym to do forced use of R arm activity on arm bike.  Pt guided in movement for first 2 minutes, and then he was able to continue for 30 seconds at a time. Pt able to push pedals at resistance 1.    Pt returned to room.  Pt in wc to rest. All needs met.  Call light in reach and alarm set   Therapy Documentation Precautions:  Precautions Precautions: Fall Precaution/Restrictions Comments: R hemi with inattention Restrictions Weight Bearing Restrictions Per Provider Order: No       ADL: ADL Eating: Maximal assistance Grooming: Maximal assistance Upper Body Bathing: Moderate assistance Lower Body Bathing: Maximal assistance Upper Body Dressing: Moderate  assistance Lower Body Dressing: Maximal assistance Toileting: Maximal assistance Where Assessed-Toileting: Teacher, adult education: Curator Method: Surveyor, minerals: Grab bars, Raised toilet seat Tub/Shower Transfer: Not assessed Film/video editor: Not assessed  Therapy/Group: Individual Therapy  Jai Bear 03/03/2024, 8:29 AM

## 2024-03-03 NOTE — Group Note (Signed)
 Patient Details Name: Frank Fischer MRN: [id number] DOB: [birth year] Today's Date: 03/03/2024  Time Calculation:   PT Group Time Calculation PT Group Start Time: 1330 PT Group Stop Time: 1430 PT Group Time Calculation (min): 60 min    Group Description: LE Group: Pt participated in LE therapeutic exercise group to address functional strengthening, endurance, and ROM to increase overall functional independence with mobility in a social setting.   Individual level documentation: Pt performed seated exercises including the following with cues for technique and exhale on exertion:   Ankle pumps/heel rises LAQ Kickball with bounce ball Seated marches Hip abd/add (if able simultaneous bilateral movement) Quad/hamstring co-contraction (bug smash) Hamstring pulls (partner supported) Modified leg press (pt holding theraband and extending knee)   Group cooled down performing hamstring stretches and ankle circles with emphasis on diaphragmatic breathing.   Pt transported back to room and remained in w/c with call bell within reach and needs met.   Pain: Pain Assessment Pain Scale: 0-10 Pain Score: 0-No pain  Precautions:     Mayrani Khamis 03/03/2024, 3:31 PM

## 2024-03-03 NOTE — Patient Care Conference (Signed)
 Inpatient RehabilitationTeam Conference and Plan of Care Update Date: 03/03/2024   Time: 10:21 AM    Patient Name: Frank Fischer      Medical Record Number: [id number]  Date of Birth: 06-Jan-1961 Sex: Male         Room/Bed: 4M04C/4M04C-01 Payor Info: Payor: MEDICARE / Plan: MEDICARE PART A AND B / Product Type: *No Product type* /    Admit Date/Time:  03/01/2024  8:08 PM  Primary Diagnosis:  CVA (cerebral vascular accident) [facility])  Hospital Problems: Principal Problem:   CVA (cerebral vascular accident) [facility])    Expected Discharge Date: Expected Discharge Date: 03/16/24  Team Members Present: Physician leading conference: Dr. Prentice Compton Social Worker Present: Rhoda Clement, LCSW Nurse Present: Barnie Ronde, RN PT Present: Sherlean Perks, PT;Dominic Sandoval, PTA OT Present: Recardo Maxwell, OT SLP Present: Recardo Mole, SLP PPS Coordinator present : Eleanor Colon, SLP     Current Status/Progress Goal Weekly Team Focus  Bowel/Bladder      Incontinent of bowel and bladder    Incontinence managed    Toileting protocol  Swallow/Nutrition/ Hydration   Dys 3/thin   supervision  diet tolerance, pt/family edu, aspiration precautions    ADL's   max A overall   min A overall   R side visual scanning and awareness, balance, RUE NMR, pt/fam education    Mobility   Bed mobility = CGA/supervision; Transfers = minA; Gait = min/modA + 2 helpers at eval; 4 steps modA   supervision  NMRE, pt family ed, dynamic standing balance, R sided awareness, gait    Communication   severe aphasia - communicates at word level   modA   multimodal communication as appropriate, pt/family edu, functional language tasks    Safety/Cognition/ Behavioral Observations               Pain      N/a          Skin      N/a           Discharge Planning:  Home with significant other who works as a Lawyer and is trying to get paid to work for pt. Pt wanting to go home soon, but  lacks awareness of his deficits and how much care he actually is. Neuro-psych to see while here   Team Discussion: Patient admitted post CVA with profound aphasia, right hemiparesis, no proprioception, visual sensory deficits, right neglect and poor safety awareness.  Patient on target to meet rehab goals: Currently needs min assist for stand pivot transfers. Needs CGA - supervision for bed mobility.    *See Care Plan and progress notes for long and short-term goals.   Revisions to Treatment Plan:  Dysphagia 3 thin liquid diet; full supervision   Teaching Needs: Safety, medications, dietary modification, transfers, toileting, etc.   Current Barriers to Discharge: Decreased caregiver support, Home enviroment access/layout, and Incontinence  Possible Resolutions to Barriers: Caregiver education     Medical Summary Current Status: Pt phasic, Right hemiparesis, BP improving  Barriers to Discharge: Medical stability;Hypotension   Possible Resolutions to [employer] Focus: Adjust BP, monitor orthostatic vitals, establish caegiver   Continued Need for Acute Rehabilitation Level of Care: The patient requires daily medical management by a physician with specialized training in physical medicine and rehabilitation for the following reasons: Direction of a multidisciplinary physical rehabilitation program to maximize functional independence : Yes Medical management of patient stability for increased activity during participation in an intensive rehabilitation regime.: Yes Analysis of laboratory values  and/or radiology reports with any subsequent need for medication adjustment and/or medical intervention. : Yes   I attest that I was present, lead the team conference, and concur with the assessment and plan of the team.   Fredericka Sober B 03/03/2024, 2:27 PM

## 2024-03-03 NOTE — Progress Notes (Signed)
 PROGRESS NOTE   Subjective/Complaints:  Aphasic but alert, + right neglect, perfect, go home  ROS: Limited due to cognitive/behavioral   Objective:   No results found.  Recent Labs    03/01/24 0220  WBC 7.1  HGB 16.1  HCT 49.3  PLT 266   Recent Labs    03/01/24 0220  NA 138  K 3.9  CL 105  CO2 18*  GLUCOSE 87  BUN 15  CREATININE 0.99  CALCIUM  8.8*    Intake/Output Summary (Last 24 hours) at 03/03/2024 0813 Last data filed at 03/02/2024 1800 Gross per 24 hour  Intake 770 ml  Output --  Net 770 ml         Physical Exam: Vital Signs Blood pressure 134/81, pulse 68, temperature 97.8 F (36.6 C), temperature source Oral, resp. rate 18, height 5' 7 (1.702 m), weight 84 kg, SpO2 97%.    General: No acute distress Mood and affect are appropriate Heart: Regular rate and rhythm no rubs murmurs or extra sounds Lungs: Clear to auscultation, breathing unlabored, no rales or wheezes Abdomen: Positive bowel sounds, soft nontender to palpation, nondistended Extremities: No clubbing, cyanosis, or edema 3- RIght Delt bi tri grip, 3- RIght hip flex , 4- Knee ext Aphasia limits exam   Skin: No evidence of breakdown, no evidence of rash Neurologic: pt alert, mumbles, oriented to person, not place. Can follow simple commands. Limited insight and awareness.  CN cannot cooperate. Moves all 4 limbs. Senses pain in all 4's. No abnl resting tone. No obvious limb ataxia.  Musc: Full ROM, No pain with AROM or PROM in the neck, trunk, or extremities. Posture appropriate     Assessment/Plan: 1. Functional deficits due to CVA now with AMS  Care Tool:  Bathing    Body parts bathed by patient: Chest, Abdomen, Right upper leg, Face, Right arm, Left upper leg   Body parts bathed by helper: Left arm, Front perineal area, Buttocks, Right lower leg, Left lower leg     Bathing assist Assist Level: Moderate Assistance -  Patient 50 - 74%     Upper Body Dressing/Undressing Upper body dressing   What is the patient wearing?: Pull over shirt    Upper body assist Assist Level: Moderate Assistance - Patient 50 - 74%    Lower Body Dressing/Undressing Lower body dressing      What is the patient wearing?: Incontinence brief, Pants     Lower body assist Assist for lower body dressing: Maximal Assistance - Patient 25 - 49%     Toileting Toileting    Toileting assist Assist for toileting: Maximal Assistance - Patient 25 - 49%     Transfers Chair/bed transfer  Transfers assist     Chair/bed transfer assist level: Minimal Assistance - Patient > 75%     Locomotion Ambulation   Ambulation assist      Assist level: 2 helpers Assistive device: No Device Max distance: 175ft   Walk 10 feet activity   Assist     Assist level: 2 helpers Assistive device: No Device   Walk 50 feet activity   Assist    Assist level: 2 helpers Assistive device: No Device  Walk 150 feet activity   Assist    Assist level: 2 helpers Assistive device: No Device    Walk 10 feet on uneven surface  activity   Assist Walk 10 feet on uneven surfaces activity did not occur: Safety/medical concerns (decreased balance, poor attention/awareness)         Wheelchair     Assist Is the patient using a wheelchair?: Yes Type of Wheelchair: Manual    Wheelchair assist level: Dependent - Patient 0% Max wheelchair distance: 166ft    Wheelchair 50 feet with 2 turns activity    Assist        Assist Level: Dependent - Patient 0%   Wheelchair 150 feet activity     Assist      Assist Level: Dependent - Patient 0%   Blood pressure 134/81, pulse 68, temperature 97.8 F (36.6 C), temperature source Oral, resp. rate 18, height 5' 7 (1.702 m), weight 84 kg, SpO2 97%.  Medical Problem List and Plan: 1. 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             -patient may shower  -10/13--readmit to inpatient rehab (interrupted stay after syncope) Team conference today please see physician documentation under team conference tab, met with team  to discuss problems,progress, and goals. Formulized individual treatment plan based on medical history, underlying problem and comorbidities.              -ELOS/Goals: still ~ 15-20 days, min assist to supervision goals with PT, OT, SLP 2.  Antithrombotics: -DVT/anticoagulation:  Mechanical: Sequential compression devices, below knee Bilateral lower extremities Pharmaceutical: Lovenox              -antiplatelet therapy: Aspirin  3. Pain Management: Tylenol  prn  4. Mood/Behavior/Sleep: LCSW to follow for evaluation and support when available.              -antipsychotic agents: Wellbutrin              - sleep chart 5. Neuropsych/cognition: This patient is not capable of making decisions on his own behalf.             -hope to see further improvement in arousal/cognition as Pb tapers off.  6. Skin/Wound Care: Routine pressure relief measures 7. Fluids/Electrolytes/Nutrition: Monitor intake and output.  Follow-up chemistries in AM.             - Ensure+ high protein +multivitamin+folic acid               -encourage adequate PO 8.  Hypertension: on home Amlodipine  10 mg and lisinopril  40 mg Vitals:   03/02/24 1300 03/02/24 2117  BP: 119/65 134/81  Pulse: 60 68  Resp: 18   Temp: 98.1 F (36.7 C) 97.8 F (36.6 C)  SpO2: 100% 97%   Controlled on lisinopril  10mg  and no amlodipine  9. BPH: Flomax  0.4 mg 10. HLD: LDL 111 goal < 70--Zetia  10 mg and atorvastatin  80 mg 11.  Poststroke dysphagia: SLP consult continue dysphagia 3 12.  Tobacco abuse: nicotine patch 7 mg  13.  Polysubstance abuse: Tox screen positive for cocaine and marijuana. 14.  Acute alcohol withdrawal: History of dependence drinking at least 6 beers daily. On 10/7 pt  experienced restlessness/agitation and bradycardia concerning for ETOH withdrawal.              -continue Thiamine   -10/13 placed on phenobarbital taper which has been completed  15.  Hypokalemia:K+replaced with 40 mEq on 10/8. Now 3.5 and stable. Continue to monitor     Latest Ref Rng & Units 03/01/2024    2:20 AM 02/29/2024    5:56 AM 02/28/2024    6:48 AM  BMP  Glucose 70 - 99 mg/dL 87  94  898   BUN 8 - 23 mg/dL 15  14  12    Creatinine 0.61 - 1.24 mg/dL 9.00  8.86  8.96   Sodium 135 - 145 mmol/L 138  144  140   Potassium 3.5 - 5.1 mmol/L 3.9  3.9  4.0   Chloride 98 - 111 mmol/L 105  107  105   CO2 22 - 32 mmol/L 18  21  21    Calcium  8.9 - 10.3 mg/dL 8.8  8.9  9.1     16. Syncopal, unresponsive episode 10/11. (BP 43/34 at the time) -neuro work up negative for acute infarct -monitor orthostatic vs -TEDS and abdominal binder to help maintain bp -encourage PO, monitor I/O's. Off IVF currently -labs in AM    LOS: 2 days A FACE TO FACE EVALUATION WAS PERFORMED  This progress note was moved into the rehab chart for the purpose of maintaining appropriate chart documentation.  Prentice BRAVO Devon Pretty 03/03/2024, 8:13 AM

## 2024-03-03 NOTE — Progress Notes (Signed)
 Patient ID: Frank Fischer, male   DOB: [birth year], 63 y.o.   MRN: [id number]  Met with pt and spoke with [name]-significant other to inform them of team conference with goals of CGA-min and target discharge date of 10/28. He is not happy with this and wants to leave sooner. [name] feels this will give him time to make progress and be more ready to go home. Both aware of his high fall risk due to his deficits. Will get [facility] referral paperwork completed and work on discharge needs. Continue to work on discharge needs.

## 2024-03-03 NOTE — Evaluation (Signed)
 Speech Language Pathology Assessment and Plan  Patient Details  Name: TU BAYLE MRN: [id number] Date of Birth: 1961-03-12  SLP Diagnosis: Aphasia;Dysarthria;Cognitive Impairments;Dysphagia  Rehab Potential: Good ELOS: 14 days   Today's Date: 03/03/2024 SLP Individual Time: 0950-1030 SLP Individual Time Calculation (min): 40 min   Hospital Problem: Principal Problem:   CVA (cerebral vascular accident) [facility])  Past Medical History:  Past Medical History:  Diagnosis Date   Arthritis    Cervical stenosis of spine    Chronic headaches    Dental caries    periodontal disease   Frequency of urination    History of cerebral infarction 11-28-2017  per pt never had symptoms   per MRI imaging 10-31-2016 2 small chronic lacunar infartions in the right frontal periventricular white matter , and chronic left medial orbital fracture (as seen 2014 MRI)   Hypertension    Noncompliance with medication regimen    Prostate cancer [facility]) urologist-  dr winter/  oncologist-  dr patrcia   dx 07-21-2017--- Stage T1c, Gleason 4+3,  PSA 13.40,  vol 49.7cc--  scheduled for radioactive seed implants 12-03-2017   Past Surgical History:  Past Surgical History:  Procedure Laterality Date   ABDOMINAL EXPLORATION SURGERY  age 72   per pt had Small Bowel Resection and Appendectomy   APPENDECTOMY     ARM SURGERY Left 1999   REPAIR INJURY-- ARM CUT ABOUT OFF   IR CATHETER TUBE CHANGE  10/28/2021   IR CT HEAD LTD  02/21/2024   IR GUIDED DRAIN W CATHETER PLACEMENT  09/03/2021   IR PERCUTANEOUS ART THROMBECTOMY/INFUSION INTRACRANIAL INC DIAG ANGIO  02/21/2024   IR US  GUIDANCE  09/03/2021   IR US  GUIDE VASC ACCESS RIGHT  02/21/2024   PROSTATE BIOPSY  07/21/2017  dr winter office   RADIOACTIVE SEED IMPLANT N/A 12/03/2017   Procedure: RADIOACTIVE SEED IMPLANT/BRACHYTHERAPY IMPLANT;  Surgeon: Devere Lonni Righter, MD;  Location: [facility];  Service: Urology;  Laterality: N/A;  ONLY NEEDS  120 MIN   RADIOLOGY WITH ANESTHESIA N/A 02/21/2024   Procedure: RADIOLOGY WITH ANESTHESIA;  Surgeon: Radiologist, Medication, MD;  Location: [facility] OR;  Service: Radiology;  Laterality: N/A;   SPACE OAR INSTILLATION N/A 12/03/2017   Procedure: SPACE OAR INSTILLATION;  Surgeon: Devere Lonni Righter, MD;  Location: [facility];  Service: Urology;  Laterality: N/A;   TOOTH EXTRACTION N/A 09/21/2018   Procedure: DENTAL EXTRACTIONS WITH ALVEOLIPLASTY;  Surgeon: Sheryle Hamilton, DDS;  Location: [facility] OR;  Service: Oral Surgery;  Laterality: N/A;    Assessment / Plan / Recommendation Clinical Impression   TESEAN STUMP is a 63 y.o. 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 Subsequently treated with TNK and thrombectomy was transition to progressive care. Hospitalization complicated by severe alcohol withdrawal, initially treated with Librium followed by phenobarbital, now slowly improving on phenobarbital taper. He was discharged to CIR yesterday, today when physical therapy tried to get him up he became unresponsive, systolic blood pressure dropped to the 50s/60s range, improved to low 100 after he was put in Trendelenburg position, slowly improved, mental status improving however does have dysarthria from his current stroke.  Cognitive/ Linguistic: Pt presents with moderate to severe expressive and receptive language deficits. SLP attempted to administered Western Aphasia Battery- Bedside Form however unable to be completed in  its entirety due to severity of deficits. Pt demonstrates relative strengths in auditory comprehension and answering simple yes/ no questions. He demonstrated some strengths in automatic speech; responding appropriately to  greeting and counting to 6.  He was able to complete minimal naming during a confrontational naming task. During formal assessment pt was unable to repeat of follow single and multi step directions. Speech errors consisted of neologisms. Pt inconsistently aware of errors. Informally, pt presented with spontaneous speech up to sentence level statins, I don't know where it's at He's a year older than me and your hair is pretty. He answered some biographical information including verbalizing his name and month of birth. Cognition was not assessed formally due to severity of language deficits. Pt aware of being in the hospital and dx of CVA. He also presents with a significant right inattention and was unable to scan to midline during assessment. Skilled intervention warranted to address language. Formal cognitive assessment may be beneficial as language improves.   Dysphagia: Patient presents with mild s/s oropharyngeal dysphagia. Limited oral mechanism exam completed due to difficulty following verbal and visual directions. Pt is edentulous. Pt consumed sips of thin liquid via straw with cough after swallow in 1/5 trials. Suspect he consumed a larger than average bolus in adverse trial. He consumed trial of Dys 3 demonstrating timely mastication and transit given edentulous status. SLP recommending continuation of Dys 3 diet and thin liquids with supervision. SLP warranted to ensure diet tolerance and potential for diet upgrade.   Motor Speech: Pt also presents with moderate dysarthria with oral motor deficits as mentioned above. Dysarthria c/b decreased vocal intensity and imprecise articulation. Overall speech intelligibility ~60 %with words and ~40- 50% with phrases and sentences. Pt also presents with groping in attempts of communication indicating possible motor planning deficits. Skilled intervention warranted to address motor speech deficits.    Skilled Therapeutic Interventions          BSE,  informal assessment measures, and portions of WAB bedside form administered. Please see full report for additional details.      SLP Assessment  Patient will need skilled Speech Lanaguage Pathology Services during CIR admission    Recommendations  Medication Administration: Whole meds with puree Supervision: Staff to assist with self feeding Compensations: Minimize environmental distractions;Slow rate;Small sips/bites Postural Changes and/or Swallow Maneuvers: Seated upright 90 degrees Oral Care Recommendations: Oral care BID Patient destination: Home Follow up Recommendations: Home Health SLP;Outpatient SLP;24 hour supervision/assistance Equipment Recommended: None recommended by SLP    SLP Frequency 3 to 5 out of 7 days   SLP Duration  SLP Intensity  SLP Treatment/Interventions 14 days  Minumum of 1-2 x/day, 30 to 90 minutes  Cognitive remediation/compensation;Dysphagia/aspiration precaution training;Internal/external aids;Speech/Language facilitation;Cueing hierarchy;Multimodal communication approach;Patient/family education;Functional tasks    Pain Pain Assessment Pain Scale: 0-10 Pain Score: 0-No pain  Prior Functioning Cognitive/Linguistic Baseline: Within functional limits Type of Home: House  Lives With: Spouse Available Help at Discharge: Family  SLP Evaluation Cognition Overall Cognitive Status: Difficult to assess Arousal/Alertness: Awake/alert Orientation Level: Oriented to person;Oriented to place Sustained Attention: Appears intact Sustained Attention Impairment: Functional basic Selective Attention: Impaired Selective Attention Impairment: Functional basic Memory: Impaired Awareness: Impaired Awareness Impairment: Intellectual impairment Problem Solving: Impaired Problem Solving Impairment: Functional basic Safety/Judgment: Impaired  Comprehension Auditory Comprehension Yes/No Questions: Impaired Basic Biographical Questions: 0-25% accurate Basic  Immediate Environment Questions: 0-24% accurate Complex Questions: 0-24% accurate Commands: Impaired One Step Basic Commands: 0-24% accurate Interfering Components: Visual impairments Visual Recognition/Discrimination Discrimination: Not  tested Reading Comprehension Reading Status: Not tested Expression Expression Primary Mode of Expression: Verbal Verbal Expression Overall Verbal Expression: Impaired Initiation: No impairment Automatic Speech: Name;Counting Level of Generative/Spontaneous Verbalization: Sentence Repetition: Impaired Level of Impairment: Word level Naming: Impairment Confrontation: Impaired Verbal Errors: Neologisms;Perseveration;Not aware of errors;Aware of errors Pragmatics: No impairment Interfering Components: Anatomical limitation Effective Techniques: Written cues Written Expression Dominant Hand: Right Oral Motor Oral Motor/Sensory Function Overall Oral Motor/Sensory Function: Within functional limits Motor Speech Overall Motor Speech: Impaired Respiration: Within functional limits Phonation: Low vocal intensity Articulation: Impaired Level of Impairment: Word Intelligibility: Intelligibility reduced Word: 25-49% accurate Phrase: 25-49% accurate Sentence: 25-49% accurate Motor Planning: Impaired Level of Impairment: Phrase Motor Speech Errors: Groping for words  Care Tool Care Tool Cognition Ability to hear (with hearing aid or hearing appliances if normally used Ability to hear (with hearing aid or hearing appliances if normally used): 0. Adequate - no difficulty in normal conservation, social interaction, listening to TV   Expression of Ideas and Wants Expression of Ideas and Wants: 2. Frequent difficulty - frequently exhibits difficulty with expressing needs and ideas   Understanding Verbal and Non-Verbal Content Understanding Verbal and Non-Verbal Content: 2. Sometimes understands - understands only basic conversations or simple, direct  phrases. Frequently requires cues to understand  Memory/Recall Ability Memory/Recall Ability : That he or she is in a hospital/hospital unit;Current season   Bedside Swallowing Assessment General Diet Prior to this Study: Dysphagia 3 (mechanical soft);Thin liquids (Level 0) Temperature Spikes Noted: No Respiratory Status: Room air History of Recent Intubation: No Behavior/Cognition: Alert;Cooperative;Requires cueing Oral Cavity - Dentition: Edentulous Self-Feeding Abilities: Needs assist Vision: Impaired for self-feeding Patient Positioning: Upright in bed Baseline Vocal Quality: Low vocal intensity Volitional Cough: Congested Volitional Swallow: Unable to elicit  Oral Care Assessment Oral Assessment  (WDL): Exceptions to WDL Lips: Symmetrical Teeth: Missing (Comment) Tongue: Moist Mucous Membrane(s): Pink;Moist Saliva: Moist, saliva free flowing Level of Consciousness: Alert Is patient on any of following O2 devices?: None of the above Nutritional status: Dysphagia Oral Assessment Risk : High Risk Ice Chips Ice chips: Not tested Oral Phase Functional Implications: Prolonged oral transit Pharyngeal Phase Impairments: Suspected delayed Swallow;Multiple swallows Thin Liquid Thin Liquid: Within functional limits Presentation: Straw Nectar Thick Nectar Thick Liquid: Not tested Honey Thick Honey Thick Liquid: Not tested Puree Puree: Not tested Solid Solid: Within functional limits Presentation: Spoon Oral Phase Impairments: Reduced lingual movement/coordination Oral Phase Functional Implications: Prolonged oral transit Pharyngeal Phase Impairments: Suspected delayed Swallow;Multiple swallows BSE Assessment Risk for Aspiration Impact on safety and function: Mild aspiration risk Other Related Risk Factors: Previous CVA;Cognitive impairment  Short Term Goals: Week 1: SLP Short Term Goal 1 (Week 1): Pt will answer mildly complex yes/no questions via multimodal  communication with 75% accuracy provided mod A cues SLP Short Term Goal 2 (Week 1): Patient will consume least restrictive diet with use of safe swallowing strategies given sup A SLP Short Term Goal 3 (Week 1): Pt will verbalize basic biographical/ personal information with 50% acc and max A SLP Short Term Goal 4 (Week 1): Pt will complete functional naming tasks w/ max A SLP Short Term Goal 5 (Week 1): Patient will attend to midline during functional tasks given max multimodal A  Refer to Care Plan for Long Term Goals  Recommendations for other services: None   Discharge Criteria: Patient will be discharged from SLP if patient refuses treatment 3 consecutive times without medical reason, if treatment goals not met, if there is a change  in medical status, if patient makes no progress towards goals or if patient is discharged from hospital.  The above assessment, treatment plan, treatment alternatives and goals were discussed and mutually agreed upon: No family available/patient unable  Joane GORMAN Fuss 03/03/2024, 12:42 PM

## 2024-03-04 LAB — CBC
HCT: 43.3 % (ref 39.0–52.0)
Hemoglobin: 14.7 g/dL (ref 13.0–17.0)
MCH: 31.7 pg (ref 26.0–34.0)
MCHC: 33.9 g/dL (ref 30.0–36.0)
MCV: 93.3 fL (ref 80.0–100.0)
Platelets: 293 K/uL (ref 150–400)
RBC: 4.64 MIL/uL (ref 4.22–5.81)
RDW: 13.5 % (ref 11.5–15.5)
WBC: 4.7 K/uL (ref 4.0–10.5)
nRBC: 0 % (ref 0.0–0.2)

## 2024-03-04 LAB — BASIC METABOLIC PANEL WITH GFR
Anion gap: 11 (ref 5–15)
BUN: 13 mg/dL (ref 8–23)
CO2: 23 mmol/L (ref 22–32)
Calcium: 8.9 mg/dL (ref 8.9–10.3)
Chloride: 101 mmol/L (ref 98–111)
Creatinine, Ser: 1.11 mg/dL (ref 0.61–1.24)
GFR, Estimated: >60 mL/min (ref >60)
Glucose, Bld: 102 mg/dL — ABNORMAL HIGH (ref 70–99)
Potassium: 3.4 mmol/L — ABNORMAL LOW (ref 3.5–5.1)
Sodium: 135 mmol/L (ref 135–145)

## 2024-03-04 MED ORDER — LORAZEPAM 2 MG/ML IJ SOLN: 1.0000 mg | Freq: Once | INTRAMUSCULAR | Status: DC

## 2024-03-04 MED ORDER — QUETIAPINE FUMARATE 25 MG PO TABS
25.0000 mg | ORAL_TABLET | Freq: Three times a day (TID) | ORAL | Status: DC | PRN
  Filled 2024-03-04: qty 1

## 2024-03-04 MED ORDER — LORAZEPAM 2 MG/ML IJ SOLN
1.0000 mg | Freq: Once | INTRAMUSCULAR | Status: AC
  Administered 2024-03-04: 1 mg via INTRAVENOUS
  Filled 2024-03-04: qty 1

## 2024-03-04 NOTE — Progress Notes (Signed)
 Patients seat bell alarm going off and this nurse walked in to patient getting out of alarm belt. Asked patient to sit back in chair as he was leaning forward and he stated  I want to go home Im going home. This nurse called NP to make aware that patient is non compliant with safety , has not ate much of anything today and refused therapies. As this nurse is on the phone with NP , patient seen walking down the hall. Immediately called Consulting civil engineer and security. Patient escorted back to room. New orders obtained by NP.    Nat Hacker LPN

## 2024-03-04 NOTE — IPOC Note (Signed)
 Overall Plan of Care [facility]) Patient Details Name: Frank Fischer MRN: [id number] DOB: 01-Dec-1960  Admitting Diagnosis: Acute CVA (cerebrovascular accident) [facility])  Hospital Problems: Principal Problem:   Acute CVA (cerebrovascular accident) [facility]) Active Problems:   Syncope, vasovagal     Functional Problem List: Nursing Bladder, Bowel, Edema, Endurance, Medication Management, Pain, Safety  PT    OT Endurance, Balance, Cognition, Motor  SLP    TR         Basic ADL's: OT Bathing, Dressing, Toileting     Advanced  ADL's: OT       Transfers: PT    OT Tub/Shower, Toilet     Locomotion: PT       Additional Impairments: OT Fuctional Use of Upper Extremity (R hemi for dominate extremity)  SLP        TR      Anticipated Outcomes Item Anticipated Outcome  Self Feeding ModI  Swallowing      Basic self-care  ModI  Toileting  ModI   Bathroom Transfers ModI  Bowel/Bladder  manage bowels with medications/ manage bladder with toileting assistance  Transfers     Locomotion     Communication     Cognition     Pain  <4 w/ prns  Safety/Judgment  manage safety with supervision-min assistance   Therapy Plan:   OT Intensity: Minimum of 1-2 x/day, 45 to 90 minutes OT Frequency: 5 out of 7 days OT Duration/Estimated Length of Stay: 3-4weeks     Team Interventions: Nursing Interventions Patient/Family Education, Bladder Management, Bowel Management, Disease Management/Prevention, Pain Management, Medication Management, Discharge Planning, Dysphagia/Aspiration Precaution Training  PT interventions    OT Interventions Balance/vestibular training, Neuromuscular re-education, Therapeutic Exercise, Self Care/advanced ADL retraining, DME/adaptive equipment instruction, Cognitive remediation/compensation, UE/LE Strength taining/ROM, Functional electrical stimulation, Patient/family education, Splinting/orthotics, UE/LE Coordination activities, Functional mobility  training, Therapeutic Activities, Visual/perceptual remediation/compensation  SLP Interventions    TR Interventions    SW/CM Interventions Discharge Planning, Psychosocial Support, Patient/Family Education   Barriers to Discharge MD  Medical stability and Medication compliance  Nursing Decreased caregiver support, Home environment access/layout, Incontinence Discharge Living Setting: Patient's home, Lives with (comment) (spouse)  Type of Home at Discharge: Apartment  Discharge Home Layout: One level  Discharge Home Access: Level entry  PT      OT      SLP      SW       Team Discharge Planning: Destination: PT-  ,OT- Home , SLP-  Projected Follow-up: PT- , OT-  Other (comment) (TBD), SLP-  Projected Equipment Needs: PT- , OT-  (TBD), SLP-  Equipment Details: PT- , OT-TBD Patient/family involved in discharge planning: PT-  ,  OT-Family member/caregiver, Patient, SLP-   MD ELOS: 2 weeks Medical Rehab Prognosis:  Fair Assessment: The patient has been admitted for CIR therapies with the diagnosis of acute cerebrovascular accident. The team will be addressing functional mobility, strength, stamina, balance, safety, adaptive techniques and equipment, self-care, bowel and bladder mgt, patient and caregiver education, management of heart rate and blood pressure. Goals have been set at supervision to min assist. Anticipated discharge destination is home with caregiver support.        See Team Conference Notes for weekly updates to the plan of care

## 2024-03-04 NOTE — Progress Notes (Addendum)
 Speech Language Pathology Daily Session Note  Patient Details  Name: Frank Fischer MRN: [id number] Date of Birth: [birth year]  Today's Date: 03/04/2024 SLP Individual Time: 1115-1200 SLP Individual Time Calculation (min): 45 min  Short Term Goals: Week 1: SLP Short Term Goal 1 (Week 1): Pt will answer mildly complex yes/no questions via multimodal communication with 75% accuracy provided mod A cues SLP Short Term Goal 2 (Week 1): Patient will consume least restrictive diet with use of safe swallowing strategies given sup A SLP Short Term Goal 3 (Week 1): Pt will verbalize basic biographical/ personal information with 50% acc and max A SLP Short Term Goal 4 (Week 1): Pt will complete functional naming tasks w/ max A SLP Short Term Goal 5 (Week 1): Patient will attend to midline during functional tasks given max multimodal A  Skilled Therapeutic Interventions: SLP conducted skilled therapy session targeting communication, cognition, and swallowing goals. Upon SLP entry, patient seen pulling on bedframe from chair, appearing to attempt to transfer however denies. Patient remained in w/c for duration of speech session. Patient with bag of candy corn (regular solids) in room, consuming them intermittently without difficulty. Suspect patient will soon be appropriate for diet upgrade, however given limited assessment of boluses this date, SLP will continue to assess in future sessions. During various confrontation naming tasks, patient benefited from max assist to name environmental objects with 50% acc. Verbal speech was often characterized by perseveration, specifically on words hamburger and hotdog. Patient was left in room with call bell in reach and alarm set. SLP will continue to target goals per plan of care.        Pain Pain Assessment Pain Scale: Faces Pain Score: 0-No pain Faces Pain Scale: No hurt  Therapy/Group: Individual Therapy  Shamone Winzer, M.A., CCC-SLP  Artelia Game A  Arish Redner 03/04/2024, 12:39 PM

## 2024-03-04 NOTE — Progress Notes (Addendum)
 Speech Language Pathology Daily Session Note  Patient Details  Name: Frank Fischer MRN: [id number] Date of Birth: [birth year]  Today's Date: 03/04/2024 SLP Individual Time: 9154-9091 SLP Individual Time Calculation (min): 23 min  Short Term Goals: Week 1: SLP Short Term Goal 1 (Week 1): Pt will answer mildly complex yes/no questions via multimodal communication with 75% accuracy provided mod A cues SLP Short Term Goal 2 (Week 1): Patient will consume least restrictive diet with use of safe swallowing strategies given sup A SLP Short Term Goal 3 (Week 1): Pt will verbalize basic biographical/ personal information with 50% acc and max A SLP Short Term Goal 4 (Week 1): Pt will complete functional naming tasks w/ max A SLP Short Term Goal 5 (Week 1): Patient will attend to midline during functional tasks given max multimodal A  Skilled Therapeutic Interventions: Skilled therapy session focused on communication goals. Upon entrance, patient seemingly frustrated, requesting to go home. SLP encouraged patient as able and explained importance of participating in therapy for functional gains before d/c. SLP attempted to target communication goals through automatic speech tasks and yes/no questions, however patient unwilling to participate. Patients phone rang, and patient with multiple spontaneous verbalizations to daughter including I want to go home, I need you to stay here, and I love you. Patients vocal quality was hoarse, however ~75% intelligible with occasional perseverations. At the end of the call, patient continued to refuse participation in ST, with cessation of verbalizations. Remaining 22 minutes of skilled ST missed due to patients refusal to participate and attend to SLP. Patient left in bed with alarm set and call bell in reach. Continue POC.    Pain None reported   Therapy/Group: Individual Therapy  Jarrett Chicoine M.A., CCC-SLP 03/04/2024, 7:40 AM

## 2024-03-04 NOTE — Progress Notes (Signed)
 Physical Therapy Session Note  Patient Details  Name: DOAK MAH MRN: [id number] Date of Birth: 1960-10-15  Today's Date: 03/04/2024 PT Individual Time: 1450-1500 PT Individual Time Calculation (min): 10 min  Today's Date: 03/04/2024 PT Missed Time: 35 Minutes Missed Time Reason: Patient unwilling to participate  Short Term Goals: Week 1:  PT Short Term Goal 1 (Week 1): pt will transfer bed<>chair with LRAD and CGA PT Short Term Goal 2 (Week 1): pt will transfer sit<>stand with LRAD and CGA PT Short Term Goal 3 (Week 1): pt will ambulate 54ft with LRAD and CGA  Skilled Therapeutic Interventions/Progress Updates: Patient sitting in WC on entry to room. Pt did not want to participate in therapy despite best efforts made to actively listen, educate pt on benefits of therapy to decrease caregiver burden and improve functional independence. Pt continued to decline therapy services this session. PTA encouraged pt not to leave inpatient rehabilitation as it is not recommended by care team at this time. Pt also encouraged to eat dinner as pt has not been eating much since admission per nsg report. PTA attempted again to have pt ambulate outside of room as pt stated he did want to walk again, but pt still declined interventions.  Pt left in [facility] with brakes locked, belt alarm set, and all needs in reach.       Therapy Documentation Precautions:  Precautions Precautions: Fall Precaution/Restrictions Comments: R hemi with inattention Restrictions Weight Bearing Restrictions Per Provider Order: No   Therapy/Group: Individual Therapy  Etoy Mcdonnell PTA 03/04/2024, 3:10 PM

## 2024-03-04 NOTE — Progress Notes (Signed)
 Speech Language Pathology Note  Patient Details  Name: Frank Fischer MRN: [id number] Date of Birth: 1960-07-18 Today's Date: 03/04/2024  Speech-Language Pathologist participated in the interdisciplinary team conference, providing clinical information regarding the patient's current status, treatment goals, and weekly focus, including any barriers that need to be addressed. Please see the Inpatient Rehabilitation Team Conference and Plan of Care Update for further details.    Frank Fischer 03/04/2024, 2:57 PM

## 2024-03-04 NOTE — Progress Notes (Signed)
 Occupational Therapy Session Note  Patient Details  Name: Frank Fischer MRN: [id number] Date of Birth: June 03, 1960  Today's Date: 03/04/2024 OT Individual Time: 9054-8944 OT Individual Time Calculation (min): 70 min    Short Term Goals: Week 1:  OT Short Term Goal 1 (Week 1): Pt will be able to hold stand balance with CGA to wash bottom. OT Short Term Goal 2 (Week 1): pt will be able to pull pants over hips with mod A. OT Short Term Goal 3 (Week 1): Pt will don shirt with min A . OT Short Term Goal 4 (Week 1): Pt will demonstrate improved R side visual awareness to scan to his R side with min cues vs max cues needed on evaluation .  Skilled Therapeutic Interventions/Progress Updates:    Pt received in bed and agreeable to a shower.  Pt continues to have a L gaze that does not cross midline,  and severe R neglect of his environment but he is improving with his ability to attend to his R limbs and today he spontaneously turned his head to the R more during R side stand pivots.  Pt makes strong attempts to use R hand and in the shower today he was able to use R hand 70% of the time to wash his L arm and R leg.  He continues to drop his washcloth often and may benefit from a wash mit.   See ADL documentation below, bathing he only needed A with feet but dressing requires mod A due to perceptual and sensory deficits.   Pt worked on standing balance with close S to light CGA and even used his R hand to pull up his pants!  Standing BP did not drop without TEDS on but placed them on pt to ensure he does not have a drop in BP.     Pt taken to gym to work on R side visual scanning by guiding his L hand to move from L to R to find objects place on the mirror in front of him with max cues and tactile guidance.   Standing balance with wt shifts to the L as he has a slight R lean.   Pt communicates with a few words at a time with partial clarity.   Discussed with pt the importance of working with  his speech therapists and putting in good effort with all therapies so he can go home soon.    Pt resting in w/c with alarm belt on and all needs met.   Therapy Documentation Precautions:  Precautions Precautions: Fall Precaution/Restrictions Comments: R hemi with inattention Restrictions Weight Bearing Restrictions Per Provider Order: No    Vital Signs: Therapy Vitals Temp: 97.8 F (36.6 C) Pulse Rate: 83 Resp: 19 BP: 119/73 Patient Position (if appropriate): Standing Oxygen Therapy SpO2: 98 % O2 Device: Room Air Pain: Pain Assessment Pain Scale: Faces Pain Score: 0-No pain Faces Pain Scale: No hurt ADL: ADL Eating: Maximal assistance Grooming: Maximal assistance Upper Body Bathing: Supervision/safety Where Assessed-Upper Body Bathing: Shower Lower Body Bathing: Minimal assistance Where Assessed-Lower Body Bathing: Shower Upper Body Dressing: Moderate assistance Lower Body Dressing: Moderate assistance Toileting: Maximal assistance Where Assessed-Toileting: Teacher, adult education: Curator Method: Surveyor, minerals: Grab bars, Raised toilet seat Tub/Shower Transfer: Not assessed Film/video editor: Insurance underwriter Method: Warden/ranger: Emergency planning/management officer, Grab bars   Therapy/Group: Individual Therapy  Kenzy Campoverde 03/04/2024, 11:59 AM

## 2024-03-04 NOTE — Progress Notes (Signed)
 PROGRESS NOTE   Subjective/Complaints:  Wants to go home.  We discussed cardiac issues including tachycardia and bradycardia as well as low blood pressures.  Patient remains aphasic, receptive and expressive language affected.  Continues to have severe right neglect  ROS: Limited due to cognitive/behavioral   Objective:   No results found.  Recent Labs    03/04/24 0621  WBC 4.7  HGB 14.7  HCT 43.3  PLT 293   Recent Labs    03/04/24 0621  NA 135  K 3.4*  CL 101  CO2 23  GLUCOSE 102*  BUN 13  CREATININE 1.11  CALCIUM  8.9    Intake/Output Summary (Last 24 hours) at 03/04/2024 0914 Last data filed at 03/03/2024 1847 Gross per 24 hour  Intake 350 ml  Output 200 ml  Net 150 ml         Physical Exam: Vital Signs Blood pressure 98/60, pulse (!) 57, temperature 98 F (36.7 C), temperature source Oral, resp. rate 10, height 5' 7 (1.702 m), weight 84 kg, SpO2 99%.    General: No acute distress Mood and affect are appropriate Heart: Regular rate and rhythm no rubs murmurs or extra sounds Lungs: Clear to auscultation, breathing unlabored, no rales or wheezes Abdomen: Positive bowel sounds, soft nontender to palpation, nondistended Extremities: No clubbing, cyanosis, or edema 3- RIght Delt bi tri grip, 3- RIght hip flex , 4- Knee ext Aphasia limits exam   Skin: No evidence of breakdown, no evidence of rash Neurologic: pt alert, mumbles, oriented to person, not place. Can follow simple commands. Limited insight and awareness.  CN cannot cooperate.  Reduced sensation on the right side to pinch. No abnl resting tone. No obvious limb ataxia.  Musc: No joint swelling in the upper or lower limbs   Assessment/Plan: 1. Functional deficits due to CVA now with AMS  Care Tool:  Bathing    Body parts bathed by patient: Chest, Abdomen, Right upper leg, Face, Right arm, Left upper leg   Body parts bathed by  helper: Left arm, Front perineal area, Buttocks, Right lower leg, Left lower leg     Bathing assist Assist Level: Moderate Assistance - Patient 50 - 74%     Upper Body Dressing/Undressing Upper body dressing   What is the patient wearing?: Pull over shirt    Upper body assist Assist Level: Moderate Assistance - Patient 50 - 74%    Lower Body Dressing/Undressing Lower body dressing      What is the patient wearing?: Incontinence brief, Pants     Lower body assist Assist for lower body dressing: Maximal Assistance - Patient 25 - 49%     Toileting Toileting    Toileting assist Assist for toileting: Maximal Assistance - Patient 25 - 49%     Transfers Chair/bed transfer  Transfers assist     Chair/bed transfer assist level: Minimal Assistance - Patient > 75%     Locomotion Ambulation   Ambulation assist      Assist level: 2 helpers Assistive device: No Device Max distance: 123ft   Walk 10 feet activity   Assist     Assist level: 2 helpers Assistive device: No Device  Walk 50 feet activity   Assist    Assist level: 2 helpers Assistive device: No Device    Walk 150 feet activity   Assist    Assist level: 2 helpers Assistive device: No Device    Walk 10 feet on uneven surface  activity   Assist Walk 10 feet on uneven surfaces activity did not occur: Safety/medical concerns (decreased balance, poor attention/awareness)         Wheelchair     Assist Is the patient using a wheelchair?: Yes Type of Wheelchair: Manual    Wheelchair assist level: Dependent - Patient 0% Max wheelchair distance: 147ft    Wheelchair 50 feet with 2 turns activity    Assist        Assist Level: Dependent - Patient 0%   Wheelchair 150 feet activity     Assist      Assist Level: Dependent - Patient 0%   Blood pressure 98/60, pulse (!) 57, temperature 98 F (36.7 C), temperature source Oral, resp. rate 10, height 5' 7 (1.702 m),  weight 84 kg, SpO2 99%.  Medical Problem List and Plan: 1. 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             -patient may shower  -10/28 discharge date, patient with poor awareness of deficits, if he were to leave the hospital it would be AMA 2.  Antithrombotics: -DVT/anticoagulation:  Mechanical: Sequential compression devices, below knee Bilateral lower extremities Pharmaceutical: Lovenox              -antiplatelet therapy: Aspirin  3. Pain Management: Tylenol  prn  4. Mood/Behavior/Sleep: LCSW to follow for evaluation and support when available.              -antipsychotic agents: Wellbutrin              - sleep chart 5. Neuropsych/cognition: This patient is not capable of making decisions on his own behalf.             -hope to see further improvement in arousal/cognition as Pb tapers off.  6. Skin/Wound Care: Routine pressure relief measures 7. Fluids/Electrolytes/Nutrition: Monitor intake and output.  Follow-up chemistries in AM.             - Ensure+ high protein +multivitamin+folic acid               -encourage adequate PO 8.  Hypertension: on home Amlodipine  10 mg and lisinopril  40 mg Vitals:   03/04/24 0414 03/04/24 0616  BP:    Pulse: 81 (!) 57  Resp:    Temp:    SpO2: 98% 99%   Controlled on lisinopril  10mg  and no amlodipine  9. BPH: Flomax  0.4 mg 10. HLD: LDL 111 goal < 70--Zetia  10 mg and atorvastatin  80 mg 11.  Poststroke dysphagia: SLP consult continue dysphagia 3 12.  Tobacco abuse: nicotine patch 7 mg  13.  Polysubstance abuse: Tox screen positive for cocaine and marijuana. 14.  Acute alcohol withdrawal: History of dependence drinking at least 6 beers daily. On 10/7 pt experienced restlessness/agitation and bradycardia concerning for ETOH withdrawal.              -continue Thiamine   -10/13 placed on phenobarbital taper which has been completed              15.   Hypokalemia:K+replaced with 40 mEq on 10/8. Now 3.5 and stable. Continue to monitor     Latest Ref Rng &  Units 03/04/2024    6:21 AM 03/01/2024    2:20 AM 02/29/2024    5:56 AM  BMP  Glucose 70 - 99 mg/dL 897  87  94   BUN 8 - 23 mg/dL 13  15  14    Creatinine 0.61 - 1.24 mg/dL 8.88  9.00  8.86   Sodium 135 - 145 mmol/L 135  138  144   Potassium 3.5 - 5.1 mmol/L 3.4  3.9  3.9   Chloride 98 - 111 mmol/L 101  105  107   CO2 22 - 32 mmol/L 23  18  21    Calcium  8.9 - 10.3 mg/dL 8.9  8.8  8.9     16. Syncopal, unresponsive episode 10/11. (BP 43/34 at the time) -neuro work up negative for acute infarct -monitor orthostatic vs -TEDS and abdominal binder to help maintain bp -encourage PO, monitor I/O's. Off IVF currently -labs in AM 17.  Bradycardia mainly at night, no obvious medications, will check EKG.  Last EKG on 02/23/2024 showed normal sinus rhythm rate 66 bpm  LOS: 3 days A FACE TO FACE EVALUATION WAS PERFORMED  This progress note was moved into the rehab chart for the purpose of maintaining appropriate chart documentation.  Prentice BRAVO Emett Stapel 03/04/2024, 9:14 AM

## 2024-03-05 MED ORDER — NICOTINE 14 MG/24HR TD PT24
14.0000 mg | MEDICATED_PATCH | Freq: Every day | TRANSDERMAL | Status: DC
  Administered 2024-03-06 – 2024-03-12 (×7): 14 mg via TRANSDERMAL
  Filled 2024-03-05 (×8): qty 1

## 2024-03-05 MED ORDER — LORAZEPAM 2 MG/ML IJ SOLN
1.0000 mg | Freq: Four times a day (QID) | INTRAMUSCULAR | Status: DC | PRN
  Administered 2024-03-11: 1 mg via INTRAMUSCULAR
  Filled 2024-03-05: qty 1

## 2024-03-05 MED ORDER — QUETIAPINE FUMARATE 25 MG PO TABS
25.0000 mg | ORAL_TABLET | Freq: Three times a day (TID) | ORAL | Status: DC
  Administered 2024-03-05 – 2024-03-06 (×4): 25 mg via ORAL
  Filled 2024-03-05 (×4): qty 1

## 2024-03-05 NOTE — Progress Notes (Incomplete)
 Is less paranoid this afternoon. Also, more redirectable this afternoon. Unknown if presence of family or being out of room. However, with

## 2024-03-05 NOTE — Progress Notes (Signed)
 Physical Therapy Session Note  Patient Details  Name: Frank Fischer MRN: [id number] Date of Birth: June 22, 1960  Today's Date: 03/05/2024 PT Individual Time: 8694-8652 PT Individual Time Calculation (min): 42 min  Today's Date: 03/05/2024 PT Missed Time: 45 Minutes Missed Time Reason: Patient unwilling to participate  Short Term Goals: Week 1:  PT Short Term Goal 1 (Week 1): pt will transfer bed<>chair with LRAD and CGA PT Short Term Goal 2 (Week 1): pt will transfer sit<>stand with LRAD and CGA PT Short Term Goal 3 (Week 1): pt will ambulate 43ft with LRAD and CGA  SESSION 1 Pt missed 45 min of skilled therapy due to pt refusal despite best efforts made to encourage pt to participate in therapy. Pt remained sidelying in bed with little to no communication and would shake head no regarding PT. Will re-attempt as schedule and pt availability permits.  SESSION 2 Skilled Therapeutic Interventions/Progress Updates: Patient semi-reclined in bed with brother present on entrance to room. Patient alert and agreeable to PT session.   Patient reported no pain. PTA discussed with pt and pt brother about pt's desire to go home despite education and recommendation for pt to stay at inpatient rehab to continue working on decreasing caregiver burden and increasing functional independence. Pt willing to participate in PT session. Pt encouraged to continue inpatient stay due to deficits at end of session, and to participate in SLP session after this treatment was over. Pt still adamant on not wanting to do therapy despite what was discussed.   Pt performed supine<sit on EOB with supervision (HOB elevated). Pt performed sit<>stand transfers without AD and with CGA for safety and VC to increase awareness to ensure safely sitting on surface (back of knees touching seat). Pt transported from room<>day room gym in [facility] dependently.   Neuromuscular Re-ed: NMR facilitated during session with focus on R sided  awareness, attention to task, weight shifting, dynamic/static standing balance. - pt ambulated in day room with modA and no AD and required max cuing to increase R sided awareness, as well as to shift weight to L due to R lean. Pt with decreased step clearance/length on R LE with max cuing for pt to increase but pt not initiating correction. Pt cued to sit back to [facility] that was on the R but ultimately required PTA to move WC to pt's line of sight for safety.  - Static standing balance with max multimodal cuing to shift weight to L due to posteriorly leaning to R with pt improving after practice but still required CGA (min/modA to prevent LOB at first, brief moment of CGA). Pt sat to rest. Pt stood and cued to move cones on R side of table to L side and required max multimodal cuing to locate R side of table. Pt would draw shapes on table vs picking up cone. PTA moved one cone over to L and cued pt to move to R but pt would only move it towards center of table. Pt sat in [facility] and performed same task due to increase in anterior lean to R that required mod/maxA to prevent LOB. Pt cued to pick up cone on L and to place in PTA hand on R. Pt unable to locate PTA hand and would place on R side of table instead.  NMR performed for improvements in motor control and coordination, balance, sequencing, judgement, and self confidence/ efficacy in performing all aspects of mobility at highest level of independence.   Patient sitting in WC at  end of session with brakes locked, brother and nsg present, belt alarm set, and all needs within reach.       Therapy Documentation Precautions:  Precautions Precautions: Fall Precaution/Restrictions Comments: R hemi with inattention Restrictions Weight Bearing Restrictions Per Provider Order: No  Therapy/Group: Individual Therapy  Larnell Granlund PTA 03/05/2024, 2:54 PM

## 2024-03-05 NOTE — Progress Notes (Signed)
 Speech Language Pathology Daily Session Note  Patient Details  Name: Frank Fischer MRN: [id number] Date of Birth: 1961/01/30  Today's Date: 03/05/2024 SLP Individual Time: 1400-1437 SLP Individual Time Calculation (min): 37 min and Today's Date: 03/05/2024 SLP Missed Time: 23 Minutes Missed Time Reason: Patient unwilling to participate  Short Term Goals: Week 1: SLP Short Term Goal 1 (Week 1): Pt will answer mildly complex yes/no questions via multimodal communication with 75% accuracy provided mod A cues SLP Short Term Goal 2 (Week 1): Patient will consume least restrictive diet with use of safe swallowing strategies given sup A SLP Short Term Goal 3 (Week 1): Pt will verbalize basic biographical/ personal information with 50% acc and max A SLP Short Term Goal 4 (Week 1): Pt will complete functional naming tasks w/ max A SLP Short Term Goal 5 (Week 1): Patient will attend to midline during functional tasks given max multimodal A  Skilled Therapeutic Interventions: SLP conducted skilled therapy session targeting communication goals. Patient greeted sitting upright in w/c with brother present during session. Patient minimally participatory during all tasks but tolerant of SLP presence. Patient required total assist for naming objects both on picture cards and in room, total assist for number sequence completion to elicit automatic speech, and dependent for writing name and wife's name. After 37 minutes of skilled time, patient refused any further participation and was transited back to bed with total cues for safety awareness and stand pivot sequencing. Patient was left in room with call bell in reach and alarm set. SLP will continue to target goals per plan of care.        Pain  None   Therapy/Group: Individual Therapy  Frank Fischer, M.A., CCC-SLP  Frank Fischer 03/05/2024, 2:39 PM

## 2024-03-05 NOTE — Progress Notes (Addendum)
 Patient requesting to leave. Refusing PRN and scheduled medication this AM. Offered PRN Seroquel for increase aggitation. Charge Nurse is aware and informed of situation. Provider aware and informed of situation. Nurse Teacher, English as a foreign language in and out sitting with patient while waiting for enclosure bed and tele-sitter. Reported patient attempting to strike one of the Nurse Techs this morning. Refusing to use the [facility] and aware of how to remove the [facility]. Working with OT to assist in changing patient and bringing him to the shower. Explained discharge would not be happening today. Explained process for discharge to patient and importance of completing goals set by Therapy/Provider. Refusing to change this morning continues to be in soiled clothes.

## 2024-03-05 NOTE — Progress Notes (Signed)
 Occupational Therapy Session Note  Patient Details  Name: Frank Fischer MRN: [id number] Date of Birth: Jun 25, 1960  Today's Date: 03/05/2024 OT Individual Time: 9084-9069 OT Individual Time Calculation (min): 15 min  and Today's Date: 03/05/2024 OT Missed Time: 45 Minutes Missed Time Reason: Patient unwilling/refused to participate without medical reason   Short Term Goals: Week 1:  OT Short Term Goal 1 (Week 1): Pt will be able to hold stand balance with CGA to wash bottom. OT Short Term Goal 2 (Week 1): pt will be able to pull pants over hips with mod A. OT Short Term Goal 3 (Week 1): Pt will don shirt with min A . OT Short Term Goal 4 (Week 1): Pt will demonstrate improved R side visual awareness to scan to his R side with min cues vs max cues needed on evaluation .  Skilled Therapeutic Interventions/Progress Updates:    Nursing staff informed the team that yesterday afternoon, pt got out of bed and ambulated from room down the hallway by himself and needed max redirection to his room.  Pt's room was moved to the locked hallway to avoid elopement.   This am as I passed his room, pt sitting EOB eating cold pizza for breakfast.   Upon entering his room for therapy, NT and RN present as pt was laying at the end of the bed with legs hung over foot board and wet brief on. They stated he was refusing to be touched or assisted to be changed.  I tried to interact with pt as he has had several good sessions with me and he participated well yesterday completing a shower and dressing.  Pt would not respond to me.  Tried to gently touch his arm and he pulled away.  Reminded him of his progress and offered to take him outside for fresh air IF he showered and dressed. Pt refused.  Called his SO, Frank Fischer, and asked her to talk to him.  She was on the phone with him and keep kept saying leave me alone.   Pt eventually scooted himself up in the bed, but turned on his side and would not engage with the  staff.   Bed alarm on.    Therapy Documentation Precautions:  Precautions Precautions: Fall Precaution/Restrictions Comments: R hemi with inattention Restrictions Weight Bearing Restrictions Per Provider Order: No Vital Signs: Therapy Vitals Temp: 98.9 F (37.2 C) Pulse Rate: 74 Resp: 18 BP: 110/65 Patient Position (if appropriate): Lying Oxygen Therapy SpO2: 96 % O2 Device: Room Air Pain:  No response ADL: ADL Eating: Maximal assistance Grooming: Maximal assistance Upper Body Bathing: Supervision/safety Where Assessed-Upper Body Bathing: Shower Lower Body Bathing: Minimal assistance Where Assessed-Lower Body Bathing: Shower Upper Body Dressing: Moderate assistance Lower Body Dressing: Moderate assistance Toileting: Maximal assistance Where Assessed-Toileting: Teacher, adult education: Curator Method: Surveyor, minerals: Grab bars, Raised toilet seat Tub/Shower Transfer: Not assessed Film/video editor: Insurance underwriter Method: Warden/ranger: Emergency planning/management officer, Grab bars   Therapy/Group: Individual Therapy  Dion Sibal 03/05/2024, 8:43 AM

## 2024-03-05 NOTE — Progress Notes (Addendum)
 PROGRESS NOTE   Subjective/Complaints:  Wants to go home. Patient remains aphasic, receptive and expressive language affected.  Continues to have severe right neglect Inconsistent participation in therapy Tried to wander , now in 4W , locked unit  ROS: Limited due to cognitive/behavioral   Objective:   No results found.  Recent Labs    03/04/24 0621  WBC 4.7  HGB 14.7  HCT 43.3  PLT 293   Recent Labs    03/04/24 0621  NA 135  K 3.4*  CL 101  CO2 23  GLUCOSE 102*  BUN 13  CREATININE 1.11  CALCIUM  8.9    Intake/Output Summary (Last 24 hours) at 03/05/2024 0913 Last data filed at 03/04/2024 1857 Gross per 24 hour  Intake 100 ml  Output --  Net 100 ml         Physical Exam: Vital Signs Blood pressure 110/65, pulse 74, temperature 98.9 F (37.2 C), resp. rate 18, height 5' 7 (1.702 m), weight 84 kg, SpO2 96%.    General: No acute distress Mood and affect are appropriate Heart: Regular rate and rhythm no rubs murmurs or extra sounds Lungs: Clear to auscultation, breathing unlabored, no rales or wheezes Abdomen: Positive bowel sounds, soft nontender to palpation, nondistended Extremities: No clubbing, cyanosis, or edema 3- RIght Delt bi tri grip, 3- RIght hip flex , 4- Knee ext Aphasia limits exam   Skin: No evidence of breakdown, no evidence of rash Neurologic: pt alert, mumbles, oriented to person, not place. Can follow simple commands. Limited insight and awareness.  CN cannot cooperate.  Reduced sensation on the right side to pinch. No abnl resting tone. No obvious limb ataxia.  Musc: No joint swelling in the upper or lower limbs   Assessment/Plan: 1. Functional deficits due to CVA now with AMS  Care Tool:  Bathing    Body parts bathed by patient: Chest, Abdomen, Right upper leg, Face, Right arm, Left upper leg, Left arm, Front perineal area, Buttocks   Body parts bathed by helper: Right  lower leg, Left lower leg     Bathing assist Assist Level: Minimal Assistance - Patient > 75%     Upper Body Dressing/Undressing Upper body dressing   What is the patient wearing?: Pull over shirt    Upper body assist Assist Level: Moderate Assistance - Patient 50 - 74%    Lower Body Dressing/Undressing Lower body dressing      What is the patient wearing?: Incontinence brief, Pants     Lower body assist Assist for lower body dressing: Moderate Assistance - Patient 50 - 74%     Toileting Toileting    Toileting assist Assist for toileting: Maximal Assistance - Patient 25 - 49%     Transfers Chair/bed transfer  Transfers assist     Chair/bed transfer assist level: Contact Guard/Touching assist     Locomotion Ambulation   Ambulation assist      Assist level: 2 helpers Assistive device: No Device Max distance: 137ft   Walk 10 feet activity   Assist     Assist level: 2 helpers Assistive device: No Device   Walk 50 feet activity   Assist  Assist level: 2 helpers Assistive device: No Device    Walk 150 feet activity   Assist    Assist level: 2 helpers Assistive device: No Device    Walk 10 feet on uneven surface  activity   Assist Walk 10 feet on uneven surfaces activity did not occur: Safety/medical concerns (decreased balance, poor attention/awareness)         Wheelchair     Assist Is the patient using a wheelchair?: Yes Type of Wheelchair: Manual    Wheelchair assist level: Dependent - Patient 0% Max wheelchair distance: 154ft    Wheelchair 50 feet with 2 turns activity    Assist        Assist Level: Dependent - Patient 0%   Wheelchair 150 feet activity     Assist      Assist Level: Dependent - Patient 0%   Blood pressure 110/65, pulse 74, temperature 98.9 F (37.2 C), resp. rate 18, height 5' 7 (1.702 m), weight 84 kg, SpO2 96%.  Medical Problem List and Plan: 1. 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             -patient may shower  -10/28 discharge date, patient with poor awareness of deficits, if he were to leave the hospital it would be AMA Rec enclosure bed for safety, poor balance and no RIght sided awareness 2.  Antithrombotics: -DVT/anticoagulation:  Mechanical: Sequential compression devices, below knee Bilateral lower extremities Pharmaceutical: Lovenox              -antiplatelet therapy: Aspirin  3. Pain Management: Tylenol  prn  4. Mood/Behavior/Sleep: LCSW to follow for evaluation and support when available.              -antipsychotic agents: seroquel scheduled TID             - sleep chart Ativan  IM or po 1mg  q6h prn 5. Neuropsych/cognition: This patient is not capable of making decisions on his own behalf.             -hope to see further improvement in arousal/cognition as Pb tapers off.  6. Skin/Wound Care: Routine pressure relief measures 7. Fluids/Electrolytes/Nutrition: Monitor intake and output.  Follow-up chemistries in AM.             - Ensure+ high protein +multivitamin+folic acid               -encourage adequate PO 8.  Hypertension: on home Amlodipine  10 mg and lisinopril  40 mg Vitals:   03/04/24 2030 03/05/24 0452  BP: 96/68 110/65  Pulse: (!) 51 74  Resp: 16 18  Temp: 98.5 F (36.9 C) 98.9 F (37.2 C)  SpO2: 93% 96%   Controlled on lisinopril  10mg  and no amlodipine  9. BPH: Flomax  0.4 mg 10. HLD: LDL 111 goal < 70--Zetia  10 mg and atorvastatin  80 mg 11.  Poststroke dysphagia: SLP consult continue dysphagia 3 12.  Tobacco abuse: nicotine patch 7 mg  13.  Polysubstance abuse: Tox screen positive for cocaine and marijuana. 14.  Acute alcohol withdrawal: History of dependence drinking at least 6 beers daily. On 10/7 pt experienced restlessness/agitation and bradycardia concerning for ETOH withdrawal.              -continue Thiamine   -10/13 placed on  phenobarbital taper which has been completed              15.  Hypokalemia:K+replaced with 40 mEq on 10/8. Now  3.5 and stable. Continue to monitor     Latest Ref Rng & Units 03/04/2024    6:21 AM 03/01/2024    2:20 AM 02/29/2024    5:56 AM  BMP  Glucose 70 - 99 mg/dL 897  87  94   BUN 8 - 23 mg/dL 13  15  14    Creatinine 0.61 - 1.24 mg/dL 8.88  9.00  8.86   Sodium 135 - 145 mmol/L 135  138  144   Potassium 3.5 - 5.1 mmol/L 3.4  3.9  3.9   Chloride 98 - 111 mmol/L 101  105  107   CO2 22 - 32 mmol/L 23  18  21    Calcium  8.9 - 10.3 mg/dL 8.9  8.8  8.9    Give KCL 40meq x 1 16. Syncopal, unresponsive episode 10/11. (BP 43/34 at the time) -neuro work up negative for acute infarct -monitor orthostatic vs -TEDS and abdominal binder to help maintain bp -encourage PO, monitor I/O's. Off IVF currently -labs in AM 17.  Bradycardia mainly at night, no obvious medications, will check EKG.  Last EKG on 02/23/2024 showed normal sinus rhythm rate 66 bpm 10/16 /2025 Sinus rhythm rate 52 LOS: 4 days A FACE TO FACE EVALUATION WAS PERFORMED    Prentice FORBES Compton 03/05/2024, 9:13 AM

## 2024-03-06 MED ORDER — QUETIAPINE FUMARATE 50 MG PO TABS
50.0000 mg | ORAL_TABLET | Freq: Three times a day (TID) | ORAL | Status: DC
Start: 2024-03-06 — End: 2024-03-08
  Administered 2024-03-06 – 2024-03-08 (×5): 50 mg via ORAL
  Filled 2024-03-06 (×5): qty 1

## 2024-03-06 NOTE — Progress Notes (Signed)
 Physical Therapy Session Note  Patient Details  Name: Frank Fischer MRN: [id number] Date of Birth: 08-04-1960  Today's Date: 03/06/2024 PT Individual Time: 0808-0853 PT Individual Time Calculation (min): 45 min  Today's Date: 03/06/2024 PT Missed Time: 30 Minutes Missed Time Reason: Patient unwilling to participate Today's Date: 03/06/2024 PT Missed Time: 45 Minutes Missed Time Reason: Patient unwilling to participate  Short Term Goals: Week 1:  PT Short Term Goal 1 (Week 1): pt will transfer bed<>chair with LRAD and CGA PT Short Term Goal 2 (Week 1): pt will transfer sit<>stand with LRAD and CGA PT Short Term Goal 3 (Week 1): pt will ambulate 63ft with LRAD and CGA  SESSION 1 Skilled Therapeutic Interventions/Progress Updates: Patient supine in enclosure bed on entrance to room. Patient alert and agreeable to PT session.   Patient reported no pain during session. Pt did not want to participate in therapy towards end of session despite encouragement to continue progressing.   Therapeutic Activity: Bed Mobility: Pt performed supine<sit on EOB with supervision.  Transfers: Pt performed sit<>stand transfers throughout session with no AD and with CGA. Provided VC for safety awareness and controlled descent.  - Pt required threading assist to donn personal pants but able to pull up to waist once standing. Pt maxA to donn personal shoes for time management.   Neuromuscular Re-ed: NMR facilitated during session with focus on weight shift, dynamic standing balance, coordination, attention to R. - Pt ambulated around main gym with minA to maintain standing balance, but maxA with safety and awareness due to R side neglect and pt seemingly not following commands to increase step clearance on R LE. Pt sat to perform next intervention - Pt stepped to 3.8 step with 5lb ankle weight donned R LE. Pt required modA to prevent LOB to R per R lean. Pt required max multimodal cuing 1st round to shift  weight to L in order to safely advance R LE. Pt started to initiate correction the more space PTA gave pt to fail. Pt required seated rest break. Pt began to improve weight shift to L but unable to safely advance R LE onto step without min/modA to maintain weight shift to L for a long enough time. Pt required seated rest break. Pt performed 3rd round and improved time in stance on L LE with mostly CGA/light minA for safety and pt reporting he felt steadier this round. 4th round performed with pt continuing to improve and only required light CGA/close supervision with improved step clearance onto step (less off front of shoe hitting edge of step).  - 2nd gait trial with 5lb ankle weight donned R LE with slight improvement in step length but pt still requiring max cuing to improve clearance. minA throughout and pt with improvement in R lean vs previous day with this PTA.  NMR performed for improvements in motor control and coordination, balance, sequencing, judgement, and self confidence/ efficacy in performing all aspects of mobility at highest level of independence.   Patient sitting in WC at end of session with brakes locked, belt alarm set, and all needs within reach.  SESSION 2 Pt missed 45 min of skilled therapy due to pt refusal despite best efforts to encourage pt to participate in therapy. Will re-attempt as schedule and pt availability permits.       Therapy Documentation Precautions:  Precautions Precautions: Fall Precaution/Restrictions Comments: R hemi with inattention Restrictions Weight Bearing Restrictions Per Provider Order: No  Therapy/Group: Individual Therapy  Delissa Silba PTA 03/06/2024,  9:09 AM

## 2024-03-06 NOTE — Progress Notes (Signed)
 PROGRESS NOTE   Subjective/Complaints:  Pt intermittently agitated and irritable. Physical at times. Needs enclosure bed for safety  ROS: Limited due to cognitive/behavioral   Objective:   No results found.  Recent Labs    03/04/24 0621  WBC 4.7  HGB 14.7  HCT 43.3  PLT 293   Recent Labs    03/04/24 0621  NA 135  K 3.4*  CL 101  CO2 23  GLUCOSE 102*  BUN 13  CREATININE 1.11  CALCIUM  8.9    Intake/Output Summary (Last 24 hours) at 03/06/2024 1652 Last data filed at 03/06/2024 0730 Gross per 24 hour  Intake 200 ml  Output 200 ml  Net 0 ml         Physical Exam: Vital Signs Blood pressure 111/88, pulse 60, temperature 98.1 F (36.7 C), temperature source Oral, resp. rate 18, height 5' 7 (1.702 m), weight 84 kg, SpO2 (!) 64%.    Constitutional: No distress . Vital signs reviewed. HEENT: NCAT, EOMI, oral membranes moist Neck: supple Cardiovascular: RRR without murmur. No JVD    Respiratory/Chest: CTA Bilaterally without wheezes or rales. Normal effort    GI/Abdomen: BS +, non-tender, non-distended Ext: no clubbing, cyanosis, or edema Psych: irritable and uncooperative 3- RIght Delt bi tri grip, 3- RIght hip flex , 4- Knee ext Aphasia limits exam   Skin: No evidence of breakdown, no evidence of rash Neurologic: pt alert, mumbles, oriented to person, not place. Can follow simple commands. Limited insight and awareness.  CN cannot cooperate.  Reduced sensation on the right side to pinch. No abnl resting tone. No obvious limb ataxia.  Musc: No joint swelling in the upper or lower limbs   Assessment/Plan: 1. Functional deficits due to CVA now with AMS  Care Tool:  Bathing    Body parts bathed by patient: Chest, Abdomen, Right upper leg, Face, Right arm, Left upper leg, Left arm, Front perineal area, Buttocks   Body parts bathed by helper: Right lower leg, Left lower leg     Bathing assist  Assist Level: Minimal Assistance - Patient > 75%     Upper Body Dressing/Undressing Upper body dressing   What is the patient wearing?: Pull over shirt    Upper body assist Assist Level: Moderate Assistance - Patient 50 - 74%    Lower Body Dressing/Undressing Lower body dressing      What is the patient wearing?: Incontinence brief, Pants     Lower body assist Assist for lower body dressing: Moderate Assistance - Patient 50 - 74%     Toileting Toileting    Toileting assist Assist for toileting: Maximal Assistance - Patient 25 - 49%     Transfers Chair/bed transfer  Transfers assist     Chair/bed transfer assist level: Contact Guard/Touching assist     Locomotion Ambulation   Ambulation assist      Assist level: 2 helpers Assistive device: No Device Max distance: 150ft   Walk 10 feet activity   Assist     Assist level: 2 helpers Assistive device: No Device   Walk 50 feet activity   Assist    Assist level: 2 helpers Assistive device: No Device  Walk 150 feet activity   Assist    Assist level: 2 helpers Assistive device: No Device    Walk 10 feet on uneven surface  activity   Assist Walk 10 feet on uneven surfaces activity did not occur: Safety/medical concerns (decreased balance, poor attention/awareness)         Wheelchair     Assist Is the patient using a wheelchair?: Yes Type of Wheelchair: Manual    Wheelchair assist level: Dependent - Patient 0% Max wheelchair distance: 176ft    Wheelchair 50 feet with 2 turns activity    Assist        Assist Level: Dependent - Patient 0%   Wheelchair 150 feet activity     Assist      Assist Level: Dependent - Patient 0%   Blood pressure 111/88, pulse 60, temperature 98.1 F (36.7 C), temperature source Oral, resp. rate 18, height 5' 7 (1.702 m), weight 84 kg, SpO2 (!) 64%.  Medical Problem List and Plan: 1. Functional deficits secondary to acute ischemic  CVA left MCA-s/p mechanical thrombectomy and TNK left carotid terminus and M1 occlusion.             -patient may shower  -10/28 discharge date, patient with poor awareness of deficits, if he were to leave the hospital it would be AMA Rec enclosure bed for safety, poor balance and no RIght sided awareness 2.  Antithrombotics: -DVT/anticoagulation:  Mechanical: Sequential compression devices, below knee Bilateral lower extremities Pharmaceutical: Lovenox              -antiplatelet therapy: Aspirin  3. Pain Management: Tylenol  prn  4. Mood/Behavior/Sleep: LCSW to follow for evaluation and support when available.              -antipsychotic agents: seroquel scheduled TID             - sleep chart Ativan  IM or po 1mg  q6h prn  10/18 remains irritable and agitated. Will increase seroquel to 50mg  TID and observe behavior. Unsure how much this will help given his ETOH hx 5. Neuropsych/cognition: This patient is not capable of making decisions on his own behalf.             -hope to see further improvement in arousal/cognition as Pb tapers off.  6. Skin/Wound Care: Routine pressure relief measures 7. Fluids/Electrolytes/Nutrition: Monitor intake and output.  Follow-up chemistries in AM.             - Ensure+ high protein +multivitamin+folic acid               -encourage adequate PO 8.  Hypertension: on home Amlodipine  10 mg and lisinopril  40 mg Vitals:   03/06/24 0835 03/06/24 1347  BP: 128/79 111/88  Pulse:    Resp:  18  Temp:  98.1 F (36.7 C)  SpO2:  (!) 64%   Controlled on lisinopril  10mg  and no amlodipine  9. BPH: Flomax  0.4 mg 10. HLD: LDL 111 goal < 70--Zetia  10 mg and atorvastatin  80 mg 11.  Poststroke dysphagia: SLP consult continue dysphagia 3 12.  Tobacco abuse: nicotine patch 7 mg  13.  Polysubstance abuse: Tox screen positive for cocaine and marijuana. 14.  Acute alcohol withdrawal: History of dependence drinking at least 6 beers daily. On 10/7 pt experienced  restlessness/agitation and bradycardia concerning for ETOH withdrawal.              -continue Thiamine   -10/13 placed on phenobarbital taper which has been completed  15.  Hypokalemia:K+replaced with 40 mEq on 10/8. Now 3.5 and stable. Continue to monitor     Latest Ref Rng & Units 03/04/2024    6:21 AM 03/01/2024    2:20 AM 02/29/2024    5:56 AM  BMP  Glucose 70 - 99 mg/dL 897  87  94   BUN 8 - 23 mg/dL 13  15  14    Creatinine 0.61 - 1.24 mg/dL 8.88  9.00  8.86   Sodium 135 - 145 mmol/L 135  138  144   Potassium 3.5 - 5.1 mmol/L 3.4  3.9  3.9   Chloride 98 - 111 mmol/L 101  105  107   CO2 22 - 32 mmol/L 23  18  21    Calcium  8.9 - 10.3 mg/dL 8.9  8.8  8.9    Give KCL 40meq x 1 16. Syncopal, unresponsive episode 10/11. (BP 43/34 at the time) -neuro work up negative for acute infarct -monitor orthostatic vs -TEDS and abdominal binder to help maintain bp -encourage PO, monitor I/O's. Off IVF currently -labs in AM 17.  Bradycardia mainly at night, no obvious medications, will check EKG.  Last EKG on 02/23/2024 showed normal sinus rhythm rate 66 bpm 10/16 /2025 Sinus rhythm rate 52 LOS: 5 days A FACE TO FACE EVALUATION WAS PERFORMED    Frank Fischer 03/06/2024, 4:52 PM

## 2024-03-06 NOTE — Progress Notes (Signed)
 Occupational Therapy Session Note  Patient Details  Name: Frank Fischer MRN: [id number] Date of Birth: Feb 11, 1961  Today's Date: 03/06/2024 OT Individual Time: 8597-8557 OT Individual Time Calculation (min): 40 min    Short Term Goals: Week 1:  OT Short Term Goal 1 (Week 1): Pt will be able to hold stand balance with CGA to wash bottom. OT Short Term Goal 2 (Week 1): pt will be able to pull pants over hips with mod A. OT Short Term Goal 3 (Week 1): Pt will don shirt with min A . OT Short Term Goal 4 (Week 1): Pt will demonstrate improved R side visual awareness to scan to his R side with min cues vs max cues needed on evaluation .  Skilled Therapeutic Interventions/Progress Updates: Patient seen for functional activity tolerance. Assisted OOB- patient motivated to get out of the room. Assited SBA/CGA from bed to w/c. Propelled to gym for funtional reaching tasks and visual scanning activity. Worked at the table top for reaching across midline. Patient needed constant cuing to use the RUE and look towards the right. Patient with fair strength and fair targeting with RUE reach, but was quick to discontinue each exercise and had difficulty with sustained attention. Continued with bean bag toss at midline working with Left hand. Patient was unable to reach the target 10 in front of w/c at midline. Therapist demo's worked on patient following therapist voice to look at beanbag board placed in midline, but patient only able to pick up beanbag from basket at left and drop on floor to the left of his w/c. Continued treatment with table top task placed left of midline hoping for improved attention and success with fine motor activity, but patient would only pick up items and place back on table and not to their matching location. Patient with continued decline in participation, but he also did not want to go back into his enclosure bed. Patient educated on the reason he had the bed and given the option to  participate with OT activities or get back in bed. Patient chose to get back in bed. Continue to work on patient's right neglect and participation to work towards LTG's      Therapy Documentation Precautions:  Precautions Precautions: Fall Precaution/Restrictions Comments: R hemi with inattention Restrictions Weight Bearing Restrictions Per Provider Order: No General: General OT Amount of Missed Time: 50 Minutes PT Missed Treatment Reason: Patient unwilling to participate Vital Signs: Therapy Vitals Temp: 98.1 F (36.7 C) Temp Source: Oral Resp: 18 BP: 111/88 Patient Position (if appropriate): Lying Oxygen Therapy SpO2: (!) 64 % O2 Device: Room Air Pain:   ADL: ADL Eating: Maximal assistance Grooming: Maximal assistance Upper Body Bathing: Supervision/safety Where Assessed-Upper Body Bathing: Shower Lower Body Bathing: Minimal assistance Where Assessed-Lower Body Bathing: Shower Upper Body Dressing: Moderate assistance Lower Body Dressing: Moderate assistance Toileting: Maximal assistance Where Assessed-Toileting: Teacher, adult education: Curator Method: Surveyor, minerals: Grab bars, Raised toilet seat Tub/Shower Transfer: Not assessed Film/video editor: Insurance underwriter Method: Warden/ranger: Emergency planning/management officer, Grab bars    Therapy/Group: Individual Therapy  Isaiah JONETTA Freund 03/06/2024, 3:44 PM

## 2024-03-06 NOTE — Progress Notes (Signed)
 Assisted patient to the bathroom this afternoon. Repositioned in the enclosure bed. Increased frustration and irritability this afternoon. Attempting to hit staff twice this afternoon. Frustrated with staff assisting him with walking. However, while patient pushing staff away walking into the shower and wall. No further needs at this time. Took afternoon medication. Unable to obtain pulse on patient.

## 2024-03-07 NOTE — Plan of Care (Signed)
  Problem: Consults Goal: RH STROKE PATIENT EDUCATION Description: See Patient Education module for education specifics  Outcome: Progressing   Problem: RH BOWEL ELIMINATION Goal: RH STG MANAGE BOWEL WITH ASSISTANCE Description: STG Manage Bowel with mod I Assistance. Outcome: Progressing Goal: RH STG MANAGE BOWEL W/MEDICATION W/ASSISTANCE Description: STG Manage Bowel with Medication with mod I Assistance. Outcome: Progressing   Problem: RH BLADDER ELIMINATION Goal: RH STG MANAGE BLADDER WITH ASSISTANCE Description: STG Manage Bladder With mod I Assistance Outcome: Progressing Goal: RH STG MANAGE BLADDER WITH MEDICATION WITH ASSISTANCE Description: STG Manage Bladder With Medication With mod I Assistance. Outcome: Progressing   Problem: RH SAFETY Goal: RH STG ADHERE TO SAFETY PRECAUTIONS W/ASSISTANCE/DEVICE Description: STG Adhere to Safety Precautions With cues Assistance/Device. Outcome: Progressing   Problem: RH KNOWLEDGE DEFICIT Goal: RH STG INCREASE KNOWLEDGE OF HYPERTENSION Description: Patient and spouse will be able to manage HTN using educational resources for medications and dietary modification independently Outcome: Progressing Goal: RH STG INCREASE KNOWLEDGE OF DYSPHAGIA/FLUID INTAKE Description: Patient and spouse will be able to manage dysphagia using educational resources for medications and dietary modification independently Outcome: Progressing Goal: RH STG INCREASE KNOWLEGDE OF HYPERLIPIDEMIA Description: Patient and spouse will be able to manage HLD using educational resources for medications and dietary modification independently Outcome: Progressing Goal: RH STG INCREASE KNOWLEDGE OF STROKE PROPHYLAXIS Description: Patient and spouse will be able to manage secondary risk using educational resources for medications and dietary modification independently Outcome: Progressing   Problem: RH Vision Goal: RH LTG Vision (Specify) Outcome: Progressing    Problem: Safety: Goal: Non-violent Restraint(s) Outcome: Progressing

## 2024-03-07 NOTE — Progress Notes (Signed)
 PROGRESS NOTE   Subjective/Complaints:  Agitation still an issue but a bit more subdued over last 24 hours.   ROS: Limited due to cognitive/behavioral/aphasia   Objective:   No results found.  No results for input(s): WBC, HGB, HCT, PLT in the last 72 hours.  No results for input(s): NA, K, CL, CO2, GLUCOSE, BUN, CREATININE, CALCIUM  in the last 72 hours.   Intake/Output Summary (Last 24 hours) at 03/07/2024 1038 Last data filed at 03/06/2024 2300 Gross per 24 hour  Intake 716 ml  Output --  Net 716 ml         Physical Exam: Vital Signs Blood pressure 117/71, pulse (!) 58, temperature 98.7 F (37.1 C), temperature source Oral, resp. rate 17, height 5' 7 (1.702 m), weight 84 kg, SpO2 99%.    Constitutional: No distress . Vital signs reviewed. HEENT: NCAT, EOMI, oral membranes moist Neck: supple Cardiovascular: RRR without murmur. No JVD    Respiratory/Chest: CTA Bilaterally without wheezes or rales. Normal effort    GI/Abdomen: BS +, non-tender, non-distended Ext: no clubbing, cyanosis, or edema Psych: still irritable, cooperates somewhat during visit  3- RIght Delt bi tri grip, 3- RIght hip flex , 4- Knee ext Aphasia limits exam   Skin: No evidence of breakdown, no evidence of rash Neurologic: pt alert, mumbles, oriented to person, not place. Can follow simple commands. Limited insight and awareness.  CN cannot cooperate.  Reduced sensation on the right side to pinch. No abnl resting tone. No obvious limb ataxia.  Musc: No joint swelling in the upper or lower limbs   Assessment/Plan: 1. Functional deficits due to CVA now with AMS  Care Tool:  Bathing    Body parts bathed by patient: Chest, Abdomen, Right upper leg, Face, Right arm, Left upper leg, Left arm, Front perineal area, Buttocks   Body parts bathed by helper: Right lower leg, Left lower leg     Bathing assist Assist  Level: Minimal Assistance - Patient > 75%     Upper Body Dressing/Undressing Upper body dressing   What is the patient wearing?: Pull over shirt    Upper body assist Assist Level: Moderate Assistance - Patient 50 - 74%    Lower Body Dressing/Undressing Lower body dressing      What is the patient wearing?: Incontinence brief, Pants     Lower body assist Assist for lower body dressing: Moderate Assistance - Patient 50 - 74%     Toileting Toileting    Toileting assist Assist for toileting: Maximal Assistance - Patient 25 - 49%     Transfers Chair/bed transfer  Transfers assist     Chair/bed transfer assist level: Contact Guard/Touching assist     Locomotion Ambulation   Ambulation assist      Assist level: 2 helpers Assistive device: No Device Max distance: 126ft   Walk 10 feet activity   Assist     Assist level: 2 helpers Assistive device: No Device   Walk 50 feet activity   Assist    Assist level: 2 helpers Assistive device: No Device    Walk 150 feet activity   Assist    Assist level: 2 helpers Assistive device:  No Device    Walk 10 feet on uneven surface  activity   Assist Walk 10 feet on uneven surfaces activity did not occur: Safety/medical concerns (decreased balance, poor attention/awareness)         Wheelchair     Assist Is the patient using a wheelchair?: Yes Type of Wheelchair: Manual    Wheelchair assist level: Dependent - Patient 0% Max wheelchair distance: 143ft    Wheelchair 50 feet with 2 turns activity    Assist        Assist Level: Dependent - Patient 0%   Wheelchair 150 feet activity     Assist      Assist Level: Dependent - Patient 0%   Blood pressure 117/71, pulse (!) 58, temperature 98.7 F (37.1 C), temperature source Oral, resp. rate 17, height 5' 7 (1.702 m), weight 84 kg, SpO2 99%.  Medical Problem List and Plan: 1. 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             -patient may shower  -10/28 discharge date, patient with poor awareness of deficits, if he were to leave the hospital it would be AMA   -continue enclosure bed for safety, poor balance and no RIght sided awareness 2.  Antithrombotics: -DVT/anticoagulation:  Mechanical: Sequential compression devices, below knee Bilateral lower extremities Pharmaceutical: Lovenox              -antiplatelet therapy: Aspirin  3. Pain Management: Tylenol  prn  4. Mood/Behavior/Sleep: LCSW to follow for evaluation and support when available.              -antipsychotic agents: seroquel scheduled TID             - sleep chart Ativan  IM or po 1mg  q6h prn  10/18 remains irritable and agitated. Will increase seroquel to 50mg  TID and observe behavior. Unsure how much this will help given his ETOH hx  10/19 perhaps a little improvement with 50mg  tid--observe for behavior/tolerance today 5. Neuropsych/cognition: This patient is not capable of making decisions on his own behalf.             -hope to see further improvement in arousal/cognition as Pb tapers off.  6. Skin/Wound Care: Routine pressure relief measures 7. Fluids/Electrolytes/Nutrition: Monitor intake and output.  Follow-up chemistries in AM.             - Ensure+ high protein +multivitamin+folic acid               -encourage adequate PO 8.  Hypertension: on home Amlodipine  10 mg and lisinopril  40 mg Vitals:   03/06/24 1923 03/07/24 0509  BP: 122/67 117/71  Pulse: (!) 58   Resp: 18 17  Temp: 98.8 F (37.1 C) 98.7 F (37.1 C)  SpO2: 97% 99%   Controlled on lisinopril  10mg  and no amlodipine  10/19 9. BPH: Flomax  0.4 mg 10. HLD: LDL 111 goal < 70--Zetia  10 mg and atorvastatin  80 mg 11.  Poststroke dysphagia: SLP consult continue dysphagia 3 12.  Tobacco abuse: nicotine patch 7 mg  13.  Polysubstance abuse: Tox screen positive for cocaine and marijuana. 14.   Acute alcohol withdrawal: History of dependence drinking at least 6 beers daily. On 10/7 pt experienced restlessness/agitation and bradycardia concerning for ETOH withdrawal.              -continue Thiamine   -10/13 placed on phenobarbital taper which has been completed  15.  Hypokalemia:K+replaced with 40 mEq on 10/8. Now 3.5 and stable. Continue to monitor     Latest Ref Rng & Units 03/04/2024    6:21 AM 03/01/2024    2:20 AM 02/29/2024    5:56 AM  BMP  Glucose 70 - 99 mg/dL 897  87  94   BUN 8 - 23 mg/dL 13  15  14    Creatinine 0.61 - 1.24 mg/dL 8.88  9.00  8.86   Sodium 135 - 145 mmol/L 135  138  144   Potassium 3.5 - 5.1 mmol/L 3.4  3.9  3.9   Chloride 98 - 111 mmol/L 101  105  107   CO2 22 - 32 mmol/L 23  18  21    Calcium  8.9 - 10.3 mg/dL 8.9  8.8  8.9    Give KCL 40meq x 1 16. Syncopal, unresponsive episode 10/11. (BP 43/34 at the time) -neuro work up negative for acute infarct -monitor orthostatic vs -TEDS and abdominal binder to help maintain bp -encourage PO, monitor I/O's. Off IVF currently 10/19 labs Monday  17.  Bradycardia mainly at night, no obvious medications, will check EKG.  Last EKG on 02/23/2024 showed normal sinus rhythm rate 66 bpm 10/16-19 /2025 Sinus rhythm rate in 50's   LOS: 6 days A FACE TO FACE EVALUATION WAS PERFORMED    Frank Fischer 03/07/2024, 10:38 AM

## 2024-03-08 DIAGNOSIS — I639 Cerebral infarction, unspecified: Secondary | ICD-10-CM

## 2024-03-08 DIAGNOSIS — F09 Unspecified mental disorder due to known physiological condition: Secondary | ICD-10-CM

## 2024-03-08 LAB — CBC
HCT: 44.2 % (ref 39.0–52.0)
Hemoglobin: 14.9 g/dL (ref 13.0–17.0)
MCH: 31.6 pg (ref 26.0–34.0)
MCHC: 33.7 g/dL (ref 30.0–36.0)
MCV: 93.8 fL (ref 80.0–100.0)
Platelets: 266 K/uL (ref 150–400)
RBC: 4.71 MIL/uL (ref 4.22–5.81)
RDW: 13.2 % (ref 11.5–15.5)
WBC: 4.3 K/uL (ref 4.0–10.5)
nRBC: 0 % (ref 0.0–0.2)

## 2024-03-08 LAB — BASIC METABOLIC PANEL WITH GFR
Anion gap: 9 (ref 5–15)
BUN: 9 mg/dL (ref 8–23)
CO2: 26 mmol/L (ref 22–32)
Calcium: 9 mg/dL (ref 8.9–10.3)
Chloride: 103 mmol/L (ref 98–111)
Creatinine, Ser: 1.02 mg/dL (ref 0.61–1.24)
GFR, Estimated: >60 mL/min (ref >60)
Glucose, Bld: 107 mg/dL — ABNORMAL HIGH (ref 70–99)
Potassium: 3.6 mmol/L (ref 3.5–5.1)
Sodium: 138 mmol/L (ref 135–145)

## 2024-03-08 MED ORDER — OLANZAPINE 5 MG PO TBDP
5.0000 mg | ORAL_TABLET | Freq: Every day | ORAL | Status: DC
  Administered 2024-03-08 – 2024-03-11 (×4): 5 mg via ORAL
  Filled 2024-03-08 (×4): qty 1

## 2024-03-08 MED ORDER — LORAZEPAM 0.5 MG PO TABS
0.5000 mg | ORAL_TABLET | Freq: Two times a day (BID) | ORAL | Status: DC
  Administered 2024-03-08 – 2024-03-09 (×3): 0.5 mg via ORAL
  Filled 2024-03-08 (×3): qty 1

## 2024-03-08 MED ORDER — LISINOPRIL 5 MG PO TABS
5.0000 mg | ORAL_TABLET | Freq: Every day | ORAL | Status: DC
  Administered 2024-03-09 – 2024-03-12 (×4): 5 mg via ORAL
  Filled 2024-03-08 (×5): qty 1

## 2024-03-08 NOTE — Progress Notes (Signed)
 Physical Therapy Session Note  Patient Details  Name: Frank Fischer MRN: [id number] Date of Birth: 05-19-1961  Today's Date: 03/08/2024 PT Individual Time: 1130-1200 PT Individual Time Calculation (min): 30 min  PT Individual Time: 1000-1100 PT Individual Time Calculation (min): 60 min   Short Term Goals: Week 1:  PT Short Term Goal 1 (Week 1): pt will transfer bed<>chair with LRAD and CGA PT Short Term Goal 2 (Week 1): pt will transfer sit<>stand with LRAD and CGA PT Short Term Goal 3 (Week 1): pt will ambulate 79ft with LRAD and CGA  Skilled Therapeutic Interventions/Progress Updates:    Pt in enclosed bed when PT arrived, pt willing to participate with therapy.  Pt able to transfer out of bed with Supervision, donned shoes with Min A.  Sit to stand Supervision.  Gait tx with RW from room around day gym, to [facility] station and back with x3 rest breaks.  Pt demonstrates moderate RLE shuffled pattern, unsteady control of RW with turns and requires moderate, repetitive cueing to keep RW at close proximity.  Right QL/iliopsoas cueing provided to facilitate right foot clearance, stride length.  Minimal/very short carry over noted. Pt performed NuStep level 1 x3', level 3 x11'. Pt amb back to room, stayed up in w/c with safety/alarm belt on and tray/call bell within reach.  Second Session: Pt up in w/c, agreeable for session.  Pt amb around day gym with RW to [facility] elevators with cueing for QL/iliopsoas facilitation to clear RLE throughout session and back to room.  Pt requires moderate cueing to keep RW in close proximity esp with turning.  Stand to sit xfer cueing for proper sequencing needed with minimal carry over. Pt sitting up in w/c end of session with all items within reach, seat belt attached and turned on.     Therapy Documentation Precautions:  Precautions Precautions: Fall Precaution/Restrictions Comments: R hemi with inattention Restrictions Weight Bearing Restrictions Per  Provider Order: No    Pain:      Therapy/Group: Individual Therapy  [name] Fast 03/08/2024, 12:58 PM

## 2024-03-08 NOTE — Progress Notes (Signed)
 Occupational Therapy Session Note  Patient Details  Name: [name] MRN: [id number] Date of Birth: 20-Jul-1960  Today's Date: 03/08/2024 OT Individual Time: 9098-9071 OT Individual Time Calculation (min): 27 min    Short Term Goals: Week 1:  OT Short Term Goal 1 (Week 1): Pt will be able to hold stand balance with CGA to wash bottom. OT Short Term Goal 2 (Week 1): pt will be able to pull pants over hips with mod A. OT Short Term Goal 3 (Week 1): Pt will don shirt with min A . OT Short Term Goal 4 (Week 1): Pt will demonstrate improved R side visual awareness to scan to his R side with min cues vs max cues needed on evaluation .  Skilled Therapeutic Interventions/Progress Updates:    Pt received getting out of vail bed with nursing staff following incontinent void in brief. Pt handed off to this OT for skilled therapy session. Pt presenting with aphasia- utilized yes/no questions and gestures to optimize communication. Pt reporting and presenting with 0/10 pain- OT offering intermittent rest breaks, repositioning, and therapeutic support to optimize participation in therapy session. Focused this session on R side attention, R UE functional use, and problem solving skills within context of ADLs. Positioned Pt at sink in w/c for ADLs. Doffed OH shirt with SUP following demonstration on use of hemi-technique. UB bathing completed in seated position with MIN A to ensure cleanliness when washing R/L UEs- MOD multimodal cues required to continue to next step as Pt would perseverate on washing face repeatedly. Stood while holding sink with SUP to wash peri-areas and sat in w/c to wash lower B LEs with verbal cues required for initiation. Pt attempting to utilize R UE during bathing tasks with difficulty noted maintaining grasp on wash cloth d/t inattention vs Proprioception vs Sensation deficit. MIN A required when donning OH shirt- assistance required to orient shirt d/t difficulty problem solving and  with weaving R UE as Pt weaved L UE and head, however unaware his R arm was not in his shirt. MIN A required to weave R LE into pants, stood to bring pants to waist with SUP. Engaged Pt in eating breakfast to facilitate R visual scanning, R side attention, and R UE functional use. MOD HoH A required to maintain grasp on fork/spoon with R hand and MAX multimodal cues required to locate food items in R visual field. Pt attempting to utilize L hand to finger feed and attending primarily to L side of plate. Pt handed off to nursing staff for morning medications with all immediate needs met.   Therapy Documentation Precautions:  Precautions Precautions: Fall Precaution/Restrictions Comments: R hemi with inattention Restrictions Weight Bearing Restrictions Per Provider Order: No   Therapy/Group: Individual Therapy  Frank Fischer 03/08/2024, 9:20 AM

## 2024-03-08 NOTE — Progress Notes (Signed)
 Occupational Therapy Session Note  Patient Details  Name: Frank Fischer MRN: [id number] Date of Birth: 03/16/1961  Today's Date: 03/08/2024 OT Individual Time: 1339-1400 OT Individual Time Calculation (min): 21 min  9 mins missed; delay in care   Short Term Goals: Week 1:  OT Short Term Goal 1 (Week 1): Pt will be able to hold stand balance with CGA to wash bottom. OT Short Term Goal 2 (Week 1): pt will be able to pull pants over hips with mod A. OT Short Term Goal 3 (Week 1): Pt will don shirt with min A . OT Short Term Goal 4 (Week 1): Pt will demonstrate improved R side visual awareness to scan to his R side with min cues vs max cues needed on evaluation .  Skilled Therapeutic Interventions/Progress Updates:  Pt greeted seated in w/c, pt initially declined session but then requested to get out of the room and more agreeable to session. Total A transport to gym in w/c.   Session focused on visual scanning to R side with pt instructed to match cards on R side of board to challenge RUE coordination and scanning. Pt unable to scan to R side to locate board therefore relocated board to mid line but pt still unable to use RUE to place cards on board. Pt needed total A to place 4/4 cards on board.   Directly handed pt off to SLP for next session.   Therapy Documentation Precautions:  Precautions Precautions: Fall Precaution/Restrictions Comments: R hemi with inattention Restrictions Weight Bearing Restrictions Per Provider Order: No  Pain: No pain   Therapy/Group: Individual Therapy  Ronal Mallie Needy 03/08/2024, 3:26 PM

## 2024-03-08 NOTE — Progress Notes (Addendum)
 PROGRESS NOTE   Subjective/Complaints:  Please let me go home Discussed with neuropsych , pt wants to go home to smoke, pt with reduced awareness of deficits Agitation without aggression  Labs reviewed ROS: Limited due to cognitive/behavioral/aphasia   Objective:   No results found.  Recent Labs    03/08/24 0628  WBC 4.3  HGB 14.9  HCT 44.2  PLT 266    Recent Labs    03/08/24 0628  NA 138  K 3.6  CL 103  CO2 26  GLUCOSE 107*  BUN 9  CREATININE 1.02  CALCIUM  9.0     Intake/Output Summary (Last 24 hours) at 03/08/2024 1016 Last data filed at 03/08/2024 0800 Gross per 24 hour  Intake 356 ml  Output 125 ml  Net 231 ml         Physical Exam: Vital Signs Blood pressure (!) 105/55, pulse (!) 54, temperature 97.9 F (36.6 C), temperature source Oral, resp. rate 18, height 5' 7 (1.702 m), weight 84 kg, SpO2 97%.    General: No acute distress Mood and affect are appropriate Heart: Regular rate and rhythm no rubs murmurs or extra sounds Lungs: Clear to auscultation, breathing unlabored, no rales or wheezes Abdomen: Positive bowel sounds, soft nontender to palpation, nondistended Extremities: No clubbing, cyanosis, or edema Skin: No evidence of breakdown, no evidence of rash   4- RIght Delt bi tri grip, 4- RIght hip flex , 4- Knee ext 5/5 Left side  Aphasia limits exam   Skin: No evidence of breakdown, no evidence of rash Neurologic: pt alert, mumbles, oriented to person, not place. Can follow simple commands. Limited insight and awareness.  CN cannot cooperate.  Reduced sensation on the right side to pinch. No abnl resting tone. No obvious limb ataxia.  Musc: No joint swelling in the upper or lower limbs   Assessment/Plan: 1. Functional deficits due to CVA now with AMS  Care Tool:  Bathing    Body parts bathed by patient: Chest, Abdomen, Right upper leg, Face, Right arm, Left upper leg,  Left arm, Front perineal area, Buttocks   Body parts bathed by helper: Right lower leg, Left lower leg     Bathing assist Assist Level: Minimal Assistance - Patient > 75%     Upper Body Dressing/Undressing Upper body dressing   What is the patient wearing?: Pull over shirt    Upper body assist Assist Level: Moderate Assistance - Patient 50 - 74%    Lower Body Dressing/Undressing Lower body dressing      What is the patient wearing?: Incontinence brief, Pants     Lower body assist Assist for lower body dressing: Moderate Assistance - Patient 50 - 74%     Toileting Toileting    Toileting assist Assist for toileting: Maximal Assistance - Patient 25 - 49%     Transfers Chair/bed transfer  Transfers assist     Chair/bed transfer assist level: Contact Guard/Touching assist     Locomotion Ambulation   Ambulation assist      Assist level: 2 helpers Assistive device: No Device Max distance: 160ft   Walk 10 feet activity   Assist     Assist level: 2  helpers Assistive device: No Device   Walk 50 feet activity   Assist    Assist level: 2 helpers Assistive device: No Device    Walk 150 feet activity   Assist    Assist level: 2 helpers Assistive device: No Device    Walk 10 feet on uneven surface  activity   Assist Walk 10 feet on uneven surfaces activity did not occur: Safety/medical concerns (decreased balance, poor attention/awareness)         Wheelchair     Assist Is the patient using a wheelchair?: Yes Type of Wheelchair: Manual    Wheelchair assist level: Dependent - Patient 0% Max wheelchair distance: 151ft    Wheelchair 50 feet with 2 turns activity    Assist        Assist Level: Dependent - Patient 0%   Wheelchair 150 feet activity     Assist      Assist Level: Dependent - Patient 0%   Blood pressure (!) 105/55, pulse (!) 54, temperature 97.9 F (36.6 C), temperature source Oral, resp. rate 18,  height 5' 7 (1.702 m), weight 84 kg, SpO2 97%.  Medical Problem List and Plan: 1. 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             -patient may shower  -10/28 discharge date, patient with poor awareness of deficits, if he were to leave the hospital it would be AMA   -continue enclosure bed for safety, poor balance and no RIght sided awareness 2.  Antithrombotics: -DVT/anticoagulation:  Mechanical: Sequential compression devices, below knee Bilateral lower extremities Pharmaceutical: Lovenox              -antiplatelet therapy: Aspirin  3. Pain Management: Tylenol  prn  4. Mood/Behavior/Sleep: LCSW to follow for evaluation and support when available.              -antipsychotic agents: seroquel scheduled TID             - sleep chart Ativan  IM or po 1mg  q6h prn  10/18 remains irritable and agitated. Will increase seroquel to 50mg  TID and observe behavior. Unsure how much this will help given his ETOH hx  10/19 perhaps a little improvement with 50mg  tid--observe for behavior/tolerance today 5. Neuropsych/cognition: This patient is not capable of making decisions on his own behalf.             -hope to see further improvement in arousal/cognition as Pb tapers off.  Will use ativan  low dose BID plus zyprexa at bedtime to help management on ST basis 6. Skin/Wound Care: Routine pressure relief measures 7. Fluids/Electrolytes/Nutrition: Monitor intake and output.  Follow-up chemistries in AM.             - Ensure+ high protein +multivitamin+folic acid               -encourage adequate PO 8.  Hypertension: on home Amlodipine  10 mg and lisinopril  40 mg Vitals:   03/07/24 2018 03/08/24 0635  BP: (!) 102/50 (!) 105/55  Pulse: (!) 51 (!) 54  Resp: 18 18  Temp: 98.5 F (36.9 C) 97.9 F (36.6 C)  SpO2: 100% 97%   Controlled on lisinopril  10mg  and no amlodipine  10/19.  Reduce lisinopril  to 5mg  ,  difficult for LT management since pt at risk for BP spike due to polysubstance abuse at home  9. BPH: Flomax  0.4 mg 10. HLD: LDL 111 goal < 70--Zetia  10 mg  and atorvastatin  80 mg 11.  Poststroke dysphagia: SLP consult continue dysphagia 3 12.  Tobacco abuse: nicotine patch 7 mg  13.  Polysubstance abuse: Tox screen positive for cocaine and marijuana. 14.  Acute alcohol withdrawal: History of dependence drinking at least 6 beers daily. On 10/7 pt experienced restlessness/agitation and bradycardia concerning for ETOH withdrawal.              -continue Thiamine   -10/13 placed on phenobarbital taper which has been completed              15.  Hypokalemia:K+replaced with 40 mEq on 10/8. Now 3.5 and stable. Continue to monitor     Latest Ref Rng & Units 03/08/2024    6:28 AM 03/04/2024    6:21 AM 03/01/2024    2:20 AM  BMP  Glucose 70 - 99 mg/dL 892  897  87   BUN 8 - 23 mg/dL 9  13  15    Creatinine 0.61 - 1.24 mg/dL 8.97  8.88  9.00   Sodium 135 - 145 mmol/L 138  135  138   Potassium 3.5 - 5.1 mmol/L 3.6  3.4  3.9   Chloride 98 - 111 mmol/L 103  101  105   CO2 22 - 32 mmol/L 26  23  18    Calcium  8.9 - 10.3 mg/dL 9.0  8.9  8.8    Give KCL 40meq x 1 16. Syncopal, unresponsive episode 10/11. (BP 43/34 at the time) -neuro work up negative for acute infarct -monitor orthostatic vs -TEDS and abdominal binder to help maintain bp -encourage PO, monitor I/O's. Off IVF currently 10/19 labs Monday  17.  Bradycardia mainly at night, no obvious medications, will check EKG.  Last EKG on 02/23/2024 showed normal sinus rhythm rate 66 bpm 10/16-19 /2025 Sinus rhythm rate in 50's   LOS: 7 days A FACE TO FACE EVALUATION WAS PERFORMED    Prentice FORBES Compton 03/08/2024, 10:16 AM

## 2024-03-08 NOTE — Progress Notes (Signed)
 Speech Language Pathology Daily Session Note  Patient Details  Name: Frank Fischer MRN: [id number] Date of Birth: [birth year]  Today's Date: 03/08/2024 SLP Individual Time: 1400-1420 SLP Individual Time Calculation (min): 20 min  Short Term Goals: Week 1: SLP Short Term Goal 1 (Week 1): Pt will answer mildly complex yes/no questions via multimodal communication with 75% accuracy provided mod A cues SLP Short Term Goal 2 (Week 1): Patient will consume least restrictive diet with use of safe swallowing strategies given sup A SLP Short Term Goal 3 (Week 1): Pt will verbalize basic biographical/ personal information with 50% acc and max A SLP Short Term Goal 4 (Week 1): Pt will complete functional naming tasks w/ max A SLP Short Term Goal 5 (Week 1): Patient will attend to midline during functional tasks given max multimodal A  Skilled Therapeutic Interventions: Skilled therapy session focused on communication goals. Direct handoff from OT. SLP facilitated session by prompting patient to sequence numbers. Patient with no attempts to participate nor verbalize automatic speech tasks (numbers, name). SLP then attempted targeting dysphagia goals, however PT refused all PO. SLP encouraged participation in ST session educating patient on importance of tx for functional gains. Patient verbalized 'no' multiple times and throughout all y/n questions posed by SLP. Patient then closed eyes and ceased responding to SLP. SLP returned patient to room and he followed single step (functional) directions to stand and pivot to bed. Patient would not attempt to point to red button when SLP asked patient to identify call bell. Remaining 40 minutes of session missed due to refusal. SLP to attempt to return as therapist is able and patient is willing to participate.  Patient left in bed with alarm set and call bell in reach. Continue POC.    Pain None visualized   Therapy/Group: Individual Therapy  Philippa Vessey  M.A., CCC-SLP 03/08/2024, 7:48 AM

## 2024-03-08 NOTE — Consult Note (Signed)
 Neuropsychological Consultation Comprehensive Inpatient Rehab   Patient:   Frank Fischer   DOB:   1961/04/14  MR Number:  [id number]  Location:  [facility] [address] CENTER A [address] [place] [address] Dept: [phone] Loc: [phone]           Date of Service:   03/08/2024  Start Time:   9 AM End Time:   10 AM  Provider/Observer:  Norleen Asa, Psy.D.       Clinical Neuropsychologist       Billing Code/Service: [phone]  REASON FOR CONSULTATION:   Frank Fischer is a 63 year old male referred for neuropsychological consultation during his ongoing admission to the comprehensive inpatient rehabilitation unit.  The patient has had significant cognitive and behavioral disruption following a recent stroke and has had times of significant agitation, noncompliance along with expressive aphasia with paraphasic errors and word substitution with significant left frontal lobe related deficits.  These include impulse control deficits, agitation, expressive language deficits.  HISTORY OF PRESENT ILLNESS: Admitted on 02/20/2024 for a code stroke presenting with left gaze, right hemiplegia, and global aphasia/dysarthria. Treatment included TNK and thrombectomy. Hospital course was complicated by severe alcohol withdrawal, treated sequentially with Librium and a phenobarbital taper. UDS positive for Cocaine and THC on 10/4.  Following stabilization, transferred to the comprehensive inpatient rehabilitation unit. On 02/28/2024, an episode of unresponsiveness with systolic blood pressure in the 50s-60s occurred, which improved with Trendelenburg positioning. Mental status has slowly improved since, though dysarthria persists. Intermittent agitation, irritability, and physical behaviors have been noted, requiring an enclosure bed for safety. Perseverates on wanting to go home to smoke cigarettes.  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 Patient has also had times of agitation and needing security to assist patient return to room.  Requires constant cueing to use the right upper extremity and to look toward the right. Demonstrates fair strength and targeting with right upper extremity reach but has difficulty sustaining attention and is quick to discontinue exercises. Has shown intermittent agitation with irritability and has been physically aggressive at times. Refuses to let go of objects/surfaces. Has needed an enclosure bed.  MRI: 02/20/2024  IMPRESSION: 1. Large early subacute left MCA territory infarct, predominantly posterior, with small petechial hemorrhage (Heidelberg class 1). 2. Left ICA occlusion.  Left MCA is now patent. 3. Multiple chronic infarcts in the right MCA and PCA territories. 4. 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   Medical History:   Past Medical History:  Diagnosis Date   Arthritis    Cervical stenosis of spine    Chronic headaches    Dental caries    periodontal disease   Frequency of urination    History of cerebral infarction 11-28-2017  per pt never had symptoms   per MRI imaging 10-31-2016 2 small chronic lacunar infartions in the right frontal periventricular white matter , and chronic left medial orbital fracture (as seen 2014 MRI)   Hypertension    Noncompliance with medication regimen    Prostate cancer [facility]) urologist-  dr  winter/  oncologist-  dr  patrcia   dx 07-21-2017--- Stage T1c, Gleason 4+3,  PSA 13.40,  vol 49.7cc--  scheduled for radioactive seed implants 12-03-2017         Patient Active Problem List   Diagnosis Date Noted   CVA (cerebral vascular accident) (HCC) 03/01/2024   Syncope, vasovagal 02/28/2024   Syncope and collapse 02/28/2024   Acute CVA (cerebrovascular accident) (HCC) 02/27/2024   Alcohol withdrawal (HCC) 02/24/2024   Polysubstance use disorder 02/24/2024   Tobacco use 02/24/2024   Ischemic stroke (HCC) 02/21/2024   Immunization declined 07/22/2023   History of urinary retention 07/31/2022   Cerebrovascular accident (CVA) (HCC) 02/26/2019   Polysubstance abuse (HCC) 02/26/2019   Malignant neoplasm of prostate (HCC) 10/02/2017   Noncompliance with medication regimen 12/18/2016   Chronic tension type headache 10/20/2016   Headache 10/19/2013   Itching 10/19/2013   Smoker 10/19/2013   Essential hypertension, benign 07/08/2013   Pain in joint, shoulder region 07/08/2013   Cervical disc disorder with radiculopathy of cervical region 07/08/2013   Spinal stenosis in cervical region 07/07/2013   Tendinitis of left rotator cuff 07/07/2013    Behavioral Observation/Mental Status:   Frank Fischer  presents as a 63 y.o.-year-old Right handed African American Male who appeared his stated age. his dress was Appropriate and he was Well Groomed and his manners were Appropriate, inappropriate to the situation.  his participation was indicative of Intrusive, Inattentive, and Resistant behaviors.  There were physical disabilities noted.  he displayed an inappropriate level of cooperation and motivation.    Interactions:    Active Inattentive and Resistant  Attention:   abnormal and attention span appeared shorter than expected for age  Memory:   abnormal; global memory impairment noted  Visuo-spatial:   not examined  Speech (Volume):  normal  Speech:   non-fluent aphasia;   Thought Process:  Irrelevant,  Tangential, and Disorganized  Circumstantial, Distracted, Irrational, and Incoherent  Though Content:  Rumination; obsessions  Orientation:   person and place  Judgment:   Poor  Planning:   Poor  Affect:    Irritable, Labile, and Resistant  Mood:    Angry  Insight:   Lacking  Intelligence:   normal  History of Substance Use or Abuse:  There is a documented history of alcohol, cocaine, and marijuana abuse confirmed by Medical records and UDS.    Family Med/Psych History:  Family History  Problem Relation Age of Onset   Lung cancer Mother    Hypertension Brother    Diabetes Paternal Grandfather    Prostate cancer Cousin     Risk of Suicide/Violence: moderate patient with very poor impulse control and agitation but has not been physically aggressive towards others but has been physically agitated and grabbing onto objects with refusal to let go etc.  Patient is currently in vail bed .  IMPRESSION:  During today's clinical visit, patient was agitated and frustrated in his enclosure bed.  Patient showed perseverative speech and perseverative thoughts and had very limited expressive language capacities perseverating on words like cigarettes and would use the word cigarette when trying to talk about other things.  Patient confused and agitated and insisted on going home at various times.  Patient did not attempt to get out of the enclosure bed during our visit even though it was up to open for our discussion.  However, perseverative thinking expressive language impulse control issues are all consistent with left frontal brain involvement and a stroke.  At  this point, behavioral dyscontrol lead to the largest challenge and therapeutic interventions.  This is also likely a challenge for discharge planning.  PLAN:  Continue with comprehensive inpatient rehabilitation. Manage agitation and behavioral dysregulation to facilitate therapeutic participation. Family contact initiated to discuss  disposition planning.          Electronically Signed   _______________________ Norleen Asa, Psy.D. Clinical Neuropsychologist

## 2024-03-09 NOTE — Plan of Care (Signed)
 Behavioral Plan   [place]ancho Level: n/a  Behavior to decrease/ eliminate:  - agitation (physical and verbal outburst)  Changes to environment:  -Lights on, blinds open during the day; off and closed at night  Interventions: Pt does have a sleep wake chart - pt can sit out of the enclosure bed during the day when calm with safety alarm belt in place - if patient acting out or physically aggressive assist into enclosure bed -patient to sleep in enclosure bed with it closed.  -Up in chair for all meals  Recommendations for interactions with patient: -Don't argue with patient -Be patient -can ambulate to the bathroom with min A - stand on his left side so he can see you -Pt with significant right inattention and visual disturbances  - Help patient be successful with tasks  -redirect on topic of d/c, smoking etc  Attendees:  PT, RN, OT

## 2024-03-09 NOTE — Progress Notes (Addendum)
 Patient ID: Frank Fischer, male   DOB: 07-08-1960, 63 y.o.   MRN: [id number]  Team is asking to schedule family education with [name]-significant other to come in. Contacted her and she can come in Friday from 9:00-12:00 for hands on education. She is a CNA also and still works. Pt continues to want to go home but not aware of his deficits. Working on [employer] referral for aide services at home  9:41 AM Have faxed the referral to [employer] for [facility] services

## 2024-03-09 NOTE — Progress Notes (Addendum)
 PROGRESS NOTE   Subjective/Complaints:  That's a  lie was pt response after we discussed reason for admit was CVA Pt unable to amb or dress himself ROS: Limited due to cognitive/behavioral/aphasia   Objective:   No results found.  Recent Labs    03/08/24 0628  WBC 4.3  HGB 14.9  HCT 44.2  PLT 266    Recent Labs    03/08/24 0628  NA 138  K 3.6  CL 103  CO2 26  GLUCOSE 107*  BUN 9  CREATININE 1.02  CALCIUM  9.0     Intake/Output Summary (Last 24 hours) at 03/09/2024 0825 Last data filed at 03/09/2024 0804 Gross per 24 hour  Intake 830 ml  Output --  Net 830 ml         Physical Exam: Vital Signs Blood pressure (!) 110/57, pulse 67, temperature 97.8 F (36.6 C), temperature source Oral, resp. rate 17, height 5' 7 (1.702 m), weight 84 kg, SpO2 100%.    General: No acute distress Mood and affect moderately agitated after discussion regarding no d/c to home today     4- RIght Delt bi tri grip, 4- RIght hip flex , 4- Knee ext 5/5 Left side  Aphasia limits exam   Skin: No evidence of breakdown, no evidence of rash Neurologic: pt alert, mumbles, oriented to person, not place. Can follow simple commands. Limited insight and awareness.  No abnl resting tone. No obvious limb ataxia.  Musc: No joint swelling in the upper or lower limbs   Assessment/Plan: 1. Functional deficits due to CVA now with AMS  Care Tool:  Bathing    Body parts bathed by patient: Chest, Abdomen, Right upper leg, Face, Right arm, Left upper leg, Left arm, Front perineal area, Buttocks   Body parts bathed by helper: Right lower leg, Left lower leg     Bathing assist Assist Level: Minimal Assistance - Patient > 75%     Upper Body Dressing/Undressing Upper body dressing   What is the patient wearing?: Pull over shirt    Upper body assist Assist Level: Moderate Assistance - Patient 50 - 74%    Lower Body  Dressing/Undressing Lower body dressing      What is the patient wearing?: Incontinence brief, Pants     Lower body assist Assist for lower body dressing: Moderate Assistance - Patient 50 - 74%     Toileting Toileting    Toileting assist Assist for toileting: Maximal Assistance - Patient 25 - 49%     Transfers Chair/bed transfer  Transfers assist     Chair/bed transfer assist level: Contact Guard/Touching assist     Locomotion Ambulation   Ambulation assist      Assist level: Minimal Assistance - Patient > 75% Assistive device: Walker-rolling Max distance: [phone]   Walk 10 feet activity   Assist     Assist level: Minimal Assistance - Patient > 75% Assistive device: Walker-rolling   Walk 50 feet activity   Assist    Assist level: Minimal Assistance - Patient > 75% Assistive device: Walker-rolling    Walk 150 feet activity   Assist    Assist level: Minimal Assistance - Patient > 75%  Assistive device: Walker-rolling    Walk 10 feet on uneven surface  activity   Assist Walk 10 feet on uneven surfaces activity did not occur: Safety/medical concerns (decreased balance, poor attention/awareness)         Wheelchair     Assist Is the patient using a wheelchair?: Yes Type of Wheelchair: Manual    Wheelchair assist level: Dependent - Patient 0% Max wheelchair distance: 137ft    Wheelchair 50 feet with 2 turns activity    Assist        Assist Level: Dependent - Patient 0%   Wheelchair 150 feet activity     Assist      Assist Level: Dependent - Patient 0%   Blood pressure (!) 110/57, pulse 67, temperature 97.8 F (36.6 C), temperature source Oral, resp. rate 17, height 5' 7 (1.702 m), weight 84 kg, SpO2 100%.  Medical Problem List and Plan: 1. 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             -patient may  shower  -10/28 discharge date, patient with poor awareness of deficits, if he were to leave the hospital it would be AMA   -continue enclosure bed for safety, poor balance and no RIght sided awareness 2.  Antithrombotics: -DVT/anticoagulation:  Mechanical: Sequential compression devices, below knee Bilateral lower extremities Pharmaceutical: Lovenox              -antiplatelet therapy: Aspirin  3. Pain Management: Tylenol  prn  4. Mood/Behavior/Sleep: LCSW to follow for evaluation and support when available.              -antipsychotic agents: olanzepine qhs             - sleep chart Ativan  IM 1mg  q6h prn Scheduled for 0.5mg  BID   10/18 remains irritable and agitated. Will increase seroquel to 50mg  TID and observe behavior. Unsure how much this will help given his ETOH hx  10/19 perhaps a little improvement with 50mg  tid--observe for behavior/tolerance today 5. Neuropsych/cognition: This patient is not capable of making decisions on his own behalf.             -hope to see further improvement in arousal/cognition as Pb tapers off.  Will use ativan  low dose BID plus zyprexa at bedtime to help management on ST basis 6. Skin/Wound Care: Routine pressure relief measures 7. Fluids/Electrolytes/Nutrition: Monitor intake and output.  Follow-up chemistries in AM.             - Ensure+ high protein +multivitamin+folic acid               -encourage adequate PO 8.  Hypertension: on home Amlodipine  10 mg and lisinopril  40 mg Vitals:   03/08/24 0635 03/08/24 2019  BP: (!) 105/55 (!) 110/57  Pulse: (!) 54 67  Resp: 18 17  Temp: 97.9 F (36.6 C) 97.8 F (36.6 C)  SpO2: 97% 100%   Controlled on lisinopril  10mg  and no amlodipine  10/19.  Reduce lisinopril  to 5mg  , difficult for LT management since pt at risk for BP spike due to polysubstance abuse at home  9. BPH: Flomax  0.4 mg 10. HLD: LDL 111 goal < 70--Zetia  10 mg and atorvastatin  80 mg 11.  Poststroke dysphagia: SLP consult continue dysphagia 3 12.   Tobacco abuse: nicotine patch 7 mg  13.  Polysubstance abuse: Tox screen positive for cocaine and marijuana. 14.  Acute alcohol withdrawal: History of dependence drinking at  least 6 beers daily. On 10/7 pt experienced restlessness/agitation and bradycardia concerning for ETOH withdrawal.              -continue Thiamine   -10/13 placed on phenobarbital taper which has been completed              15.  Hypokalemia:K+replaced with 40 mEq on 10/8. Now 3.5 and stable. Continue to monitor     Latest Ref Rng & Units 03/08/2024    6:28 AM 03/04/2024    6:21 AM 03/01/2024    2:20 AM  BMP  Glucose 70 - 99 mg/dL 892  897  87   BUN 8 - 23 mg/dL 9  13  15    Creatinine 0.61 - 1.24 mg/dL 8.97  8.88  9.00   Sodium 135 - 145 mmol/L 138  135  138   Potassium 3.5 - 5.1 mmol/L 3.6  3.4  3.9   Chloride 98 - 111 mmol/L 103  101  105   CO2 22 - 32 mmol/L 26  23  18    Calcium  8.9 - 10.3 mg/dL 9.0  8.9  8.8    Give KCL 40meq x 1 16. Syncopal, unresponsive episode 10/11. (BP 43/34 at the time) -neuro work up negative for acute infarct -monitor orthostatic vs -TEDS and abdominal binder to help maintain bp -encourage PO, monitor I/O's. Off IVF currently 10/19 labs Monday  17.  Bradycardia mainly at night, no obvious medications, will check EKG.  Last EKG on 02/23/2024 showed normal sinus rhythm rate 66 bpm 10/16-19 /2025 Sinus rhythm rate in 50's   LOS: 8 days A FACE TO FACE EVALUATION WAS PERFORMED    Prentice FORBES Compton 03/09/2024, 8:25 AM

## 2024-03-09 NOTE — Progress Notes (Signed)
 Physical Therapy Session Note  Patient Details  Name: Frank Fischer MRN: [id number] Date of Birth: May 28, 1960  Today's Date: 03/09/2024 PT Individual Time: 1104-1159 PT Individual Time Calculation (min): 55 min   Short Term Goals: Week 1:  PT Short Term Goal 1 (Week 1): pt will transfer bed<>chair with LRAD and CGA PT Short Term Goal 2 (Week 1): pt will transfer sit<>stand with LRAD and CGA PT Short Term Goal 3 (Week 1): pt will ambulate 69ft with LRAD and CGA  Skilled Therapeutic Interventions/Progress Updates: Patient L sidelying in enclosure bed on entrance to room. Patient alert and agreeable to PT session.   Patient reported no pain during session. Pt followed commands appropriately throughout.   Therapeutic Activity: Bed Mobility: Pt performed R sidelying<sit on EOB with supervision for safety. Transfers: Pt performed sit<>stand transfers throughout session with CGA/light minA for safety due to decreased safety awareness. Provided VC for ensure full pivot to sit with B back of knees touching sitting surface.  Wheelchair Mobility:  Pt propelled wheelchair around day room gym with max multimodal cuing at first for pt to attempt to only use B UE to propel, but pt unable to maintain awareness to R UE to use wheel to drive. Pt move to using B LE's to assist with pt requiring demonstrative and tactile cues to press heel into ground to steel/propel. Pt required increased time and several rest breaks. Pt then propelled around day room/nsg loop with min/light modA to avoid bumping into objects or wall on R side due to R neglect/inattention. Pt required several seated rest breaks with increased time to propel distance (165')  Neuromuscular Re-ed: NMR facilitated during session with focus on attention to R side. Proprioceptive feedback, weight shift, dynamic standing balance, coordination. - Pt ambulated short distance in day room gym with L HHA and minA for safety and max cuing for pt to  increase step clearance on R LE with manual facilitation of WB required. - Pt stepped to 6 step with 5lb donned on R LE (only with R LE) with max cuing to increase weight shift to L (tactile and manual facilitation required as well) with pt slightly improved after mass practice on 1st round (rest break required). Pt required same cue son 2nd round. PTA provided increased distance for pt to have R lean error in order for pt to self-correct (only did so a few times), otherwise pt needed min/modA to prevent R LOB. - Pt only using R UE to propel WC in circle (L brake engaged) with multimodal cuing required to maintain had on drive wheel handle, as well as increasing ROM to increase power to propulsion. Pt required several rest breaks due to fatigue.   NMR performed for improvements in motor control and coordination, balance, sequencing, judgement, and self confidence/ efficacy in performing all aspects of mobility at highest level of independence.   Patient sitting in WC at end of session with brakes locked, belt alarm set, and all needs within reach.      Therapy Documentation Precautions:  Precautions Precautions: Fall Precaution/Restrictions Comments: R hemi with inattention Restrictions Weight Bearing Restrictions Per Provider Order: No  Therapy/Group: Individual Therapy  Cherina Dhillon PTA 03/09/2024, 12:19 PM

## 2024-03-09 NOTE — Progress Notes (Signed)
 Physical Therapy Session Note  Patient Details  Name: Frank Fischer MRN: [id number] Date of Birth: 1961/01/07  Today's Date: 03/09/2024 PT Individual Time: 1400-1445 PT Individual Time Calculation (min): 45 min   Short Term Goals: Week 1:  PT Short Term Goal 1 (Week 1): pt will transfer bed<>chair with LRAD and CGA PT Short Term Goal 2 (Week 1): pt will transfer sit<>stand with LRAD and CGA PT Short Term Goal 3 (Week 1): pt will ambulate 13ft with LRAD and CGA  Skilled Therapeutic Interventions/Progress Updates:    Pt in enclosed bed when PT arrived, agreeable for session.  Pt able to to transfer out of bed with Supervision for safety, pt took initiative to donn shoes backwards, able to correct with cueing. Pt leaning over feet close to edge of bed and cueing/CGA to avoid falling OOB.  Pt attempted to get into w/c reaching for armrest and cueing needed to redirect pt to stand with RW into w/c.  Pt requested to use commode, PT wheeled pt into bathroom, pt able to transfer on to commode, donn pants with Supervision.  Pt back into wc and wheeled to room. W/C to RW  tranfser, pt needed cueing for hand placement on RW.  Pt amb from room to day gym with Min A for safety.  Pt demonstrates good RLE swing through pattern and clearance for ~x50' before starting to shuffle right foot.  In day gym, pt instructed to sit on mat, pt sees rolling chair and quickly reaches for chair letting of RW.  Pt very unsteady, redirected to mat and pt able to sit safely on mat.  In sitting with 2# ankle wts, pt performed LAQ PT providing visual and tactile cueing to facilitate movement of targeted mm.  Gait back to room with Min/Mod A for safety, keeping RW at close proximity with turns.  Pt transferred into w/c with mod A for safety, proper transfer sequencing needed.  Pt wheeled to day gym to [employer]bex Kinetron 20 cm/sec x10', 30 cm/sec x10'.  Using LE's, pt pushed self back to room ~x30'.  Pt in w/c with safety belt activated.      Therapy Documentation Precautions:  Precautions Precautions: Fall Precaution/Restrictions Comments: R hemi with inattention Restrictions Weight Bearing Restrictions Per Provider Order: No  Pain:     Therapy/Group: Individual Therapy  [name] Fast 03/09/2024, 2:28 PM

## 2024-03-09 NOTE — Progress Notes (Signed)
 Occupational Therapy Session Note  Patient Details  Name: Frank Fischer MRN: [id number] Date of Birth: Dec 25, 1960  Today's Date: 03/09/2024 OT Individual Time: 0915-1000 OT Individual Time Calculation (min): 45 min    Short Term Goals: Week 1:  OT Short Term Goal 1 (Week 1): Pt will be able to hold stand balance with CGA to wash bottom. OT Short Term Goal 2 (Week 1): pt will be able to pull pants over hips with mod A. OT Short Term Goal 3 (Week 1): Pt will don shirt with min A . OT Short Term Goal 4 (Week 1): Pt will demonstrate improved R side visual awareness to scan to his R side with min cues vs max cues needed on evaluation . Week 2:     Skilled Therapeutic Interventions/Progress Updates:    1:1 Pt received in the wc with safety belt donned. Pt declined showering and changing clothes (already had a shirt and pants on. Offered toileting but declined. In the dayroom on table top focus on bilateral hand tasks with matching cards setup on at midline and slightly to the left of midline. Also searched for coins to match and stack. Pt continues to present with significant visual disturbances; pt with right inattention and decr tracking to the right past midline Pt performed functional ambulation with HHA assistance on the left with min A ~ 178 feet. Pt gf arrived and discussed visual deficits, inattention, perceptual challenges as well as right side weakness. GF and therapist assisted pt with ambulating back to his room and left sitting in w/c with safety belt donned with gf at his side. GF reports she will be present on Friday 9-4 to continue to practice assisting him with more hands with mobility and ADLs.   Therapy Documentation Precautions:  Precautions Precautions: Fall Precaution/Restrictions Comments: R hemi with inattention Restrictions Weight Bearing Restrictions Per Provider Order: No  Pain: No reports of pain   Therapy/Group: Individual Therapy  Claudene Nest  [facility] 03/09/2024, 5:31 PM

## 2024-03-09 NOTE — Progress Notes (Signed)
 Speech Language Pathology Daily Session Note  Patient Details  Name: Frank Fischer MRN: [id number] Date of Birth: 1961-05-05  Today's Date: 03/09/2024 SLP Individual Time: 0802-0900 SLP Individual Time Calculation (min): 58 min  Short Term Goals: Week 1: SLP Short Term Goal 1 (Week 1): Pt will answer mildly complex yes/no questions via multimodal communication with 75% accuracy provided mod A cues SLP Short Term Goal 2 (Week 1): Patient will consume least restrictive diet with use of safe swallowing strategies given sup A SLP Short Term Goal 3 (Week 1): Pt will verbalize basic biographical/ personal information with 50% acc and max A SLP Short Term Goal 4 (Week 1): Pt will complete functional naming tasks w/ max A SLP Short Term Goal 5 (Week 1): Patient will attend to midline during functional tasks given max multimodal A  Skilled Therapeutic Interventions:  Patient was seen in am to address dysphagia management and language. Pt was alert and seated upright in WC upon SLP arrival. NSG present and administering medication. SLP present to observe tolerance of meds whole with water . Pt tolerated large pills one at a time and small pills a few at a time without difficulty. Pt okay to take meds whole with liquid. SLP did note some lingual residue from breakfast meal however, pt declining completion of oral hygiene. SLP attempting to address language through structured task though pt appeared resistant to structured task often turning away or verbalizing, I'm ready to go or I want to go home. SLP reverting to more functional language in context of task; pt attempting to put shoes on. Pt named objects during task with 33% acc and max cues. He answered yes/no environmental and biographical questions with min A. He verbalized his name and brothers name indep . In comparison to last time this SLP treated pt, he demonstrated improved awareness to midline and demonstrated some scanning to left while  looking for his shoes. SLP called pt's wife who provided a list of pt's family and friends. When asked, pt identified/ verbalized some individuals by relation stating, that's my cousin. Pt's friends arrived during session, pt with some spontaneous speech while talking to friends and this SLP. He continues to demonstrate some reduced intelligibility and perseveration. Pt left upright in [facility] with chair alarm active, friends present, and call button within reach. SLP to continue POC.   Pain Pain Assessment Pain Scale: 0-10 Pain Score: 0-No pain  Therapy/Group: Individual Therapy  Joane GORMAN Fuss 03/09/2024, 8:50 AM

## 2024-03-10 MED ORDER — LORAZEPAM 0.5 MG PO TABS
0.5000 mg | ORAL_TABLET | Freq: Once | ORAL | Status: AC
  Administered 2024-03-10: 0.5 mg via ORAL
  Filled 2024-03-10: qty 1

## 2024-03-10 MED ORDER — LORAZEPAM 0.5 MG PO TABS
1.0000 mg | ORAL_TABLET | Freq: Two times a day (BID) | ORAL | Status: DC
  Administered 2024-03-10 – 2024-03-11 (×3): 1 mg via ORAL
  Filled 2024-03-10 (×4): qty 2

## 2024-03-10 NOTE — Progress Notes (Signed)
 Speech Language Pathology Weekly Progress and Session Note  Patient Details  Name: Frank Fischer MRN: [id number] Date of Birth: [birth year]  Beginning of progress report period: March 03, 2024 End of progress report period: March 10, 2024  Today's Date: 03/10/2024 SLP Individual Time: 0810-0900 SLP Individual Time Calculation (min): 50 min  Short Term Goals: Week 1: SLP Short Term Goal 1 (Week 1): Pt will answer mildly complex yes/no questions via multimodal communication with 75% accuracy provided mod A cues SLP Short Term Goal 1 - Progress (Week 1): Met SLP Short Term Goal 2 (Week 1): Patient will consume least restrictive diet with use of safe swallowing strategies given sup A SLP Short Term Goal 2 - Progress (Week 1): Met SLP Short Term Goal 3 (Week 1): Pt will verbalize basic biographical/ personal information with 50% acc and max A SLP Short Term Goal 3 - Progress (Week 1): Not met SLP Short Term Goal 4 (Week 1): Pt will complete functional naming tasks w/ max A SLP Short Term Goal 4 - Progress (Week 1): Not met SLP Short Term Goal 5 (Week 1): Patient will attend to midline during functional tasks given max multimodal A SLP Short Term Goal 5 - Progress (Week 1): Met    New Short Term Goals: Week 2: SLP Short Term Goal 1 (Week 2): STG = LTG due to ELOS  Weekly Progress Updates: Pt has made fair gains and has met 3 of 5 STGs this reporting period due to improved dysphagia, attention and receptive language. Patients gains have been limited due to patients poor participation in therapy sessions. Currently, patient continues to require sup A for swallowing and maxA for attention and communication.  Pt/family eduction ongoing. Pt would benefit from continued ST intervention to maximize cognition, communication and dysphagia in order to maximize functional independence at d/c.    Intensity: Minumum of 1-2 x/day, 30 to 90 minutes Frequency: 3 to 5 out of 7 days Duration/Length  of Stay: 10/28 Treatment/Interventions: Cognitive remediation/compensation;Dysphagia/aspiration precaution training;Internal/external aids;Speech/Language facilitation;Cueing hierarchy;Multimodal communication approach;Patient/family education;Functional tasks   Daily Session  Skilled Therapeutic Interventions:  Initial 10 minutes of session missed due to patient care and toileting. Skilled therapy session focused on communication, cognition and dysphagia goals. SLP facilitated session by encouraging consumption of PO. Patient consumed breakfast tray consisting of D3 solids and thin liquids. Patient required physical assistance to eat, however with complete oral clearance and no s/sx of aspiration. Patient utilized gestures (pointing) to communicate which item he would like to consume. Patient attended to midline with mod-maxA during functional task this date. Continue current diet. SLP targeted communication through naming task. Patient named items with 40% accuracy given maxA, though was perseverative on 'pen.' He identified objects in a FO2 with 45% accuracy. Lastly, patient answered simple yes/no questions re: biographical and environmental questions with 90% accuracy. Patient with improved participation this date! Patient left in [facility] with alarm set and call bell in reach. Continue POC.      Pain None visualized   Therapy/Group: Individual Therapy  Joellyn Grandt M.A., CCC-SLP 03/10/2024, 10:00 AM

## 2024-03-10 NOTE — Progress Notes (Signed)
 Occupational Therapy Weekly Progress Note  Patient Details  Name: Frank Fischer MRN: [id number] Date of Birth: [birth year]  Beginning of progress report period: March 02, 2024 End of progress report period: March 10, 2024    Patient has met 2 of 4 short term goals.  Pt has made some improvements with his balance and use of his RUE.  When pt is calm and interested in completing a task, he is able to utilize his R hand/arm about 75% of the time.  At the start of the week, pt more compliant and participating in therapies.  Behavior escalated due to his frustration with being in the hospital and very impaired insight. A behavior plan established.  Overall, his standing balance, transfers and short distance ambulation only requires CGA to min A depending on his attention levels. He does require more A with dressing due to the visual and perceptual impairments.  Pt's SO has attended 2 sessions but will benefit from further training.      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  Patient not progressing toward long term goals.  See goal revision..  Plan of care revisions:  . Problem: RH Dressing Goal: LTG Patient will perform upper body dressing (OT) Description: LTG Patient will perform upper body dressing with assist, with/without cues (OT). Flowsheets (Taken 03/10/2024 1255) LTG: Pt will perform upper body dressing with assistance level of: (LTG  downgraded due to visual, perceptual, cognitive and motor planning deficits.) Minimal Assistance - Patient > 75% Note: LTG downgraded due to visual, perceptual, cognitive and motor planning deficits.  Goal: LTG Patient will perform lower body dressing w/assist (OT) Description: LTG: Patient will perform lower body dressing with assist, with/without cues in positioning using equipment (OT) Flowsheets (Taken 03/10/2024 1255) LTG: Pt will perform lower body dressing with assistance level of: (LTG downgraded due to visual, perceptual, cognitive and motor planning deficits.) Moderate Assistance - Patient 50 - 74% Note: LTG downgraded due to visual, perceptual, cognitive and motor planning deficits.    Problem: RH Toileting Goal: LTG Patient will perform toileting task (3/3 steps) with assistance level (OT) Description: LTG: Patient will perform toileting task (3/3 steps) with assistance level (OT)  Flowsheets (Taken 03/10/2024 1255) LTG: Pt will perform toileting task (3/3 steps) with assistance level: (LTG downgraded due to visual, perceptual, cognitive and motor planning deficits.) Minimal Assistance - Patient > 75% Note: LTG downgraded due to visual, perceptual, cognitive and motor planning deficits.    Problem: RH Functional Use of Upper Extremity Goal: LTG Patient will use RT/LT upper extremity as a (OT) Description: LTG: Patient will use right/left upper extremity as a stabilizer/gross assist/diminished/nondominant/dominant level with assist, with/without cues during functional activity (OT) Flowsheets (Taken 03/10/2024 1255) LTG: Pt will use upper extremity in functional activity with assistance level of: (LTG downgraded due to visual, perceptual, cognitive and motor planning deficits.) Minimal Assistance - Patient > 75% Note: LTG downgraded due to visual, perceptual, cognitive and motor planning deficits.    Problem: RH Awareness Goal: LTG: Patient will demonstrate awareness during  functional activites type of (OT) Description: LTG: Patient  will demonstrate awareness during functional activites type of (OT) Flowsheets (Taken 03/10/2024 1255) Patient will demonstrate awareness during functional activites type of: Intellectual LTG: Patient will demonstrate awareness during functional activites type of (OT): (LTG downgraded due to visual, perceptual, cognitive and motor planning deficits.) Minimal Assistance - Patient > 75% Note: LTG downgraded due to visual, perceptual, cognitive and motor planning deficits.                OT Short Term Goals Week 1:  OT Short Term Goal 1 (Week 1): Pt will be able to hold stand balance with CGA to wash bottom. OT Short Term Goal 1 - Progress (Week 1): Met OT Short Term Goal 2 (Week 1): pt will be able to pull pants over hips with mod A. OT Short Term Goal 2 - Progress (Week 1): Met OT Short Term Goal 3 (Week 1): Pt will don shirt with min A . OT Short Term Goal 3 - Progress (Week 1): Progressing toward goal OT Short Term Goal 4 (Week 1): Pt will demonstrate improved R side visual awareness to scan to his R side with min cues vs max cues needed on evaluation . OT Short Term Goal 4 - Progress (Week 1): Progressing toward goal Week 2:  OT Short Term Goal 1 (Week 2): STGs = LTGs  Skilled Therapeutic Interventions/Progress Updates:      Therapy Documentation Precautions:  Precautions Precautions: Fall Precaution/Restrictions Comments: R hemi with inattention Restrictions Weight Bearing Restrictions Per Provider Order: No      ADL: ADL Eating: Maximal assistance Grooming: Maximal assistance Upper Body Bathing: Supervision/safety Where Assessed-Upper Body Bathing: Shower Lower Body Bathing: Minimal assistance Where Assessed-Lower Body Bathing: Shower Upper Body Dressing: Moderate assistance Lower Body Dressing: Moderate assistance Toileting: Maximal assistance Where Assessed-Toileting: Teacher, adult education: Archivist Method: Surveyor, minerals: Grab bars, Raised toilet seat Tub/Shower Transfer: Not assessed Film/video editor: Insurance underwriter Method: Warden/ranger: Emergency planning/management officer, Grab bars  Therapy/Group: Individual Therapy  Shenequa Howse 03/10/2024, 12:59 PM

## 2024-03-10 NOTE — Progress Notes (Signed)
 Physical Therapy Session Note  Patient Details  Name: Frank Fischer MRN: [id number] Date of Birth: 28-May-1960  Today's Date: 03/10/2024 PT Individual Time: 8964-8883; 1310 - 1305 PT Individual Time Calculation (min): 41 min   Short Term Goals: Week 1:  PT Short Term Goal 1 (Week 1): pt will transfer bed<>chair with LRAD and CGA PT Short Term Goal 2 (Week 1): pt will transfer sit<>stand with LRAD and CGA PT Short Term Goal 3 (Week 1): pt will ambulate 40ft with LRAD and CGA  SESSION 1 Skilled Therapeutic Interventions/Progress Updates: Patient sitting at nsg station in [facility]. Patient alert and agreeable to PT session.   Patient reported no pain during session.   Therapeutic Activity: Transfers: Pt performed sit<>stand transfers throughout session with CGA for stability and minA for safety due to poor safety awareness.  - Pt ambulated 160' x 2 (rest break between) around nsg/day room loop with overall CGA to maintain standing balance, but minA for safety due to R side neglect in moderately distracting environment. Pt cued to increase step length/clearance on R LE but pt either not following commands, or presented with difficulty in motor planning movement. Pt encouraged to increase step clearance as this could potentially lead to a fall but pt adamant on stating that he is okay and wont fall despite demonstrative education.  - Pt participated in floor transfer from blue mat to hi/low mat with pt cued to perform supine<sidelying on L with use of bridge. Pt performed up to tall kneeling and to stand with supervision/light CGA  Patient sitting in WC at end of session with brakes locked, belt alarm set, and all needs within reach.  SESSION 2 Skilled Therapeutic Interventions/Progress Updates: Patient sitting in WC on entrance to room. Patient alert and agreeable to PT session.   Patient reported no pain. Pt transported outside to [facility] in WC dependently to ambulate on non-compliant surfaces  and  to boost pt morale and chance of scenery (pt appreciated at end). Pt required CGA and continued VC to increase step length/clearance on R LE but pt not responding to cue. Pt also required VC to increase scanning to R side to avoid bumping into bushes. Pt transported back to room in [facility] dependently and transferred back to bed with CGA/minA and VC to increase safety awareness as pt attempted to sit with half of hips not near sitting surface.  Patient supine in enclosure bed at end of session with brakes locked, and all needs within reach.       Therapy Documentation Precautions:  Precautions Precautions: Fall Precaution/Restrictions Comments: R hemi with inattention Restrictions Weight Bearing Restrictions Per Provider Order: No  Therapy/Group: Individual Therapy  Marcelles Clinard PTA 03/10/2024, 12:35 PM

## 2024-03-10 NOTE — Progress Notes (Signed)
 PROGRESS NOTE   Subjective/Complaints:  Remains mildly agitated, has participated with therapy however does not follow therapy instructions. ROS: Limited due to cognitive/behavioral/aphasia   Objective:   No results found.  Recent Labs    03/08/24 0628  WBC 4.3  HGB 14.9  HCT 44.2  PLT 266    Recent Labs    03/08/24 0628  NA 138  K 3.6  CL 103  CO2 26  GLUCOSE 107*  BUN 9  CREATININE 1.02  CALCIUM  9.0     Intake/Output Summary (Last 24 hours) at 03/10/2024 0946 Last data filed at 03/10/2024 0900 Gross per 24 hour  Intake 580 ml  Output --  Net 580 ml         Physical Exam: Vital Signs Blood pressure 126/76, pulse 63, temperature 98.2 F (36.8 C), temperature source Oral, resp. rate 18, height 5' 7 (1.702 m), weight 84 kg, SpO2 99%.   General: No acute distress Mood and affect are appropriate, less agitated Heart: Regular rate and rhythm no rubs murmurs or extra sounds Lungs: Clear to auscultation, breathing unlabored, no rales or wheezes Abdomen: Positive bowel sounds, soft nontender to palpation, nondistended Extremities: No clubbing, cyanosis, or edema Skin: No evidence of breakdown, no evidence of rash    4- RIght Delt bi tri grip, 4- RIght hip flex , 4- Knee ext 5/5 Left side  Aphasia limits exam   Skin: No evidence of breakdown, no evidence of rash Neurologic: pt alert, mumbles, oriented to person, not place. Can follow simple commands. Limited insight and awareness.  No abnl resting tone. No obvious limb ataxia.  Musc: No joint swelling in the upper or lower limbs   Assessment/Plan: 1. Functional deficits due to CVA with R HP, Right neglect and aphasia   Care Tool:  Bathing    Body parts bathed by patient: Chest, Abdomen, Right upper leg, Face, Right arm, Left upper leg, Left arm, Front perineal area, Buttocks   Body parts bathed by helper: Right lower leg, Left lower leg      Bathing assist Assist Level: Minimal Assistance - Patient > 75%     Upper Body Dressing/Undressing Upper body dressing   What is the patient wearing?: Pull over shirt    Upper body assist Assist Level: Moderate Assistance - Patient 50 - 74%    Lower Body Dressing/Undressing Lower body dressing      What is the patient wearing?: Incontinence brief, Pants     Lower body assist Assist for lower body dressing: Moderate Assistance - Patient 50 - 74%     Toileting Toileting    Toileting assist Assist for toileting: Maximal Assistance - Patient 25 - 49%     Transfers Chair/bed transfer  Transfers assist     Chair/bed transfer assist level: Contact Guard/Touching assist     Locomotion Ambulation   Ambulation assist      Assist level: Minimal Assistance - Patient > 75% Assistive device: Hand held assist Max distance: 178   Walk 10 feet activity   Assist     Assist level: Minimal Assistance - Patient > 75% Assistive device: Walker-rolling   Walk 50 feet activity   Assist  Assist level: Minimal Assistance - Patient > 75% Assistive device: Walker-rolling    Walk 150 feet activity   Assist    Assist level: Minimal Assistance - Patient > 75% Assistive device: Walker-rolling    Walk 10 feet on uneven surface  activity   Assist Walk 10 feet on uneven surfaces activity did not occur: Safety/medical concerns (decreased balance, poor attention/awareness)         Wheelchair     Assist Is the patient using a wheelchair?: Yes Type of Wheelchair: Manual    Wheelchair assist level: Dependent - Patient 0% Max wheelchair distance: 149ft    Wheelchair 50 feet with 2 turns activity    Assist        Assist Level: Dependent - Patient 0%   Wheelchair 150 feet activity     Assist      Assist Level: Dependent - Patient 0%   Blood pressure 126/76, pulse 63, temperature 98.2 F (36.8 C), temperature source Oral, resp. rate  18, height 5' 7 (1.702 m), weight 84 kg, SpO2 99%.  Medical Problem List and Plan: 1. 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             -patient may shower  -10/28 discharge date, patient with poor awareness of deficits   -continue enclosure bed for safety, poor balance and no RIght sided awareness 2.  Antithrombotics: -DVT/anticoagulation:  Mechanical: Sequential compression devices, below knee Bilateral lower extremities Pharmaceutical: Lovenox              -antiplatelet therapy: Aspirin  3. Pain Management: Tylenol  prn  4. Mood/Behavior/Sleep: LCSW to follow for evaluation and support when available.              -antipsychotic agents: olanzepine 5mg  at bedtime, d/c after hospital stay             - sleep chart Ativan  IM 1mg  q6h prn Scheduled for 1mg  BID - only during hospitalization    5. Neuropsych/cognition: This patient is not capable of making decisions on his own behalf.             -hope to see further improvement in arousal/cognition as Pb tapers off.  Will use ativan  low dose BID plus zyprexa at bedtime to help management on ST basis 6. Skin/Wound Care: Routine pressure relief measures 7. Fluids/Electrolytes/Nutrition: Monitor intake and output.  Follow-up chemistries in AM.             - Ensure+ high protein +multivitamin+folic acid               -encourage adequate PO 8.  Hypertension: on home Amlodipine  10 mg and lisinopril  40 mg Vitals:   03/10/24 0553 03/10/24 0928  BP: 115/85 126/76  Pulse: 63   Resp: 18   Temp: 98.2 F (36.8 C)   SpO2: 99%    Controlled on lisinopril  10mg  and no amlodipine  10/19.  Reduce lisinopril  to 5mg  , difficult for LT management since pt at risk for BP spike due to polysubstance abuse at home  9. BPH: Flomax  0.4 mg 10. HLD: LDL 111 goal < 70--Zetia  10 mg and atorvastatin  80 mg 11.  Poststroke dysphagia: SLP consult continue dysphagia 3 12.   Tobacco abuse: nicotine patch 7 mg  13.  Polysubstance abuse: Tox screen positive for cocaine and marijuana. 14.  Acute alcohol withdrawal: History of dependence drinking at least 6 beers daily. On 10/7 pt experienced restlessness/agitation and bradycardia  concerning for ETOH withdrawal.              -continue Thiamine   -10/13 placed on phenobarbital taper which has been completed              15.  Hypokalemia:K+replaced with 40 mEq on 10/8. Now 3.5 and stable. Continue to monitor     Latest Ref Rng & Units 03/08/2024    6:28 AM 03/04/2024    6:21 AM 03/01/2024    2:20 AM  BMP  Glucose 70 - 99 mg/dL 892  897  87   BUN 8 - 23 mg/dL 9  13  15    Creatinine 0.61 - 1.24 mg/dL 8.97  8.88  9.00   Sodium 135 - 145 mmol/L 138  135  138   Potassium 3.5 - 5.1 mmol/L 3.6  3.4  3.9   Chloride 98 - 111 mmol/L 103  101  105   CO2 22 - 32 mmol/L 26  23  18    Calcium  8.9 - 10.3 mg/dL 9.0  8.9  8.8   Improved after supplementation  16. Syncopal, unresponsive episode 10/11. (BP 43/34 at the time) -neuro work up negative for acute infarct -monitor orthostatic vs -TEDS and abdominal binder to help maintain bp -encourage PO, monitor I/O's. Off IVF currently 10/19 labs Monday  17.  Bradycardia mainly at night, no obvious medications, will check EKG.  Last EKG on 02/23/2024 showed normal sinus rhythm rate 66 bpm 10/16-19 /2025 Sinus rhythm rate in 50's   LOS: 9 days A FACE TO FACE EVALUATION WAS PERFORMED    Prentice FORBES Compton 03/10/2024, 9:46 AM

## 2024-03-10 NOTE — Plan of Care (Signed)
  Problem: RH Dressing Goal: LTG Patient will perform upper body dressing (OT) Description: LTG Patient will perform upper body dressing with assist, with/without cues (OT). Flowsheets (Taken 03/10/2024 1255) LTG: Pt will perform upper body dressing with assistance level of: (LTG downgraded due to visual, perceptual, cognitive and motor planning deficits.) Minimal Assistance - Patient > 75% Note: LTG downgraded due to visual, perceptual, cognitive and motor planning deficits.  Goal: LTG Patient will perform lower body dressing w/assist (OT) Description: LTG: Patient will perform lower body dressing with assist, with/without cues in positioning using equipment (OT) Flowsheets (Taken 03/10/2024 1255) LTG: Pt will perform lower body dressing with assistance level of: (LTG downgraded due to visual, perceptual, cognitive and motor planning deficits.) Moderate Assistance - Patient 50 - 74% Note: LTG downgraded due to visual, perceptual, cognitive and motor planning deficits.    Problem: RH Toileting Goal: LTG Patient will perform toileting task (3/3 steps) with assistance level (OT) Description: LTG: Patient will perform toileting task (3/3 steps) with assistance level (OT)  Flowsheets (Taken 03/10/2024 1255) LTG: Pt will perform toileting task (3/3 steps) with assistance level: (LTG downgraded due to visual, perceptual, cognitive and motor planning deficits.) Minimal Assistance - Patient > 75% Note: LTG downgraded due to visual, perceptual, cognitive and motor planning deficits.    Problem: RH Functional Use of Upper Extremity Goal: LTG Patient will use RT/LT upper extremity as a (OT) Description: LTG: Patient will use right/left upper extremity as a stabilizer/gross assist/diminished/nondominant/dominant level with assist, with/without cues during functional activity (OT) Flowsheets (Taken 03/10/2024 1255) LTG: Pt will use upper extremity in functional activity with assistance level of: (LTG  downgraded due to visual, perceptual, cognitive and motor planning deficits.) Minimal Assistance - Patient > 75% Note: LTG downgraded due to visual, perceptual, cognitive and motor planning deficits.    Problem: RH Awareness Goal: LTG: Patient will demonstrate awareness during functional activites type of (OT) Description: LTG: Patient will demonstrate awareness during functional activites type of (OT) Flowsheets (Taken 03/10/2024 1255) Patient will demonstrate awareness during functional activites type of: Intellectual LTG: Patient will demonstrate awareness during functional activites type of (OT): (LTG downgraded due to visual, perceptual, cognitive and motor planning deficits.) Minimal Assistance - Patient > 75% Note: LTG downgraded due to visual, perceptual, cognitive and motor planning deficits.

## 2024-03-10 NOTE — Progress Notes (Addendum)
 Patient ID: Frank Fischer, male   DOB: 1960-11-29, 63 y.o.   MRN: [id number]  Have attempted to contact [name]-fiance to give team conference update and moved up discharge date without success and no voice mail available. Will text to convey information, so she will have a heads up to plan for home. Await response. Have let Ben-Clinical Supervisor know of this. Family education prior to discharge home Friday 10/24

## 2024-03-10 NOTE — Progress Notes (Signed)
 Pt did not void at all this shift; per staff and documentation, he doesn't void at night and refuses any hydration. He was offered hydration throughout the shift and he refused. No distress or bladder distention.

## 2024-03-10 NOTE — Progress Notes (Signed)
 Occupational Therapy Session Note  Patient Details  Name: Frank Fischer MRN: [id number] Date of Birth: 12-01-1960  Today's Date: 03/10/2024 OT Individual Time: 1345-1451 OT Individual Time Calculation (min): 66 min  and Today's Date: 03/10/2024 OT Missed Time: 9 Minutes Missed Time Reason: Patient unwilling/refused to participate without medical reason   Short Term Goals: Week 1:  OT Short Term Goal 1 (Week 1): Pt will be able to hold stand balance with CGA to wash bottom. OT Short Term Goal 2 (Week 1): pt will be able to pull pants over hips with mod A. OT Short Term Goal 3 (Week 1): Pt will don shirt with min A . OT Short Term Goal 4 (Week 1): Pt will demonstrate improved R side visual awareness to scan to his R side with min cues vs max cues needed on evaluation .  Skilled Therapeutic Interventions/Progress Updates:    Pt received supine with no c/o pain. He was agreeable to OT session. He came to EOB with very unsafe motor planning, requiring min A to not fall out of the bed. He completed a stand pivot to the w/c with min A d/t unsafe lunge forward. Pt was taken via w/c to the therapy gym. He completed standing level throwing activity focused on sustained attention, sequencing, and dynamic balance. He required CGA- min A overall. Multiple repetitions/games completed with mod cueing for above deficits. Challenged his R visual scanning, R attention, and RUE use by placing bean bags in his R visual quadrant that he grabbed and threw to target. He required min HOH d/t poor attention to limb to coordination grasp/release. Pt completed blocked practice sit <> stand, 3x8 repetitions with no UE support, to challenge generalized strengthening for ADL transfers, as well as increasing functional activity tolerance and cardiorespiratory endurance. Pt required CGA overall and min guiding cues for sequencing. He then completed 2 trials of 120 ft of functional mobility to increase dynamic standing balance  and functional activity tolerance. He required CGA initially but this progressed to min A as his R foot drag worsened with fatigue. Mod cueing provided for increasing stride length. He then stated I want to be done, declining any further activity. He returned to his room via w/c. Pt was left sitting up in the wheelchair with all needs met, chair alarm set, and call bell within reach.   Therapy Documentation Precautions:  Precautions Precautions: Fall Precaution/Restrictions Comments: R hemi with inattention Restrictions Weight Bearing Restrictions Per Provider Order: No  Therapy/Group: Individual Therapy  Nena VEAR Moats 03/10/2024, 8:08 AM

## 2024-03-10 NOTE — Patient Care Conference (Signed)
 Inpatient RehabilitationTeam Conference and Plan of Care Update Date: 03/10/2024   Time: 10:16 AM    Patient Name: Frank Fischer      Medical Record Number: [id number]  Date of Birth: [birth year] Sex: Male         Room/Bed: 4W19C/4W19C-01 Payor Info: Payor: MEDICARE / Plan: MEDICARE PART A AND B / Product Type: *No Product type* /    Admit Date/Time:  03/01/2024  8:08 PM  Primary Diagnosis:  CVA (cerebral vascular accident) [facility])  Hospital Problems: Principal Problem:   CVA (cerebral vascular accident) [facility]) Active Problems:   Cognitive and neurobehavioral dysfunction    Expected Discharge Date: Expected Discharge Date: 03/12/24 (After family education in the am-9-12)  Team Members Present: Physician leading conference: Dr. Prentice Compton Social Worker Present: Rhoda Clement, LCSW Nurse Present: Barnie Ronde, RN PT Present: Sherlean Perks, PT;Dominic Sandoval, PTA OT Present: Recardo Maxwell, OT SLP Present: Blaise Alderman, SLP PPS Coordinator present : Eleanor Colon, SLP     Current Status/Progress Goal Weekly Team Focus  Bowel/Bladder   pt is incontinent of bowel and bladder; last bm 10/19; does not void at night and refuses fluids at night with medication   continue to anticipate needs with toileting   assess bladder function and bowel function each shift    Swallow/Nutrition/ Hydration               ADL's   mod - max A due to impaired cognition and R inattention and R visual field deficits, mobility CGA   min A overall, may need to be downgraded based on cognition and LOS   R side visual scanning and awareness, balance, RUE NMR, pt/fam educ    Mobility   Bed mobility = supervision, transfers = CGA/light minA for safety; gait = minA   supervision  Barrier - decreased compliance to therapy (better this day 10/21), severe R neglect; Focus = NMRE, gait, dynamic standing balance, coordination, attention to task to R, pt family education    Communication                 Safety/Cognition/ Behavioral Observations  severe R inattention   modA   attention to midline    Pain   no pain noted   keep pain minimal to no pain   continue to assess pain each shift and prn    Skin   no skin breakdown   continue to assess skin; pt repositions self in enclosure bed; ROM performed as tolerated  maintain skin integrity      Discharge Planning:  Significant other is scheduled for family training Friday from 9-12. She is aware of the care pt will require and is a CNA herself. She has been here and aware of his personality and want to go home   Team Discussion: Patient admitted post left MCA CVA with profound aphasia, right hemiparesis, no proprioception, visual sensory deficits, right neglect and poor safety awareness.   Patient on target to meet rehab goals: Currently needs CGA - min assist for transfers. Needs mod assist for dressing.   *See Care Plan and progress notes for long and short-term goals.   Revisions to Treatment Plan:  Behavior modification plan activation: agitation   Teaching Needs: Safety, medications, dietary modification, transfers, toileting, etc.   Current Barriers to Discharge: Lack of/limited family support and Behavior  Possible Resolutions to Barriers: Caregiver education DME: wheelchair     Medical Summary Current Status: BPs improved , agitation improving as well, still aphasic  with severe R neglect  Barriers to Discharge: Medical stability;Other (comments)  Barriers to Discharge Comments: apahasia and visuospatial deficits Possible Resolutions to [employer] Focus: adjust meds for agitation , enclosure bed, d/c planning   Continued Need for Acute Rehabilitation Level of Care: The patient requires daily medical management by a physician with specialized training in physical medicine and rehabilitation for the following reasons: Direction of a multidisciplinary physical rehabilitation program to  maximize functional independence : Yes Medical management of patient stability for increased activity during participation in an intensive rehabilitation regime.: Yes Analysis of laboratory values and/or radiology reports with any subsequent need for medication adjustment and/or medical intervention. : Yes   I attest that I was present, lead the team conference, and concur with the assessment and plan of the team.   Fredericka Sober B 03/10/2024, 3:36 PM

## 2024-03-11 DIAGNOSIS — R7303 Prediabetes: Secondary | ICD-10-CM | POA: Insufficient documentation

## 2024-03-11 DIAGNOSIS — J4530 Mild persistent asthma, uncomplicated: Secondary | ICD-10-CM | POA: Insufficient documentation

## 2024-03-11 DIAGNOSIS — J302 Other seasonal allergic rhinitis: Secondary | ICD-10-CM | POA: Insufficient documentation

## 2024-03-11 DIAGNOSIS — M069 Rheumatoid arthritis, unspecified: Secondary | ICD-10-CM | POA: Insufficient documentation

## 2024-03-11 DIAGNOSIS — G894 Chronic pain syndrome: Secondary | ICD-10-CM | POA: Insufficient documentation

## 2024-03-11 DIAGNOSIS — R972 Elevated prostate specific antigen [PSA]: Secondary | ICD-10-CM | POA: Insufficient documentation

## 2024-03-11 DIAGNOSIS — I119 Hypertensive heart disease without heart failure: Secondary | ICD-10-CM | POA: Insufficient documentation

## 2024-03-11 DIAGNOSIS — E559 Vitamin D deficiency, unspecified: Secondary | ICD-10-CM | POA: Insufficient documentation

## 2024-03-11 DIAGNOSIS — M19049 Primary osteoarthritis, unspecified hand: Secondary | ICD-10-CM | POA: Insufficient documentation

## 2024-03-11 DIAGNOSIS — E782 Mixed hyperlipidemia: Secondary | ICD-10-CM | POA: Insufficient documentation

## 2024-03-11 DIAGNOSIS — E785 Hyperlipidemia, unspecified: Secondary | ICD-10-CM | POA: Insufficient documentation

## 2024-03-11 LAB — CBC
HCT: 43 % (ref 39.0–52.0)
Hemoglobin: 14.4 g/dL (ref 13.0–17.0)
MCH: 32.1 pg (ref 26.0–34.0)
MCHC: 33.5 g/dL (ref 30.0–36.0)
MCV: 95.8 fL (ref 80.0–100.0)
Platelets: 275 10*3/uL (ref 150–400)
RBC: 4.49 MIL/uL (ref 4.22–5.81)
RDW: 13.6 % (ref 11.5–15.5)
WBC: 4.8 10*3/uL (ref 4.0–10.5)
nRBC: 0 % (ref 0.0–0.2)

## 2024-03-11 LAB — BASIC METABOLIC PANEL WITH GFR
Anion gap: 9 (ref 5–15)
BUN: 11 mg/dL (ref 8–23)
CO2: 22 mmol/L (ref 22–32)
Calcium: 8.8 mg/dL — ABNORMAL LOW (ref 8.9–10.3)
Chloride: 107 mmol/L (ref 98–111)
Creatinine, Ser: 1.01 mg/dL (ref 0.61–1.24)
GFR, Estimated: >60 mL/min
Glucose, Bld: 96 mg/dL (ref 70–99)
Potassium: 4.1 mmol/L (ref 3.5–5.1)
Sodium: 138 mmol/L (ref 135–145)

## 2024-03-11 NOTE — Plan of Care (Signed)
  Problem: Consults Goal: RH STROKE PATIENT EDUCATION Description: See Patient Education module for education specifics  Outcome: Progressing   Problem: RH BOWEL ELIMINATION Goal: RH STG MANAGE BOWEL WITH ASSISTANCE Description: STG Manage Bowel with mod I Assistance. Outcome: Progressing Goal: RH STG MANAGE BOWEL W/MEDICATION W/ASSISTANCE Description: STG Manage Bowel with Medication with mod I Assistance. Outcome: Progressing   Problem: RH BLADDER ELIMINATION Goal: RH STG MANAGE BLADDER WITH ASSISTANCE Description: STG Manage Bladder With mod I Assistance Outcome: Progressing Goal: RH STG MANAGE BLADDER WITH MEDICATION WITH ASSISTANCE Description: STG Manage Bladder With Medication With mod I Assistance. Outcome: Progressing   Problem: RH SAFETY Goal: RH STG ADHERE TO SAFETY PRECAUTIONS W/ASSISTANCE/DEVICE Description: STG Adhere to Safety Precautions With cues Assistance/Device. Outcome: Progressing   Problem: RH KNOWLEDGE DEFICIT Goal: RH STG INCREASE KNOWLEDGE OF HYPERTENSION Description: Patient and spouse will be able to manage HTN using educational resources for medications and dietary modification independently Outcome: Progressing Goal: RH STG INCREASE KNOWLEDGE OF DYSPHAGIA/FLUID INTAKE Description: Patient and spouse will be able to manage dysphagia using educational resources for medications and dietary modification independently Outcome: Progressing Goal: RH STG INCREASE KNOWLEGDE OF HYPERLIPIDEMIA Description: Patient and spouse will be able to manage HLD using educational resources for medications and dietary modification independently Outcome: Progressing Goal: RH STG INCREASE KNOWLEDGE OF STROKE PROPHYLAXIS Description: Patient and spouse will be able to manage secondary risk using educational resources for medications and dietary modification independently Outcome: Progressing   Problem: RH Vision Goal: RH LTG Vision (Specify) Outcome: Progressing    Problem: Safety: Goal: Non-violent Restraint(s) Outcome: Progressing

## 2024-03-11 NOTE — Progress Notes (Addendum)
 Patient ID: Frank Fischer, male   DOB: 1960-08-26, 63 y.o.   MRN: [id number] Did speak with [name]-fiance to make sure she got my text from yesterday. She was having issues with her phone. Her phone is fixed now and she is aware of his discharge date after family education tomorrow am. Vena wanted a hospital bed and will try to justify. Have ordered wheelchair, 3 in 1, tub bench and hospital bed via [employer]. Will also set up home health hopefully pt will cooperate with.  11:33 AM [name] prefers [facility] and will make referral to them for home health PT OT SP

## 2024-03-11 NOTE — Progress Notes (Signed)
 Occupational Therapy Session Note  Patient Details  Name: Frank Fischer MRN: [id number] Date of Birth: 03-03-1961  Today's Date: 03/11/2024 OT Individual Time: 9041-8947+ 1305-1401 OT Individual Time Calculation (min): 54 min  20 mins missed d/t fatigue   Short Term Goals: Week 2:  OT Short Term Goal 1 (Week 2): STGs = LTGs  Skilled Therapeutic Interventions/Progress Updates:  Session 1: Pt greeted asleep in supine, pt unable to arouse but returned 20 mins later with  pt agreeable to OT intervention.    No pain indicated during session.   Transfers/bed mobility/functional mobility:  Session 1: Pt completed supine>sit with supervision for safety d/t impulsivity. Pt completed sit>stands with CGA with cues for safety awareness as pt does everything impulsiviely.   Therapeutic activity:  Pt completed seated bimanual task with pt instructed to string beads and fold wash cloths with a focus on bilateral integration. Pt unable to complete bead stringing task despite max cues with pt noted to over shoot reaching for beads with RUE. Pt did much better with wash cloth folding task needing + time but overall supervision, no cues needed to motor plan folding task.   ADLs:  UB dressing:pt donned OH shirt with MAX A, provided max cues but pt not respond to any cues.  LB dressing: donned pants with MAX A, pt needed assist to thread RLE and noted impaired motor planning with pt attempting to grab pants with RUE But unable to maintain grasp.  Footwear: donned shoes/socks with MAXA, utilized planned failure to problem solve donning shoes on correct foot, pt able to self correct once pt realized mistakes with motor planning with RUE.   Bathing: pt completed bathing from sitting/standing with MIN A, pt using RUE functionally in shower but decreased ability to hold onto washcloth, would benefit from bath mit Transfers: ambulatory toilet and shower transfer with R HHA with MINA. Pt needed max multimodal  cues to locate shower on R side.  Toileting: continent urine void, pt needed MIN A for 3/3 toileting tasks to manage clothing on R side.   Ended session with pt seated in w/c with all needs within reach and safety belt alarm activated.                        Session 2: Pt greeted supine in bed, pt agreeable to OT intervention.    No pain indicated during session.   Transfers/bed mobility/functional mobility:  Pt completed bed mobility with supervision for safety with min verbal cues needed for safety. Pt completed sit>stands with CGA for safety.   Therapeutic activity:  Pt completed Visual scanning task at BITS to facilitate improved R sided attention with a focus on attempting to educate pt on lighthouse strategy however limited carryover noted. Pt instructed to touch all stimulus on screen from L<>R side with below results- 33% accuracy, 3 mins to complete, 29.40 sec reaction time.   Pt completed RUE coordination task with pt intsructed to use RUE to stack cones positioned in semi circle pattern. Pt with impaired figure ground both under shooting and over shooting reaching with RUE, pt needed max cues to not use LUE.   Pt completed various functional mobility taks with a focus on improved RLE/RUE coordination. Pt instructed to ambulate ~ 10 ft while holding onto bad mittten racket with RUE and balancing birdie on top, pt dropped birded 1x and noted to shuffle RLE duirng gait.   Pt then instructed to Walk while holding  slide  board in both hands to facilitate improved bimanual integration. Pt completed task with MIN A, continued shuffling noted in RLE.    Pt completed seated Ball tosses to promote bimanual integration. Pt able to toss ball back and forth with decreased coordination but overall CGA- supervision.   Pt completed x10 sit>Stands while holding ball with BUEs to facilitate improved GM movement specifically during functional tasks and to promote NMRE to RUE.   ADLs:  Grooming:  pt stood at sink for hand hygiene with MAX multimodal cues to incorporate RUE into task.   Transfers: ambulatory toilet transfer with MIN A HHA on R side, MOD multimodal cues to make sure pt feels toilet on back of legs as pt tends to sit prematurely.  Toileting: pt with continent bowel void needing MIN A for 3/3 toileting tasks mostly for clothing mgmt on R side                 Ended session with pt seated in w/c with all needs within reach and chair alarm activated.                    Therapy Documentation Precautions:  Precautions Precautions: Fall Precaution/Restrictions Comments: R inattention Restrictions Weight Bearing Restrictions Per Provider Order: No     Therapy/Group: Individual Therapy  Ronal Mallie Needy 03/11/2024, 12:14 PM

## 2024-03-11 NOTE — Progress Notes (Signed)
 PROGRESS NOTE   Subjective/Complaints:  Agitation improved , pt aware of d/c date (moved up from 10/28 to 10/24) ROS: Limited due to cognitive/behavioral/aphasia   Objective:   No results found.  Recent Labs    03/11/24 0449  WBC 4.8  HGB 14.4  HCT 43.0  PLT 275    Recent Labs    03/11/24 0449  NA 138  K 4.1  CL 107  CO2 22  GLUCOSE 96  BUN 11  CREATININE 1.01  CALCIUM  8.8*     Intake/Output Summary (Last 24 hours) at 03/11/2024 0810 Last data filed at 03/10/2024 1800 Gross per 24 hour  Intake 720 ml  Output --  Net 720 ml         Physical Exam: Vital Signs Blood pressure 123/71, pulse 72, temperature 98.2 F (36.8 C), temperature source Oral, resp. rate 19, height 5' 7 (1.702 m), weight 84 kg, SpO2 96%.   General: No acute distress Mood and affect are appropriate, less agitated Heart: Regular rate and rhythm no rubs murmurs or extra sounds Lungs: Clear to auscultation, breathing unlabored, no rales or wheezes Abdomen: Positive bowel sounds, soft nontender to palpation, nondistended Extremities: No clubbing, cyanosis, or edema Skin: No evidence of breakdown, no evidence of rash  4- RIght Delt bi tri grip, 4- RIght hip flex , 4- Knee ext 5/5 Left side  Aphasia limits exam  Improving attention to the right side  Skin: No evidence of breakdown, no evidence of rash Neurologic: pt alert, mumbles, oriented to person, not place. Can follow simple commands. Limited insight and awareness.  No abnl resting tone. No obvious limb ataxia.  Musc: No joint swelling in the upper or lower limbs   Assessment/Plan: 1. Functional deficits due to CVA with R HP, Right neglect and aphasia   Care Tool:  Bathing    Body parts bathed by patient: Chest, Abdomen, Right upper leg, Face, Right arm, Left upper leg, Left arm, Front perineal area, Buttocks   Body parts bathed by helper: Right lower leg, Left lower  leg     Bathing assist Assist Level: Minimal Assistance - Patient > 75%     Upper Body Dressing/Undressing Upper body dressing   What is the patient wearing?: Pull over shirt    Upper body assist Assist Level: Moderate Assistance - Patient 50 - 74%    Lower Body Dressing/Undressing Lower body dressing      What is the patient wearing?: Incontinence brief, Pants     Lower body assist Assist for lower body dressing: Moderate Assistance - Patient 50 - 74%     Toileting Toileting    Toileting assist Assist for toileting: Maximal Assistance - Patient 25 - 49%     Transfers Chair/bed transfer  Transfers assist     Chair/bed transfer assist level: Contact Guard/Touching assist     Locomotion Ambulation   Ambulation assist      Assist level: Minimal Assistance - Patient > 75% Assistive device: Hand held assist Max distance: 178   Walk 10 feet activity   Assist     Assist level: Minimal Assistance - Patient > 75% Assistive device: Walker-rolling   Walk 50 feet  activity   Assist    Assist level: Minimal Assistance - Patient > 75% Assistive device: Walker-rolling    Walk 150 feet activity   Assist    Assist level: Minimal Assistance - Patient > 75% Assistive device: Walker-rolling    Walk 10 feet on uneven surface  activity   Assist Walk 10 feet on uneven surfaces activity did not occur: Safety/medical concerns (decreased balance, poor attention/awareness)         Wheelchair     Assist Is the patient using a wheelchair?: Yes Type of Wheelchair: Manual    Wheelchair assist level: Dependent - Patient 0% Max wheelchair distance: 157ft    Wheelchair 50 feet with 2 turns activity    Assist        Assist Level: Dependent - Patient 0%   Wheelchair 150 feet activity     Assist      Assist Level: Dependent - Patient 0%   Blood pressure 123/71, pulse 72, temperature 98.2 F (36.8 C), temperature source Oral, resp.  rate 19, height 5' 7 (1.702 m), weight 84 kg, SpO2 96%.  Medical Problem List and Plan: 1. 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             -patient may shower  -10/28 discharge date, patient with poor awareness of deficits   -continue enclosure bed for safety, poor balance and no RIght sided awareness 2.  Antithrombotics: -DVT/anticoagulation:  Mechanical: Sequential compression devices, below knee Bilateral lower extremities Pharmaceutical: Lovenox              -antiplatelet therapy: Aspirin  3. Pain Management: Tylenol  prn  4. Mood/Behavior/Sleep: LCSW to follow for evaluation and support when available.              -antipsychotic agents: olanzepine 5mg  at bedtime, d/c after hospital stay             - sleep chart Ativan  IM 1mg  q6h prn Scheduled for 1mg  BID - only during hospitalization    5. Neuropsych/cognition: This patient is not capable of making decisions on his own behalf.             -hope to see further improvement in arousal/cognition as Pb tapers off.  Will use ativan  low dose BID plus zyprexa at bedtime to help management on ST basis 6. Skin/Wound Care: Routine pressure relief measures 7. Fluids/Electrolytes/Nutrition: Monitor intake and output.  Follow-up chemistries in AM.             - Ensure+ high protein +multivitamin+folic acid               -encourage adequate PO 8.  Hypertension: on home Amlodipine  10 mg and lisinopril  40 mg Vitals:   03/10/24 2003 03/11/24 0503  BP: (!) 150/85 123/71  Pulse: 67 72  Resp: 18 19  Temp: 98 F (36.7 C) 98.2 F (36.8 C)  SpO2: 100% 96%   Controlled on lisinopril  10mg  and no amlodipine  10/19.  Reduce lisinopril  to 5mg  , difficult for LT management since pt at risk for BP spike due to polysubstance abuse at home  9. BPH: Flomax  0.4 mg 10. HLD: LDL 111 goal < 70--Zetia  10 mg and atorvastatin  80 mg 11.  Poststroke dysphagia: SLP  consult continue dysphagia 3 12.  Tobacco abuse: nicotine patch 7 mg  13.  Polysubstance abuse: Tox screen positive for cocaine and marijuana. 14.  Acute alcohol withdrawal: History of dependence drinking at  least 6 beers daily. On 10/7 pt experienced restlessness/agitation and bradycardia concerning for ETOH withdrawal.              -continue Thiamine   -10/13 placed on phenobarbital taper which has been completed              15.  Hypokalemia:K+replaced with 40 mEq on 10/8. Now 3.5 and stable. Continue to monitor     Latest Ref Rng & Units 03/11/2024    4:49 AM 03/08/2024    6:28 AM 03/04/2024    6:21 AM  BMP  Glucose 70 - 99 mg/dL 96  892  897   BUN 8 - 23 mg/dL 11  9  13    Creatinine 0.61 - 1.24 mg/dL 8.98  8.97  8.88   Sodium 135 - 145 mmol/L 138  138  135   Potassium 3.5 - 5.1 mmol/L 4.1  3.6  3.4   Chloride 98 - 111 mmol/L 107  103  101   CO2 22 - 32 mmol/L 22  26  23    Calcium  8.9 - 10.3 mg/dL 8.8  9.0  8.9   Improved after supplementation  16. Syncopal, unresponsive episode 10/11. (BP 43/34 at the time) -neuro work up negative for acute infarct -monitor orthostatic vs -TEDS and abdominal binder to help maintain bp -encourage PO, monitor I/O's. Off IVF currently 10/19 labs Monday  17.  Bradycardia mainly at night, no obvious medications, will check EKG.  Last EKG on 02/23/2024 showed normal sinus rhythm rate 66 bpm 10/16-19 /2025 Sinus rhythm rate in 50's   LOS: 10 days A FACE TO FACE EVALUATION WAS PERFORMED    Prentice FORBES Compton 03/11/2024, 8:10 AM

## 2024-03-11 NOTE — Progress Notes (Signed)
 Physical Therapy Weekly Progress Note  Patient Details  Name: Frank Fischer MRN: [id number] Date of Birth: Apr 16, 1961  Beginning of progress report period: March 02, 2024 End of progress report period: March 11, 2024  Patient has met 1 of 3 short term goals. Pt has made slow functional progress towards LTG's due to initial tendency to not participate in therapy. Pt has stated multiple times to various disciplines here at inpatient rehab that he did not want to go through therapy despite every attempt to educate and encourage pt to participate in order to decrease caregiver burden and to increase functional independence. Pt ambulates 150'+ with overall CGA for standing balance, but requires max cuing to increase safety awareness due to R side inattention (pt also CGA for transfers but still requires VC for same decreased safety awareness). Pt family education set to take place 10/24 with significant other, which will also be pt's d/c date.   Patient continues to demonstrate the following deficits muscle weakness and muscle paralysis, impaired timing and sequencing, abnormal tone, unbalanced muscle activation, and decreased coordination, and decreased awareness, decreased problem solving, decreased safety awareness, and delayed processing and therefore will continue to benefit from skilled PT intervention to increase functional independence with mobility.  Patient progressing toward long term goals..  Continue plan of care.  PT Short Term Goals Week 1:  PT Short Term Goal 1 (Week 1): pt will transfer bed<>chair with LRAD and CGA PT Short Term Goal 1 - Progress (Week 1): Not met PT Short Term Goal 2 (Week 1): pt will transfer sit<>stand with LRAD and CGA PT Short Term Goal 2 - Progress (Week 1): Met PT Short Term Goal 3 (Week 1): pt will ambulate 69ft with LRAD and CGA PT Short Term Goal 3 - Progress (Week 1): Not met  Skilled Therapeutic Interventions/Progress Updates:      Therapy  Documentation Precautions:  Precautions Precautions: Fall Precaution/Restrictions Comments: R hemi with inattention Restrictions Weight Bearing Restrictions Per Provider Order: No  Dominic Sandoval PTA   03/11/2024, 7:55 AM

## 2024-03-11 NOTE — Progress Notes (Signed)
 Physical Therapy Session Note  Patient Details  Name: Frank Fischer MRN: [id number] Date of Birth: 08-04-1960  Today's Date: 03/11/2024 PT Individual Time: 0815-0855 PT Individual Time Calculation (min): 40 min   Short Term Goals: Week 1:  PT Short Term Goal 1 (Week 1): pt will transfer bed<>chair with LRAD and CGA PT Short Term Goal 1 - Progress (Week 1): Not met PT Short Term Goal 2 (Week 1): pt will transfer sit<>stand with LRAD and CGA PT Short Term Goal 2 - Progress (Week 1): Met PT Short Term Goal 3 (Week 1): pt will ambulate 65ft with LRAD and CGA PT Short Term Goal 3 - Progress (Week 1): Not met  Skilled Therapeutic Interventions/Progress Updates: Patient supine in bed following eating breakfast with nsg (time given for pt to eat due to late arrival) on entrance to room. Patient alert and agreeable to PT session.   Patient reported no pain during session. Pt continues to demonstrate decreased attention to VC's, which is difficult to assess whether it is due to poor motor planning or not wanting to participate during session.   Therapeutic Activity: Bed Mobility: Pt performed supine<>sit on EOB with supervision. Transfers: Pt performed sit<>stand transfers throughout session with minA due to pt's poor safety awareness and max cuing to increase visual tracking of sitting surfaces.  - Pt navigated 8 (6) steps with B UE support (mostly L due to R inattention) and overall minA for safety due to decreased adherence to VC and safety awareness. Pt required multimodal cuing to ensure front of foot touches bottom of next step as pt would only have 3/4 of foot on step. Further stair navigation deferred due to pt's poor safety awareness and decreased following of commands. - Pt cued to toss bean bags to cornhole board (use of L UE to grab bags on R side which needed to be placed slightly in front for pt to see) while standing. Pt lightly tossed bags a foot or two from self, occasionally  missing the bag entirely from grasp of R hand. Pt cued to increase UE extension to achieve greater distance to board. Pt still lightly tossing and when asked if pt was trying pt stated no. When asked what games pt enjoys pt mentioned tossing quarters (difficult to hear what game exactly due to pt's aphasia). Pt cued to toss small blue coins to various colored cups 2 feet away on table (pt sitting) with pt requiring increased time and cuing to locate coin in hand or on lap if fallen. Pt only slightly tossing coin in front and would attempt to grab from floor but pt required assistance to pick up due to visual deficit. Pt then cued to ambulate around nsg/day room loop. Pt with increase in R lean with VC to shift weight to L after ambulating 20' but pt not adhering to cue. PTA asked if pt was giving up on therapy session and not following commands intentionally with pt stating yes. Pt transported back to room for end of session for safety.  Patient supine in bed at end of session with brakes locked, bed alarm set, and all needs within reach.      Therapy Documentation Precautions:  Precautions Precautions: Fall Precaution/Restrictions Comments: R inattention Restrictions Weight Bearing Restrictions Per Provider Order: No  Therapy/Group: Individual Therapy  Maryelizabeth Eberle PTA 03/11/2024, 11:31 AM

## 2024-03-11 NOTE — Discharge Instructions (Signed)
 Inpatient Rehab Discharge Instructions  Frank Fischer  Discharge date and time: 03/12/24   Activities/Precautions/ Functional Status:  Activity: no lifting, driving, or strenuous exercise for until cleared by provider  Diet: Dysphagia 3 DIET   Wound Care: none needed   Functional status:  ___ No restrictions     ___ Walk up steps independently __x_ 24/7 supervision/assistance   ___ Walk up steps with assistance ___ Intermittent supervision/assistance  ___ Bathe/dress independently ___ Walk with walker     __x_ Bathe/dress with assistance ___ Walk Independently    ___ Shower independently __x_ Walk with assistance    __x_ Shower with assistance __X_ No alcohol     ___ Return to work/school ________  Special Instructions:  Use caution with lorazepam  use---limit to only twice daily (12 hours apart).   Refrain from any alcohol or drug use.   My questions have been answered and I understand these instructions. I will adhere to these goals and the provided educational materials after my discharge from the hospital.  Patient/Caregiver Signature _______________________________ Date __________  Clinician Signature _______________________________________ Date __________  Please bring this form and your medication list with you to all your follow-up doctor's appointments. Inpatient Rehab Discharge Instructions      COMMUNITY REFERRALS UPON DISCHARGE:    Home Health:   PT   OT     SP                Agency:[facility] Phone: [phone]   Medical Equipment/Items Ordered:HOSPITAL BED, WHEELCHAIR, BEDSIDE COMMODE AND TUB BENCH                                                 Agency/Supplier:[facility]   [id number]  PCS REFERRAL PREFERS [facility] TO PROVIDE WHERE GIRLFRIEND WORKS SHE WILL FOLLOW UP WITH   STROKE/TIA DISCHARGE INSTRUCTIONS SMOKING Cigarette smoking nearly doubles your risk of having a stroke & is the single most alterable risk factor  If you  smoke or have smoked in the last 12 months, you are advised to quit smoking for your health. Most of the excess cardiovascular risk related to smoking disappears within a year of stopping. Ask you doctor about anti-smoking medications [place] Quit Line: 1-800-QUIT NOW Free Smoking Cessation Classes [phone]  CHOLESTEROL Know your levels; limit fat & cholesterol in your diet  Lipid Panel     Component Value Date/Time   CHOL 159 02/21/2024 0848   TRIG 130 02/21/2024 0848   HDL 41 02/21/2024 0848   CHOLHDL 3.9 02/21/2024 0848   VLDL 26 02/21/2024 0848   LDLCALC 92 02/21/2024 0848   LDLCALC 111 (H) 07/22/2023 1434     Many patients benefit from treatment even if their cholesterol is at goal. Goal: Total Cholesterol (CHOL) less than 160 Goal:  Triglycerides (TRIG) less than 150 Goal:  HDL greater than 40 Goal:  LDL (LDLCALC) less than 100   BLOOD PRESSURE American Stroke Association blood pressure target is less that 120/80 mm/Hg  Your discharge blood pressure is:  BP: 123/73 Monitor your blood pressure Limit your salt and alcohol intake Many individuals will require more than one medication for high blood pressure  DIABETES (A1c is a blood sugar average for last 3 months) Goal HGBA1c is under 7% (HBGA1c is blood sugar average for last 3 months)  Diabetes: No known diagnosis of diabetes  Lab Results  Component Value Date   HGBA1C 5.7 (H) 02/21/2024    Your HGBA1c can be lowered with medications, healthy diet, and exercise. Check your blood sugar as directed by your physician Call your physician if you experience unexplained or low blood sugars.  PHYSICAL ACTIVITY/REHABILITATION Goal is 30 minutes at least 4 days per week  Activity: Increase activity slowly,, No driving,, and Walk with assistance, Therapies: Physical Therapy: Home Health and Occupational Therapy: Home Health Return to work: Do not return to work until cleared  Activity decreases your risk of heart attack and  stroke and makes your heart stronger.  It helps control your weight and blood pressure; helps you relax and can improve your mood. Participate in a regular exercise program. Talk with your doctor about the best form of exercise for you (dancing, walking, swimming, cycling).  DIET/WEIGHT Goal is to maintain a healthy weight  Your discharge diet is:  Diet Order             DIET DYS 3 Room service appropriate? Yes; Fluid consistency: Thin  Diet effective now                  Your height is:  Height: 5' 7 (170.2 cm) Your current weight is: Weight: 84 kg Your Body Mass Index (BMI) is:  BMI (Calculated): 29 Following the type of diet specifically designed for you will help prevent another stroke. Your goal Body Mass Index (BMI) is 19-24. Healthy food habits can help reduce 3 risk factors for stroke:  High cholesterol, hypertension, and excess weight.  RESOURCES Stroke/Support Group:  Call [phone]   STROKE EDUCATION PROVIDED/REVIEWED AND GIVEN TO PATIENT Stroke warning signs and symptoms How to activate emergency medical system (call 911). Medications prescribed at discharge. Need for follow-up after discharge. Personal risk factors for stroke. Pneumonia vaccine given: No Flu vaccine given: Yes, Date 03/12/2024 My questions have been answered, the writing is legible, and I understand these instructions.  I will adhere to these goals & educational materials that have been provided to me after my discharge from the hospital.

## 2024-03-11 NOTE — Discharge Summary (Signed)
 Physician Discharge Summary  Patient ID: Frank Fischer MRN: [id number] DOB/AGE: 08-21-1960 63 y.o.  Admit date: 02/27/2024 Discharge date: 03/11/2024  Discharge Diagnoses:  Principal Problem:   Acute CVA (cerebrovascular accident) [facility]) Active Problems:   Essential hypertension, benign   Pain in joint, shoulder region   Smoker   Malignant neoplasm of prostate (HCC)   Ischemic stroke (HCC)   Alcohol withdrawal (HCC)   Polysubstance use disorder   Syncope, vasovagal   Cognitive and neurobehavioral dysfunction   Discharged Condition: poor  Significant Diagnostic Studies: EEG adult Result Date: 02/28/2024 Michaela Aisha SQUIBB, MD     02/28/2024  6:36 PM History: 63 yo M being evaluated for loss of conciousness with previous strokes, evaluating for seizure predisposition EEG Duration: 30 minutes Sedation: none Patient State: Awake and drowsy Technique: This EEG was acquired with electrodes placed according to the International 10-20 electrode system (including Fp1, Fp2, F3, F4, C3, C4, P3, P4, O1, O2, T3, T4, T5, T6, A1, A2, Fz, Cz, Pz). The following electrodes were missing or displaced: none. Background: The background consists of intermixed alpha and frontally predominant beta activities.  The posterior dominant rhythm is low voltage, but is seen with a frequency of 9 Hz.  There is anterior shifting of the PDR with drowsiness, but sleep is not recorded. Photic stimulation: Physiologic driving is not performed EEG Abnormalities: 1) low voltage PDR Clinical Interpretation: This EEG revealed evidence of bilateral posterior quadrant dysfunction, consistent with the patient's known strokes. There was no seizure or seizure predisposition recorded on this study. Please note that lack of epileptiform activity on EEG does not preclude the possibility of epilepsy. Aisha Michaela, MD [facility] If 7pm- 7am, please page neurology on call as listed in AMION.  CT HEAD WO CONTRAST  ( ) Result Date: 02/28/2024 EXAM: CT HEAD WITHOUT CONTRAST 02/28/2024 02:53:24 PM TECHNIQUE: CT of the head was performed without the administration of intravenous contrast. Automated exposure control, iterative reconstruction, and/or weight based adjustment of the mA/kV was utilized to reduce the radiation dose to as low as reasonably achievable. COMPARISON: MRI head without contrast 02/22/2024. CLINICAL HISTORY: Stroke, follow up. FINDINGS: BRAIN AND VENTRICLES: Expected evolution of the large left MCA territory infarct is noted. No acute hemorrhage is present. Remote infarcts involving the posterior right frontal lobe, right parietal lobe, and right occipital lobe are again noted. Remote lacunar infarcts of the right basal ganglia are stable. Atherosclerotic calcifications are present in the cavernous carotid arteries bilaterally. No hyperdense vessel is present. No evidence of acute infarct. No hydrocephalus. No extra-axial collection. No mass effect or midline shift. ORBITS: No acute abnormality. SINUSES: No acute abnormality. SOFT TISSUES AND SKULL: No acute soft tissue abnormality. No skull fracture. IMPRESSION: 1. No acute intracranial abnormality. 2. Expected evolution of the large left MCA territory infarct. 3. Remote infarcts involving the posterior right frontal lobe, right parietal lobe, and right occipital lobe. 4. Stable remote lacunar infarcts of the right basal ganglia. 5. Atherosclerotic calcifications in the cavernous carotid arteries bilaterally. No hyperdense vessel. Electronically signed by: Lonni Necessary MD 02/28/2024 04:00 PM EDT RP Workstation: [id number]   MR BRAIN WO CONTRAST Result Date: 02/22/2024 EXAM: MRI BRAIN WITHOUT CONTRAST MRA HEAD WITHOUT CONTRAST 02/22/2024 12:35:18 AM TECHNIQUE: Multiplanar multisequence MRI of the brain was performed without the administration of intravenous contrast. MRA of the head was performed without contrast using time-of-flight technique.  3D postprocessing with multiplanar reformations and MIPs was performed for better evaluation of the vasculature. COMPARISON: 02/26/2019 CLINICAL HISTORY: Stroke,  follow up. FINDINGS: MRI BRAIN: BRAIN AND VENTRICLES: Large early subacute infarct of the left MCA territory predominantly affecting the posterior region. Small amount of petechial hemorrhage in the infarcted region. Multifocal hyperintense T2-weighted signal within the cerebral white matter, most commonly due to chronic small vessel disease. Multiple old infarcts of the right MCA and PCA territories. Abnormal left ICA flow void. No mass or abnormal enhancement. No midline shift. No hydrocephalus. The sella is unremarkable. Normal flow voids. ORBITS: No acute abnormality. SINUSES AND MASTOIDS: No acute abnormality. BONES AND SOFT TISSUES: Normal bone marrow signal. No acute soft tissue abnormality. MRA HEAD: ANTERIOR CIRCULATION: There is no flow related enhancement within the left ICA on the time of flight angiographic images. No significant stenosis of the right internal carotid artery. There is normal flow related enhancement throughout the left MCA. No significant stenosis of the right middle cerebral artery. No significant stenosis of the anterior cerebral arteries. No aneurysm. POSTERIOR CIRCULATION: Unchanged severe stenosis of the right PCA P2 segment. No significant stenosis of the left posterior cerebral artery. No significant stenosis of the basilar artery. No significant stenosis of the vertebral arteries. No aneurysm. IMPRESSION: 1. Large early subacute left MCA territory infarct, predominantly posterior, with small petechial hemorrhage (Heidelberg class 1). 2. Left ICA occlusion.  Left MCA is now patent. 3. Multiple chronic infarcts in the right MCA and PCA territories. 4. 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 Electronically signed by: Franky Stanford MD 02/22/2024 12:52 AM EDT RP Workstation: [id number]   MR ANGIO HEAD WO  CONTRAST Result Date: 02/22/2024 EXAM: MRI BRAIN WITHOUT CONTRAST MRA HEAD WITHOUT CONTRAST 02/22/2024 12:35:18 AM TECHNIQUE: Multiplanar multisequence MRI of the brain was performed without the administration of intravenous contrast. MRA of the head was performed without contrast using time-of-flight technique. 3D postprocessing with multiplanar reformations and MIPs was performed for better evaluation of the vasculature. COMPARISON: 02/26/2019 CLINICAL HISTORY: Stroke, follow up. FINDINGS: MRI BRAIN: BRAIN AND VENTRICLES: Large early subacute infarct of the left MCA territory predominantly affecting the posterior region. Small amount of petechial hemorrhage in the infarcted region. Multifocal hyperintense T2-weighted signal within the cerebral white matter, most commonly due to chronic small vessel disease. Multiple old infarcts of the right MCA and PCA territories. Abnormal left ICA flow void. No mass or abnormal enhancement. No midline shift. No hydrocephalus. The sella is unremarkable. Normal flow voids. ORBITS: No acute abnormality. SINUSES AND MASTOIDS: No acute abnormality. BONES AND SOFT TISSUES: Normal bone marrow signal. No acute soft tissue abnormality. MRA HEAD: ANTERIOR CIRCULATION: There is no flow related enhancement within the left ICA on the time of flight angiographic images. No significant stenosis of the right internal carotid artery. There is normal flow related enhancement throughout the left MCA. No significant stenosis of the right middle cerebral artery. No significant stenosis of the anterior cerebral arteries. No aneurysm. POSTERIOR CIRCULATION: Unchanged severe stenosis of the right PCA P2 segment. No significant stenosis of the left posterior cerebral artery. No significant stenosis of the basilar artery. No significant stenosis of the vertebral arteries. No aneurysm. IMPRESSION: 1. Large early subacute left MCA territory infarct, predominantly posterior, with small petechial  hemorrhage (Heidelberg class 1). 2. Left ICA occlusion.  Left MCA is now patent. 3. Multiple chronic infarcts in the right MCA and PCA territories. 4. 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 Electronically signed by: Franky Stanford MD 02/22/2024 12:52 AM EDT RP Workstation: [id number]   ECHOCARDIOGRAM COMPLETE Result Date: 02/21/2024  ECHOCARDIOGRAM REPORT   Patient Name:   HARVARD ZEISS Date of Exam: 02/21/2024 Medical Rec #:  [id number]      Height:       70.0 in Accession #:    [id number]     Weight:       205.0 lb Date of Birth:  Jan 19, 1961     BSA:          2.109 m Patient Age:    62 years       BP:           150/74 mmHg Patient Gender: M              HR:           50 bpm. Exam Location:  Inpatient Procedure: 2D Echo, 3D Echo, Color Doppler and Cardiac Doppler (Both Spectral            and Color Flow Doppler were utilized during procedure). Indications:    Stroke I63.9  History:        Patient has prior history of Echocardiogram examinations, most                 recent 02/26/2019. Stroke, Arrythmias:Bradycardia; Risk                 Factors:Hypertension, Current Smoker and PSA.  Sonographer:    Koleen Popper RDCS Referring Phys: [id number] NORMIE M BUI IMPRESSIONS  1. Left ventricular ejection fraction, by estimation, is 60 to 65%. Left ventricular ejection fraction by 3D volume is 52 %. The left ventricle has normal function. The left ventricle has no regional wall motion abnormalities. Left ventricular diastolic  parameters were normal.  2. Right ventricular systolic function is normal. The right ventricular size is normal. Tricuspid regurgitation signal is inadequate for assessing PA pressure.  3. The mitral valve is normal in structure. No evidence of mitral valve regurgitation. No evidence of mitral stenosis.  4. The aortic valve was not well visualized. Aortic valve regurgitation is not visualized. No aortic stenosis is present.  5. The inferior vena cava is normal in size with greater than  50% respiratory variability, suggesting right atrial pressure of 3 mmHg.  6. Increased flow velocities may be secondary to anemia, thyrotoxicosis, hyperdynamic or high flow state. Comparison(s): No significant change from prior study. FINDINGS  Left Ventricle: Left ventricular ejection fraction, by estimation, is 60 to 65%. Left ventricular ejection fraction by 3D volume is 52 %. The left ventricle has normal function. The left ventricle has no regional wall motion abnormalities. Strain was performed and the global longitudinal strain is indeterminate. The left ventricular internal cavity size was normal in size. There is no left ventricular hypertrophy. Left ventricular diastolic parameters were normal. Right Ventricle: The right ventricular size is normal. No increase in right ventricular wall thickness. Right ventricular systolic function is normal. Tricuspid regurgitation signal is inadequate for assessing PA pressure. Left Atrium: Left atrial size was normal in size. Right Atrium: Right atrial size was normal in size. Pericardium: There is no evidence of pericardial effusion. Mitral Valve: The mitral valve is normal in structure. No evidence of mitral valve regurgitation. No evidence of mitral valve stenosis. Tricuspid Valve: The tricuspid valve is normal in structure. Tricuspid valve regurgitation is not demonstrated. No evidence of tricuspid stenosis. Aortic Valve: The aortic valve was not well visualized. Aortic valve regurgitation is not visualized. No aortic stenosis is present. Aortic valve mean gradient measures 6.0 mmHg. Aortic valve peak gradient measures 13.5 mmHg.  Aortic valve area, by VTI measures 2.53 cm. Pulmonic Valve: The pulmonic valve was normal in structure. Pulmonic valve regurgitation is not visualized. No evidence of pulmonic stenosis. Aorta: The aortic root is normal in size and structure. Venous: The inferior vena cava is normal in size with greater than 50% respiratory variability,  suggesting right atrial pressure of 3 mmHg. IAS/Shunts: No atrial level shunt detected by color flow Doppler. Additional Comments: 3D was performed not requiring image post processing on an independent workstation and was indeterminate.  LEFT VENTRICLE PLAX 2D LVIDd:         4.50 cm         Diastology LVIDs:         3.24 cm         LV e' medial:    11.60 cm/s LV PW:         0.85 cm         LV E/e' medial:  8.8 LV IVS:        1.09 cm         LV e' lateral:   13.50 cm/s LVOT diam:     1.81 cm         LV E/e' lateral: 7.6 LV SV:         80 LV SV Index:   38 LVOT Area:     2.57 cm        3D Volume EF                                LV 3D EF:    Left                                             ventricul LV Volumes (MOD)                            ar LV vol d, MOD    132.0 ml                   ejection A4C:                                        fraction LV vol s, MOD    51.6 ml                    by 3D A4C:                                        volume is LV SV MOD A4C:   132.0 ml                   52 %.                                 3D Volume EF:                                3D EF:  52 %                                LV EDV:       144 ml                                LV ESV:       69 ml                                LV SV:        75 ml RIGHT VENTRICLE             IVC RV Basal diam:  3.98 cm     IVC diam: 1.92 cm RV S prime:     18.20 cm/s TAPSE (M-mode): 2.4 cm LEFT ATRIUM             Index        RIGHT ATRIUM           Index LA diam:        2.86 cm 1.36 cm/m   RA Area:     12.60 cm LA Vol (A2C):   55.2 ml 26.17 ml/m  RA Volume:   26.90 ml  12.75 ml/m LA Vol (A4C):   26.8 ml 12.70 ml/m LA Biplane Vol: 37.8 ml 17.92 ml/m  AORTIC VALVE AV Area (Vmax):    2.28 cm AV Area (Vmean):   2.22 cm AV Area (VTI):     2.53 cm AV Vmax:           184.00 cm/s AV Vmean:          102.000 cm/s AV VTI:            0.317 m AV Peak Grad:      13.5 mmHg AV Mean Grad:      6.0 mmHg LVOT Vmax:         163.00 cm/s LVOT  Vmean:        88.100 cm/s LVOT VTI:          0.312 m LVOT/AV VTI ratio: 0.98  AORTA Ao Root diam: 2.37 cm MITRAL VALVE MV Area (PHT): 3.42 cm     SHUNTS MV Decel Time: 222 msec     Systemic VTI:  0.31 m MV E velocity: 102.00 cm/s  Systemic Diam: 1.81 cm MV A velocity: 66.60 cm/s MV E/A ratio:  1.53 Vishnu Priya Mallipeddi Electronically signed by Diannah Late Mallipeddi Signature Date/Time: 02/21/2024/4:42:33 PM    Final    IR PERCUTANEOUS ART THROMBECTOMY/INFUSION INTRACRANIAL INC DIAG ANGIO Result Date: 02/21/2024 PROCEDURE PERFORMED: 1. Cerebral angiography with stroke thrombectomy 2. Ultrasound guided vascular access 3. Cone beam CT for treatment planning COMPARISON:  CT angiogram of the head and neck performed February 21, 2024 CLINICAL DATA:  63 year old male with acute ischemic stroke and symptoms of right-sided plegia, aphasia, and gaze preference. NIH stroke scale measures 25. Patient was a candidate for thrombolysis and also mechanical thrombectomy and presented to interventional radiology for treatment. INDICATION: Acute ischemic stroke. ANESTHESIA/SEDATION: General anesthesia was utilized for the procedure. CONTRAST:  Approximately 50 cc Ominipque 300 MEDICATIONS: See MAR FLUOROSCOPY TIME:  Fluoroscopy Time: 12 minutes, (628 mGy). COMPLICATIONS: None immediate. BODY OF REPORT: Following a full explanation of the procedure along with the potential associated complications, an informed  witnessed consent was obtained. The patient was then placed under general anesthesia by the Department of Anesthesiology at [facility]. The right femoral access site was prepped and draped in the usual sterile fashion. Ultrasound was used to study the right common femoral artery which was patent. Using real-time ultrasound guidance, a 19 gauge introducer needle was used to access the right common femoral artery. Access was performed at 01:49. A hard copy image ultrasound the saved and stored in PACS. Using this  access, a 6 French sheath was placed in the descending thoracic aorta. Next, selective catheterization the left internal carotid artery was performed. 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 Pretreatment TICI score 0. Next, red 72 was advanced into the distal left ICA. The first pass was performed at 0203. This resulted in partial extraction of thrombus from the carotid terminus. Next, selective catheterization of the left M1 segment was performed and an EMBO trap was deployed within the left M1 segment and distal ICA. This resulted in partial extraction of the left M1 thrombus and the residual thrombus within the distal left ICA. This pass was performed at 02:15. Finally, a third pass was performed using a penumbra 43 catheter into a proximal left M2 branch occlusion which successfully yielded thrombus. This pass was performed at 02:25. There was no significant residual thromboembolic disease. Mild plaque was present within the distal left intracranial ICA. A mild stenosis is also present in the proximal left ICA origin which was estimated at less than 50% based on NASCET criteria. Post treatment TICI score 3. After reviewing the imaging, I elected to terminate the procedure at this point. Evaluation of the right femoral access site demonstrated that the site was suitable for a closure device. A 6 French Angio-Seal device was deployed without complication. Cone beam CT was then performed to evaluate for intracranial hemorrhage and treatment planning. This demonstrated minimal contrast staining. The patient was removed from anesthesia and transferred to recovery in stable condition. IMPRESSION: 1. Suction and stent retriever assisted thrombectomy of left carotid terminus and M1 occlusion. 2. Post-treatment TICI score = 3. PLAN: 1. To ICU for routine postoperative supportive care. Electronically Signed   By: Maude Naegeli M.D.   On:  02/21/2024 09:15   IR US  Guide Vasc Access Right Result Date: 02/21/2024 PROCEDURE PERFORMED: 1. Cerebral angiography with stroke thrombectomy 2. Ultrasound guided vascular access 3. Cone beam CT for treatment planning COMPARISON:  CT angiogram of the head and neck performed February 21, 2024 CLINICAL DATA:  63 year old male with acute ischemic stroke and symptoms of right-sided plegia, aphasia, and gaze preference. NIH stroke scale measures 25. Patient was a candidate for thrombolysis and also mechanical thrombectomy and presented to interventional radiology for treatment. INDICATION: Acute ischemic stroke. ANESTHESIA/SEDATION: General anesthesia was utilized for the procedure. CONTRAST:  Approximately 50 cc Ominipque 300 MEDICATIONS: See MAR FLUOROSCOPY TIME:  Fluoroscopy Time: 12 minutes, (628 mGy). COMPLICATIONS: None immediate. BODY OF REPORT: Following a full explanation of the procedure along with the potential associated complications, an informed witnessed consent was obtained. The patient was then placed under general anesthesia by the Department of Anesthesiology at [facility]. The right femoral access site was prepped and draped in the usual sterile fashion. Ultrasound was used to study the right common femoral artery which was patent. Using real-time ultrasound guidance, a 19 gauge introducer needle was used to access the right common femoral artery. Access was performed  at 01:49. A hard copy image ultrasound the saved and stored in PACS. Using this access, a 6 French sheath was placed in the descending thoracic aorta. Next, selective catheterization the left internal carotid artery was performed. 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 Pretreatment TICI score 0. Next, red 72 was advanced into the distal left ICA. The first pass was performed at 0203. This resulted in partial extraction of thrombus from the  carotid terminus. Next, selective catheterization of the left M1 segment was performed and an EMBO trap was deployed within the left M1 segment and distal ICA. This resulted in partial extraction of the left M1 thrombus and the residual thrombus within the distal left ICA. This pass was performed at 02:15. Finally, a third pass was performed using a penumbra 43 catheter into a proximal left M2 branch occlusion which successfully yielded thrombus. This pass was performed at 02:25. There was no significant residual thromboembolic disease. Mild plaque was present within the distal left intracranial ICA. A mild stenosis is also present in the proximal left ICA origin which was estimated at less than 50% based on NASCET criteria. Post treatment TICI score 3. After reviewing the imaging, I elected to terminate the procedure at this point. Evaluation of the right femoral access site demonstrated that the site was suitable for a closure device. A 6 French Angio-Seal device was deployed without complication. Cone beam CT was then performed to evaluate for intracranial hemorrhage and treatment planning. This demonstrated minimal contrast staining. The patient was removed from anesthesia and transferred to recovery in stable condition. IMPRESSION: 1. Suction and stent retriever assisted thrombectomy of left carotid terminus and M1 occlusion. 2. Post-treatment TICI score = 3. PLAN: 1. To ICU for routine postoperative supportive care. Electronically Signed   By: Maude Naegeli M.D.   On: 02/21/2024 09:15   IR CT Head Ltd Result Date: 02/21/2024 PROCEDURE PERFORMED: 1. Cerebral angiography with stroke thrombectomy 2. Ultrasound guided vascular access 3. Cone beam CT for treatment planning COMPARISON:  CT angiogram of the head and neck performed February 21, 2024 CLINICAL DATA:  63 year old male with acute ischemic stroke and symptoms of right-sided plegia, aphasia, and gaze preference. NIH stroke scale measures 25. Patient was a  candidate for thrombolysis and also mechanical thrombectomy and presented to interventional radiology for treatment. INDICATION: Acute ischemic stroke. ANESTHESIA/SEDATION: General anesthesia was utilized for the procedure. CONTRAST:  Approximately 50 cc Ominipque 300 MEDICATIONS: See MAR FLUOROSCOPY TIME:  Fluoroscopy Time: 12 minutes, (628 mGy). COMPLICATIONS: None immediate. BODY OF REPORT: Following a full explanation of the procedure along with the potential associated complications, an informed witnessed consent was obtained. The patient was then placed under general anesthesia by the Department of Anesthesiology at [facility]. The right femoral access site was prepped and draped in the usual sterile fashion. Ultrasound was used to study the right common femoral artery which was patent. Using real-time ultrasound guidance, a 19 gauge introducer needle was used to access the right common femoral artery. Access was performed at 01:49. A hard copy image ultrasound the saved and stored in PACS. Using this access, a 6 French sheath was placed in the descending thoracic aorta. Next, selective catheterization the left internal carotid artery was performed. 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 Pretreatment TICI score 0. Next, red 72 was advanced into the distal left ICA. The first  pass was performed at 0203. This resulted in partial extraction of thrombus from the carotid terminus. Next, selective catheterization of the left M1 segment was performed and an EMBO trap was deployed within the left M1 segment and distal ICA. This resulted in partial extraction of the left M1 thrombus and the residual thrombus within the distal left ICA. This pass was performed at 02:15. Finally, a third pass was performed using a penumbra 43 catheter into a proximal left M2 branch occlusion which successfully yielded thrombus. This pass was  performed at 02:25. There was no significant residual thromboembolic disease. Mild plaque was present within the distal left intracranial ICA. A mild stenosis is also present in the proximal left ICA origin which was estimated at less than 50% based on NASCET criteria. Post treatment TICI score 3. After reviewing the imaging, I elected to terminate the procedure at this point. Evaluation of the right femoral access site demonstrated that the site was suitable for a closure device. A 6 French Angio-Seal device was deployed without complication. [facility] beam CT was then performed to evaluate for intracranial hemorrhage and treatment planning. This demonstrated minimal contrast staining. The patient was removed from anesthesia and transferred to recovery in stable condition. IMPRESSION: 1. Suction and stent retriever assisted thrombectomy of left carotid terminus and M1 occlusion. 2. Post-treatment TICI score = 3. PLAN: 1. To ICU for routine postoperative supportive care. Electronically Signed   By: Maude Naegeli M.D.   On: 02/21/2024 09:15   CT C-SPINE NO CHARGE Result Date: 02/21/2024 EXAM: CT CERVICAL SPINE WITHOUT CONTRAST 02/21/2024 01:19:35 AM TECHNIQUE: CT of the cervical spine was performed without the administration of intravenous contrast. Multiplanar reformatted images are provided for review. Automated exposure control, iterative reconstruction, and/or weight based adjustment of the mA/kV was utilized to reduce the radiation dose to as low as reasonably achievable. COMPARISON: None available. CLINICAL HISTORY: Neck trauma (Age >= 65y); Neck trauma, intoxicated or obtunded (Age >= 16y). FINDINGS: BONES AND ALIGNMENT: No acute fracture or traumatic malalignment. DEGENERATIVE CHANGES: No significant degenerative changes. SOFT TISSUES: No prevertebral soft tissue swelling. IMPRESSION: 1. No acute abnormality of the cervical spine. Electronically signed by: Franky Stanford MD 02/21/2024 01:33 AM EDT RP Workstation:  [id number]   CT ANGIO HEAD NECK W WO CM (CODE STROKE) Result Date: 02/21/2024 EXAM: CTA HEAD AND NECK WITH AND WITHOUT 02/21/2024 01:18:14 AM TECHNIQUE: CTA of the head and neck was performed with and without the administration of 75 mL of intravenous iohexol  (OMNIPAQUE ) 350 MG/ML injection. Multiplanar 2D and/or 3D reformatted images are provided for review. Automated exposure control, iterative reconstruction, and/or weight based adjustment of the mA/kV was utilized to reduce the radiation dose to as low as reasonably achievable. Stenosis of the internal carotid arteries measured using NASCET criteria. COMPARISON: None available CLINICAL HISTORY: Neuro deficit, acute, stroke suspected. Stroke, Rt side weakness, hx strokes; Dr. Sallyann. FINDINGS: CTA NECK: AORTIC ARCH AND ARCH VESSELS: No dissection or arterial injury. No significant stenosis of the brachiocephalic or subclavian arteries. CERVICAL CAROTID ARTERIES: Progressive narrowing of the distal left internal carotid artery beginning at the petrous segment with occlusion at the distal cavernous segment. There is reconstitution of the supraclinoid portion with attenuated enhancement. The right internal carotid artery is patent without significant stenosis or dissection. No dissection or arterial injury is identified in the remaining cervical carotid arteries. CERVICAL VERTEBRAL ARTERIES: No dissection, arterial injury, or significant stenosis. LUNGS AND MEDIASTINUM: Biapical Emphysema. SOFT TISSUES: No acute abnormality. BONES: No acute abnormality. CTA  HEAD: ANTERIOR CIRCULATION: The left internal carotid artery demonstrates progressive narrowing beginning at the petrous segment with occlusion at the distal cavernous segment. There is reconstitution of the supraclinoid portion with attenuated enhancement. The left MCA is occluded at its origin with poor distal collateralization. Moderate stenosis of the right MCA M1 segment. No significant stenosis of the  anterior cerebral arteries. No aneurysm. POSTERIOR CIRCULATION: Mild stenosis of the right PCA p2 segment. Mild atherosclerotic irregularity of the left PCA without high-grade stenosis. No significant stenosis of the basilar artery. No significant stenosis of the vertebral arteries. No aneurysm. OTHER: No dural venous sinus thrombosis on this non-dedicated study. IMPRESSION: 1. Emergent large vessel occlusion of the left MCA at its origin with poor distal collateralization. 2. Progressive narrowing of the distal left internal carotid artery beginning at the petrous segment with occlusion at the distal cavernous segment and reconstitution of the supraclinoid portion with attenuated enhancement. 3. Moderate stenosis of the right MCA M1 segment. 4. Mild stenosis of the right PCA P2 segment. 5. Mild atherosclerotic irregularity of the left PCA without high-grade stenosis. Critical findings communicated to Dr. MAGDALENO Blower via telephone at 1:28 AM on 02/21/24. Electronically signed by: Franky Stanford MD 02/21/2024 01:32 AM EDT RP Workstation: [id number]   CT HEAD CODE STROKE WO CONTRAST Result Date: 02/21/2024 EXAM: CT HEAD WITHOUT CONTRAST 02/21/2024 12:40:34 AM TECHNIQUE: CT of the head was performed without the administration of intravenous contrast. Automated exposure control, iterative reconstruction, and/or weight based adjustment of the mA/kV was utilized to reduce the radiation dose to as low as reasonably achievable. COMPARISON: 02/26/2019 CLINICAL HISTORY: Neuro deficit, acute, stroke suspected. Stroke, Rt side weakness. FINDINGS: BRAIN AND VENTRICLES: No acute hemorrhage. There are old infarcts of the right MCA and PCA territory. There is a focal area of loss of gray-white differentiation in the posterior left MCA territory. ASPECTS is 8. Hyperdense left MCA. No hydrocephalus, extra-axial collection, mass effect, or midline shift. ASPECTS ([place] stroke program early CT score) - ganglionic level infarction  (caudate, lentiform nuclei, internal capsule, insula, M1-m3 cortex): 6 - supraganglionic infarction (m4-m6 cortex): 2 Total score (0-10 with 10 being normal): 8 ORBITS: No acute abnormality. SINUSES: No acute abnormality. SOFT TISSUES AND SKULL: No acute soft tissue abnormality. No skull fracture. IMPRESSION: 1. Acute/early subacute left MCA territory infarct with focal loss of gray-white differentiation (ASPECTS 8) and hyperdense left MCA. 2. No intracranial hemorrhage. 3. 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 Findings communicated to Dr. MAGDALENO Blower via [facility] text page at 12:50 AM on 02/21/24. Electronically signed by: Franky Stanford MD 02/21/2024 12:52 AM EDT RP Workstation: [id number]    Labs:  Basic Metabolic Panel: Recent Labs  Lab 03/08/24 0628 03/11/24 0449  NA 138 138  K 3.6 4.1  CL 103 107  CO2 26 22  GLUCOSE 107* 96  BUN 9 11  CREATININE 1.02 1.01  CALCIUM  9.0 8.8*    CBC: Recent Labs  Lab 03/08/24 0628 03/11/24 0449  WBC 4.3 4.8  HGB 14.9 14.4  HCT 44.2 43.0  MCV 93.8 95.8  PLT 266 275    CBG: No results for input(s): GLUCAP in the last 168 hours.  Brief HPI:  Frank Fischer is a 63 y.o. 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 Subsequently treated with TNK and  thrombectomy was transition to progressive care. Hospitalization complicated by severe alcohol withdrawal, initially treated with Librium followed by phenobarbital, now slowly improving on phenobarbital taper. He was discharged to CIR yesterday, today when physical therapy tried to get him up he became unresponsive, systolic blood pressure dropped to the 50s/60s range, improved to low 100 after he was put in Trendelenburg position, slowly improved,  mental status improving however does have dysarthria from his current stroke.    Inpatient Rehabilitation Course: Frank Fischer was admitted to rehab 02/27/2024 for inpatient therapies to consist of PT, ST and OT at least three hours five days a week. Past admission physiatrist, therapy team and rehab RN have worked together to provide customized collaborative inpatient rehab.  Patient was initially admitted to CIR on 02/27/2024 for left gaze, right hemiplegia, global aphasia/dysarthria. Subsequently treated with TNK and thrombectomy was transition to progressive care. During therapy, patient became unresponsive with drop in systolic blood pressures 50/60s rang. Rapid response was called to evaluate, he received IVF and bp slowly improved along with mental status, however dysarthria continued. Internal medicine consulted for evaluation and agreed to transfer patient back to inpatient for management.     Rehab course: During patient's stay in rehab weekly team conferences were held to monitor patient's progress, set goals and discuss barriers to discharge.  Rehab course interrupted due to change in patient's mental status quiring transfer to acute medicine.  Disposition: Discharge disposition: 02-Transferred to [facility]        Diet: dys 3  Special Instructions:  -No driving or operating heavy machinery until cleared by provider  -No smoking or alcohol or illicit drug use     Allergies as of 02/28/2024       Reactions   Penicillins Itching        Medication List     ASK your doctor about these medications    albuterol  108 (90 Base) MCG/ACT inhaler Commonly known as: VENTOLIN  HFA Inhale 2 puffs into the lungs every 6 (six) hours as needed for wheezing or shortness of breath.   aspirin  EC 81 MG tablet Take 81 mg by mouth daily.   atorvastatin  80 MG tablet Commonly known as: LIPITOR  Take 1 tablet (80 mg total) by mouth daily.   ezetimibe  10 MG  tablet Commonly known as: Zetia  Take 1 tablet (10 mg total) by mouth daily.   feeding supplement Liqd Take 237 mLs by mouth 2 (two) times daily between meals.   folic acid  1 MG tablet Commonly known as: FOLVITE  Take 1 tablet (1 mg total) by mouth daily.   multivitamin with minerals Tabs tablet Take 1 tablet by mouth daily.   nicotine 7 mg/24hr patch Commonly known as: NICODERM CQ - dosed in mg/24 hr Place 1 patch (7 mg total) onto the skin daily.   tamsulosin  0.4 MG Caps capsule Commonly known as: FLOMAX  Take 1 capsule (0.4 mg total) by mouth daily.   thiamine  100 MG tablet Commonly known as: Vitamin B-1 Take 1 tablet (100 mg total) by mouth daily.         Signed: Daphne LOISE Satterfield 03/11/2024, 12:31 PM

## 2024-03-11 NOTE — Progress Notes (Signed)
 Physical Therapy Discharge Summary  Patient Details  Name: Frank Fischer MRN: [id number] Date of Birth: 11-05-1960  Date of Discharge from PT service:March 11, 2024  Patient has met {NUMBERS 0-12:18577} of {NUMBERS 0-12:18577} long term goals due to {due un:6958322}.  Patient to discharge at [facility] level {LOA:3049010}.   Patient's care partner is independent to provide the necessary physical assistance at discharge.  Reasons goals not met: ***  Recommendation:  Patient will benefit from ongoing skilled PT services in home health setting to continue to advance safe functional mobility, address ongoing impairments in ***, and minimize fall risk.  Equipment: {equipment:3041657}  Reasons for discharge: {Reason for discharge:3049018}  Patient/family agrees with progress made and goals achieved: {Pt/Family agree with progress/goals:3049020}  PT Discharge Precautions/Restrictions Precautions Precautions: Fall Precaution/Restrictions Comments: R inattention Restrictions Weight Bearing Restrictions Per Provider Order: No Pain Pain Assessment Pain Scale: 0-10 Pain Score: 0-No pain Pain Interference Pain Interference Pain Effect on Sleep: 1. Rarely or not at all Pain Interference with Therapy Activities: 1. Rarely or not at all Pain Interference with Day-to-Day Activities: 1. Rarely or not at all Vision/Perception    *** Cognition   *** Sensation  *** Motor  Motor Motor - Discharge Observations: R hemiparesis, R inattention, impaired motor planning/sequencing, decreased balance/coordination, and poor awareness that has slightly improved since evaluation. Difficult to accurately assess due to pt's either decreased motor planning or decreased participation in following VC  Mobility Bed Mobility Bed Mobility: Supine to Sit;Sit to Supine;Rolling Left Rolling Left: Supervision/Verbal cueing Supine to Sit: Supervision/Verbal cueing Sit to Supine: Supervision/Verbal  cueing Transfers Transfers: Sit to Stand;Stand to Sit;Stand Pivot Transfers Sit to Stand: Contact Guard/Touching assist Stand to Sit: Contact Guard/Touching assist Stand Pivot Transfers: Minimal Assistance - Patient > 75% Stand Pivot Transfer Details: Verbal cues for precautions/safety Transfer (Assistive device): None Locomotion  Gait Ambulation: Yes Gait Assistance: Minimal Assistance - Patient > 75% Assistive device: None Gait Assistance Details: Verbal cues for safe use of DME/AE;Verbal cues for gait pattern;Verbal cues for technique Gait Gait: Yes Gait Pattern: Impaired Gait Pattern: Step-to pattern;Poor foot clearance - right;Poor foot clearance - left;Narrow base of support;Shuffle;Decreased hip/knee flexion - right;Decreased hip/knee flexion - left;Decreased weight shift to left Gait velocity: decreased Stairs / Additional Locomotion Stairs: Yes Stairs Assistance: Moderate Assistance - Patient 50 - 74% Stair Management Technique: Two rails;Alternating pattern Height of Stairs: 6 Wheelchair Mobility Wheelchair Mobility: Yes Wheelchair Assistance: Moderate Assistance - Patient 50 - 74% Wheelchair Propulsion: Both lower extermities;Left upper extremity Wheelchair Parts Management: Needs assistance  Trunk/Postural Assessment  Cervical Assessment Cervical Assessment: Within Functional Limits Thoracic Assessment Thoracic Assessment: Within Functional Limits Lumbar Assessment Lumbar Assessment: Within Functional Limits Postural Control Postural Control: Deficits on evaluation Righting Reactions: delayed Protective Responses: delayed  Balance  *** Extremity Assessment  RLE Assessment RLE Assessment: Exceptions to [facility] General Strength Comments: not formally tested; grossly 3-/5 LLE Assessment LLE Assessment: Exceptions to [facility] General Strength Comments: not formally tested; grossly 4/5   Dominic Sandoval PTA   03/11/2024, 12:19 PM

## 2024-03-12 ENCOUNTER — Other Ambulatory Visit (HOSPITAL_COMMUNITY): Payer: Self-pay

## 2024-03-12 MED ORDER — LORAZEPAM 0.5 MG PO TABS
1.0000 mg | ORAL_TABLET | Freq: Once | ORAL | Status: AC
  Administered 2024-03-12: 1 mg via ORAL
  Filled 2024-03-12: qty 2

## 2024-03-12 MED ORDER — CLOPIDOGREL BISULFATE 75 MG PO TABS
75.0000 mg | ORAL_TABLET | Freq: Every day | ORAL | 0 refills | Status: DC
  Filled 2024-03-12: qty 30, 30d supply, fill #0

## 2024-03-12 MED ORDER — ASPIRIN 81 MG PO TBEC
81.0000 mg | DELAYED_RELEASE_TABLET | Freq: Every day | ORAL | 12 refills | Status: DC
  Filled 2024-03-12: qty 30, 30d supply, fill #0

## 2024-03-12 MED ORDER — LISINOPRIL 5 MG PO TABS
5.0000 mg | ORAL_TABLET | Freq: Every day | ORAL | 0 refills | Status: DC
  Filled 2024-03-12: qty 30, 30d supply, fill #0

## 2024-03-12 MED ORDER — THIAMINE HCL 100 MG PO TABS
100.0000 mg | ORAL_TABLET | Freq: Every day | ORAL | 0 refills | Status: DC
  Filled 2024-03-12: qty 30, 30d supply, fill #0

## 2024-03-12 MED ORDER — LORAZEPAM 1 MG PO TABS
1.0000 mg | ORAL_TABLET | Freq: Two times a day (BID) | ORAL | 0 refills | Status: DC
  Filled 2024-03-12: qty 20, 10d supply, fill #0

## 2024-03-12 MED ORDER — NICOTINE 14 MG/24HR TD PT24
14.0000 mg | MEDICATED_PATCH | Freq: Every day | TRANSDERMAL | 0 refills | Status: DC
  Filled 2024-03-12: qty 28, 28d supply, fill #0

## 2024-03-12 MED ORDER — FOLIC ACID 1 MG PO TABS
1.0000 mg | ORAL_TABLET | Freq: Every day | ORAL | 0 refills | Status: DC
  Filled 2024-03-12: qty 30, 30d supply, fill #0

## 2024-03-12 MED ORDER — ACETAMINOPHEN 325 MG PO TABS: 325.0000 mg | ORAL_TABLET | ORAL | Status: AC | PRN

## 2024-03-12 MED ORDER — LORAZEPAM 1 MG PO TABS
1.0000 mg | ORAL_TABLET | Freq: Two times a day (BID) | ORAL | 0 refills | Status: AC
  Filled 2024-03-12: qty 60, 30d supply, fill #0

## 2024-03-12 MED ORDER — ADULT MULTIVITAMIN W/MINERALS CH
1.0000 | ORAL_TABLET | Freq: Every day | ORAL | 0 refills | Status: AC
  Filled 2024-03-12: qty 100, 100d supply, fill #0

## 2024-03-12 NOTE — Discharge Summary (Signed)
 Physician Discharge Summary  Patient ID: Frank Fischer MRN: [id number] DOB/AGE: [birth year] 63 y.o.  Admit date: 03/01/2024 Discharge date: 03/12/2024  Discharge Diagnoses:  Active Problems:   Essential hypertension, benign   Pain in joint, shoulder region   Smoker   Polysubstance abuse (HCC)   Ischemic stroke (HCC)   Cognitive and neurobehavioral dysfunction   Dyslipidemia   Hypertensive heart disease without congestive heart failure   Mild persistent asthma without complication   Mixed hyperlipidemia   Rheumatoid arthritis (HCC)   Vitamin D deficiency   Discharged Condition: stable  Significant Diagnostic Studies: EEG adult Result Date: 02/28/2024 Michaela Aisha SQUIBB, MD     02/28/2024  6:36 PM History: 63 yo M being evaluated for loss of conciousness with previous strokes, evaluating for seizure predisposition EEG Duration: 30 minutes Sedation: none Patient State: Awake and drowsy Technique: This EEG was acquired with electrodes placed according to the International 10-20 electrode system (including Fp1, Fp2, F3, F4, C3, C4, P3, P4, O1, O2, T3, T4, T5, T6, A1, A2, Fz, Cz, Pz). The following electrodes were missing or displaced: none. Background: The background consists of intermixed alpha and frontally predominant beta activities.  The posterior dominant rhythm is low voltage, but is seen with a frequency of 9 Hz.  There is anterior shifting of the PDR with drowsiness, but sleep is not recorded. Photic stimulation: Physiologic driving is not performed EEG Abnormalities: 1) low voltage PDR Clinical Interpretation: This EEG revealed evidence of bilateral posterior quadrant dysfunction, consistent with the patient's known strokes. There was no seizure or seizure predisposition recorded on this study. Please note that lack of epileptiform activity on EEG does not preclude the possibility of epilepsy. Aisha Michaela, MD [facility] If 7pm- 7am, please page neurology on  call as listed in AMION.  CT HEAD WO CONTRAST ( ) Result Date: 02/28/2024 EXAM: CT HEAD WITHOUT CONTRAST 02/28/2024 02:53:24 PM TECHNIQUE: CT of the head was performed without the administration of intravenous contrast. Automated exposure control, iterative reconstruction, and/or weight based adjustment of the mA/kV was utilized to reduce the radiation dose to as low as reasonably achievable. COMPARISON: MRI head without contrast 02/22/2024. CLINICAL HISTORY: Stroke, follow up. FINDINGS: BRAIN AND VENTRICLES: Expected evolution of the large left MCA territory infarct is noted. No acute hemorrhage is present. Remote infarcts involving the posterior right frontal lobe, right parietal lobe, and right occipital lobe are again noted. Remote lacunar infarcts of the right basal ganglia are stable. Atherosclerotic calcifications are present in the cavernous carotid arteries bilaterally. No hyperdense vessel is present. No evidence of acute infarct. No hydrocephalus. No extra-axial collection. No mass effect or midline shift. ORBITS: No acute abnormality. SINUSES: No acute abnormality. SOFT TISSUES AND SKULL: No acute soft tissue abnormality. No skull fracture. IMPRESSION: 1. No acute intracranial abnormality. 2. Expected evolution of the large left MCA territory infarct. 3. Remote infarcts involving the posterior right frontal lobe, right parietal lobe, and right occipital lobe. 4. Stable remote lacunar infarcts of the right basal ganglia. 5. Atherosclerotic calcifications in the cavernous carotid arteries bilaterally. No hyperdense vessel. Electronically signed by: Lonni Necessary MD 02/28/2024 04:00 PM EDT RP Workstation: [id number]   MR BRAIN WO CONTRAST Result Date: 02/22/2024 EXAM: MRI BRAIN WITHOUT CONTRAST MRA HEAD WITHOUT CONTRAST 02/22/2024 12:35:18 AM TECHNIQUE: Multiplanar multisequence MRI of the brain was performed without the administration of intravenous contrast. MRA of the head was performed  without contrast using time-of-flight technique. 3D postprocessing with multiplanar reformations and MIPs was performed for better evaluation  of the vasculature. COMPARISON: 02/26/2019 CLINICAL HISTORY: Stroke, follow up. FINDINGS: MRI BRAIN: BRAIN AND VENTRICLES: Large early subacute infarct of the left MCA territory predominantly affecting the posterior region. Small amount of petechial hemorrhage in the infarcted region. Multifocal hyperintense T2-weighted signal within the cerebral white matter, most commonly due to chronic small vessel disease. Multiple old infarcts of the right MCA and PCA territories. Abnormal left ICA flow void. No mass or abnormal enhancement. No midline shift. No hydrocephalus. The sella is unremarkable. Normal flow voids. ORBITS: No acute abnormality. SINUSES AND MASTOIDS: No acute abnormality. BONES AND SOFT TISSUES: Normal bone marrow signal. No acute soft tissue abnormality. MRA HEAD: ANTERIOR CIRCULATION: There is no flow related enhancement within the left ICA on the time of flight angiographic images. No significant stenosis of the right internal carotid artery. There is normal flow related enhancement throughout the left MCA. No significant stenosis of the right middle cerebral artery. No significant stenosis of the anterior cerebral arteries. No aneurysm. POSTERIOR CIRCULATION: Unchanged severe stenosis of the right PCA P2 segment. No significant stenosis of the left posterior cerebral artery. No significant stenosis of the basilar artery. No significant stenosis of the vertebral arteries. No aneurysm. IMPRESSION: 1. Large early subacute left MCA territory infarct, predominantly posterior, with small petechial hemorrhage (Heidelberg class 1). 2. Left ICA occlusion.  Left MCA is now patent. 3. Multiple chronic infarcts in the right MCA and PCA territories. 4. 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 Electronically signed by: Franky Stanford MD 02/22/2024 12:52 AM EDT RP  Workstation: [id number]   MR ANGIO HEAD WO CONTRAST Result Date: 02/22/2024 EXAM: MRI BRAIN WITHOUT CONTRAST MRA HEAD WITHOUT CONTRAST 02/22/2024 12:35:18 AM TECHNIQUE: Multiplanar multisequence MRI of the brain was performed without the administration of intravenous contrast. MRA of the head was performed without contrast using time-of-flight technique. 3D postprocessing with multiplanar reformations and MIPs was performed for better evaluation of the vasculature. COMPARISON: 02/26/2019 CLINICAL HISTORY: Stroke, follow up. FINDINGS: MRI BRAIN: BRAIN AND VENTRICLES: Large early subacute infarct of the left MCA territory predominantly affecting the posterior region. Small amount of petechial hemorrhage in the infarcted region. Multifocal hyperintense T2-weighted signal within the cerebral white matter, most commonly due to chronic small vessel disease. Multiple old infarcts of the right MCA and PCA territories. Abnormal left ICA flow void. No mass or abnormal enhancement. No midline shift. No hydrocephalus. The sella is unremarkable. Normal flow voids. ORBITS: No acute abnormality. SINUSES AND MASTOIDS: No acute abnormality. BONES AND SOFT TISSUES: Normal bone marrow signal. No acute soft tissue abnormality. MRA HEAD: ANTERIOR CIRCULATION: There is no flow related enhancement within the left ICA on the time of flight angiographic images. No significant stenosis of the right internal carotid artery. There is normal flow related enhancement throughout the left MCA. No significant stenosis of the right middle cerebral artery. No significant stenosis of the anterior cerebral arteries. No aneurysm. POSTERIOR CIRCULATION: Unchanged severe stenosis of the right PCA P2 segment. No significant stenosis of the left posterior cerebral artery. No significant stenosis of the basilar artery. No significant stenosis of the vertebral arteries. No aneurysm. IMPRESSION: 1. Large early subacute left MCA territory infarct,  predominantly posterior, with small petechial hemorrhage (Heidelberg class 1). 2. Left ICA occlusion.  Left MCA is now patent. 3. Multiple chronic infarcts in the right MCA and PCA territories. 4. 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 Electronically signed by: Franky Stanford MD 02/22/2024 12:52 AM EDT RP Workstation: [id number]  ECHOCARDIOGRAM COMPLETE Result Date: 02/21/2024    ECHOCARDIOGRAM REPORT   Patient Name:   Frank Fischer Date of Exam: 02/21/2024 Medical Rec #:  [id number]      Height:       70.0 in Accession #:    [id number]     Weight:       205.0 lb Date of Birth:  01/15/1961     BSA:          2.109 m Patient Age:    62 years       BP:           150/74 mmHg Patient Gender: M              HR:           50 bpm. Exam Location:  Inpatient Procedure: 2D Echo, 3D Echo, Color Doppler and Cardiac Doppler (Both Spectral            and Color Flow Doppler were utilized during procedure). Indications:    Stroke I63.9  History:        Patient has prior history of Echocardiogram examinations, most                 recent 02/26/2019. Stroke, Arrythmias:Bradycardia; Risk                 Factors:Hypertension, Current Smoker and PSA.  Sonographer:    Koleen Popper RDCS Referring Phys: [id number] NORMIE M BUI IMPRESSIONS  1. Left ventricular ejection fraction, by estimation, is 60 to 65%. Left ventricular ejection fraction by 3D volume is 52 %. The left ventricle has normal function. The left ventricle has no regional wall motion abnormalities. Left ventricular diastolic  parameters were normal.  2. Right ventricular systolic function is normal. The right ventricular size is normal. Tricuspid regurgitation signal is inadequate for assessing PA pressure.  3. The mitral valve is normal in structure. No evidence of mitral valve regurgitation. No evidence of mitral stenosis.  4. The aortic valve was not well visualized. Aortic valve regurgitation is not visualized. No aortic stenosis is present.  5. The inferior  vena cava is normal in size with greater than 50% respiratory variability, suggesting right atrial pressure of 3 mmHg.  6. Increased flow velocities may be secondary to anemia, thyrotoxicosis, hyperdynamic or high flow state. Comparison(s): No significant change from prior study. FINDINGS  Left Ventricle: Left ventricular ejection fraction, by estimation, is 60 to 65%. Left ventricular ejection fraction by 3D volume is 52 %. The left ventricle has normal function. The left ventricle has no regional wall motion abnormalities. Strain was performed and the global longitudinal strain is indeterminate. The left ventricular internal cavity size was normal in size. There is no left ventricular hypertrophy. Left ventricular diastolic parameters were normal. Right Ventricle: The right ventricular size is normal. No increase in right ventricular wall thickness. Right ventricular systolic function is normal. Tricuspid regurgitation signal is inadequate for assessing PA pressure. Left Atrium: Left atrial size was normal in size. Right Atrium: Right atrial size was normal in size. Pericardium: There is no evidence of pericardial effusion. Mitral Valve: The mitral valve is normal in structure. No evidence of mitral valve regurgitation. No evidence of mitral valve stenosis. Tricuspid Valve: The tricuspid valve is normal in structure. Tricuspid valve regurgitation is not demonstrated. No evidence of tricuspid stenosis. Aortic Valve: The aortic valve was not well visualized. Aortic valve regurgitation is not visualized. No aortic stenosis is present. Aortic valve mean gradient measures 6.0  mmHg. Aortic valve peak gradient measures 13.5 mmHg. Aortic valve area, by VTI measures 2.53 cm. Pulmonic Valve: The pulmonic valve was normal in structure. Pulmonic valve regurgitation is not visualized. No evidence of pulmonic stenosis. Aorta: The aortic root is normal in size and structure. Venous: The inferior vena cava is normal in size with  greater than 50% respiratory variability, suggesting right atrial pressure of 3 mmHg. IAS/Shunts: No atrial level shunt detected by color flow Doppler. Additional Comments: 3D was performed not requiring image post processing on an independent workstation and was indeterminate.  LEFT VENTRICLE PLAX 2D LVIDd:         4.50 cm         Diastology LVIDs:         3.24 cm         LV e' medial:    11.60 cm/s LV PW:         0.85 cm         LV E/e' medial:  8.8 LV IVS:        1.09 cm         LV e' lateral:   13.50 cm/s LVOT diam:     1.81 cm         LV E/e' lateral: 7.6 LV SV:         80 LV SV Index:   38 LVOT Area:     2.57 cm        3D Volume EF                                LV 3D EF:    Left                                             ventricul LV Volumes (MOD)                            ar LV vol d, MOD    132.0 ml                   ejection A4C:                                        fraction LV vol s, MOD    51.6 ml                    by 3D A4C:                                        volume is LV SV MOD A4C:   132.0 ml                   52 %.                                 3D Volume EF:  3D EF:        52 %                                LV EDV:       144 ml                                LV ESV:       69 ml                                LV SV:        75 ml RIGHT VENTRICLE             IVC RV Basal diam:  3.98 cm     IVC diam: 1.92 cm RV S prime:     18.20 cm/s TAPSE (M-mode): 2.4 cm LEFT ATRIUM             Index        RIGHT ATRIUM           Index LA diam:        2.86 cm 1.36 cm/m   RA Area:     12.60 cm LA Vol (A2C):   55.2 ml 26.17 ml/m  RA Volume:   26.90 ml  12.75 ml/m LA Vol (A4C):   26.8 ml 12.70 ml/m LA Biplane Vol: 37.8 ml 17.92 ml/m  AORTIC VALVE AV Area (Vmax):    2.28 cm AV Area (Vmean):   2.22 cm AV Area (VTI):     2.53 cm AV Vmax:           184.00 cm/s AV Vmean:          102.000 cm/s AV VTI:            0.317 m AV Peak Grad:      13.5 mmHg AV Mean Grad:      6.0 mmHg  LVOT Vmax:         163.00 cm/s LVOT Vmean:        88.100 cm/s LVOT VTI:          0.312 m LVOT/AV VTI ratio: 0.98  AORTA Ao Root diam: 2.37 cm MITRAL VALVE MV Area (PHT): 3.42 cm     SHUNTS MV Decel Time: 222 msec     Systemic VTI:  0.31 m MV E velocity: 102.00 cm/s  Systemic Diam: 1.81 cm MV A velocity: 66.60 cm/s MV E/A ratio:  1.53 Vishnu Priya Mallipeddi Electronically signed by Diannah Late Mallipeddi Signature Date/Time: 02/21/2024/4:42:33 PM    Final    IR PERCUTANEOUS ART THROMBECTOMY/INFUSION INTRACRANIAL INC DIAG ANGIO Result Date: 02/21/2024 PROCEDURE PERFORMED: 1. Cerebral angiography with stroke thrombectomy 2. Ultrasound guided vascular access 3. Cone beam CT for treatment planning COMPARISON:  CT angiogram of the head and neck performed February 21, 2024 CLINICAL DATA:  63 year old male with acute ischemic stroke and symptoms of right-sided plegia, aphasia, and gaze preference. NIH stroke scale measures 25. Patient was a candidate for thrombolysis and also mechanical thrombectomy and presented to interventional radiology for treatment. INDICATION: Acute ischemic stroke. ANESTHESIA/SEDATION: General anesthesia was utilized for the procedure. CONTRAST:  Approximately 50 cc Ominipque 300 MEDICATIONS: See MAR FLUOROSCOPY TIME:  Fluoroscopy Time: 12 minutes, (628 mGy). COMPLICATIONS: None immediate. BODY OF REPORT: Following a full explanation of the  procedure along with the potential associated complications, an informed witnessed consent was obtained. The patient was then placed under general anesthesia by the Department of Anesthesiology at [facility]. The right femoral access site was prepped and draped in the usual sterile fashion. Ultrasound was used to study the right common femoral artery which was patent. Using real-time ultrasound guidance, a 19 gauge introducer needle was used to access the right common femoral artery. Access was performed at 01:49. A hard copy image ultrasound the  saved and stored in PACS. Using this access, a 6 French sheath was placed in the descending thoracic aorta. Next, selective catheterization the left internal carotid artery was performed. 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 Pretreatment TICI score 0. Next, red 72 was advanced into the distal left ICA. The first pass was performed at 0203. This resulted in partial extraction of thrombus from the carotid terminus. Next, selective catheterization of the left M1 segment was performed and an EMBO trap was deployed within the left M1 segment and distal ICA. This resulted in partial extraction of the left M1 thrombus and the residual thrombus within the distal left ICA. This pass was performed at 02:15. Finally, a third pass was performed using a penumbra 43 catheter into a proximal left M2 branch occlusion which successfully yielded thrombus. This pass was performed at 02:25. There was no significant residual thromboembolic disease. Mild plaque was present within the distal left intracranial ICA. A mild stenosis is also present in the proximal left ICA origin which was estimated at less than 50% based on NASCET criteria. Post treatment TICI score 3. After reviewing the imaging, I elected to terminate the procedure at this point. Evaluation of the right femoral access site demonstrated that the site was suitable for a closure device. A 6 French Angio-Seal device was deployed without complication. Cone beam CT was then performed to evaluate for intracranial hemorrhage and treatment planning. This demonstrated minimal contrast staining. The patient was removed from anesthesia and transferred to recovery in stable condition. IMPRESSION: 1. Suction and stent retriever assisted thrombectomy of left carotid terminus and M1 occlusion. 2. Post-treatment TICI score = 3. PLAN: 1. To ICU for routine postoperative supportive care. Electronically  Signed   By: Maude Naegeli M.D.   On: 02/21/2024 09:15   IR US  Guide Vasc Access Right Result Date: 02/21/2024 PROCEDURE PERFORMED: 1. Cerebral angiography with stroke thrombectomy 2. Ultrasound guided vascular access 3. Cone beam CT for treatment planning COMPARISON:  CT angiogram of the head and neck performed February 21, 2024 CLINICAL DATA:  63 year old male with acute ischemic stroke and symptoms of right-sided plegia, aphasia, and gaze preference. NIH stroke scale measures 25. Patient was a candidate for thrombolysis and also mechanical thrombectomy and presented to interventional radiology for treatment. INDICATION: Acute ischemic stroke. ANESTHESIA/SEDATION: General anesthesia was utilized for the procedure. CONTRAST:  Approximately 50 cc Ominipque 300 MEDICATIONS: See MAR FLUOROSCOPY TIME:  Fluoroscopy Time: 12 minutes, (628 mGy). COMPLICATIONS: None immediate. BODY OF REPORT: Following a full explanation of the procedure along with the potential associated complications, an informed witnessed consent was obtained. The patient was then placed under general anesthesia by the Department of Anesthesiology at [facility]. The right femoral access site was prepped and draped in the usual sterile fashion. Ultrasound was used to study the right common femoral artery which was patent. Using real-time ultrasound guidance, a 19 gauge introducer needle was used to  access the right common femoral artery. Access was performed at 01:49. A hard copy image ultrasound the saved and stored in PACS. Using this access, a 6 French sheath was placed in the descending thoracic aorta. Next, selective catheterization the left internal carotid artery was performed. 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 Pretreatment TICI score 0. Next, red 72 was advanced into the distal left ICA. The first pass was performed at 0203. This resulted in  partial extraction of thrombus from the carotid terminus. Next, selective catheterization of the left M1 segment was performed and an EMBO trap was deployed within the left M1 segment and distal ICA. This resulted in partial extraction of the left M1 thrombus and the residual thrombus within the distal left ICA. This pass was performed at 02:15. Finally, a third pass was performed using a penumbra 43 catheter into a proximal left M2 branch occlusion which successfully yielded thrombus. This pass was performed at 02:25. There was no significant residual thromboembolic disease. Mild plaque was present within the distal left intracranial ICA. A mild stenosis is also present in the proximal left ICA origin which was estimated at less than 50% based on NASCET criteria. Post treatment TICI score 3. After reviewing the imaging, I elected to terminate the procedure at this point. Evaluation of the right femoral access site demonstrated that the site was suitable for a closure device. A 6 French Angio-Seal device was deployed without complication. Cone beam CT was then performed to evaluate for intracranial hemorrhage and treatment planning. This demonstrated minimal contrast staining. The patient was removed from anesthesia and transferred to recovery in stable condition. IMPRESSION: 1. Suction and stent retriever assisted thrombectomy of left carotid terminus and M1 occlusion. 2. Post-treatment TICI score = 3. PLAN: 1. To ICU for routine postoperative supportive care. Electronically Signed   By: Maude Naegeli M.D.   On: 02/21/2024 09:15   IR CT Head Ltd Result Date: 02/21/2024 PROCEDURE PERFORMED: 1. Cerebral angiography with stroke thrombectomy 2. Ultrasound guided vascular access 3. Cone beam CT for treatment planning COMPARISON:  CT angiogram of the head and neck performed February 21, 2024 CLINICAL DATA:  63 year old male with acute ischemic stroke and symptoms of right-sided plegia, aphasia, and gaze preference. NIH  stroke scale measures 25. Patient was a candidate for thrombolysis and also mechanical thrombectomy and presented to interventional radiology for treatment. INDICATION: Acute ischemic stroke. ANESTHESIA/SEDATION: General anesthesia was utilized for the procedure. CONTRAST:  Approximately 50 cc Ominipque 300 MEDICATIONS: See MAR FLUOROSCOPY TIME:  Fluoroscopy Time: 12 minutes, (628 mGy). COMPLICATIONS: None immediate. BODY OF REPORT: Following a full explanation of the procedure along with the potential associated complications, an informed witnessed consent was obtained. The patient was then placed under general anesthesia by the Department of Anesthesiology at [facility]. The right femoral access site was prepped and draped in the usual sterile fashion. Ultrasound was used to study the right common femoral artery which was patent. Using real-time ultrasound guidance, a 19 gauge introducer needle was used to access the right common femoral artery. Access was performed at 01:49. A hard copy image ultrasound the saved and stored in PACS. Using this access, a 6 French sheath was placed in the descending thoracic aorta. Next, selective catheterization the left internal carotid artery was performed. 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 Pretreatment TICI score 0. Next, red 72  was advanced into the distal left ICA. The first pass was performed at 0203. This resulted in partial extraction of thrombus from the carotid terminus. Next, selective catheterization of the left M1 segment was performed and an EMBO trap was deployed within the left M1 segment and distal ICA. This resulted in partial extraction of the left M1 thrombus and the residual thrombus within the distal left ICA. This pass was performed at 02:15. Finally, a third pass was performed using a penumbra 43 catheter into a proximal left M2 branch occlusion which  successfully yielded thrombus. This pass was performed at 02:25. There was no significant residual thromboembolic disease. Mild plaque was present within the distal left intracranial ICA. A mild stenosis is also present in the proximal left ICA origin which was estimated at less than 50% based on NASCET criteria. Post treatment TICI score 3. After reviewing the imaging, I elected to terminate the procedure at this point. Evaluation of the right femoral access site demonstrated that the site was suitable for a closure device. A 6 French Angio-Seal device was deployed without complication. Cone beam CT was then performed to evaluate for intracranial hemorrhage and treatment planning. This demonstrated minimal contrast staining. The patient was removed from anesthesia and transferred to recovery in stable condition. IMPRESSION: 1. Suction and stent retriever assisted thrombectomy of left carotid terminus and M1 occlusion. 2. Post-treatment TICI score = 3. PLAN: 1. To ICU for routine postoperative supportive care. Electronically Signed   By: Maude Naegeli M.D.   On: 02/21/2024 09:15   CT C-SPINE NO CHARGE Result Date: 02/21/2024 EXAM: CT CERVICAL SPINE WITHOUT CONTRAST 02/21/2024 01:19:35 AM TECHNIQUE: CT of the cervical spine was performed without the administration of intravenous contrast. Multiplanar reformatted images are provided for review. Automated exposure control, iterative reconstruction, and/or weight based adjustment of the mA/kV was utilized to reduce the radiation dose to as low as reasonably achievable. COMPARISON: None available. CLINICAL HISTORY: Neck trauma (Age >= 65y); Neck trauma, intoxicated or obtunded (Age >= 16y). FINDINGS: BONES AND ALIGNMENT: No acute fracture or traumatic malalignment. DEGENERATIVE CHANGES: No significant degenerative changes. SOFT TISSUES: No prevertebral soft tissue swelling. IMPRESSION: 1. No acute abnormality of the cervical spine. Electronically signed by: Franky Stanford MD 02/21/2024 01:33 AM EDT RP Workstation: [id number]   CT ANGIO HEAD NECK W WO CM (CODE STROKE) Result Date: 02/21/2024 EXAM: CTA HEAD AND NECK WITH AND WITHOUT 02/21/2024 01:18:14 AM TECHNIQUE: CTA of the head and neck was performed with and without the administration of 75 mL of intravenous iohexol  (OMNIPAQUE ) 350 MG/ML injection. Multiplanar 2D and/or 3D reformatted images are provided for review. Automated exposure control, iterative reconstruction, and/or weight based adjustment of the mA/kV was utilized to reduce the radiation dose to as low as reasonably achievable. Stenosis of the internal carotid arteries measured using NASCET criteria. COMPARISON: None available CLINICAL HISTORY: Neuro deficit, acute, stroke suspected. Stroke, Rt side weakness, hx strokes; Dr. Sallyann. FINDINGS: CTA NECK: AORTIC ARCH AND ARCH VESSELS: No dissection or arterial injury. No significant stenosis of the brachiocephalic or subclavian arteries. CERVICAL CAROTID ARTERIES: Progressive narrowing of the distal left internal carotid artery beginning at the petrous segment with occlusion at the distal cavernous segment. There is reconstitution of the supraclinoid portion with attenuated enhancement. The right internal carotid artery is patent without significant stenosis or dissection. No dissection or arterial injury is identified in the remaining cervical carotid arteries. CERVICAL VERTEBRAL ARTERIES: No dissection, arterial injury, or significant stenosis. LUNGS AND MEDIASTINUM: Biapical Emphysema. SOFT  TISSUES: No acute abnormality. BONES: No acute abnormality. CTA HEAD: ANTERIOR CIRCULATION: The left internal carotid artery demonstrates progressive narrowing beginning at the petrous segment with occlusion at the distal cavernous segment. There is reconstitution of the supraclinoid portion with attenuated enhancement. The left MCA is occluded at its origin with poor distal collateralization. Moderate stenosis of the right  MCA M1 segment. No significant stenosis of the anterior cerebral arteries. No aneurysm. POSTERIOR CIRCULATION: Mild stenosis of the right PCA p2 segment. Mild atherosclerotic irregularity of the left PCA without high-grade stenosis. No significant stenosis of the basilar artery. No significant stenosis of the vertebral arteries. No aneurysm. OTHER: No dural venous sinus thrombosis on this non-dedicated study. IMPRESSION: 1. Emergent large vessel occlusion of the left MCA at its origin with poor distal collateralization. 2. Progressive narrowing of the distal left internal carotid artery beginning at the petrous segment with occlusion at the distal cavernous segment and reconstitution of the supraclinoid portion with attenuated enhancement. 3. Moderate stenosis of the right MCA M1 segment. 4. Mild stenosis of the right PCA P2 segment. 5. Mild atherosclerotic irregularity of the left PCA without high-grade stenosis. Critical findings communicated to Dr. MAGDALENO Blower via telephone at 1:28 AM on 02/21/24. Electronically signed by: Franky Stanford MD 02/21/2024 01:32 AM EDT RP Workstation: [id number]   CT HEAD CODE STROKE WO CONTRAST Result Date: 02/21/2024 EXAM: CT HEAD WITHOUT CONTRAST 02/21/2024 12:40:34 AM TECHNIQUE: CT of the head was performed without the administration of intravenous contrast. Automated exposure control, iterative reconstruction, and/or weight based adjustment of the mA/kV was utilized to reduce the radiation dose to as low as reasonably achievable. COMPARISON: 02/26/2019 CLINICAL HISTORY: Neuro deficit, acute, stroke suspected. Stroke, Rt side weakness. FINDINGS: BRAIN AND VENTRICLES: No acute hemorrhage. There are old infarcts of the right MCA and PCA territory. There is a focal area of loss of gray-white differentiation in the posterior left MCA territory. ASPECTS is 8. Hyperdense left MCA. No hydrocephalus, extra-axial collection, mass effect, or midline shift. ASPECTS ([place] stroke program early  CT score) - ganglionic level infarction (caudate, lentiform nuclei, internal capsule, insula, M1-m3 cortex): 6 - supraganglionic infarction (m4-m6 cortex): 2 Total score (0-10 with 10 being normal): 8 ORBITS: No acute abnormality. SINUSES: No acute abnormality. SOFT TISSUES AND SKULL: No acute soft tissue abnormality. No skull fracture. IMPRESSION: 1. Acute/early subacute left MCA territory infarct with focal loss of gray-white differentiation (ASPECTS 8) and hyperdense left MCA. 2. No intracranial hemorrhage. 3. 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 Findings communicated to Dr. MAGDALENO Blower via [facility] text page at 12:50 AM on 02/21/24. Electronically signed by: Franky Stanford MD 02/21/2024 12:52 AM EDT RP Workstation: [id number]    Labs:  Basic Metabolic Panel: Recent Labs  Lab 03/08/24 0628 03/11/24 0449  NA 138 138  K 3.6 4.1  CL 103 107  CO2 26 22  GLUCOSE 107* 96  BUN 9 11  CREATININE 1.02 1.01  CALCIUM  9.0 8.8*    CBC: Recent Labs  Lab 03/08/24 0628 03/11/24 0449  WBC 4.3 4.8  HGB 14.9 14.4  HCT 44.2 43.0  MCV 93.8 95.8  PLT 266 275    CBG: No results for input(s): GLUCAP in the last 168 hours.  Brief HPI:   Frank Fischer is a 63 y.o. 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 Subsequently treated with TNK and thrombectomy was transition to progressive care.  Hospitalization complicated by severe alcohol withdrawal, initially treated with Librium followed by phenobarbital, now slowly improving on phenobarbital taper.  He was discharged to CIR yesterday, today when physical therapy tried to get him up he became unresponsive, systolic blood pressure dropped to the 50s/60s range, improved to low 100 after he was put in  Trendelenburg position, slowly improved, mental status improving however does have dysarthria from his current stroke.  The patient is being readmitted to inpatient rehab after interrupted stay due to syncopal episode. He currently requires Mod to Max assist with ADLs and transfer. Therapy evaluations completed due to patient decreased functional mobility was admitted for a comprehensive rehab program.      Inpatient Rehabilitation Course: Frank Fischer was admitted to rehab 03/01/2024 for inpatient therapies to consist of PT, ST and OT at least three hours five days a week. Past admission physiatrist, therapy team and rehab RN have worked together to provide customized collaborative inpatient rehab.  Anticoagulation: Lovenox  for DVT prophylaxis.  Aspirin  81 mg daily  Pain Management: Tylenol  as needed  Mood/Behavior/Sleep: Patient experienced increased delirium and agitation.  Patient made attempts to leave the unit and required redirection in intervention by security which necessitated medication adjustments.  He will discharge on low-dose Ativan  twice daily and Zyprexa at bedtime on short term basis. Close outpatient follow up with PCP advised.   Fluid/Nutrition/Electrolytes: Intake and output were monitored along with high-protein supplementation.   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     Hypertension: Syncopal episode 10/11 BP 43/34 at that time.  Neurowork-up was negative for acute infarct.  Patient was monitored for orthostatics hypotension.  Teds and abdominal binder maintained n.p.o. and fluids encouraged.  Discontinued amlodipine  10 mg.  Blood pressures were controlled on lisinopril  10 mg, reduced to 5 mg due to soft blood pressure.  Hypokalemia: Replaced with 40 mill equivalents on 10/8 potassium stable.    Hyperlipidemia: Zetia  10 mg and atorvastatin  80 mg   Bradycardia: Episodes noted mostly at night, EKG revealed normal sinus rhythm rate in the 50s.  Patient  to follow-up with cardiology outpatient.  Acute alcohol withdrawal: History of dependence drinking at least 6 beers a day.  Patient monitored for EtOH withdrawal along with restlessness/agitation and bradycardia.  He was placed on phenobarbital taper that was completed inpatient.  Continue thiamine .  Polysubstance abuse: History of positive tox screen cocaine and marijuana.  Cessation strongly advised.  Tobacco abuse: Nicotine patch 7 mg, tobacco education and cessation also advised.  Planned Outpatient Follow-Up:  -[facility] -PCP -PM&R    Rehab course: During patient's stay in rehab weekly team conferences were held to monitor patient's progress, set goals and discuss barriers to discharge. At admission, patient required Mod to Max assist with ADLs and transfer.    Occupational Therapy: Patient has met 5 of 13 long term goals due to improved activity tolerance, improved balance, and improved attention.  Patient to discharge at overall minimum assist level. Patient's care partner independent to provide the necessary physical assistance at discharge.  Due to behavioral changes the team decided it would be best for patient to leave early as his caregiver was able to assist at his current level, patient was not progressing due to his limited engagement. He will benefit from ongoing OT in home health setting to continue to advance functional skills in the area of BADL.   Physical Therapy:  Patient has met 4 of 8 long term goals due to improved activity tolerance, improved balance, improved postural control, increased strength, decreased pain, ability to compensate for deficits, improved attention, improved awareness, and improved coordination.  Patient to discharge at an wheelchair level mod assist.  He will benefit from ongoing skilled PT services in home health setting to continue to advance safe functional mobility, address ongoing impairments in transfers and stairs, endurance, gait, and  minimize fall risk.   Speech Therapy: Patient met 3 of 5 long-term goals and will discharge in an overall maximum assist level.  He will remain on dysphagia 3 diet/thin liquids.  He continues to require max assist overall for expression/comprehension due to ongoing aphasia.  Patient will benefit from ongoing speech therapy upon discharge to maximize remaining deficits, independence and reduce caregiver burden.  Discharge plan was discussed with patient and/or family member and they verbalized understanding and agreed with it.        Disposition:  Discharge disposition: 06-Home-Health Care Svc       Diet: Dysphagia 3--structures provided to the outside.  Special Instructions:  -No driving or operating heavy machinery until cleared by provider  -No smoking or alcohol or illicit drug use     Allergies as of 03/12/2024       Reactions   Penicillins Itching        Medication List     STOP taking these medications    nicotine 7 mg/24hr patch Commonly known as: NICODERM CQ - dosed in mg/24 hr Replaced by: nicotine 14 mg/24hr patch   thiamine  100 MG tablet Commonly known as: Vitamin B-1 Replaced by: thiamine  100 MG tablet       TAKE these medications    acetaminophen  325 MG tablet Commonly known as: TYLENOL  Take 1-2 tablets (325-650 mg total) by mouth every 4 (four) hours as needed for mild pain (pain score 1-3).   albuterol  108 (90 Base) MCG/ACT inhaler Commonly known as: VENTOLIN  HFA Inhale 2 puffs into the lungs every 6 (six) hours as needed for wheezing or shortness of breath.   aspirin  EC 81 MG tablet Take 1 tablet (81 mg total) by mouth daily. Swallow whole. What changed: additional instructions   atorvastatin  80 MG tablet Commonly known as: LIPITOR  Take 1 tablet (80 mg total) by mouth daily.   CertaVite/Antioxidants Tabs Take 1 tablet by mouth daily.   clopidogrel  75 MG tablet Commonly known as: PLAVIX  Take 1 tablet (75 mg total) by mouth  daily.   ezetimibe  10 MG tablet Commonly known as: Zetia  Take 1 tablet (10 mg total) by mouth daily.   feeding supplement Liqd Take 237 mLs by mouth 2 (two) times daily between meals.   folic acid  1 MG tablet Commonly known as: FOLVITE  Take 1 tablet (1 mg total) by mouth daily.   lisinopril  5 MG tablet Commonly known as: ZESTRIL  Take 1 tablet (5 mg total) by mouth daily. What changed:  medication strength how much to take when to take this   LORazepam  1 MG tablet Commonly known as: ATIVAN  Take 1 tablet (1 mg total) by mouth 2 (two) times daily. Notes to patient: TAKE 1 dose EVERY 12 HOURS    nicotine 14 mg/24hr patch Commonly known as: NICODERM CQ - dosed in mg/24 hours Place 1 patch (14 mg total) onto the skin daily. Replaces: nicotine 7 mg/24hr patch   tamsulosin  0.4 MG Caps capsule Commonly known as: FLOMAX  Take 1 capsule (0.4 mg total) by mouth daily.   thiamine   100 MG tablet Commonly known as: VITAMIN B1 Take 1 tablet (100 mg total) by mouth daily. Replaces: thiamine  100 MG tablet        Follow-up Information     Ngetich, Dinah C, NP Follow up.   Specialty: Family Medicine Why: Call for an appointment. Contact information: [address] [place] [address] [phone]          [facility] Follow up.   Specialty: Neurology Why: Call for an appointment. Contact information: [address] [place]  [address] [phone]        Carilyn Prentice BRAVO, MD Follow up.   Specialty: Physical Medicine and Rehabilitation Why: Office will call for appointment Contact information: [address] [place] [address] [place] [address] [phone]                 Signed: Daphne LOISE Satterfield 03/12/2024, 9:42 AM

## 2024-03-12 NOTE — Progress Notes (Signed)
 PROGRESS NOTE   Subjective/Complaints:  Agitation improved , pt aware of d/c date today, shakes my hand 3 times using Right hand Pt remains aphasic discussed ETOH and drug use cessation  ROS: Limited due to cognitive/behavioral/aphasia   Objective:   No results found.  Recent Labs    03/11/24 0449  WBC 4.8  HGB 14.4  HCT 43.0  PLT 275    Recent Labs    03/11/24 0449  NA 138  K 4.1  CL 107  CO2 22  GLUCOSE 96  BUN 11  CREATININE 1.01  CALCIUM  8.8*     Intake/Output Summary (Last 24 hours) at 03/12/2024 0756 Last data filed at 03/12/2024 0730 Gross per 24 hour  Intake 118 ml  Output --  Net 118 ml         Physical Exam: Vital Signs Blood pressure 123/73, pulse 80, temperature 98.1 F (36.7 C), resp. rate 18, height 5' 7 (1.702 m), weight 84 kg, SpO2 99%.   General: No acute distress Mood and affect are appropriate, less agitated Heart: Regular rate and rhythm no rubs murmurs or extra sounds Lungs: Clear to auscultation, breathing unlabored, no rales or wheezes Abdomen: Positive bowel sounds, soft nontender to palpation, nondistended Extremities: No clubbing, cyanosis, or edema Skin: No evidence of breakdown, no evidence of rash  4- RIght Delt bi tri grip, 4- RIght hip flex , 4- Knee ext 5/5 Left side  Aphasia limits exam  Improving attention to the right side  Skin: No evidence of breakdown, no evidence of rash Neurologic: pt alert, mumbles, oriented to person, not place. Can follow simple commands. Limited insight and awareness.  No abnl resting tone. No obvious limb ataxia.  Musc: No joint swelling in the upper or lower limbs Unchanged 10/24  Assessment/Plan: 1. Functional deficits due to CVA with R HP, Right neglect and aphasia  Stable for D/C today F/u PCP in 1-2 weeks F/u PM&R 2-3 weeks F/u neuro 1-2 months No driving  See D/C summary See D/C instructions   Care  Tool:  Bathing    Body parts bathed by patient: Chest, Abdomen, Right upper leg, Face, Right arm, Left upper leg, Left arm, Front perineal area, Buttocks   Body parts bathed by helper: Right lower leg, Left lower leg     Bathing assist Assist Level: Minimal Assistance - Patient > 75%     Upper Body Dressing/Undressing Upper body dressing   What is the patient wearing?: Pull over shirt    Upper body assist Assist Level: Maximal Assistance - Patient 25 - 49%    Lower Body Dressing/Undressing Lower body dressing      What is the patient wearing?: Underwear/pull up, Pants     Lower body assist Assist for lower body dressing: Maximal Assistance - Patient 25 - 49%     Toileting Toileting    Toileting assist Assist for toileting: Minimal Assistance - Patient > 75%     Transfers Chair/bed transfer  Transfers assist     Chair/bed transfer assist level: Minimal Assistance - Patient > 75%     Locomotion Ambulation   Ambulation assist      Assist level: Minimal Assistance -  Patient > 75% Assistive device: No Device Max distance: 165   Walk 10 feet activity   Assist     Assist level: Minimal Assistance - Patient > 75% Assistive device: No Device   Walk 50 feet activity   Assist    Assist level: Minimal Assistance - Patient > 75% Assistive device: No Device    Walk 150 feet activity   Assist    Assist level: Minimal Assistance - Patient > 75% Assistive device: No Device    Walk 10 feet on uneven surface  activity   Assist Walk 10 feet on uneven surfaces activity did not occur: Safety/medical concerns (decreased balance, poor attention/awareness)   Assist level: Minimal Assistance - Patient > 75% Assistive device:  (no device)   Wheelchair     Assist Is the patient using a wheelchair?: Yes Type of Wheelchair: Manual    Wheelchair assist level: Moderate Assistance - Patient 50 - 74% Max wheelchair distance: 165    Wheelchair 50  feet with 2 turns activity    Assist        Assist Level: Moderate Assistance - Patient 50 - 74%   Wheelchair 150 feet activity     Assist      Assist Level: Moderate Assistance - Patient 50 - 74%   Blood pressure 123/73, pulse 80, temperature 98.1 F (36.7 C), resp. rate 18, height 5' 7 (1.702 m), weight 84 kg, SpO2 99%.  Medical Problem List and Plan: 1. 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             -patient may shower  -10/28 discharge date, patient with poor awareness of deficits   -continue enclosure bed for safety, poor balance and no RIght sided awareness 2.  Antithrombotics: -DVT/anticoagulation:  Mechanical: Sequential compression devices, below knee Bilateral lower extremities Pharmaceutical: Lovenox              -antiplatelet therapy: Aspirin  3. Pain Management: Tylenol  prn  4. Mood/Behavior/Sleep: LCSW to follow for evaluation and support when available.              -antipsychotic agents: olanzepine 5mg  at bedtime, d/c after hospital stay             - sleep chart Ativan  IM 1mg  q6h prn Scheduled for 1mg  BID - only during hospitalization    5. Neuropsych/cognition: This patient is not capable of making decisions on his own behalf.             -hope to see further improvement in arousal/cognition as Pb tapers off.  Will use ativan  low dose BID plus zyprexa at bedtime to help management on ST basis 6. Skin/Wound Care: Routine pressure relief measures 7. Fluids/Electrolytes/Nutrition: Monitor intake and output.  Follow-up chemistries in AM.             - Ensure+ high protein +multivitamin+folic acid               -encourage adequate PO 8.  Hypertension: on home Amlodipine  10 mg and lisinopril  40 mg Vitals:   03/11/24 1953 03/12/24 0502  BP: 129/78 123/73  Pulse:  80  Resp: 18 18  Temp: 98 F (36.7 C) 98.1 F (36.7 C)  SpO2: 99% 99%   Controlled on  lisinopril  10mg  and no amlodipine  10/19.  Reduce lisinopril  to 5mg  , difficult for LT management since pt at risk for BP spike due to polysubstance abuse at home  9.  BPH: Flomax  0.4 mg 10. HLD: LDL 111 goal < 70--Zetia  10 mg and atorvastatin  80 mg 11.  Poststroke dysphagia: SLP consult continue dysphagia 3 12.  Tobacco abuse: nicotine patch 7 mg  13.  Polysubstance abuse: Tox screen positive for cocaine and marijuana. 14.  Acute alcohol withdrawal: History of dependence drinking at least 6 beers daily. On 10/7 pt experienced restlessness/agitation and bradycardia concerning for ETOH withdrawal.              -continue Thiamine   -10/13 placed on phenobarbital taper which has been completed              15.  Hypokalemia:K+replaced with 40 mEq on 10/8. Now 3.5 and stable. Continue to monitor     Latest Ref Rng & Units 03/11/2024    4:49 AM 03/08/2024    6:28 AM 03/04/2024    6:21 AM  BMP  Glucose 70 - 99 mg/dL 96  892  897   BUN 8 - 23 mg/dL 11  9  13    Creatinine 0.61 - 1.24 mg/dL 8.98  8.97  8.88   Sodium 135 - 145 mmol/L 138  138  135   Potassium 3.5 - 5.1 mmol/L 4.1  3.6  3.4   Chloride 98 - 111 mmol/L 107  103  101   CO2 22 - 32 mmol/L 22  26  23    Calcium  8.9 - 10.3 mg/dL 8.8  9.0  8.9   Improved after supplementation  16. Syncopal, unresponsive episode 10/11. (BP 43/34 at the time) -neuro work up negative for acute infarct -monitor orthostatic vs -TEDS and abdominal binder to help maintain bp -encourage PO, monitor I/O's. Off IVF currently 10/19 labs Monday  17.  Bradycardia mainly at night, no obvious medications, will check EKG.  Last EKG on 02/23/2024 showed normal sinus rhythm rate 66 bpm 10/16-19 /2025 Sinus rhythm rate in 50's   LOS: 11 days A FACE TO FACE EVALUATION WAS PERFORMED    Frank Fischer 03/12/2024, 7:56 AM

## 2024-03-12 NOTE — Progress Notes (Signed)
 Speech Language Pathology Daily Session Note  Patient Details  Name: [name] MRN: [id number] Date of Birth: 1960/06/05  Today's Date: 03/12/2024 SLP Individual Time: 0900-0930 Time Spent: 30 mins  Short Term Goals: Week 2: SLP Short Term Goal 1 (Week 2): STG = LTG due to ELOS  Skilled Therapeutic Interventions:   Pt greeted at bedside for tx targeting communication. He was awake upon SLP arrival, fairly agreeable to conversational tasks. He continues to demonstrate improved automatic responses. He was perseverating on the arrival of his family member, who was running late. He was able to answer initial Y/N questions w/ minA. He then attempted to get out of bed and use the restroom. He required maxA for sequencing and safety awareness. He ambulated to the bathroom with CGA, but was incontinent along the way. During clean up and peri care, he was able to follow one step commands with modA. Errorless learning required for problem solving. He was then assisted back to bed and SLP provided additional education re aphasia to pt's significant other. Additional questions were answered and direct handoff was made to nursing. See d/c summary for more info.   Pain  None reported  Therapy/Group: Individual Therapy  [name] 03/12/2024, 7:06 AM

## 2024-03-12 NOTE — Progress Notes (Signed)
 Inpatient Rehabilitation Care Coordinator Discharge Note   Patient Details  Name: Frank Fischer MRN: [id number] Date of Birth: 1960/12/25   Discharge location: HOME WITH [name] ASSISTING  WITH HIS CARE AWARE OF NEED FOR 24/7 CARE  Length of Stay: 11 DAYS  Discharge activity level: SUPERVISION/CGA LEVEL  Home/community participation: ACTIVE  Patient response un:Yzjouy Literacy - How often do you need to have someone help you when you read instructions, pamphlets, or other written material from your doctor or pharmacy?: Never  Patient response un:Dnrpjo Isolation - How often do you feel lonely or isolated from those around you?: Never  Services provided included: MD, RD, PT, OT, SLP, RN, CM, Pharmacy, Neuropsych, SW  Financial Services:  Field seismologist Utilized: Medicare    Choices offered to/list presented to: PT AND [name]  Follow-up services arranged:  Home Health, DME, Patient/Family request agency HH/DME Home Health Agency: [facility]   PT  OT  SP    DME : [facility] BED, WC, 3 IN 1 HH/DME Requested Agency: [facility]  Patient response to transportation need: Is the patient able to respond to transportation needs?: Yes In the past 12 months, has lack of transportation kept you from medical appointments or from getting medications?: No In the past 12 months, has lack of transportation kept you from meetings, work, or from getting things needed for daily living?: No   Patient/Family verbalized understanding of follow-up arrangements:  Yes  Individual responsible for coordination of the follow-up plan: Frank Fischer  [phone]  Confirmed correct DME delivered: Frank Fischer 03/12/2024    Comments (or additional information): [name] WAS HERE FOR HANDS ON EDUCATION THIS AM AND FEELS COMFORTABLE WITH PT'S CARE. AWARE HE WILL NEED 24/7 CARE AND HIGH RISK TO FALL IF LEFT ALONE.   Summary of Stay    Date/Time Discharge Planning CSW   03/10/24 0843 Significant other is scheduled for family training Friday from 9-12. She is aware of the care pt will require and is a CNA herself. She has been here and aware of his personality and want to go home RGD  03/03/24 0835 Home with significant other who works as a Lawyer and is trying to get paid to work for pt. Pt wanting to go home soon, but lacks awareness of his deficits and how much care he actually is. Neuro-psych to see while here RGD       Frank Fischer, Frank Fischer

## 2024-03-12 NOTE — Progress Notes (Signed)
 Occupational Therapy Session Note  Patient Details  Name: Frank Fischer MRN: [id number] Date of Birth: [birth year]  Today's Date: 03/12/2024 OT Individual Time: 0930-1030 OT Individual Time Calculation (min): 60 min    Short Term Goals: Week 1:  OT Short Term Goal 1 (Week 1): Pt will be able to hold stand balance with CGA to wash bottom. OT Short Term Goal 1 - Progress (Week 1): Met OT Short Term Goal 2 (Week 1): pt will be able to pull pants over hips with mod A. OT Short Term Goal 2 - Progress (Week 1): Met OT Short Term Goal 3 (Week 1): Pt will don shirt with min A . OT Short Term Goal 3 - Progress (Week 1): Progressing toward goal OT Short Term Goal 4 (Week 1): Pt will demonstrate improved R side visual awareness to scan to his R side with min cues vs max cues needed on evaluation . OT Short Term Goal 4 - Progress (Week 1): Progressing toward goal Week 2:  OT Short Term Goal 1 (Week 2): STGs = LTGs  Skilled Therapeutic Interventions/Progress Updates:    Met with pt and significant other for family education. Pt fairly resistant to showering and dressing today so I could educated his SO on cuing strategies but he eventually agreed. [name] had hands on practice with ambulating pt to the bathroom with guided HHA with min A.  Pt can be CGA most of the time, but often needs min A due to impulsivity with trying to sit fast before he is close enough to the surface.  Pt completed shower with CGA and mod cues and UB dressing with mod A and LB dressing mod-max A.    Pt does best with tactile guiding cues.    Took them to tub room as it is set up in the same direction as at home.  Because pt has to transfer into tub to his R side he requires, mod A to guide his hips but coming out of the tub with CGA.    Reviewed set up of tub bench with curtain to avoid water  spillage.  Upon return to room, had pt sit in new w/c and adjusted leg rests. Demonstrated to her how to fold up w/c.  Pt sitting in wc  with SO with him to meet with next therapist.   Therapy Documentation Precautions:  Precautions Precautions: Fall Precaution/Restrictions Comments: R inattention Restrictions Weight Bearing Restrictions Per Provider Order: No   Pain:  No c/o pain during session, pt very eager to leave       Therapy/Group: Individual Therapy  Sharonna Vinje 03/12/2024, 8:28 AM

## 2024-03-12 NOTE — Plan of Care (Signed)
  Problem: RH Balance Goal: LTG: Patient will maintain dynamic sitting balance (OT) Description: LTG:  Patient will maintain dynamic sitting balance with assistance during activities of daily living (OT) Outcome: Completed/Met Goal: LTG Patient will maintain dynamic standing with ADLs (OT) Description: LTG:  Patient will maintain dynamic standing balance with assist during activities of daily living (OT)  Outcome: Adequate for Discharge   Problem: Sit to Stand Goal: LTG:  Patient will perform sit to stand in prep for activites of daily living with assistance level (OT) Description: LTG:  Patient will perform sit to stand in prep for activites of daily living with assistance level (OT) Outcome: Completed/Met   Problem: RH Eating Goal: LTG Patient will perform eating w/assist, cues/equip (OT) Description: LTG: Patient will perform eating with assist, with/without cues using equipment (OT) Outcome: Completed/Met   Problem: RH Grooming Goal: LTG Patient will perform grooming w/assist,cues/equip (OT) Description: LTG: Patient will perform grooming with assist, with/without cues using equipment (OT) Outcome: Adequate for Discharge   Problem: RH Bathing Goal: LTG Patient will bathe all body parts with assist levels (OT) Description: LTG: Patient will bathe all body parts with assist levels (OT) Outcome: Completed/Met   Problem: RH Dressing Goal: LTG Patient will perform upper body dressing (OT) Description: LTG Patient will perform upper body dressing with assist, with/without cues (OT). Outcome: Adequate for Discharge Goal: LTG Patient will perform lower body dressing w/assist (OT) Description: LTG: Patient will perform lower body dressing with assist, with/without cues in positioning using equipment (OT) Outcome: Completed/Met   Problem: RH Toileting Goal: LTG Patient will perform toileting task (3/3 steps) with assistance level (OT) Description: LTG: Patient will perform toileting  task (3/3 steps) with assistance level (OT)  Outcome: Adequate for Discharge   Problem: RH Functional Use of Upper Extremity Goal: LTG Patient will use RT/LT upper extremity as a (OT) Description: LTG: Patient will use right/left upper extremity as a stabilizer/gross assist/diminished/nondominant/dominant level with assist, with/without cues during functional activity (OT) Outcome: Adequate for Discharge   Problem: RH Toilet Transfers Goal: LTG Patient will perform toilet transfers w/assist (OT) Description: LTG: Patient will perform toilet transfers with assist, with/without cues using equipment (OT) Outcome: Adequate for Discharge   Problem: RH Tub/Shower Transfers Goal: LTG Patient will perform tub/shower transfers w/assist (OT) Description: LTG: Patient will perform tub/shower transfers with assist, with/without cues using equipment (OT) Outcome: Adequate for Discharge   Problem: RH Awareness Goal: LTG: Patient will demonstrate awareness during functional activites type of (OT) Description: LTG: Patient will demonstrate awareness during functional activites type of (OT) Outcome: Not Met (add Reason)

## 2024-03-12 NOTE — Progress Notes (Signed)
 Inpatient Rehabilitation Discharge Medication Review by a Pharmacist  A complete drug regimen review was completed for this patient to identify any potential clinically significant medication issues.  High Risk Drug Classes Is patient taking? Indication by Medication  Antipsychotic No   Anticoagulant No   Antibiotic No   Opioid No   Antiplatelet Yes Aspirin , Plaix-CVA  x 3 months (~Jun 01, 2024) then aspirin  alone  Hypoglycemics/insulin No   Vasoactive Medication Yes Lisinopril --HTN,  Tamsulosin -BPH  Chemotherapy No   Other Yes Ativan -anxiety Nicotine patches-dependence Thiamine , folic acid , MVI-supplementation Albuterol -Dyspnea Atorvastatin , Zetia -HLD     Type of Medication Issue Identified Description of Issue Recommendation(s)  Drug Interaction(s) (clinically significant)     Duplicate Therapy     Allergy     No Medication Administration End Date  Plavix  to end ~Jan 13th 2026 May wish to add stop date   Incorrect Dose     Additional Drug Therapy Needed     Significant med changes from prior encounter (inform family/care partners about these prior to discharge).    Other       Clinically significant medication issues were identified that warrant physician communication and completion of prescribed/recommended actions by midnight of the next day:  No  Name of provider notified for urgent issues identified:   Provider Method of Notification:     Pharmacist comments:   Time spent performing this drug regimen review (minutes):  20   Symantha Steeber A. Lyle, PharmD, BCPS, FNKF Clinical Pharmacist [place] Please utilize Amion for appropriate phone number to reach the unit pharmacist [facility])  03/12/2024 11:21 AM

## 2024-03-12 NOTE — Plan of Care (Signed)

## 2024-03-12 NOTE — Progress Notes (Signed)
 Occupational Therapy Discharge Summary  Patient Details  Name: Frank Fischer MRN: [id number] Date of Birth: 04-22-1961  Date of Discharge from OT service:March 12, 2024   Patient has met 5 of 13 long term goals due to improved activity tolerance, improved balance, and improved attention.  Patient to discharge at [facility] Assist level.  Patient's care partner is independent to provide the necessary physical and cognitive assistance at discharge.    Family education with his SO.  Demonstrated and return demonstrated ambulation with HHA,  toilet, tub transfers with bench,  self care skills with cuing strategies, w/c management.   Discussed the vital importance of alcohol and drug abstinence.   Reasons goals not met: At initial evaluation, pt's LOS estimated to be 2 weeks with pt participating well. Pt engaged in session the first 3 days and then he had a behavior change as he was upset with being in the hospital and tried to leave on his own.  The team decided it would be best for pt to leave early as his caregiver is able to assist him at his current level and pt was not progressing due to his limited engagement.    In his home environment where he is more relaxed he may have more improvement with his HH therapies and guidance from his SO.    Recommendation:  Patient will benefit from ongoing skilled OT services in home health setting to continue to advance functional skills in the area of BADL.  Equipment: Tub bench  Reasons for discharge: lack of progress toward goals (based on behavioral engagement)  Patient/family agrees with progress made and goals achieved: Yes  OT Discharge Precautions/Restrictions  Precautions Precautions: Fall Recall of Precautions/Restrictions: Impaired Precaution/Restrictions Comments: R inattention Restrictions Weight Bearing Restrictions Per Provider Order: No  ADL ADL Eating: Supervision/safety Grooming: Minimal assistance Upper Body Bathing:  Supervision/safety Where Assessed-Upper Body Bathing: Shower Lower Body Bathing: Contact guard Where Assessed-Lower Body Bathing: Shower Upper Body Dressing: Moderate assistance Where Assessed-Upper Body Dressing: Edge of bed Lower Body Dressing: Moderate assistance Where Assessed-Lower Body Dressing: Edge of bed Toileting: Maximal assistance Where Assessed-Toileting: Teacher, adult education: Curator Method: Proofreader: Grab bars, Raised toilet seat Tub/Shower Transfer: Minimal assistance Tub/Shower Transfer Method: Ship broker: Emergency planning/management officer, Acupuncturist: Insurance underwriter Method: Designer, industrial/product: Emergency planning/management officer, Grab bars Vision Baseline Vision/History: 0 No visual deficits Patient Visual Report: Other (comment) Vision Assessment?: Wears glasses for reading;Yes Eye Alignment: Impaired (comment) (R eye inferiorly deviated) Ocular Range of Motion: Restricted on the right (B eyes do not cross midline) Alignment/Gaze Preference: Gaze left Tracking/Visual Pursuits: Left eye does not track medially;Right eye does not track laterally Saccades: Impaired - to be further tested in functional context;Other (comment) (unable to shift eyes to the R) Visual Fields: Right homonymous hemianopsia Diplopia Assessment: Other (comment) (unable to accurately assess due to limited communication skills) Perception  Perception: Impaired Praxis Praxis: Impaired Praxis Impairment Details: Motor planning;Organization Cognition Cognition Overall Cognitive Status: Impaired/Different from baseline Arousal/Alertness: Awake/alert Orientation Level: Person;Place;Situation;Nonverbal/unable to assess Person: Oriented Memory: Impaired Sustained Attention: Appears intact Selective Attention: Impaired Awareness: Impaired Awareness Impairment: Intellectual  impairment Problem Solving: Impaired Problem Solving Impairment: Functional basic Behaviors: Impulsive;Poor frustration tolerance Safety/Judgment: Impaired Brief Interview for Mental Status (BIMS) Repetition of Three Words (First Attempt): 1 Temporal Orientation: Year: Missed by more than 5 years (1440) Temporal Orientation: Month: No answer Temporal Orientation: Day: No answer Recall: Sock:  No answer Recall: Blue: No answer Recall: Bed: No answer BIMS Summary Score: 99 Sensation Sensation Light Touch: Impaired Detail Central sensation comments: unable to feel pinch and tickle on R arm Light Touch Impaired Details: Impaired RUE;Impaired RLE Hot/Cold: Impaired by gross assessment Proprioception Impaired Details: Impaired RUE;Impaired RLE Stereognosis: Impaired by gross assessment Additional Comments: difficult to assess due to aphasia, assessment by observation Coordination Gross Motor Movements are Fluid and Coordinated: No Fine Motor Movements are Fluid and Coordinated: No Coordination and Movement Description: grossly uncoordinated due to R hemiparesis, R inattention, impaired motor planning/sequencing, decreased balance/coordination, and poor awareness Finger Nose Finger Test: unable to follow test for either hand due to receptive aphasia/ motor apraxia Motor  Motor Motor: Hemiplegia;Motor apraxia;Motor perseverations;Motor impersistence Motor - Skilled Clinical Observations: R hemiparesis, R inattention, impaired motor planning/sequencing, decreased balance/coordination, and poor awareness Motor - Discharge Observations: R hemiparesis, R inattention, impaired motor planning/sequencing, decreased balance/coordination, and poor awareness that has slightly improved since evaluation. Difficult to accurately assess due to pt's either decreased motor planning or decreased participation in following VC Mobility    Sit to stand transfers CGA,  for increased safety needs min A with  transfers and ambulation  Trunk/Postural Assessment  Postural Control Trunk Control: R lean w/ fatigue Righting Reactions: delayed Protective Responses: delayed  Balance Balance Balance Assessed: Yes Static Sitting Balance Static Sitting - Balance Support: Feet supported;Bilateral upper extremity supported Static Sitting - Level of Assistance: 6: Modified independent (Device/Increase time) Dynamic Sitting Balance Dynamic Sitting - Balance Support: Feet supported;No upper extremity supported Dynamic Sitting - Level of Assistance: 5: Stand by assistance;6: Modified independent (Device/Increase time) Static Standing Balance Static Standing - Balance Support: No upper extremity supported Static Standing - Level of Assistance: 4: Min assist Dynamic Standing Balance Dynamic Standing - Balance Support: No upper extremity supported;During functional activity Dynamic Standing - Level of Assistance: 4: Min assist Dynamic Standing - Balance Activities: Lateral lean/weight shifting Extremity/Trunk Assessment RUE Assessment Active Range of Motion (AROM) Comments: WFL General Strength Comments: 4-/5 pt has great difficulty utilizing R hand and arm in functional tasks due severely impaired sensation.  He is making strong attempts to use R hand.  He has made improvements with his ability to hold onto a washcloth or pull up his pants. LUE Assessment LUE Assessment: Within Functional Limits   Batu Cassin 03/12/2024, 2:49 PM

## 2024-03-14 NOTE — Plan of Care (Signed)
  Problem: RH Expression Communication Goal: LTG Patient will express needs/wants via multi-modal(SLP) Description: LTG:  Patient will express needs/wants via multi-modal communication (gestures/written, etc) with cues (SLP) Outcome: Not Met (add Reason) Goal: LTG Patient will verbally express basic/complex needs(SLP) Description: LTG:  Patient will verbally express basic/complex needs, wants or ideas with cues  (SLP) Outcome: Not Met (add Reason)   Problem: RH Swallowing Goal: LTG Patient will consume least restrictive diet using compensatory strategies with assistance (SLP) Description: LTG:  Patient will consume least restrictive diet using compensatory strategies with assistance (SLP) Outcome: Completed/Met   Problem: RH Expression Communication Goal: LTG Patient will increase speech intelligibility (SLP) Description: LTG: Patient will increase speech intelligibility at word/phrase/conversation level with cues, % of the time (SLP) Outcome: Completed/Met   Problem: RH Attention Goal: LTG Patient will demonstrate this level of attention during functional activites (SLP) Description: LTG:  Patient will will demonstrate this level of attention during functional activites (SLP) Outcome: Completed/Met

## 2024-03-14 NOTE — Progress Notes (Signed)
 Speech Language Pathology Discharge Summary  Patient Details  Name: Frank Fischer MRN: [id number] Date of Birth: January 26, 1961  Date of Discharge from SLP service:March 12, 2024  Patient has met 3 of 5 long term goals.  Patient to discharge at overall Max level.  Reasons goals not met: pt participation   Clinical Impression/Discharge Summary:  Slight progress noted this stay, as evidenced by improved attention, dysarthria, and and dysphagia. He is discharging on a Dys 3 diet/thin liquids w/o difficulty. He continues to require maxA overall for expression/comprehension d/t ongoing aphasia. Pt participation notably reduced success this stay. However, he would benefit from continued ST upon d/c to target remaining deficits, maximize pt independence, and reduce caregiver burden. Pt/family education complete.   Care Partner:  Caregiver Able to Provide Assistance: Yes  Type of Caregiver Assistance: Cognitive;Physical  Recommendation:  Home Health SLP;24 hour supervision/assistance  Rationale for SLP Follow Up: Maximize functional communication;Reduce caregiver burden   Equipment: n/a   Reasons for discharge: Discharged from hospital   Patient/Family Agrees with Progress Made and Goals Achieved: Yes    Frank Fischer 03/14/2024, 11:26 AM

## 2024-03-16 ENCOUNTER — Ambulatory Visit: Payer: Self-pay

## 2024-03-16 ENCOUNTER — Telehealth: Payer: Self-pay

## 2024-03-16 NOTE — Telephone Encounter (Signed)
 Copied from CRM [id number]. Topic: Clinical - Home Health Verbal Orders >> Mar 16, 2024 11:35 AM Graeme ORN wrote: Caller/Agency: Signe Eastern Callback Number: [phone] Service Requested: Physical Therapy Frequency: 1 time per week for 8 weeks.  Any new concerns about the patient? Yes - following medicine is missing from home: tylenol , albuterol  inhaler, atorvastatin , ezetimibe , ensure, minor bp drop upon standing lightheaded at times. Since hospital had black tarry stool on blood thinner. Seen in person look at toe nails - difficult trip unusual pattern.

## 2024-03-16 NOTE — Telephone Encounter (Signed)
 FYI Only or Action Required?: FYI only for provider.  Patient was last seen in primary care on 07/22/2023 by Ngetich, Roxan BROCKS, NP.  Called Nurse Triage reporting Low Blood Pressure.  Symptoms began n/a.  Interventions attempted: Other: n/a.  Symptoms are: n/a.  Triage Disposition: Information or Advice Only Call  Patient/caregiver understands and will follow disposition?: Yes Reason for Disposition  [1] Caller is not with the adult (patient) AND [2] probable NON-URGENT symptoms  Answer Assessment - Initial Assessment Questions PT stated affects of stroke - ability to follow commands and speak is compromised. Stay at inpatient rehab was cut off due to desire to leave and lack of participation. Please advise.   1. REASON FOR CALL: What is the main reason for your call? or How can I best help you?     Signe Eastern, PT with [facility] calling in to report minor drop in BP upon standing, patient was in hospital for a stroke, on Plavix  and Aspirin . Having black tarry stool, did not have this before the stroke. Might need to see a specialist for toenails, several medications missing from the home.  2. SYMPTOMS : Do you have any symptoms?      Black tarry stool  Protocols used: Information Only Call - No Triage-A-AH  Copied from CRM [id number]. Topic: Clinical - Red Word Triage >> Mar 16, 2024 11:40 AM Frank Fischer ORN wrote: Red Word that prompted transfer to Nurse Triage: minor bp drop upon standing, lightheaded at times. Since hospital had black tarry stool. On blood thinner. May be sign of internal bleed

## 2024-03-16 NOTE — Telephone Encounter (Signed)
 Call returned to Jim x 2 at the number listed below and the phone rung continuously, no answer, and no voicemail picked up.

## 2024-03-16 NOTE — Telephone Encounter (Signed)
 Please schedule hospital follow-up appointment for evaluation.

## 2024-03-22 NOTE — Progress Notes (Signed)
 I saw MCLAIN FREER in neurology clinic on 03/31/24 in follow up for bilateral hand pain, numbness, and tingling.  HPI: Frank Fischer is a 63 y.o. year old male with a history of right-handed male with a medical history of stroke (2020, right hemisphere), HTN, HLD, COPD, prostate cancer, OA, chronic headaches, and cervical radiculopathy who we last saw on 09/12/22.  To briefly review: 09/12/22: Patient has had symptoms for a few years. He cut his left arm around 2000. The arm has been numb in the anterior left forearm where he was cut since then. He describes achiness that he says is like squeezing with numbness and tingling. He thinks the left is worse than the right. He sometimes has difficulty picking things up. His symptoms are constant and not worse at night. The symptoms do not wake him up at night. He has had 2 surgeries on his left forearm previous due to ligament damage per patient.    He has a history of cervical radiculopathy and has pain that comes and goes. He gets cramping of the neck. He denies radiating pain. He has had nerve blocks in the past, but this did not change the symptoms in his arm.   He denies any numbness, tingling, or weakness in the legs.   He does not report any constitutional symptoms like fever, night sweats, anorexia or unintentional weight loss.   EtOH use: 1-2 beers per day  Restrictive diet? No Family history of neuropathy/myopathy/NM disease? Brother with back and neck spine surgeries  Most recent Assessment and Plan (09/12/22): Frank Fischer is a 63 y.o. male who presents for evaluation of bilateral hand pain, numbness, and tingling. He has a relevant medical history of stroke (2020, right hemisphere), HTN, HLD, COPD, prostate cancer, OA, chronic headaches, and cervical radiculopathy. His neurological examination is pertinent for residual left sided weakness and numbness and positive Tinel's test at right carpal tunnel. His left arm is chronic numb  since cutting his left forearm. Available diagnostic data is significant for MRI brain in 2020 showed multifocal acute right hemispheric stroke. 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    Patient's current hand pain, numbness, and tingling could be the result of carpal tunnel, cervical radiculopathy, and possible nerve damage on left from previous injury to left forearm. I will get an EMG to better characterize.   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 There were no active changes. I recommended MRI cervical spine. This has not been completed. He no showed to PT x3 so was discharged from PT at that point.  Patient presented to ED on 02/21/24 for right hemiplegia, aphasia, and left gaze. This occurred acutely in front of his friend. iNIHSS was 25. CT head showed hypodensity in right frontal and bilateral parietal lobes and hyperdense left MCA. CTA head and neck showed left ICA and M1 occlusion. He got TNK and thrombectomy (TICI 3). UDS was positive for cocaine and THC. LDL was 111. HbA1c was 6.1. After 24 hours, patient was started on aspirin  81 mg daily alone  given large infarct size but eventually the plan became DAPT for 3 months with asa and plavix  followed by asa alone.SABRA He was continued on home medications for HLD (suspected that he was not taking) atorvastatin  80 mg and Zetia  10 mg daily. Patient was working with PT on 02/28/24 and became unresponsive with BP in 50s to 60s. CTH and EEG were negative. Patient improved with improved  BP.  Since discharge, he is getting PT/OT and speech therapy (getting all 3 daily and nursing daily). The right side is still weak but getting stronger. He is walking with a walker. He had one fall 3 days after discharge where he had difficulty getting out of a chair and slid to the floor. He is still struggling with speech, with word finding and sometimes gibberish. His friend thinks this may be a little better than prior as well.   He is taking aspirin  81 mg daily and plavix  75 mg daily. He is taking atorvastatin  80 mg and zetia  10 mg daily for HLD. Friend does confirm that he had not been taking his medication, but now is taking them all.  He has significantly cut back on tobacco, only having an occasional puff. He has not done any drugs or alcohol since his stroke.    MEDICATIONS:  Outpatient Encounter Medications as of 03/31/2024  Medication Sig Note   acetaminophen  (TYLENOL ) 325 MG tablet Take 1-2 tablets (325-650 mg total) by mouth every 4 (four) hours as needed for mild pain (pain score 1-3).    albuterol  (VENTOLIN  HFA) 108 (90 Base) MCG/ACT inhaler Inhale 2 puffs into the lungs every 6 (six) hours as needed for wheezing or shortness of breath.    aspirin  EC 81 MG tablet Take 1 tablet (81 mg total) by mouth daily. Swallow whole.    atorvastatin  (LIPITOR ) 80 MG tablet Take 1 tablet (80 mg total) by mouth daily. 02/21/2024: Patient's wife confirmed non-compliance - however, she states the patient took this medication within the past 7 days. Spouse confirmed this was from the Rx filled 09/18/23 #30, 30 DS.   clopidogrel  (PLAVIX ) 75 MG tablet Take 1 tablet (75 mg total) by mouth daily.    ezetimibe  (ZETIA ) 10 MG tablet Take 1 tablet (10 mg total) by mouth daily. 02/21/2024: Patient's wife confirmed non-compliance - however, she states the patient took this medication within the past 7 days. Spouse confirmed this was from the Rx filled 09/18/23 #30, 30 DS.   feeding supplement (ENSURE PLUS HIGH  PROTEIN) LIQD Take 237 mLs by mouth 2 (two) times daily between meals.    folic acid  (FOLVITE ) 1 MG tablet Take 1 tablet (1 mg total) by mouth daily.    lisinopril  (ZESTRIL ) 5 MG tablet Take 1 tablet (5 mg total) by mouth daily.    LORazepam  (ATIVAN ) 1 MG tablet Take 1 tablet (1 mg total) by mouth 2 (two) times daily.    Multiple Vitamin (MULTIVITAMIN WITH MINERALS) TABS tablet Take 1 tablet by mouth daily.    nicotine (NICODERM CQ - DOSED IN MG/24 HOURS) 14 mg/24hr patch Place 1 patch (14 mg total) onto the skin daily.    tamsulosin  (FLOMAX ) 0.4 MG CAPS capsule Take 1 capsule (0.4 mg total) by mouth daily.    thiamine  (VITAMIN B1) 100 MG tablet Take 1 tablet (100 mg total) by mouth daily.    [DISCONTINUED] feeding supplement (ENSURE PLUS HIGH PROTEIN) LIQD Take 237 mLs by mouth 2 (two) times daily between meals.    [DISCONTINUED] nicotine (NICODERM CQ -  DOSED IN MG/24 HOURS) 14 mg/24hr patch Place 1 patch (14 mg total) onto the skin daily. (Patient not taking: Reported on 03/26/2024)    No facility-administered encounter medications on file as of 03/31/2024.    PAST MEDICAL HISTORY: Past Medical History:  Diagnosis Date   Arthritis    Cervical stenosis of spine    Chronic headaches    Dental caries    periodontal disease   Frequency of urination    History of cerebral infarction 11-28-2017  per pt never had symptoms   per MRI imaging 10-31-2016 2 small chronic lacunar infartions in the right frontal periventricular white matter , and chronic left medial orbital fracture (as seen 2014 MRI)   Hypertension    Noncompliance with medication regimen    Prostate cancer [facility]) urologist-  dr winter/  oncologist-  dr patrcia   dx 07-21-2017--- Stage T1c, Gleason 4+3,  PSA 13.40,  vol 49.7cc--  scheduled for radioactive seed implants 12-03-2017   Stroke [facility])     PAST SURGICAL HISTORY: Past Surgical History:  Procedure Laterality Date   ABDOMINAL EXPLORATION SURGERY  age 51   per pt had  Small Bowel Resection and Appendectomy   APPENDECTOMY     ARM SURGERY Left 1999   REPAIR INJURY-- ARM CUT ABOUT OFF   IR CATHETER TUBE CHANGE  10/28/2021   IR CT HEAD LTD  02/21/2024   IR GUIDED DRAIN W CATHETER PLACEMENT  09/03/2021   IR PERCUTANEOUS ART THROMBECTOMY/INFUSION INTRACRANIAL INC DIAG ANGIO  02/21/2024   IR US  GUIDANCE  09/03/2021   IR US  GUIDE VASC ACCESS RIGHT  02/21/2024   PROSTATE BIOPSY  07/21/2017  dr winter office   RADIOACTIVE SEED IMPLANT N/A 12/03/2017   Procedure: RADIOACTIVE SEED IMPLANT/BRACHYTHERAPY IMPLANT;  Surgeon: Devere Lonni Righter, MD;  Location: [facility];  Service: Urology;  Laterality: N/A;  ONLY NEEDS 120 MIN   RADIOLOGY WITH ANESTHESIA N/A 02/21/2024   Procedure: RADIOLOGY WITH ANESTHESIA;  Surgeon: Radiologist, Medication, MD;  Location: [facility] OR;  Service: Radiology;  Laterality: N/A;   SPACE OAR INSTILLATION N/A 12/03/2017   Procedure: SPACE OAR INSTILLATION;  Surgeon: Devere Lonni Righter, MD;  Location: [facility];  Service: Urology;  Laterality: N/A;   TOOTH EXTRACTION N/A 09/21/2018   Procedure: DENTAL EXTRACTIONS WITH ALVEOLIPLASTY;  Surgeon: Sheryle Hamilton, DDS;  Location: [facility] OR;  Service: Oral Surgery;  Laterality: N/A;    ALLERGIES: Allergies  Allergen Reactions   Penicillins Itching    FAMILY HISTORY: Family History  Problem Relation Age of Onset   Lung cancer Mother    Hypertension Brother    Diabetes Paternal Grandfather    Prostate cancer Cousin     SOCIAL HISTORY: Social History   Tobacco Use   Smoking status: Every Day    Current packs/day: 0.25    Average packs/day: 0.3 packs/day for 40.0 years (10.0 ttl pk-yrs)    Types: Cigarettes   Smokeless tobacco: Never  Vaping Use   Vaping status: Never Used  Substance Use Topics   Alcohol use: Yes    Alcohol/week: 21.0 standard drinks of alcohol    Types: 21 Cans of beer per week    Comment: 1-2 12oz  beers every evening   Drug use: Yes     Types: Marijuana    Comment:  per pt uses once week   Social History   Social History Narrative   Lives at home alone   Right-handed   Caffeine : tea throughout the day  03/04/19 Mr RAYMONT ANDREONI is a 63 year old male who is single living at home alone in his apartment. He voices his support system is his fiance, Vena Pipes and brother Saatvik Thielman. He denies issues with transportation to medical appointments. He is independent in his care needs   disablity    Objective:  Vital Signs:  BP 122/83   Pulse 80   Ht 5' 8 (1.727 m)   Wt 185 lb (83.9 kg)   SpO2 94%   BMI 28.13 kg/m   General: No acute distress.  Patient appears well-groomed.   Head:  Normocephalic/atraumatic Neck: supple Heart: regular rate and rhythm Vascular: No carotid bruits.  Neurological Exam: Mental status: alert, significant aphasia; unable to repeat. Dysarthric. Difficulty following commands.  Cranial nerves: CN I: not tested CN II: pupils equal, round and reactive to light, visual fields intact CN III, IV, VI:  No ptosis. Has left gaze preference. Does cross over to right when name is called. CN V: facial sensation appears intact. CN VII: upper and lower face symmetric CN VIII: hearing intact CN IX, X: uvula midline CN XI: sternocleidomastoid and trapezius muscles intact CN XII: tongue midline  Bulk & Tone: normal Motor:  grossly 5/5 throughout in arms. Difficult to test legs given patient would not follow commands Deep Tendon Reflexes:  2+ throughout,  toes downgoing.   Sensation:  Pinch sensation appears intact in all extremities. Finger to nose testing:  difficult to test as patient cannot follow commands well Gait:  Able to stand and walk with walker. Drags right leg.    Lab and Test Review: New results: 03/11/24: BMP unremarkable CBC unremarkable  02/21/24: Urine drug screen: cocaine positive, THC positive EtOH: elevated to 130 Lipid panel: tChol 159, LDL 92, TG  130 HbA1c: 5.7  03/07/23: HbA1c: 6.3 CBC unremarkable CMP unremarkable TSH wnl Lipid panel: tChol 195, LDL 120, TG 151  EMG (11/04/22): NCV & EMG Findings: Extensive electrodiagnostic evaluation of bilateral upper limbs shows: Bilateral median, ulnar, and radial sensory responses are within normal limits. The right ulnar sensory response is 41% of the response in the left ulnar sensory response. Left median (APB) motor response shows reduced amplitude (4.3 mV). The right median (APB) motor response is within normal limits. Left ulnar (ADM) motor response shows reduced amplitude (4.2 mV). Right ulnar (ADM) motor response shows decreased conduction velocity from above the elbow to below the elbow stimulation sites (40 m/s). Chronic motor axon loss changes without accompanying active denervation changes are seen in bilateral first dorsal interosseous, bilateral extensor indicis proprius, bilateral flexor carpi ulnaris, bilateral triceps, bilateral cervical paraspinal (C7 level), and left abductor pollicis brevis muscles.   Impression: This is an abnormal study. 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 The findings are mild in degree electrically at right C7 and right C8 levels. The findings are moderate in degree electrically at left C8 level. The findings are moderate to severe at left C7 and left T1 levels. 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 Severity is difficult to grade due to #1 above. No electrodiagnostic evidence of a right or left median mononeuropathy at or distal to the wrist (ie: carpal tunnel syndrome).   CT head wo contrast (02/21/24): IMPRESSION: 1. Acute/early subacute left MCA  territory infarct with focal loss  of gray-white differentiation (ASPECTS 8) and hyperdense left MCA. 2. No intracranial hemorrhage. 3. 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  CTA head and neck (02/21/24): IMPRESSION: 1. Emergent large vessel occlusion of the left MCA at its origin with poor distal collateralization. 2. Progressive narrowing of the distal left internal carotid artery beginning at the petrous segment with occlusion at the distal cavernous segment and reconstitution of the supraclinoid portion with attenuated enhancement. 3. Moderate stenosis of the right MCA M1 segment. 4. Mild stenosis of the right PCA P2 segment. 5. Mild atherosclerotic irregularity of the left PCA without high-grade stenosis.  Thrombectomy (02/21/24): IMPRESSION: 1. Suction and stent retriever assisted thrombectomy of left carotid terminus and M1 occlusion. 2. Post-treatment TICI score = 3.  MRI brain and MRA head wo contrast (02/21/24): MRI BRAIN: BRAIN AND VENTRICLES: Large early subacute infarct of the left MCA territory predominantly affecting the posterior region. Small amount of petechial hemorrhage in the infarcted region. Multifocal hyperintense T2-weighted signal within the cerebral white matter, most commonly due to chronic small vessel disease. Multiple old infarcts of the right MCA and PCA territories. Abnormal left ICA flow void. No mass or abnormal enhancement. No midline shift. No hydrocephalus. The sella is unremarkable. Normal flow voids.   ORBITS: No acute abnormality.   SINUSES AND MASTOIDS: No acute abnormality.   BONES AND SOFT TISSUES: Normal bone marrow signal. No acute soft tissue abnormality.   MRA HEAD: ANTERIOR CIRCULATION: There is no flow related enhancement within the left ICA on the time of flight angiographic images. No significant stenosis of the right internal carotid artery. There is normal flow related enhancement throughout the left MCA. No significant stenosis of the  right middle cerebral artery. No significant stenosis of the anterior cerebral arteries. No aneurysm.   POSTERIOR CIRCULATION: Unchanged severe stenosis of the right PCA P2 segment. No significant stenosis of the left posterior cerebral artery. No significant stenosis of the basilar artery. No significant stenosis of the vertebral arteries. No aneurysm.   IMPRESSION: 1. Large early subacute left MCA territory infarct, predominantly posterior, with small petechial hemorrhage (Heidelberg class 1). 2. Left ICA occlusion.  Left MCA is now patent. 3. Multiple chronic infarcts in the right MCA and PCA territories. 4. 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  Echocardiogram (02/21/24):  1. Left ventricular ejection fraction, by estimation, is 60 to 65%. Left  ventricular ejection fraction by 3D volume is 52 %. The left ventricle has  normal function. The left ventricle has no regional wall motion  abnormalities. Left ventricular diastolic   parameters were normal.   2. Right ventricular systolic function is normal. The right ventricular  size is normal. Tricuspid regurgitation signal is inadequate for assessing  PA pressure.   3. The mitral valve is normal in structure. No evidence of mitral valve  regurgitation. No evidence of mitral stenosis.   4. The aortic valve was not well visualized. Aortic valve regurgitation  is not visualized. No aortic stenosis is present.   5. The inferior vena cava is normal in size with greater than 50%  respiratory variability, suggesting right atrial pressure of 3 mmHg.   6. Increased flow velocities may be secondary to anemia, thyrotoxicosis,  hyperdynamic or high flow state.  LA normal in size  EEG (02/28/24): Background: The background consists of intermixed alpha and frontally predominant beta activities.  The posterior dominant rhythm is low voltage, but is seen with a frequency  of 9 Hz.  There is anterior shifting of the PDR with  drowsiness, but sleep is not recorded.   Photic stimulation: Physiologic driving is not performed   EEG Abnormalities: 1) low voltage PDR   Clinical Interpretation: This EEG revealed evidence of bilateral posterior quadrant dysfunction, consistent with the patient's known strokes. There was no seizure or seizure predisposition recorded on this study. Please note that lack of epileptiform activity on EEG does not preclude the possibility of epilepsy.   CT head wo contrast (02/28/24): IMPRESSION: 1. No acute intracranial abnormality. 2. Expected evolution of the large left MCA territory infarct. 3. Remote infarcts involving the posterior right frontal lobe, right parietal lobe, and right occipital lobe. 4. Stable remote lacunar infarcts of the right basal ganglia. 5. Atherosclerotic calcifications in the cavernous carotid arteries bilaterally. No hyperdense vessel.  Previously reviewed results: 07/09/22: HbA1c: 6.1 B12: 275 TSH: 2.33 CBC wnl CMP wnl   MRI/MRA brain wo contrast (02/26/2019): Per my read: Multifocal right hemispheric acute strokes in frontal, parietal, and occipital lobes including in precentral and postcentral gyri.   Radiology read: FINDINGS: MR HEAD FINDINGS   BRAIN: Multifocal acute ischemia within the right hemisphere, including along the precentral gyrus, postcentral gyrus, posterior right parietal lobe and right occipital lobe. There is no contralateral acute ischemia. There is petechial hemorrhage at the right parieto-occipital infarct site. There is mild cytotoxic edema at the ischemic sites. The white matter signal is normal for the patient's age. The cerebral and cerebellar volume are age-appropriate. There is no hydrocephalus. The midline structures are normal.   VASCULAR: The major intracranial arterial and venous sinus flow voids are normal.   SKULL AND UPPER CERVICAL SPINE: Calvarial bone marrow signal is normal. There is no skull base mass.  The visualized upper cervical spine and soft tissues are normal.   SINUSES/ORBITS: There are no fluid levels or advanced mucosal thickening. The mastoid air cells and middle ear cavities are free of fluid. The orbits are normal.   MR CIRCLE OF WILLIS FINDINGS   POSTERIOR CIRCULATION:   --Vertebral arteries: Normal V4 segments.   --Posterior inferior cerebellar arteries (PICA): Patent origins from the vertebral arteries.   --Anterior inferior cerebellar arteries (AICA): Patent origins from the basilar artery.   --Basilar artery: Normal.   --Superior cerebellar arteries: Normal.   --Posterior cerebral arteries: Normal. The right PCA is partially supplied by a small posterior communicating artery (p-comm).   ANTERIOR CIRCULATION:   --Intracranial internal carotid arteries: Severe stenosis of the distal right cavernous segment (series 7, image 90). Multifocal mild stenosis of the left cavernous and clinoid segments.   --Anterior cerebral arteries (ACA): Normal. Both A1 segments are present. Patent anterior communicating artery (a-comm).   --Middle cerebral arteries (MCA): Normal.   MRA NECK FINDINGS   Aortic arch: Normal 3 vessel aortic branching pattern. The visualized subclavian arteries are normal.   Right carotid system: Normal course and caliber without stenosis or evidence of dissection.   Left carotid system: Normal course and caliber without stenosis or evidence of dissection.   Vertebral arteries: Codominant. Vertebral artery origins are normal. Vertebral arteries are normal in course and caliber to the vertebrobasilar confluence without stenosis or evidence of dissection.   IMPRESSION: 1. Multifocal acute ischemia within the right hemisphere, including along the precentral gyrus, postcentral gyrus, posterior right parietal lobe and right occipital lobe. 2. Petechial hemorrhage at the right parieto-occipital infarct site. No mass effect. 3. Severe  stenosis of the distal right internal carotid artery cavernous  segment. Multifocal mild stenosis of the left cavernous and clinoid segments. 4. Normal MRA of the neck. No proximal embolic source is identified. Correlation with echocardiography may be helpful.   CTA head and neck (02/26/2019): FINDINGS: CTA NECK FINDINGS   Aortic arch: Standard branching. Included portions of the aortic arch demonstrate no evidence of dissection or aneurysm.   Right carotid system: CCA and ICA patent within the neck without significant stenosis (50% or greater). The proximal right ICA is somewhat tortuous. No significant atherosclerotic plaque.   Left carotid system: CCA and ICA patent within the neck without significant stenosis (50% or greater). Mild predominantly noncalcified plaque within the distal common carotid artery and at the bifurcation.   Vertebral arteries: The bilateral vertebral arteries are patent within the neck without significant stenosis. The left vertebral artery is slightly dominant.   Skeleton: Reversal of the expected cervical lordosis. Mild cervical spondylosis. No acute bony abnormality.   Other neck: No soft tissue neck mass or pathologically enlarged cervical chain lymph nodes. Thyroid unremarkable.   Upper chest: Paraseptal and central lobule emphysema within the imaged lung apices.   Review of the MIP images confirms the above findings   CTA HEAD FINDINGS   Anterior circulation:   The intracranial right internal carotid artery is patent. There is prominent soft and calcified plaque within the cavernous and paraclinoid right ICA. Resultant severe stenosis within the cavernous right ICA (series 9, image 84). Mild to moderate stenosis also present within the paraclinoid right ICA.   Calcified atherosclerotic plaque within the cavernous and paraclinoid left ICA. Mild-to-moderate stenosis within the cavernous left ICA with otherwise no more than mild luminal  narrowing.   The right middle and anterior cerebral arteries are patent without significant proximal stenosis.   The left middle and anterior cerebral arteries are patent without significant proximal stenosis.   No intracranial aneurysm is identified.   Posterior circulation:   The intracranial vertebral arteries are patent without significant stenosis.   The basilar artery is patent without significant stenosis.   The right posterior cerebral artery is patent without significant proximal stenosis.   The left posterior cerebral artery is patent. There are mild-to-moderate focal stenoses within the proximal and distal P2 left posterior cerebral artery.   Venous sinuses: Within limitations of contrast timing, no convincing thrombus.   Anatomic variants: None significant   Review of the MIP images confirms the above findings   IMPRESSION: CTA head:   1. 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 Confirmed severe stenosis within the cavernous right ICA. Additional sites of mild to moderate stenosis as described. 2. Mild-to-moderate focal stenoses within the proximal and distal P2 left posterior cerebral artery.   CTA neck:   Common carotid, internal carotid and vertebral arteries patent within the neck bilaterally without significant stenosis. Mild scattered atherosclerotic plaque within the bilateral carotid systems. No evidence of dissection or aneurysm.   CT cervical spine wo contrast (10/29/2018): FINDINGS: Alignment: Mild reversal of cervical lordosis centered at the level of C5, presumably positional. Alignment is otherwise anatomic.   Skull base and vertebrae: No acute fracture. No primary bone lesion or focal pathologic process.   Soft tissues and spinal canal: No prevertebral fluid or swelling. No visible canal hematoma.   Disc levels: Multilevel degenerative disc disease, most severe at C5-C6 and C6-C7. Mild multilevel  facet arthropathy.   Upper chest: Negative.   Other: None.   IMPRESSION: 1. No acute abnormality of the cervical spine. 2. Mild multilevel degenerative  disc disease and cervical spondylosis, as above.  ASSESSMENT: This is JAVIEN TESCH, a 63 y.o. 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 He received TNK and underwent left carotid terminus and M1 thromboectomy that achieved TICI 3. UDS was positive for cocaine and THC. His lipid panel was very elevated. Patient had not been taking his medications. 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 He is still smoking, but cut back significantly. He is reportedly not doing drugs or drinking anymore and now taking medications as prescribed. He has improved some with PT/OT and speech thus far. His significant asphasia and difficulty following commands limits his neurologic examination today. Stroke etiology is most likely large vessel disease due to poorly controlled risk factors.  Of note, I previously saw patient for hand tingling, found to have residuals of cervical radiculopathy and right ulnar neuropathy at the elbow by EMG on 09/12/22. This could not be evaluated well today due to issues with aphasia as above.  Plan: -Continue DAPT with aspirin  81 mg daily and plavix  75 mg daily until around 05/24/24 then stop plavix  and continue aspirin  81 mg daily alone -Continue atorvastatin  80 mg daily and zetia  10 mg daily -Continue PT/OT and speech therapy -Stroke warning signs discussed -Fall precautions discussed  Return to clinic in ~3 months  Total time spent reviewing records, interview, history/exam, documentation, and coordination of care on day of encounter:  60 min  Venetia Potters, MD

## 2024-03-23 ENCOUNTER — Encounter: Attending: Registered Nurse | Admitting: Registered Nurse

## 2024-03-24 DIAGNOSIS — E785 Hyperlipidemia, unspecified: Secondary | ICD-10-CM

## 2024-03-24 DIAGNOSIS — I69391 Dysphagia following cerebral infarction: Secondary | ICD-10-CM | POA: Diagnosis not present

## 2024-03-24 DIAGNOSIS — F17200 Nicotine dependence, unspecified, uncomplicated: Secondary | ICD-10-CM

## 2024-03-24 DIAGNOSIS — Z7982 Long term (current) use of aspirin: Secondary | ICD-10-CM

## 2024-03-24 DIAGNOSIS — Z9181 History of falling: Secondary | ICD-10-CM

## 2024-03-24 DIAGNOSIS — I69351 Hemiplegia and hemiparesis following cerebral infarction affecting right dominant side: Secondary | ICD-10-CM | POA: Diagnosis not present

## 2024-03-24 DIAGNOSIS — Z86718 Personal history of other venous thrombosis and embolism: Secondary | ICD-10-CM

## 2024-03-24 DIAGNOSIS — I69328 Other speech and language deficits following cerebral infarction: Secondary | ICD-10-CM | POA: Diagnosis not present

## 2024-03-24 DIAGNOSIS — I69318 Other symptoms and signs involving cognitive functions following cerebral infarction: Secondary | ICD-10-CM | POA: Diagnosis not present

## 2024-03-24 DIAGNOSIS — I10 Essential (primary) hypertension: Secondary | ICD-10-CM

## 2024-03-24 DIAGNOSIS — Z7902 Long term (current) use of antithrombotics/antiplatelets: Secondary | ICD-10-CM

## 2024-03-26 ENCOUNTER — Ambulatory Visit (INDEPENDENT_AMBULATORY_CARE_PROVIDER_SITE_OTHER): Admitting: Family

## 2024-03-26 ENCOUNTER — Encounter: Payer: Self-pay | Admitting: Family

## 2024-03-26 VITALS — BP 130/76 | HR 85 | Temp 98.0°F | Resp 18 | Ht 67.0 in | Wt 186.2 lb

## 2024-03-26 DIAGNOSIS — M5412 Radiculopathy, cervical region: Secondary | ICD-10-CM | POA: Insufficient documentation

## 2024-03-26 DIAGNOSIS — N522 Drug-induced erectile dysfunction: Secondary | ICD-10-CM | POA: Insufficient documentation

## 2024-03-26 DIAGNOSIS — I10 Essential (primary) hypertension: Secondary | ICD-10-CM

## 2024-03-26 DIAGNOSIS — R7303 Prediabetes: Secondary | ICD-10-CM | POA: Diagnosis not present

## 2024-03-26 DIAGNOSIS — B351 Tinea unguium: Secondary | ICD-10-CM | POA: Diagnosis not present

## 2024-03-26 DIAGNOSIS — R531 Weakness: Secondary | ICD-10-CM | POA: Diagnosis not present

## 2024-03-26 DIAGNOSIS — M4802 Spinal stenosis, cervical region: Secondary | ICD-10-CM | POA: Insufficient documentation

## 2024-03-26 DIAGNOSIS — M67912 Unspecified disorder of synovium and tendon, left shoulder: Secondary | ICD-10-CM | POA: Insufficient documentation

## 2024-03-26 DIAGNOSIS — F17209 Nicotine dependence, unspecified, with unspecified nicotine-induced disorders: Secondary | ICD-10-CM | POA: Diagnosis not present

## 2024-03-26 DIAGNOSIS — E782 Mixed hyperlipidemia: Secondary | ICD-10-CM

## 2024-03-26 DIAGNOSIS — R2681 Unsteadiness on feet: Secondary | ICD-10-CM | POA: Diagnosis not present

## 2024-03-26 DIAGNOSIS — C61 Malignant neoplasm of prostate: Secondary | ICD-10-CM | POA: Insufficient documentation

## 2024-03-26 DIAGNOSIS — L602 Onychogryphosis: Secondary | ICD-10-CM | POA: Diagnosis not present

## 2024-03-26 DIAGNOSIS — J449 Chronic obstructive pulmonary disease, unspecified: Secondary | ICD-10-CM | POA: Diagnosis not present

## 2024-03-26 DIAGNOSIS — E66811 Obesity, class 1: Secondary | ICD-10-CM | POA: Insufficient documentation

## 2024-03-26 DIAGNOSIS — J4530 Mild persistent asthma, uncomplicated: Secondary | ICD-10-CM

## 2024-03-26 DIAGNOSIS — R32 Unspecified urinary incontinence: Secondary | ICD-10-CM

## 2024-03-26 DIAGNOSIS — G44209 Tension-type headache, unspecified, not intractable: Secondary | ICD-10-CM | POA: Insufficient documentation

## 2024-03-26 DIAGNOSIS — Z9359 Other cystostomy status: Secondary | ICD-10-CM | POA: Insufficient documentation

## 2024-03-26 DIAGNOSIS — Z720 Tobacco use: Secondary | ICD-10-CM | POA: Insufficient documentation

## 2024-03-26 LAB — COMPREHENSIVE METABOLIC PANEL WITH GFR
AG Ratio: 1.7 (calc) (ref 1.0–2.5)
ALT: 16 U/L (ref 9–46)
AST: 15 U/L (ref 10–35)
Albumin: 4.3 g/dL (ref 3.6–5.1)
Alkaline phosphatase (APISO): 122 U/L (ref 35–144)
BUN: 10 mg/dL (ref 7–25)
CO2: 30 mmol/L (ref 20–32)
Calcium: 9.5 mg/dL (ref 8.6–10.3)
Chloride: 103 mmol/L (ref 98–110)
Creat: 0.84 mg/dL (ref 0.70–1.35)
Globulin: 2.6 g/dL (ref 1.9–3.7)
Glucose, Bld: 90 mg/dL (ref 65–99)
Potassium: 4.4 mmol/L (ref 3.5–5.3)
Sodium: 139 mmol/L (ref 135–146)
Total Bilirubin: 0.3 mg/dL (ref 0.2–1.2)
Total Protein: 6.9 g/dL (ref 6.1–8.1)
eGFR: 97 mL/min/1.73m2 (ref >=60)

## 2024-03-26 LAB — CBC WITH DIFFERENTIAL/PLATELET
Absolute Lymphocytes: 1716 {cells}/uL (ref 850–3900)
Absolute Monocytes: 446 {cells}/uL (ref 200–950)
Basophils Absolute: 28 {cells}/uL (ref 0–200)
Basophils Relative: 0.6 %
Eosinophils Absolute: 101 {cells}/uL (ref 15–500)
Eosinophils Relative: 2.2 %
HCT: 41.3 % (ref 38.5–50.0)
Hemoglobin: 14 g/dL (ref 13.2–17.1)
MCH: 31.9 pg (ref 27.0–33.0)
MCHC: 33.9 g/dL (ref 32.0–36.0)
MCV: 94.1 fL (ref 80.0–100.0)
MPV: 11 fL (ref 7.5–12.5)
Monocytes Relative: 9.7 %
Neutro Abs: 2309 {cells}/uL (ref 1500–7800)
Neutrophils Relative %: 50.2 %
Platelets: 226 Thousand/uL (ref 140–400)
RBC: 4.39 Million/uL (ref 4.20–5.80)
RDW: 13.1 % (ref 11.0–15.0)
Total Lymphocyte: 37.3 %
WBC: 4.6 Thousand/uL (ref 3.8–10.8)

## 2024-03-26 MED ORDER — NICOTINE 14 MG/24HR TD PT24: 14.0000 mg | MEDICATED_PATCH | Freq: Every day | TRANSDERMAL | 2 refills | Status: AC

## 2024-03-26 MED ORDER — ENSURE PLUS HIGH PROTEIN PO LIQD: 237.0000 mL | Freq: Two times a day (BID) | ORAL | 5 refills | Status: DC

## 2024-03-26 NOTE — Patient Instructions (Signed)
 1.Stop at check out & schedule Annual Wellness Visit.

## 2024-03-30 ENCOUNTER — Telehealth: Payer: Self-pay

## 2024-03-30 ENCOUNTER — Ambulatory Visit: Payer: Self-pay | Admitting: Family

## 2024-03-30 NOTE — Telephone Encounter (Signed)
 Copied from CRM [id number]. Topic: Clinical - Order For Equipment >> Mar 30, 2024 10:29 AM Farrel B wrote: Reason for CRM: Patient's spouse Ms. [name] called to check status of the DME requested, she states that she was concerned about the recliner. I advised her that the request for the supplies and the recliner are showing active, she stated she thought she seen where it was no longer available and needed clarification. Please call Ms. Vena [phone]

## 2024-03-30 NOTE — Telephone Encounter (Signed)
 Reviewed order in [employer] and ordered was cancelled due to a wheelchair being orders on 03/12/2024.  Waiting for response to see if we are able to send to different supplier.

## 2024-03-30 NOTE — Telephone Encounter (Signed)
 Contacted [name] pts wife and informed her of the order being cancelled please see order details below.  Informed her that currently we are trying to submit information through another supplier.

## 2024-03-31 ENCOUNTER — Encounter: Payer: Self-pay | Admitting: Neurology

## 2024-03-31 ENCOUNTER — Telehealth: Payer: Self-pay

## 2024-03-31 ENCOUNTER — Ambulatory Visit (INDEPENDENT_AMBULATORY_CARE_PROVIDER_SITE_OTHER): Admitting: Neurology

## 2024-03-31 VITALS — BP 122/83 | HR 80 | Ht 68.0 in | Wt 185.0 lb

## 2024-03-31 DIAGNOSIS — Z720 Tobacco use: Secondary | ICD-10-CM

## 2024-03-31 DIAGNOSIS — E785 Hyperlipidemia, unspecified: Secondary | ICD-10-CM

## 2024-03-31 DIAGNOSIS — I63512 Cerebral infarction due to unspecified occlusion or stenosis of left middle cerebral artery: Secondary | ICD-10-CM

## 2024-03-31 DIAGNOSIS — F1910 Other psychoactive substance abuse, uncomplicated: Secondary | ICD-10-CM | POA: Diagnosis not present

## 2024-03-31 DIAGNOSIS — M5010 Cervical disc disorder with radiculopathy, unspecified cervical region: Secondary | ICD-10-CM | POA: Diagnosis not present

## 2024-03-31 MED ORDER — ASPIRIN 81 MG PO TBEC: 81.0000 mg | DELAYED_RELEASE_TABLET | Freq: Every day | ORAL | 12 refills | Status: AC

## 2024-03-31 MED ORDER — EZETIMIBE 10 MG PO TABS: 10.0000 mg | ORAL_TABLET | Freq: Every day | ORAL | 3 refills | Status: AC

## 2024-03-31 MED ORDER — ATORVASTATIN CALCIUM 80 MG PO TABS: 80.0000 mg | ORAL_TABLET | Freq: Every day | ORAL | 3 refills | Status: AC

## 2024-03-31 MED ORDER — CLOPIDOGREL BISULFATE 75 MG PO TABS: 75.0000 mg | ORAL_TABLET | Freq: Every day | ORAL | 0 refills | Status: DC

## 2024-03-31 NOTE — Telephone Encounter (Signed)
   Spoke with [name] regarding patient reclining chair informed her that all updates will go to the phone number on file.

## 2024-03-31 NOTE — Telephone Encounter (Signed)
 My chart message sent

## 2024-03-31 NOTE — Telephone Encounter (Signed)
 Please schedule office visit appointment if symptoms are new or concerning for urinary tract infection.

## 2024-03-31 NOTE — Telephone Encounter (Signed)
 Copied from CRM [id number]. Topic: General - Other >> Mar 31, 2024 10:29 AM Debby BROCKS wrote: Reason for CRM: Deirdre Hoffman(speech pathologist)  from [facility] just wanted to notify Dinah that the patient has urinary urgency and frequency. He goes about hourly during the day. Does have a history of prostate cancer.  Callback if they wish to speak to Deirdre: [phone]

## 2024-03-31 NOTE — Patient Instructions (Addendum)
 I saw you today after your recent stroke.  -Continue with aspirin  81 mg daily and plavix  75 mg daily until around 05/24/24 then stop plavix  and continue aspirin  81 mg daily alone -Continue atorvastatin  80 mg daily and zetia  10 mg daily  Continue PT, OT, and speech therapy and do home exercises given.  If you have new difficulty speaking, face droop, numbness on one side of the body, weakness on one side of the body, or dizziness/imbalance, this could be the sign of a stroke. Don't wait, please call EMS and be evaluated at the nearest emergency room.  I will see you again around 3 months from now to re-examine you and check your progress.  Please let me know if you have any questions or concerns in the meantime.   The physicians and staff at [facility] are committed to providing excellent care. You may receive a survey requesting feedback about your experience at our office. We strive to receive very good responses to the survey questions. If you feel that your experience would prevent you from giving the office a very good  response, please contact our office to try to remedy the situation. We may be reached at [phone]. Thank you for taking the time out of your busy day to complete the survey.  Venetia Potters, MD [facility]  Preventing Falls at [facility] are common, often dreaded events in the lives of older people. Aside from the obvious injuries and even death that may result, fall can cause wide-ranging consequences including loss of independence, mental decline, decreased activity and mobility. Younger people are also at risk of falling, especially those with chronic illnesses and fatigue.  Ways to reduce risk for falling Examine diet and medications. Warm foods and alcohol dilate blood vessels, which can lead to dizziness when standing. Sleep aids, antidepressants and pain medications can also increase the likelihood of a fall.  Get a vision exam. Poor vision,  cataracts and glaucoma increase the chances of falling.  Check foot gear. Shoes should fit snugly and have a sturdy, nonskid sole and a broad, low heel  Participate in a physician-approved exercise program to build and maintain muscle strength and improve balance and coordination. Programs that use ankle weights or stretch bands are excellent for muscle-strengthening. Water  aerobics programs and low-impact Tai Chi programs have also been shown to improve balance and coordination.  Increase vitamin D intake. Vitamin D improves muscle strength and increases the amount of calcium  the body is able to absorb and deposit in bones.  How to prevent falls from common hazards Floors - Remove all loose wires, cords, and throw rugs. Minimize clutter. Make sure rugs are anchored and smooth. Keep furniture in its usual place.  Chairs -- Use chairs with straight backs, armrests and firm seats. Add firm cushions to existing pieces to add height.  Bathroom - Install grab bars and non-skid tape in the tub or shower. Use a bathtub transfer bench or a shower chair with a back support Use an elevated toilet seat and/or safety rails to assist standing from a low surface. Do not use towel racks or bathroom tissue holders to help you stand.  Lighting - Make sure halls, stairways, and entrances are well-lit. Install a night light in your bathroom or hallway. Make sure there is a light switch at the top and bottom of the staircase. Turn lights on if you get up in the middle of the night. Make sure lamps or light switches are within reach  of the bed if you have to get up during the night.  Kitchen - Install non-skid rubber mats near the sink and stove. Clean spills immediately. Store frequently used utensils, pots, pans between waist and eye level. This helps prevent reaching and bending. Sit when getting things out of lower cupboards.  Living room/ Bedrooms - Place furniture with wide spaces in between, giving enough room  to move around. Establish a route through the living room that gives you something to hold onto as you walk.  Stairs - Make sure treads, rails, and rugs are secure. Install a rail on both sides of the stairs. If stairs are a threat, it might be helpful to arrange most of your activities on the lower level to reduce the number of times you must climb the stairs.  Entrances and doorways - Install metal handles on the walls adjacent to the doorknobs of all doors to make it more secure as you travel through the doorway.  Tips for maintaining balance Keep at least one hand free at all times. Try using a backpack or fanny pack to hold things rather than carrying them in your hands. Never carry objects in both hands when walking as this interferes with keeping your balance.  Attempt to swing both arms from front to back while walking. This might require a conscious effort if Parkinson's disease has diminished your movement. It will, however, help you to maintain balance and posture, and reduce fatigue.  Consciously lift your feet off of the ground when walking. Shuffling and dragging of the feet is a common culprit in losing your balance.  When trying to navigate turns, use a U technique of facing forward and making a wide turn, rather than pivoting sharply.  Try to stand with your feet shoulder-length apart. When your feet are close together for any length of time, you increase your risk of losing your balance and falling.  Do one thing at a time. Don't try to walk and accomplish another task, such as reading or looking around. The decrease in your automatic reflexes complicates motor function, so the less distraction, the better.  Do not wear rubber or gripping soled shoes, they might catch on the floor and cause tripping.  Move slowly when changing positions. Use deliberate, concentrated movements and, if needed, use a grab bar or walking aid. Count 15 seconds between each movement. For example,  when rising from a seated position, wait 15 seconds after standing to begin walking.  If balance is a continuous problem, you might want to consider a walking aid such as a cane, walking stick, or walker. Once you've mastered walking with help, you might be ready to try it on your own again.

## 2024-04-01 ENCOUNTER — Ambulatory Visit: Payer: Self-pay

## 2024-04-01 NOTE — Progress Notes (Addendum)
 "  Provider: Kymberley Raz FNP-C   Nevia Henkin, Roxan BROCKS, NP  Patient Care Team: Linh Johannes, Roxan BROCKS, NP as PCP - General (Family Medicine) Ines Onetha NOVAK, MD as Consulting Physician (Neurology) Devere Lonni Righter, MD as Consulting Physician (Urology) Patrcia Cough, MD as Consulting Physician (Radiation Oncology) Delene Thersia PARAS as Social Worker  Extended Emergency Contact Information Primary Emergency Contact: [facility] Phone: [phone] Relation: Spouse Secondary Emergency Contact: Cleotilde Dorn [place], [place] [address] [place] Home Phone: [phone] Relation: Brother  Code Status:  Full Code  Goals of care: Advanced Directive information    03/31/2024   12:37 PM  Advanced Directives  Does Patient Have a Medical Advance Directive? No  Would patient like information on creating a medical advance directive? No - Patient declined     Chief Complaint  Patient presents with   Hospitalization Follow-up    Discussed the use of AI scribe software for clinical note transcription with the patient, who gave verbal consent to proceed.  History of Present Illness   Frank Fischer is a 63 year old male with a history of stroke, hypertension, and hyperlipidemia who presents for a hospital follow-up.  He was hospitalized from October 3rd to October 23rd due to a stroke, presenting with left-sided weakness attributed to a right-sided stroke. He has a history of multiple strokes, hypertension, hyperlipidemia, and prostate cancer treated with brachytherapy. He continues to experience left-sided weakness and uses a walker for mobility.  He is currently receiving physical, occupational, and speech therapy at home. A nurse from [place] visits him regularly. His medications include aspirin , atorvastatin  8 mg, Zetia  10 mg, Plavix , and lisinopril  5 mg. He also takes albuterol , folic acid , lorazepam  1 mg twice daily, a multivitamin, tamsulosin , and  thiamine . He has abstained from alcohol since October 3rd and has quit smoking, though he occasionally smokes a small piece of a cigarette.  He has a history of COPD but reports no recent coughing episodes. No shortness of breath or leg swelling. He experienced a fall after returning from the hospital while reaching for a remote control but did not sustain significant injuries. He uses pull-ups for urinary incontinence, changing them three to four times a day, and occasionally has bowel incontinence.  Recent lab work showed an A1c of 6.1 and a cholesterol level of 111. He had low potassium levels during his hospital stay, which were treated, and he requires follow-up lab work to monitor these levels.  He has overgrown toenails with fungal infection, causing foot pain, and requires a referral to a podiatrist for management. He has a hospital bed, wheelchair, commode, and rolling walker at home, and there is a request for an electric recliner to aid in his comfort and getting in and out of the chair.    Past Medical History:  Diagnosis Date   Arthritis    Cervical stenosis of spine    Chronic headaches    Dental caries    periodontal disease   Frequency of urination    History of cerebral infarction 11-28-2017  per pt never had symptoms   per MRI imaging 10-31-2016 2 small chronic lacunar infartions in the right frontal periventricular white matter , and chronic left medial orbital fracture (as seen 2014 MRI)   Hypertension    Noncompliance with medication regimen    Prostate cancer [facility]) urologist-  dr winter/  oncologist-  dr patrcia   dx 07-21-2017--- Stage T1c, Gleason 4+3,  PSA 13.40,  vol 49.7cc--  scheduled for radioactive seed implants 12-03-2017   Stroke [facility])    Past Surgical History:  Procedure Laterality Date   ABDOMINAL EXPLORATION SURGERY  age 33   per pt had Small Bowel Resection and Appendectomy   APPENDECTOMY     ARM SURGERY Left 1999   REPAIR INJURY-- ARM CUT ABOUT OFF    IR CATHETER TUBE CHANGE  10/28/2021   IR CT HEAD LTD  02/21/2024   IR GUIDED DRAIN W CATHETER PLACEMENT  09/03/2021   IR PERCUTANEOUS ART THROMBECTOMY/INFUSION INTRACRANIAL INC DIAG ANGIO  02/21/2024   IR US  GUIDANCE  09/03/2021   IR US  GUIDE VASC ACCESS RIGHT  02/21/2024   PROSTATE BIOPSY  07/21/2017  dr winter office   RADIOACTIVE SEED IMPLANT N/A 12/03/2017   Procedure: RADIOACTIVE SEED IMPLANT/BRACHYTHERAPY IMPLANT;  Surgeon: Devere Lonni Righter, MD;  Location: [facility];  Service: Urology;  Laterality: N/A;  ONLY NEEDS 120 MIN   RADIOLOGY WITH ANESTHESIA N/A 02/21/2024   Procedure: RADIOLOGY WITH ANESTHESIA;  Surgeon: Radiologist, Medication, MD;  Location: [facility] OR;  Service: Radiology;  Laterality: N/A;   SPACE OAR INSTILLATION N/A 12/03/2017   Procedure: SPACE OAR INSTILLATION;  Surgeon: Devere Lonni Righter, MD;  Location: [facility];  Service: Urology;  Laterality: N/A;   TOOTH EXTRACTION N/A 09/21/2018   Procedure: DENTAL EXTRACTIONS WITH ALVEOLIPLASTY;  Surgeon: Sheryle Hamilton, DDS;  Location: [facility] OR;  Service: Oral Surgery;  Laterality: N/A;    Allergies  Allergen Reactions   Penicillins Itching    Allergies as of 03/26/2024       Reactions   Penicillins Itching        Medication List        Accurate as of March 26, 2024 11:59 PM. If you have any questions, ask your nurse or doctor.          acetaminophen  325 MG tablet Commonly known as: TYLENOL  Take 1-2 tablets (325-650 mg total) by mouth every 4 (four) hours as needed for mild pain (pain score 1-3).   albuterol  108 (90 Base) MCG/ACT inhaler Commonly known as: VENTOLIN  HFA Inhale 2 puffs into the lungs every 6 (six) hours as needed for wheezing or shortness of breath.   aspirin  EC 81 MG tablet Take 1 tablet (81 mg total) by mouth daily. Swallow whole.   atorvastatin  80 MG tablet Commonly known as: LIPITOR  Take 1 tablet (80 mg total) by mouth daily.    CertaVite/Antioxidants Tabs Take 1 tablet by mouth daily.   clopidogrel  75 MG tablet Commonly known as: PLAVIX  Take 1 tablet (75 mg total) by mouth daily.   ezetimibe  10 MG tablet Commonly known as: Zetia  Take 1 tablet (10 mg total) by mouth daily.   feeding supplement Liqd Take 237 mLs by mouth 2 (two) times daily between meals.   folic acid  1 MG tablet Commonly known as: FOLVITE  Take 1 tablet (1 mg total) by mouth daily.   lisinopril  5 MG tablet Commonly known as: ZESTRIL  Take 1 tablet (5 mg total) by mouth daily.   LORazepam  1 MG tablet Commonly known as: ATIVAN  Take 1 tablet (1 mg total) by mouth 2 (two) times daily.   nicotine  14 mg/24hr patch Commonly known as: NICODERM CQ  - dosed in mg/24 hours Place 1 patch (14 mg total) onto the skin daily.   tamsulosin  0.4 MG Caps capsule Commonly known as: FLOMAX  Take 1 capsule (0.4 mg total) by mouth daily.   thiamine  100 MG tablet  Commonly known as: VITAMIN B1 Take 1 tablet (100 mg total) by mouth daily.               Durable Medical Equipment  (From admission, onward)           Start     Ordered   03/26/24 0000  For home use only DME Other see comment       Comments: 1. Incontinent supplies - Pulls and flat chucks and wipes use daily as needed for incontinence # 30 days supply with 11 refills. Dx : urine and bowel incontinence and Hx of CVA Weight : 186.2 lbs   Height : 5'7  Question:  Length of Need  Answer:  Lifetime   03/26/24 1051   03/26/24 0000  For home use only DME Other see comment       Comments: Conservation [employer], [employer]  Sig.Use daily to assist in getting in and out of the chair Dx: history of stroke with right sided hemiparesis  and COPD  Weight : 186.2 lbs   Height : 5'7  Height :  Question:  Length of Need  Answer:  Lifetime   03/26/24 1051            Review of Systems  Constitutional:  Negative for appetite change, chills, fatigue, fever and unexpected weight change.  HENT:   Negative for congestion, dental problem, ear discharge, ear pain, facial swelling, hearing loss, nosebleeds, postnasal drip, rhinorrhea, sinus pressure, sinus pain, sneezing, sore throat, tinnitus and trouble swallowing.   Eyes:  Negative for pain, discharge, redness, itching and visual disturbance.  Respiratory:  Negative for cough, chest tightness, shortness of breath and wheezing.   Cardiovascular:  Negative for chest pain, palpitations and leg swelling.  Gastrointestinal:  Negative for abdominal distention, abdominal pain, blood in stool, constipation, diarrhea, nausea and vomiting.  Endocrine: Negative for cold intolerance, heat intolerance, polydipsia, polyphagia and polyuria.  Genitourinary:  Negative for difficulty urinating, dysuria, flank pain, frequency and urgency.       Incontinent for urine wears adult size pull-ups  Musculoskeletal:  Positive for gait problem. Negative for arthralgias, back pain, joint swelling, myalgias, neck pain and neck stiffness.  Skin:  Negative for color change, pallor, rash and wound.  Neurological:  Positive for weakness. Negative for dizziness, syncope, speech difficulty, light-headedness, numbness and headaches.       Left-sided weakness  Hematological:  Does not bruise/bleed easily.  Psychiatric/Behavioral:  Negative for agitation, behavioral problems, confusion, hallucinations, self-injury, sleep disturbance and suicidal ideas. The patient is not nervous/anxious.     Immunization History  Administered Date(s) Administered   Janssen (J&J) SARS-COV-2 Vaccination 08/26/2019   Tdap 07/30/2022   Pertinent  Health Maintenance Due  Topic Date Due   Colonoscopy  Never done   Influenza Vaccine  05/12/2024 (Originally 12/19/2023)      03/12/2023   10:06 AM 07/22/2023    1:49 PM 03/26/2024    9:38 AM 03/26/2024    9:39 AM 03/31/2024   12:36 PM  Fall Risk  Falls in the past year? 1 0 0 0 1  Was there an injury with Fall? 0 0 0  0  Fall Risk Category  Calculator 1 0 0  1  Patient at Risk for Falls Due to History of fall(s) No Fall Risks No Fall Risks  History of fall(s)  Fall risk Follow up  Falls evaluation completed;Education provided;Falls prevention discussed Falls evaluation completed  Falls evaluation completed   Functional Status Survey:  Vitals:   03/26/24 0944  BP: 130/76  Pulse: 85  Resp: 18  Temp: 98 F (36.7 C)  SpO2: 99%  Weight: 186 lb 3.2 oz (84.5 kg)  Height: 5' 7 (1.702 m)   Body mass index is 29.16 kg/m. Physical Exam  VITALS: T- 98.0, P- 85, BP- 130/76, SaO2- 99% MEASUREMENTS: Height- 5'7, Weight- 186.2. GENERAL: Alert, cooperative, well developed, no acute distress HEENT: Normocephalic, normal oropharynx, moist mucous membranes, no sinus tenderness CHEST: Clear to auscultation bilaterally, no wheezes, rhonchi, or crackles CARDIOVASCULAR: Normal heart rate and rhythm, S1 and S2 normal without murmurs ABDOMEN: Soft, non-tender, non-distended, without organomegaly, normal bowel sounds EXTREMITIES: No cyanosis or edema, fungal infection on toenails, toenails very long and thick, pain in the head of the foot, bunion starting to develop, pungent odor from feet NEUROLOGICAL: Cranial nerves grossly intact, moves all extremities left-sided weakness    Labs reviewed: Recent Labs    02/26/24 0425 02/28/24 0648 03/01/24 0220 03/04/24 0621 03/08/24 0628 03/11/24 0449 03/26/24 1055  NA 139   < > 138   < > 138 138 139  K 3.5   < > 3.9   < > 3.6 4.1 4.4  CL 104   < > 105   < > 103 107 103  CO2 22   < > 18*   < > 26 22 30   GLUCOSE 94   < > 87   < > 107* 96 90  BUN 9   < > 15   < > 9 11 10   CREATININE 1.15   < > 0.99   < > 1.02 1.01 0.84  CALCIUM  8.8*   < > 8.8*   < > 9.0 8.8* 9.5  MG 2.1  --  2.5*  --   --   --   --   PHOS 4.8*  --   --   --   --   --   --    < > = values in this interval not displayed.   Recent Labs    02/21/24 0035 02/28/24 0648 02/29/24 0556 03/26/24 1055  AST 20 32 33 15  ALT  23 28 29 16   ALKPHOS 89 105 110  --   BILITOT 0.2 0.8 0.8 0.3  PROT 6.9 7.2 7.2 6.9  ALBUMIN 4.0 3.7 3.6  --    Recent Labs    02/23/24 0646 02/26/24 0425 02/28/24 0648 02/29/24 0556 03/08/24 0628 03/11/24 0449 03/26/24 1055  WBC 5.9   < > 5.9   < > 4.3 4.8 4.6  NEUTROABS 4.1  --  4.0  --   --   --  2,309  HGB 13.6   < > 16.1   < > 14.9 14.4 14.0  HCT 39.7   < > 48.2   < > 44.2 43.0 41.3  MCV 95.7   < > 95.6   < > 93.8 95.8 94.1  PLT 199   < > 282   < > 266 275 226   < > = values in this interval not displayed.   Lab Results  Component Value Date   TSH 2.62 03/07/2023   Lab Results  Component Value Date   HGBA1C 5.7 (H) 02/21/2024   Lab Results  Component Value Date   CHOL 159 02/21/2024   HDL 41 02/21/2024   LDLCALC 92 02/21/2024   TRIG 130 02/21/2024   CHOLHDL 3.9 02/21/2024    Significant Diagnostic Results in last 30 days:  No results found.  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 Currently undergoing rehabilitation with physical, occupational, and speech therapy at home. No worsening of symptoms reported. - Continue physical, occupational, and speech therapy at home - Follow up with neurologist on November 12th  Essential hypertension Blood pressure well-controlled at 130/76 mmHg. Continues on lisinopril  5 mg daily. - Continue lisinopril  5 mg daily - Monitor blood pressure regularly  Mixed hyperlipidemia Cholesterol level at 111 mg/dL, slightly above target of below 100 mg/dL due to stroke history. Continues on atorvastatin  and Zetia . - Continue atorvastatin  and Zetia  - Monitor cholesterol levels  Prediabetes A1c at 5.7%, indicating prediabetes. Previous A1c was 6.3% a year ago. Advised to avoid sugary foods and drinks to prevent progression to diabetes. - Encouraged regular exercise post-meal to utilize excess sugar - Advised to avoid sugary foods and  drinks  Benign prostatic hyperplasia Continues on tamsulosin  for management of symptoms. - Continue tamsulosin   Urinary incontinence Uses pull-ups for management. Requires additional supplies for incontinence management. 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 Requires professional care for toenail management. - Referred to podiatrist for toenail care  Unsteadiness on feet and history of falls Recent fall reported. Uses walker for mobility. Advised to use walker consistently to prevent falls. - Continue using walker consistently for mobility  Nicotine  dependence, in remission No current smoking reported. Previously refused nicotine  patch. Advised to avoid smoking to prevent worsening of stroke symptoms. - Advised to avoid smoking  Chronic obstructive pulmonary disease No recent exacerbations reported. Continues on albuterol  as needed. - Continue albuterol  as needed  Family/ staff Communication: Reviewed plan of care with patient verbalized understanding  Labs/tests ordered:  - CBC with Differential/Platelet - CMP with eGFR([facility])   Next Appointment : Return in about 4 months (around 07/24/2024) for medical mangement of chronic issues.SABRA   Spent 30 minutes of Face to face and non-face to face with patient  >50% time spent counseling; reviewing medical record; tests; labs; documentation and developing future plan of care.   Roxan JAYSON Plough, NP   "

## 2024-04-01 NOTE — Telephone Encounter (Signed)
  FYI Only or Action Required?: Action required by provider: update on patient condition.  Patient was last seen in primary care on 03/26/2024 by Ngetich, Roxan BROCKS, NP.  Called Nurse Triage reporting Constipation.  Symptoms began today.  Interventions attempted: Dietary changes.- prune juice, apple juice  Symptoms are: unchanged.  Triage Disposition: See PCP When Office is Open (Within 3 Days)  Patient/caregiver understands and will follow disposition?: Yes Copied from CRM #[id number]. Topic: Clinical - Medical Advice >> Apr 01, 2024  1:08 PM Fredrica W wrote: Reason for CRM: [name] called. States patient was recently seen but has been constipated the last two days. She can see that it is right there and he has been trying but it is not coming out. Would like to know what he can take with the medications he is currently taking to help with constipations. Thank You Reason for Disposition  Unable to have a bowel movement (BM) without laxative or enema  Answer Assessment - Initial Assessment Questions 1. STOOL PATTERN OR FREQUENCY: How often do you have a bowel movement (BM)?  (Normal range: 3 times a day to every 3 days)  When was your last BM?       Normally has daily BM 2. STRAINING: Do you have to strain to have a BM?      Normally, not 3. ONSET: When did the constipation begin?     Has not had BM in two days 4. RECTAL PAIN: Does your rectum hurt when the stool comes out? If Yes, ask: Do you have hemorrhoids? How bad is the pain?  (Scale 1-10; or mild, moderate, severe)     denies  6. BLOOD ON STOOLS: Has there been any blood on the toilet tissue or on the surface of the BM? If Yes, ask: When was the last time?     denies 7. CHRONIC CONSTIPATION: Is this a new problem for you?  If No, ask: How long have you had this problem? (days, weeks, months)      denies 8. CHANGES IN DIET OR HYDRATION: Have there been any recent changes in your diet? How much fluids are you  drinking on a daily basis?  How much have you had to drink today?      9. MEDICINES: Have you been taking any new medicines? Are you taking any narcotic pain medicines? (e.g., Dilaudid , morphine , Percocet, Vicodin)     denies 10. LAXATIVES: Have you been using any stool softeners, laxatives, or enemas?  If Yes, ask What are you using, how often, and when was the last time?       denies 11. ACTIVITY:  How much walking do you do every day?  Has your activity level decreased in the past week?        Recent stroke, decreased activity 12. CAUSE: What do you think is causing the constipation?        unsure 13. MEDICAL HISTORY: Do you have a history of hemorrhoids, rectal fissures, rectal surgery, or rectal abscess?         denies 14. OTHER SYMPTOMS: Do you have any other symptoms? (e.g., abdomen pain, bloating, fever, vomiting)       Denies  Protocols used: Constipation-A-AH

## 2024-04-01 NOTE — Telephone Encounter (Signed)
 Recommend over the counter dulcolax suppository x 1 dose may repeat x 1 if still no bowel movement. Also take Senokot - S one  tablet by mouth daily for constipation.Increase water  intake and fiber in the diet.  Follow up at the office or go to ED if still unable to move bowel after taking medication.

## 2024-04-05 ENCOUNTER — Telehealth: Payer: Self-pay | Admitting: *Deleted

## 2024-04-05 NOTE — Telephone Encounter (Signed)
 Called and spoke with Vena, She has called [employer] and they are delivering a new chair.

## 2024-04-05 NOTE — Telephone Encounter (Signed)
 Copied from CRM #[id number]. Topic: General - Other >> Apr 02, 2024  3:18 PM Cherylann RAMAN wrote: Reason for CRM: Vena, friend, stated that [employer] brought a Animator for the patient. She states the chair has someone's name on the back of the chair, the chair is torn on the back where the handle is, the chair is unclean, the chair's condition is barely useable. Please contact [name] at [phone] for additional information.

## 2024-04-12 ENCOUNTER — Encounter: Payer: Self-pay | Admitting: Podiatry

## 2024-04-12 ENCOUNTER — Ambulatory Visit (INDEPENDENT_AMBULATORY_CARE_PROVIDER_SITE_OTHER): Admitting: Podiatry

## 2024-04-12 ENCOUNTER — Telehealth: Payer: Self-pay

## 2024-04-12 DIAGNOSIS — B351 Tinea unguium: Secondary | ICD-10-CM | POA: Diagnosis not present

## 2024-04-12 DIAGNOSIS — I639 Cerebral infarction, unspecified: Secondary | ICD-10-CM

## 2024-04-12 DIAGNOSIS — M79675 Pain in left toe(s): Secondary | ICD-10-CM

## 2024-04-12 DIAGNOSIS — M79674 Pain in right toe(s): Secondary | ICD-10-CM | POA: Diagnosis not present

## 2024-04-12 MED ORDER — LISINOPRIL 5 MG PO TABS: 5.0000 mg | ORAL_TABLET | Freq: Every day | ORAL | 1 refills | Status: AC

## 2024-04-12 NOTE — Telephone Encounter (Signed)
 Spoke with [name] and she stated the rx for lisinopril  40 mg has Dinah's name on it. [name] would like to confirm what dose of Lisinopril  patient is to take and if it is the 5 mg a new rx needs to be submitted.  [name] emphasized that Bailen has not taken the 40 mg tablet.

## 2024-04-12 NOTE — Telephone Encounter (Signed)
 Copied from CRM [id number]. Topic: Clinical - Prescription Issue >> Apr 12, 2024 10:26 AM Chiquita SQUIBB wrote: Reason for CRM: Patients friend is calling in stating that when they went to the pharmacy to pick up the pharmacy they had the lisinopril  (ZESTRIL ) 5 MG tablet as 40 MG. She is unsure if that is correct. Please advise [name] at [phone]. Patient has one of the 5 MG left.

## 2024-04-12 NOTE — Progress Notes (Signed)
 This patient returns to my office for at risk foot care.  This patient requires this care by a professional since this patient will be at risk due to having  CVA.  This patient is unable to cut nails himself since the patient cannot reach his nails.These nails are painful walking and wearing shoes.  He presents to the office with male caregiver.This patient presents for at risk foot care today.  General Appearance  Alert, conversant and in no acute stress.  Vascular  Dorsalis pedis and posterior tibial  pulses are palpable  bilaterally.  Capillary return is within normal limits  bilaterally. Temperature is within normal limits  bilaterally.  Neurologic  Senn-Weinstein monofilament wire test within normal limits  bilaterally. Muscle power within normal limits bilaterally.  Nails Thick disfigured discolored nails with subungual debris  from hallux to fifth toes bilaterally. No evidence of bacterial infection or drainage bilaterally.  Orthopedic  No limitations of motion  feet .  No crepitus or effusions noted.  No bony pathology or digital deformities noted.  Skin  normotropic skin with no porokeratosis noted bilaterally.  No signs of infections or ulcers noted.     Onychomycosis  Pain in right toes  Pain in left toes  Consent was obtained for treatment procedures.   Mechanical debridement of nails 1-5  bilaterally performed with a nail nipper.  Filed with dremel without incident.    Return office visit  3 months                    Told patient to return for periodic foot care and evaluation due to potential at risk complications.   Cordella Bold DPM

## 2024-04-12 NOTE — Telephone Encounter (Signed)
 According to recent visit 03/26/2024 patient was advised to continue with Lisinopril  5 mg tablet daily.see chart.No change was made on medication.might need to verify with pharmacy which dose was given. Will resend lisinopril   5 mg tablet

## 2024-04-21 ENCOUNTER — Telehealth: Payer: Self-pay

## 2024-04-21 ENCOUNTER — Telehealth: Payer: Self-pay | Admitting: Neurology

## 2024-04-21 DIAGNOSIS — Z8673 Personal history of transient ischemic attack (TIA), and cerebral infarction without residual deficits: Secondary | ICD-10-CM

## 2024-04-21 NOTE — Telephone Encounter (Signed)
 Frank Fischer a therapy from [facility], stated that pt had  a stroke in October of this year. Pt has suffered ride side vision neglect. [name] has request a referral from pt's  PCP to get an appointment to check on the vision. Please call. Thanks

## 2024-04-21 NOTE — Telephone Encounter (Unsigned)
 Copied from CRM [id number]. Topic: Referral - Request for Referral >> Apr 21, 2024 10:57 AM Diannia H wrote: Did the patient discuss referral with their provider in the last year? Yes (If No - schedule appointment) (If Yes - send message)  Appointment offered? No  Type of order/referral and detailed reason for visit: Eye Doctor  Preference of office, provider, location: Eye Doctor  If referral order, have you been seen by this specialty before? No (If Yes, this issue or another issue? When? Where?  Can we respond through MyChart? Yes  Frank Fischer is calling from [facility] and stated that the patient will need to see an eye doctor for right eye neglect. Could you assist? Callback number is [phone] and can leave a voicemail.

## 2024-04-21 NOTE — Telephone Encounter (Signed)
 Ophthalmology referral ordered.

## 2024-04-21 NOTE — Telephone Encounter (Signed)
 Called and reported per Dr Leigh s recommendations to   Frank Fischer she understood.

## 2024-04-22 NOTE — Telephone Encounter (Signed)
 Patient has been notified about the referral for the eye doctor and has no questions at this time.

## 2024-04-27 ENCOUNTER — Telehealth: Payer: Self-pay

## 2024-04-27 MED ORDER — THIAMINE HCL 100 MG PO TABS: 100.0000 mg | ORAL_TABLET | Freq: Every day | ORAL | 1 refills | Status: AC

## 2024-04-27 NOTE — Telephone Encounter (Signed)
 Copied from CRM [id number]. Topic: Clinical - Medication Question >> Apr 27, 2024 11:27 AM Chiquita SQUIBB wrote: Reason for CRM: Deirdre is calling in to let the doctor know the patient no longer takes ensure and also that the patient currently takes Vitamin B one and they have to pay out of pocket and she is wondering if the doctor can make it a prescription so they can get it cheaper for the patient. Her call back number [phone]  Please advise, not sure what dose of B1 to add to medication list. Patient would like a rx sent to see if insurance will cover

## 2024-04-27 NOTE — Progress Notes (Signed)
 [facility] Worker Note Stroke Post Discharge Follow-Up  Frank Fischer [id number]   Contact Type:    Encounter Date: 04/27/2024  Outreach Project:    Managed Medicaid Plan Participant:   PCP:   Payor Status:        Past Medical History:  Diagnosis Date   Arthritis    Cervical stenosis of spine    Chronic headaches    Dental caries    periodontal disease   Frequency of urination    History of cerebral infarction 11-28-2017  per pt never had symptoms   per MRI imaging 10-31-2016 2 small chronic lacunar infartions in the right frontal periventricular white matter , and chronic left medial orbital fracture (as seen 2014 MRI)   Hypertension    Noncompliance with medication regimen    Prostate cancer [facility]) urologist-  dr winter/  oncologist-  dr patrcia   dx 07-21-2017--- Stage T1c, Gleason 4+3,  PSA 13.40,  vol 49.7cc--  scheduled for radioactive seed implants 12-03-2017   Stroke [facility])    Social History   Substance and Sexual Activity  Alcohol Use Yes   Alcohol/week: 21.0 standard drinks of alcohol   Types: 21 Cans of beer per week   Comment: 1-2 12oz  beers every evening   Social History   Substance and Sexual Activity  Drug Use Yes   Types: Marijuana   Comment:  per pt uses once week   Social History   Tobacco Use  Smoking Status Every Day   Current packs/day: 0.25   Average packs/day: 0.3 packs/day for 40.0 years (10.0 ttl pk-yrs)   Types: Cigarettes  Smokeless Tobacco Never    SDOH Screenings   Food Insecurity: No Food Insecurity (02/28/2024)  Housing: Low Risk  (02/28/2024)  Transportation Needs: No Transportation Needs (02/28/2024)  Utilities: Not At Risk (02/28/2024)  Depression (PHQ2-9): Low Risk  (03/26/2024)  Financial Resource Strain: Medium Risk (09/13/2022)  Social Connections: Moderately Isolated (02/28/2024)  Tobacco Use: High Risk (04/12/2024)   Referrals (if applicable):        Education:    Limiting Factors:     Follow-up:      Follow-up Type:      Cherise LOISE Finder, NT

## 2024-04-27 NOTE — Telephone Encounter (Signed)
 Patients daughter is aware rx sent for b1.   Please advise on question to change lorazepam  rx

## 2024-04-27 NOTE — Telephone Encounter (Signed)
 Vitamin B1 Prescription sent to pharmacy.

## 2024-04-27 NOTE — Telephone Encounter (Signed)
 Copied from CRM #[id number]. Topic: Clinical - Prescription Issue >> Apr 27, 2024 11:18 AM Mercer PEDLAR wrote: Reason for CRM: [facility] stating that patient has been prescribed LORazepam  (ATIVAN ) 1 MG tablet every 12 hours. She is requesting to change prescription to 12 hours PRN.   [facility] - [place], [place] - [address] [place] [address] Phone: [phone] Fax: [phone]

## 2024-05-04 LAB — OPHTHALMOLOGY REPORT-SCANNED

## 2024-05-10 ENCOUNTER — Other Ambulatory Visit: Payer: Self-pay | Admitting: Family

## 2024-05-11 ENCOUNTER — Telehealth: Payer: Self-pay

## 2024-05-11 NOTE — Telephone Encounter (Signed)
 Copied from CRM #[id number]. Topic: Clinical - Home Health Verbal Orders >> May 11, 2024 11:41 AM Miquel SAILOR wrote: Caller/Agency: Vickie from [facility] Callback Number: [phone] Service Requested: Speech Therapy Frequency: 1 once 3 week 0 week 1 (Due to Stroke) Any new concerns about the patient? No

## 2024-05-11 NOTE — Telephone Encounter (Signed)
 Call returned to [facility] and verbal orders authorized per [employer] standing order

## 2024-05-19 DIAGNOSIS — I69318 Other symptoms and signs involving cognitive functions following cerebral infarction: Secondary | ICD-10-CM | POA: Diagnosis not present

## 2024-05-19 DIAGNOSIS — I69320 Aphasia following cerebral infarction: Secondary | ICD-10-CM | POA: Diagnosis not present

## 2024-05-19 DIAGNOSIS — I69322 Dysarthria following cerebral infarction: Secondary | ICD-10-CM | POA: Diagnosis not present

## 2024-05-19 DIAGNOSIS — Z7982 Long term (current) use of aspirin: Secondary | ICD-10-CM | POA: Diagnosis not present

## 2024-05-19 DIAGNOSIS — E785 Hyperlipidemia, unspecified: Secondary | ICD-10-CM | POA: Diagnosis not present

## 2024-05-19 DIAGNOSIS — Z7902 Long term (current) use of antithrombotics/antiplatelets: Secondary | ICD-10-CM | POA: Diagnosis not present

## 2024-05-19 DIAGNOSIS — I69328 Other speech and language deficits following cerebral infarction: Secondary | ICD-10-CM | POA: Diagnosis not present

## 2024-05-19 DIAGNOSIS — I10 Essential (primary) hypertension: Secondary | ICD-10-CM | POA: Diagnosis not present

## 2024-05-19 DIAGNOSIS — F17200 Nicotine dependence, unspecified, uncomplicated: Secondary | ICD-10-CM | POA: Diagnosis not present

## 2024-05-19 DIAGNOSIS — I69391 Dysphagia following cerebral infarction: Secondary | ICD-10-CM | POA: Diagnosis not present

## 2024-05-19 DIAGNOSIS — I69351 Hemiplegia and hemiparesis following cerebral infarction affecting right dominant side: Secondary | ICD-10-CM | POA: Diagnosis not present

## 2024-05-19 DIAGNOSIS — Z86718 Personal history of other venous thrombosis and embolism: Secondary | ICD-10-CM | POA: Diagnosis not present

## 2024-05-25 ENCOUNTER — Telehealth: Payer: Self-pay

## 2024-05-25 NOTE — Telephone Encounter (Signed)
 Call returned to [facility] and verbal orders authorized per [employer] standing order

## 2024-05-25 NOTE — Telephone Encounter (Signed)
 Copied from CRM [id number]. Topic: Clinical - Home Health Verbal Orders >> May 25, 2024 12:39 PM Chiquita SQUIBB wrote: Caller/Agency: Signe with [facility] Number: [phone] Service Requested: Physical Therapy Frequency: 1 time a week for 4 weeks  Any new concerns about the patient? No- Patient is not currently taking the Ensure due to not being able to get it due to money and transportation to the pharmacy.

## 2024-05-25 NOTE — Telephone Encounter (Signed)
 Noted.Increase heart healthy protein in the diet if unable to obtain insurance.

## 2024-06-02 ENCOUNTER — Telehealth: Payer: Self-pay | Admitting: Family

## 2024-06-02 DIAGNOSIS — J449 Chronic obstructive pulmonary disease, unspecified: Secondary | ICD-10-CM | POA: Insufficient documentation

## 2024-06-02 DIAGNOSIS — R531 Weakness: Secondary | ICD-10-CM | POA: Insufficient documentation

## 2024-06-02 DIAGNOSIS — R32 Unspecified urinary incontinence: Secondary | ICD-10-CM | POA: Insufficient documentation

## 2024-06-02 DIAGNOSIS — R2681 Unsteadiness on feet: Secondary | ICD-10-CM | POA: Insufficient documentation

## 2024-06-02 NOTE — Telephone Encounter (Signed)
 The patient's spouse, Moses Dutton, dropped off [facility]/CAP/DA forms to be filled out by the provider. She stated she was told it is time sensitive and asked for forms to be faxed, and she would like to pick up a copy.   Forms given to CI.

## 2024-06-02 NOTE — Telephone Encounter (Signed)
 Paperwork completed And signed placed on outgoing faxes box.  Please fax as requested.  POA make pick up original copies.

## 2024-06-02 NOTE — Telephone Encounter (Signed)
 Demographic information filled in, attached medication & problem list. Placed in providers review and sign folder

## 2024-06-03 NOTE — Telephone Encounter (Signed)
 Noted

## 2024-06-03 NOTE — Telephone Encounter (Signed)
 Forms have been faxed and confirmed received. Patient was left a voicemail with this information and adviced a copy is ready for pick up.

## 2024-06-07 ENCOUNTER — Telehealth: Payer: Self-pay

## 2024-06-07 NOTE — Patient Outreach (Signed)
 First telephone outreach attempt to obtain mRS. No answer. Left message for returned call.  [facility]  Population Health Care Management Assistant  Direct Dial: [phone]  Fax: [phone] Website: [email or web address]

## 2024-06-08 ENCOUNTER — Other Ambulatory Visit: Payer: Self-pay | Admitting: Neurology

## 2024-06-08 DIAGNOSIS — I63512 Cerebral infarction due to unspecified occlusion or stenosis of left middle cerebral artery: Secondary | ICD-10-CM

## 2024-06-10 ENCOUNTER — Telehealth: Payer: Self-pay

## 2024-06-10 NOTE — Patient Outreach (Signed)
 Telephone outreach to patient's Wife to obtain mRS was successfully completed. MRS= 5  Shereen Gin [facility] VBCI Assistant Direct Dial: [phone]  Fax: [phone] Website: [email or web address]

## 2024-06-16 ENCOUNTER — Ambulatory Visit: Payer: Self-pay

## 2024-06-16 NOTE — Telephone Encounter (Signed)
 FYI Only or Action Required?: FYI only for provider: appointment scheduled on 06/17/24.  Patient was last seen in primary care on 03/26/2024 by Ngetich, Roxan BROCKS, NP.  Called Nurse Triage reporting Neck Pain.  Symptoms began several days ago.  Interventions attempted: Rest, hydration, or home remedies.  Symptoms are: unchanged.  Triage Disposition: See Today or Tomorrow in Office (overriding See PCP Within 2 Weeks)  Patient/caregiver understands and will follow disposition?: Yes  Reason for Disposition  [1] Age > 50 AND [2] no history of prior similar neck pain  Answer Assessment - Initial Assessment Questions Pt c/o neck stiffness and 4-5/10 pain when he tried to lay down. States has been happening x 2 days. Pain is primarily left sided. Denies recent trauma, exercise or lifting. Scheduled next day, will call with any changes. Is going to take tylenol  and try a different pillow.  1. ONSET: When did the pain begin?      2 days 2. LOCATION: Where does it hurt?      Left side of neck 3. PATTERN Does the pain come and go, or has it been constant since it started?      Worse when laying down 4. SEVERITY: How bad is the pain?  (Scale 0-10; or none or slight stiffness, mild, moderate, severe)     4-5/10 5. RADIATION: Does the pain go anywhere else, shoot into your arms?     Denies 6. CORD SYMPTOMS: Any weakness or numbness of the arms or legs?     Not above baseline 7. CAUSE: What do you think is causing the neck pain?     Unknown 8. NECK OVERUSE: Any recent activities that involved turning or twisting the neck?     Denies 9. OTHER SYMPTOMS: Do you have any other symptoms? (e.g., headache, fever, chest pain, difficulty breathing, neck swelling)     Denies  Protocols used: Neck Pain or Stiffness-A-AH  Message from [place] W sent at 06/16/2024  1:59 PM EST  Summary: Neck pain   Reason for Triage: Neck pain 4/10

## 2024-06-16 NOTE — Telephone Encounter (Signed)
 Will address concerns during visit tomorrow.

## 2024-06-17 ENCOUNTER — Ambulatory Visit: Admitting: Family

## 2024-06-18 ENCOUNTER — Telehealth: Payer: Self-pay

## 2024-06-18 NOTE — Telephone Encounter (Signed)
 Copied from CRM [id number]. Topic: Clinical - Home Health Verbal Orders >> Jun 18, 2024  1:07 PM DeAngela L wrote: Caller/Agency: Signe Eastern Physical Therapist with [facility]  Callback Number: [phone] secured line  Service Requested: Physical Therapy Any new concerns about the patient?No concerns The Patient was discharged from home health today they had a no call no show at planned visit today, Physical Therapist would recommend Outpatient Patient Physical Therapy if the patient is interested

## 2024-06-18 NOTE — Telephone Encounter (Signed)
 Left detail secured voicemail for Signe Eastern from [facility] given permission to the okay for the Home Health Verbal Orders for through our [facility] policy.

## 2024-06-22 ENCOUNTER — Telehealth: Payer: Self-pay

## 2024-06-22 NOTE — Telephone Encounter (Signed)
 Noted.

## 2024-06-22 NOTE — Telephone Encounter (Signed)
 Patient has an appointment tomorrow as requested by Ngetich, Dinah C, NP

## 2024-06-22 NOTE — Telephone Encounter (Signed)
 Please schedule for a face to face visit to order Physical Therapy

## 2024-06-22 NOTE — Telephone Encounter (Signed)
 Copied from CRM [id number]. Topic: Clinical - Home Health Verbal Orders >> Jun 21, 2024  1:36 PM Carrielelia G wrote: Caller/Agency: Signe Eastern with [facility] Number: [phone] Service Requested: Physical Therapy   Mr. Eastern is calling back to give clarification. Patient is needing Outpatient physical therapy and not Home Health physical therapy.   Patient will need to seek out an Outpatient facility or Provider would need to. Any questions please call Mr. Eastern

## 2024-06-23 ENCOUNTER — Encounter: Admitting: Family

## 2024-06-23 NOTE — Progress Notes (Signed)
   This encounter was created in error - please disregard. No show

## 2024-06-24 ENCOUNTER — Encounter: Payer: Self-pay | Admitting: Family

## 2024-06-24 ENCOUNTER — Ambulatory Visit: Admitting: Family

## 2024-06-24 VITALS — BP 126/78 | HR 61 | Temp 96.3°F | Resp 18 | Ht 68.0 in | Wt 181.0 lb

## 2024-06-24 DIAGNOSIS — M792 Neuralgia and neuritis, unspecified: Secondary | ICD-10-CM

## 2024-06-24 DIAGNOSIS — M542 Cervicalgia: Secondary | ICD-10-CM

## 2024-06-24 DIAGNOSIS — R2681 Unsteadiness on feet: Secondary | ICD-10-CM

## 2024-06-24 DIAGNOSIS — Z8673 Personal history of transient ischemic attack (TIA), and cerebral infarction without residual deficits: Secondary | ICD-10-CM

## 2024-06-24 DIAGNOSIS — R4702 Dysphasia: Secondary | ICD-10-CM

## 2024-06-24 DIAGNOSIS — R531 Weakness: Secondary | ICD-10-CM

## 2024-06-24 MED ORDER — FOLIC ACID 1 MG PO TABS: 1.0000 mg | ORAL_TABLET | Freq: Every day | ORAL | 0 refills | Status: AC

## 2024-06-24 MED ORDER — GABAPENTIN 100 MG PO CAPS: 100.0000 mg | ORAL_CAPSULE | Freq: Every day | ORAL | 1 refills | Status: AC

## 2024-07-08 ENCOUNTER — Ambulatory Visit: Admitting: Neurology

## 2024-07-14 ENCOUNTER — Ambulatory Visit: Admitting: Podiatry

## 2024-07-27 ENCOUNTER — Ambulatory Visit: Admitting: Family
